# Patient Record
Sex: Male | Born: 1935
Health system: Southern US, Community
[De-identification: ages and names within clinical notes are randomized; demographics above are authoritative.]

## PROBLEM LIST (undated history)

## (undated) DIAGNOSIS — I519 Heart disease, unspecified: Secondary | ICD-10-CM

## (undated) DIAGNOSIS — J189 Pneumonia, unspecified organism: Secondary | ICD-10-CM

## (undated) DIAGNOSIS — E0590 Thyrotoxicosis, unspecified without thyrotoxic crisis or storm: Secondary | ICD-10-CM

## (undated) DIAGNOSIS — K219 Gastro-esophageal reflux disease without esophagitis: Secondary | ICD-10-CM

## (undated) DIAGNOSIS — R001 Bradycardia, unspecified: Secondary | ICD-10-CM

## (undated) DIAGNOSIS — M1990 Unspecified osteoarthritis, unspecified site: Secondary | ICD-10-CM

## (undated) DIAGNOSIS — E119 Type 2 diabetes mellitus without complications: Secondary | ICD-10-CM

## (undated) DIAGNOSIS — I803 Phlebitis and thrombophlebitis of lower extremities, unspecified: Secondary | ICD-10-CM

## (undated) DIAGNOSIS — E1140 Type 2 diabetes mellitus with diabetic neuropathy, unspecified: Secondary | ICD-10-CM

## (undated) DIAGNOSIS — Z9289 Personal history of other medical treatment: Secondary | ICD-10-CM

## (undated) DIAGNOSIS — I350 Nonrheumatic aortic (valve) stenosis: Secondary | ICD-10-CM

## (undated) DIAGNOSIS — E785 Hyperlipidemia, unspecified: Secondary | ICD-10-CM

## (undated) DIAGNOSIS — I10 Essential (primary) hypertension: Secondary | ICD-10-CM

## (undated) DIAGNOSIS — R011 Cardiac murmur, unspecified: Secondary | ICD-10-CM

## (undated) DIAGNOSIS — I700 Atherosclerosis of aorta: Secondary | ICD-10-CM

## (undated) HISTORY — DX: Cardiac murmur, unspecified: R01.1

## (undated) HISTORY — DX: Essential (primary) hypertension: I10

## (undated) HISTORY — DX: Hyperlipidemia, unspecified: E78.5

## (undated) HISTORY — DX: Phlebitis and thrombophlebitis of lower extremities, unspecified: I80.3

## (undated) HISTORY — PX: FRACTURE SURGERY: SHX138

## (undated) HISTORY — DX: Thyrotoxicosis, unspecified without thyrotoxic crisis or storm: E05.90

## (undated) HISTORY — DX: Gastro-esophageal reflux disease without esophagitis: K21.9

## (undated) HISTORY — DX: Atherosclerosis of aorta: I70.0

## (undated) HISTORY — DX: Pneumonia, unspecified organism: J18.9

## (undated) HISTORY — DX: Nonrheumatic aortic (valve) stenosis: I35.0

## (undated) HISTORY — DX: Heart disease, unspecified: I51.9

## (undated) HISTORY — DX: Unspecified osteoarthritis, unspecified site: M19.90

## (undated) HISTORY — PX: CATARACT EXTRACTION: SUR2

## (undated) HISTORY — PX: CIRCUMCISION: SUR203

## (undated) HISTORY — DX: Personal history of other medical treatment: Z92.89

## (undated) HISTORY — DX: Bradycardia, unspecified: R00.1

## (undated) NOTE — *Deleted (*Deleted)
Progress Note  Patient Name: Jackson [name] Date of Encounter: 06/28/2020  Primary Cardiologist:  Sherryl Manges, MD  Subjective   ***  Inpatient Medications    Scheduled Meds: . aspirin EC  81 mg Oral Daily  . docusate sodium  100 mg Oral QHS  . fluticasone  2 spray Each Nare Daily  . gabapentin  300 mg Oral QHS  . insulin aspart  0-15 Units Subcutaneous TID WC  . insulin aspart  0-5 Units Subcutaneous QHS  . insulin glargine  15 Units Subcutaneous Q2200  . rosuvastatin  10 mg Oral QHS   Continuous Infusions: . azithromycin Stopped (06/27/20 1028)  . cefTRIAXone (ROCEPHIN)  IV Stopped (06/27/20 1002)   PRN Meds: albuterol, naphazoline-glycerin, ondansetron **OR** ondansetron (ZOFRAN) IV, perflutren lipid microspheres (DEFINITY) IV suspension, sodium chloride   Vital Signs    Vitals:   06/28/20 0030 06/28/20 0430 06/28/20 0828 06/28/20 0829  BP: (!) 143/70 (!) 143/70 (!) 146/40   Pulse: (!) 54 (!) 54 65   Resp:  16 (!) 21   Temp: 97.9 F (36.6 C) 97.9 F (36.6 C) 99.5 F (37.5 C) 98.1 F (36.7 C)  TempSrc: Oral Oral Oral Oral  SpO2: 94% 94% 92%   Weight:      Height:        Intake/Output Summary (Last 24 hours) at 06/28/2020 1056 Last data filed at 06/28/2020 1038 Gross per 24 hour  Intake 4814.23 ml  Output 500 ml  Net 4314.23 ml   Filed Weights   06/27/20 0935  Weight: 79.4 kg   Last Weight  Most recent update: 06/27/2020  9:36 AM   Weight  79.4 kg (175 lb)           Weight change:    Telemetry    *** - Personally Reviewed  ECG    *** - Personally Reviewed  Physical Exam   General: Well developed, well nourished, male appearing in no acute distress. Head: Normocephalic, atraumatic.  Neck: Supple without bruits, JVD***. Lungs:  Resp regular and unlabored, CTA. Heart: RRR***, S1, S2, no S3, S4, or murmur; no rub. Abdomen: Soft, non-tender, non-distended with normoactive bowel sounds. No hepatomegaly. No rebound/guarding. No obvious  abdominal masses. Extremities: No clubbing, cyanosis, ***edema. Distal pedal pulses are 2+ bilaterally. Neuro: Alert and oriented X 3. Moves all extremities spontaneously. Psych: Normal affect.  Labs    Hematology Recent Labs  Lab 06/27/20 0900 06/28/20 0457  WBC 9.4 11.9*  RBC 4.62 4.04*  HGB 14.4 12.8*  HCT 43.1 39.2  MCV 93.3 97.0  MCH 31.2 31.7  MCHC 33.4 32.7  RDW 12.8 12.9  PLT 214 181    Chemistry Recent Labs  Lab 06/27/20 0900 06/28/20 0457  NA 141 139  K 3.8 3.6  CL 101 100  CO2 27 28  GLUCOSE 219* 138*  BUN 14 14  CREATININE 1.02 0.96  CALCIUM 9.5 9.2  PROT 7.2 6.0*  ALBUMIN 3.9 3.2*  AST 20 15  ALT 18 14  ALKPHOS 69 48  BILITOT 0.9 0.8  GFRNONAA >60 >60  ANIONGAP 13 11     High Sensitivity Troponin:   Recent Labs  Lab 06/27/20 0900 06/27/20 1100 06/27/20 1505 06/27/20 1730  TROPONINIHS 80* 483* 657* 569*      BNPNo results for input(s): BNP, PROBNP in the last 168 hours.   DDimer No results for input(s): DDIMER in the last 168 hours.   Radiology    CT ANGIO CHEST PE W OR WO  CONTRAST  Result Date: 06/27/2020 CLINICAL DATA:  Several short of breath.  History of aspiration EXAM: CT ANGIOGRAPHY CHEST WITH CONTRAST TECHNIQUE: Multidetector CT imaging of the chest was performed using the standard protocol during bolus administration of intravenous contrast. Multiplanar CT image reconstructions and MIPs were obtained to evaluate the vascular anatomy. CONTRAST:  OMNIPAQUE IOHEXOL 350 MG/ML SOLN COMPARISON:  March 14, 2020 FINDINGS: Cardiovascular: Evaluation of the subsegmental pulmonary arteries is limited secondary to respiratory motion. No pulmonary embolism through the segmental pulmonary arteries. Heart is normal in size. Predominately LEFT-sided atherosclerotic calcifications of the coronary arteries. Moderate atherosclerotic calcifications of the aorta. No pericardial effusion. Calcifications of the aortic valve leaflets.  Mediastinum/Nodes: Moderate to large hiatal hernia, similar in comparison to prior. No suspicious axillary adenopathy. Similar appearance of mildly prominent mediastinal lymph nodes. Subcentimeter LEFT thyroid nodule. Lungs/Pleura: No pleural effusion or pneumothorax. There is centrilobular ground-glass nodularity and consolidative opacity throughout the RIGHT upper lobe, RIGHT middle lobe and RIGHT lower lobe in a perihilar predominant distribution. Resolution of previously seen contralateral centrilobular nodularity. Scattered pleural plaques. Scattered LEFT basilar atelectasis. Evaluation for fine parenchymal detail is limited secondary to extensive respiratory motion. Upper Abdomen: No acute abnormality. Musculoskeletal: Gynecomastia. Degenerative changes of the thoracic spine. Review of the MIP images confirms the above findings. IMPRESSION: 1. No evidence of pulmonary embolism through the segmental pulmonary arteries. 2. Multifocal centrilobular ground-glass nodularity and consolidative opacity throughout the RIGHT lung, most consistent with pneumonia. Recommend radiographic follow-up to resolution. 3. Moderate to large hiatal hernia. Aortic Atherosclerosis (ICD10-I70.0). Electronically Signed   By: Meda Klinefelter MD   On: 06/27/2020 13:23   DG Chest Port 1 View  Result Date: 06/27/2020 CLINICAL DATA:  Questionable sepsis, evaluate for abnormality EXAM: PORTABLE CHEST 1 VIEW COMPARISON:  03/14/2020 FINDINGS: Mild cardiomegaly. There is dense, heterogeneous airspace opacity of the right midlung, new compared to prior examination. The visualized skeletal structures are unremarkable. IMPRESSION: 1. There is dense, heterogeneous airspace opacity of the right midlung, new compared to prior examination, concerning for infection. Recommend follow-up radiographs in 6-8 weeks to ensure complete radiographic resolution. 2. Mild cardiomegaly. Electronically Signed   By: Lauralyn Primes M.D.   On: 06/27/2020 09:27      Cardiac Studies   ECHO:  Results pending  Patient Profile     26 y.o. male w/ hx 1st degree and 2nd degree Mobitz I heart block, CHB during acute illness w/ junctional bts (no PPM indicated), AS, DM, HTN,  low-risk MV 06/2018, DVT 09/2017, recurrent PNA, who was admitted 10/07 with PNA, cards saw for elevated troponin.  Assessment & Plan    1. Elevated troponin - in the setting of febrile illness, PNA w/ hypoxia - no ischemic sx - continue ASA, statin, no BB due to hx bradycardia - f/u on echo results  2. Bradycardia - has Wenkebach w/ long PR  Principal Problem:   Sepsis (HCC) Active Problems:   Elevated troponin    Melida Quitter , PA-C 10:56 AM 06/28/2020 Pager: [phone]

---

## 2002-12-11 ENCOUNTER — Ambulatory Visit (HOSPITAL_COMMUNITY): Admission: RE | Admit: 2002-12-11 | Discharge: 2002-12-11 | Payer: Self-pay | Admitting: Gastroenterology

## 2008-03-19 ENCOUNTER — Encounter: Admission: RE | Admit: 2008-03-19 | Discharge: 2008-03-19 | Payer: Self-pay | Admitting: Internal Medicine

## 2008-11-03 ENCOUNTER — Emergency Department (HOSPITAL_COMMUNITY): Admission: EM | Admit: 2008-11-03 | Discharge: 2008-11-03 | Payer: Self-pay | Admitting: Family Medicine

## 2008-11-30 ENCOUNTER — Ambulatory Visit (HOSPITAL_BASED_OUTPATIENT_CLINIC_OR_DEPARTMENT_OTHER): Admission: RE | Admit: 2008-11-30 | Discharge: 2008-11-30 | Payer: Self-pay | Admitting: Urology

## 2008-11-30 ENCOUNTER — Encounter (INDEPENDENT_AMBULATORY_CARE_PROVIDER_SITE_OTHER): Payer: Self-pay | Admitting: Urology

## 2010-07-26 ENCOUNTER — Emergency Department (HOSPITAL_COMMUNITY): Admission: EM | Admit: 2010-07-26 | Discharge: 2010-07-27 | Payer: Self-pay | Admitting: Emergency Medicine

## 2010-12-02 LAB — DIFFERENTIAL
Basophils Absolute: 0 10*3/uL (ref 0.0–0.1)
Basophils Relative: 0 % (ref 0–1)
Eosinophils Absolute: 0.1 10*3/uL (ref 0.0–0.7)
Monocytes Absolute: 0.7 10*3/uL (ref 0.1–1.0)
Monocytes Relative: 5 % (ref 3–12)
Neutrophils Relative %: 92 % — ABNORMAL HIGH (ref 43–77)

## 2010-12-02 LAB — URINE CULTURE
Colony Count: NO GROWTH
Culture: NO GROWTH

## 2010-12-02 LAB — URINALYSIS, ROUTINE W REFLEX MICROSCOPIC
Glucose, UA: NEGATIVE mg/dL
Hgb urine dipstick: NEGATIVE
Protein, ur: NEGATIVE mg/dL
Specific Gravity, Urine: 1.019 (ref 1.005–1.030)
pH: 5.5 (ref 5.0–8.0)

## 2010-12-02 LAB — BASIC METABOLIC PANEL
BUN: 17 mg/dL (ref 6–23)
CO2: 26 mEq/L (ref 19–32)
Calcium: 9.1 mg/dL (ref 8.4–10.5)
Glucose, Bld: 222 mg/dL — ABNORMAL HIGH (ref 70–99)
Sodium: 135 mEq/L (ref 135–145)

## 2010-12-02 LAB — CBC
HCT: 39.3 % (ref 39.0–52.0)
Hemoglobin: 13.4 g/dL (ref 13.0–17.0)
MCH: 30.5 pg (ref 26.0–34.0)
MCHC: 34.1 g/dL (ref 30.0–36.0)
RDW: 12.3 % (ref 11.5–15.5)

## 2011-01-01 LAB — BASIC METABOLIC PANEL
BUN: 17 mg/dL (ref 6–23)
Calcium: 9.4 mg/dL (ref 8.4–10.5)
GFR calc non Af Amer: 60 mL/min — ABNORMAL LOW (ref >60)
Glucose, Bld: 256 mg/dL — ABNORMAL HIGH (ref 70–99)

## 2011-01-06 LAB — POCT URINALYSIS DIP (DEVICE)
Protein, ur: NEGATIVE mg/dL
Specific Gravity, Urine: <=1.005 (ref 1.005–1.030)
Urobilinogen, UA: 0.2 mg/dL (ref 0.0–1.0)
pH: 6.5 (ref 5.0–8.0)

## 2011-02-03 NOTE — Op Note (Signed)
NAMEFAY, SWIDER               ACCOUNT NO.:  [id number]   MEDICAL RECORD NO.:  [id number]          PATIENT TYPE:  AMB   LOCATION:  [facility]                         FACILITY:  [facility]   PHYSICIAN:  Ronald L. Earlene Plater, M.D.  DATE OF BIRTH:  [birth year]   DATE OF PROCEDURE:  11/30/2008  DATE OF DISCHARGE:                               OPERATIVE REPORT   PREOPERATIVE DIAGNOSIS:  The patient has a diagnosis balanitis and  phimosis.   POSTOPERATIVE DIAGNOSIS:  The patient has a diagnosis balanitis and  phimosis.   OPERATIVE PROCEDURE:  Circumcision.   SURGEON:  Lucrezia Starch. Earlene Plater, M.D.   ANESTHESIA:  LMA.   BLOOD LOSS:  Negligible to none.   COMPLICATIONS:  None.   PROCEDURE:  The patient is a very nice 77 year old white male who  presented with significant balanitis and phimosis.  He is  diabetic.  After understanding risks, benefits and alternatives, elected proceed  with circumcision.  He has been cleared for surgery by his medical  doctor and wishes to proceed accordingly.   DESCRIPTION OF PROCEDURE:  The patient was placed in the supine  position.  After proper LMA anesthesia, was prepped and draped with  Betadine in a sterile fashion.  The dorsal slit was performed and the  glans and the preputial area were reprepped with Betadine solution.  A  circumferential incision made at the shaft skin down to the corpus  spongiosum and Buck's fascia.  A separate circumferential incision was  made approximately 2 mm proximal to the corona aerata and extended to  the same levels.  The foreskin was carefully excised utilizing both  blunt and sharp dissection and vessels were cauterized with Bovie  coagulation cautery.  The area was irrigated thoroughly and the shaft  skin was approximated to the mucosa.  A dorsal stitch was placed with 3-  0 chromic catgut and a ventral U-type stitch was placed also with 3-0  chromic catgut and each side was run with a running type suture.  Following this,  good hemostasis was present.  The area was washed, was  dressed with Vaseline gauze, 4x4 sponge and Coban.  A penile block was  then performed in the usual manner with 10 mL of 0.25% Marcaine.  The  patient tolerated the procedure well.  No complications.  He was taken  to the recovery room stable.      Ronald L. Earlene Plater, M.D.  Electronically Signed     RLD/MEDQ  D:  11/30/2008  T:  11/30/2008  Job:  [id number]

## 2011-02-06 NOTE — Op Note (Signed)
   NAME:  Jackson [name], Jackson [name]                         ACCOUNT NO.:  [id number]   MEDICAL RECORD NO.:  [id number]                   PATIENT TYPE:  AMB   LOCATION:  [facility]                                 FACILITY:  [facility]   PHYSICIAN:  Danise Edge, M.D.                DATE OF BIRTH:  Oct 21, 1928   DATE OF PROCEDURE:  12/11/2002  DATE OF DISCHARGE:                                 OPERATIVE REPORT   PROCEDURE:  Screening colonoscopy.   INDICATIONS FOR PROCEDURE:  Mr. Jackson [name] is a 77 year old male born  1929/04/03. Mr. Jackson [name] is scheduled to undergo his first screening  colonoscopy with polypectomy to prevent colon cancer.   ENDOSCOPIST:  Charolett Bumpers, M.D.   PREMEDICATION:  Versed 5 mg, Demerol 50 mg .   ENDOSCOPE:  Olympus adult colonoscope.   DESCRIPTION OF PROCEDURE:  After obtaining informed consent, Jackson [name] was  placed in the left lateral decubitus position. I administered intravenous  Demerol and intravenous Versed to achieve conscious sedation for the  procedure. The patient's blood pressure, oxygen saturation and cardiac  rhythm were monitored throughout the procedure and documented in the medical  record.   Anal inspection was normal. Digital rectal exam was normal. The Olympus  colonoscope was introduced into the rectum and advanced to the cecum.  Colonic preparation for the exam today was excellent.   RECTUM:  Normal.   SIGMOID COLON AND DESCENDING COLON:  Normal.   SPLENIC FLEXURE:  Normal.   TRANSVERSE COLON:  Normal.   HEPATIC FLEXURE:  Normal.   ASCENDING COLON:  Normal.   CECUM AND ILEOCECAL VALVE:  Normal.    ASSESSMENT:  Normal screening proctocolonoscopy to the cecum. No endoscopic  evidence for the presence of colorectal neoplasia.                                               Danise Edge, M.D.    MJ/MEDQ  D:  12/11/2002  T:  12/11/2002  Job:  [id number]   cc:   Theressa Millard, M.D.  [address] [place]  [place]  [address]  Fax: [phone]

## 2013-11-23 ENCOUNTER — Emergency Department (HOSPITAL_COMMUNITY)
Admission: EM | Admit: 2013-11-23 | Discharge: 2013-11-23 | Disposition: A | Discharge status: Home / Self Care | Payer: Medicare Other | Attending: Emergency Medicine | Admitting: Emergency Medicine

## 2013-11-23 ENCOUNTER — Emergency Department (HOSPITAL_COMMUNITY): Payer: Medicare Other

## 2013-11-23 ENCOUNTER — Encounter (HOSPITAL_COMMUNITY): Payer: Self-pay | Admitting: Emergency Medicine

## 2013-11-23 DIAGNOSIS — E1149 Type 2 diabetes mellitus with other diabetic neurological complication: Secondary | ICD-10-CM | POA: Insufficient documentation

## 2013-11-23 DIAGNOSIS — R509 Fever, unspecified: Secondary | ICD-10-CM | POA: Insufficient documentation

## 2013-11-23 DIAGNOSIS — J189 Pneumonia, unspecified organism: Secondary | ICD-10-CM | POA: Insufficient documentation

## 2013-11-23 DIAGNOSIS — F29 Unspecified psychosis not due to a substance or known physiological condition: Secondary | ICD-10-CM | POA: Insufficient documentation

## 2013-11-23 DIAGNOSIS — Z87891 Personal history of nicotine dependence: Secondary | ICD-10-CM | POA: Insufficient documentation

## 2013-11-23 DIAGNOSIS — E1142 Type 2 diabetes mellitus with diabetic polyneuropathy: Secondary | ICD-10-CM | POA: Insufficient documentation

## 2013-11-23 DIAGNOSIS — M129 Arthropathy, unspecified: Secondary | ICD-10-CM | POA: Insufficient documentation

## 2013-11-23 DIAGNOSIS — Z79899 Other long term (current) drug therapy: Secondary | ICD-10-CM | POA: Insufficient documentation

## 2013-11-23 DIAGNOSIS — IMO0001 Reserved for inherently not codable concepts without codable children: Secondary | ICD-10-CM | POA: Insufficient documentation

## 2013-11-23 HISTORY — DX: Type 2 diabetes mellitus with diabetic neuropathy, unspecified: E11.40

## 2013-11-23 HISTORY — DX: Type 2 diabetes mellitus without complications: E11.9

## 2013-11-23 LAB — CBC WITH DIFFERENTIAL/PLATELET
BASOS ABS: 0 10*3/uL (ref 0.0–0.1)
Basophils Relative: 0 % (ref 0–1)
Eosinophils Absolute: 0.1 10*3/uL (ref 0.0–0.7)
Eosinophils Relative: 1 % (ref 0–5)
HEMATOCRIT: 40.7 % (ref 39.0–52.0)
HEMOGLOBIN: 14.7 g/dL (ref 13.0–17.0)
LYMPHS PCT: 8 % — AB (ref 12–46)
Lymphs Abs: 0.9 10*3/uL (ref 0.7–4.0)
MCH: 32.7 pg (ref 26.0–34.0)
MCHC: 36.1 g/dL — ABNORMAL HIGH (ref 30.0–36.0)
MCV: 90.4 fL (ref 78.0–100.0)
MONO ABS: 0.6 10*3/uL (ref 0.1–1.0)
MONOS PCT: 5 % (ref 3–12)
NEUTROS ABS: 10.1 10*3/uL — AB (ref 1.7–7.7)
NEUTROS PCT: 86 % — AB (ref 43–77)
Platelets: 201 10*3/uL (ref 150–400)
RBC: 4.5 MIL/uL (ref 4.22–5.81)
RDW: 12.6 % (ref 11.5–15.5)
WBC: 11.7 10*3/uL — AB (ref 4.0–10.5)

## 2013-11-23 LAB — URINALYSIS, ROUTINE W REFLEX MICROSCOPIC
BILIRUBIN URINE: NEGATIVE
Glucose, UA: 250 mg/dL — AB
HGB URINE DIPSTICK: NEGATIVE
Ketones, ur: NEGATIVE mg/dL
Leukocytes, UA: NEGATIVE
NITRITE: NEGATIVE
PH: 6.5 (ref 5.0–8.0)
Protein, ur: NEGATIVE mg/dL
SPECIFIC GRAVITY, URINE: 1.021 (ref 1.005–1.030)
UROBILINOGEN UA: 0.2 mg/dL (ref 0.0–1.0)

## 2013-11-23 LAB — COMPREHENSIVE METABOLIC PANEL
ALBUMIN: 3.7 g/dL (ref 3.5–5.2)
ALK PHOS: 65 U/L (ref 39–117)
ALT: 13 U/L (ref 0–53)
AST: 12 U/L (ref 0–37)
BILIRUBIN TOTAL: 0.7 mg/dL (ref 0.3–1.2)
BUN: 18 mg/dL (ref 6–23)
CHLORIDE: 98 meq/L (ref 96–112)
CO2: 26 mEq/L (ref 19–32)
Calcium: 9.7 mg/dL (ref 8.4–10.5)
Creatinine, Ser: 1.21 mg/dL (ref 0.50–1.35)
GFR calc Af Amer: 62 mL/min — ABNORMAL LOW (ref >90)
GFR calc non Af Amer: 54 mL/min — ABNORMAL LOW (ref >90)
Glucose, Bld: 265 mg/dL — ABNORMAL HIGH (ref 70–99)
POTASSIUM: 4.1 meq/L (ref 3.7–5.3)
SODIUM: 139 meq/L (ref 137–147)
TOTAL PROTEIN: 6.9 g/dL (ref 6.0–8.3)

## 2013-11-23 LAB — LIPASE, BLOOD: LIPASE: 19 U/L (ref 11–59)

## 2013-11-23 MED ORDER — LEVOFLOXACIN 750 MG PO TABS
750.0000 mg | ORAL_TABLET | Freq: Once | ORAL | Status: AC
  Administered 2013-11-23: 750 mg via ORAL
  Filled 2013-11-23: qty 1

## 2013-11-23 MED ORDER — ONDANSETRON HCL 4 MG/2ML IJ SOLN
4.0000 mg | Freq: Once | INTRAMUSCULAR | Status: AC
  Administered 2013-11-23: 4 mg via INTRAVENOUS
  Filled 2013-11-23: qty 2

## 2013-11-23 MED ORDER — LEVOFLOXACIN 500 MG PO TABS: 500.0000 mg | ORAL_TABLET | Freq: Every day | ORAL | Status: DC

## 2013-11-23 MED ORDER — SODIUM CHLORIDE 0.9 % IV BOLUS (SEPSIS)
1000.0000 mL | Freq: Once | INTRAVENOUS | Status: AC
Start: 2013-11-23 — End: 2013-11-23
  Administered 2013-11-23: 1000 mL via INTRAVENOUS

## 2013-11-23 NOTE — ED Provider Notes (Signed)
CSN: [id number]     Arrival date & time 11/23/13  0701 History   First MD Initiated Contact with Patient 11/23/13 [phone]     Chief Complaint  Patient presents with  . Emesis     (Consider location/radiation/quality/duration/timing/severity/associated sxs/prior Treatment) HPI  This is an 80 year old male with a history of diabetes who presents with nausea and chills. Patient states that he woke up at approximately 4 AM this morning "shaking all over." He states that he went to the bathroom and had one episode of nonbilious, nonbloody emesis. He reports nausea. He denies any abdominal pain, chest pain, or shortness of breath. Patient's wife states that he was disoriented during this time. He has not taken Motrin or Tylenol.  Patient states that he was feeling well before he went to bed last night. He did get a flu shot this year. No known sick contacts. Per EMS, patient was 91% on room air, 97 percent on 2 L nasal cannula. Temperature noted to be 101.1 in triage. Patient denies any headache, neck stiffness, urinary symptoms, focal weakness or numbness.  Past Medical History  Diagnosis Date  . Diabetes mellitus without complication   . Arthritis   . Diabetic neuropathy    History reviewed. No pertinent past surgical history. No family history on file. History  Substance Use Topics  . Smoking status: Former Games developermoker  . Smokeless tobacco: Not on file  . Alcohol Use: No    Review of Systems  Constitutional: Positive for fever and chills.  Respiratory: Negative.  Negative for cough, chest tightness and shortness of breath.   Cardiovascular: Negative.  Negative for chest pain and leg swelling.  Gastrointestinal: Positive for nausea and vomiting. Negative for abdominal pain, diarrhea and blood in stool.  Genitourinary: Negative.  Negative for dysuria.  Musculoskeletal: Positive for myalgias. Negative for back pain.  Skin: Negative for rash.  Neurological: Negative for headaches.   Psychiatric/Behavioral: Positive for confusion.  All other systems reviewed and are negative.      Allergies  Review of patient's allergies indicates no known allergies.  Home Medications   Current Outpatient Rx  Name  Route  Sig  Dispense  Refill  . Cholecalciferol (VITAMIN D PO)   Oral   Take 1 capsule by mouth daily.         Marland Kitchen. gabapentin (NEURONTIN) 100 MG capsule   Oral   Take 100-200 mg by mouth 2 (two) times daily. Takes 1 capsule every morning and takes 2 capsules every night at bedtime.         Marland Kitchen. glimepiride (AMARYL) 4 MG tablet   Oral   Take 8 mg by mouth daily with breakfast.         . naproxen sodium (ANAPROX) 220 MG tablet   Oral   Take 440 mg by mouth every morning.         . simvastatin (ZOCOR) 40 MG tablet   Oral   Take 40 mg by mouth daily.         . sitaGLIPtin (JANUVIA) 100 MG tablet   Oral   Take 100 mg by mouth at bedtime.         Marland Kitchen. levofloxacin (LEVAQUIN) 500 MG tablet   Oral   Take 1 tablet (500 mg total) by mouth daily.   6 tablet   0    BP 134/67  Pulse 93  Temp(Src) 100 F (37.8 C) (Oral)  Resp 18  SpO2 94% Physical Exam  Nursing note and vitals reviewed.  Constitutional: He is oriented to person, place, and time. No distress.  Ill-appearing, nontoxic  HENT:  Head: Normocephalic and atraumatic.  Mouth/Throat: Oropharynx is clear and moist. No oropharyngeal exudate.  Eyes: Pupils are equal, round, and reactive to light.  Neck: Neck supple. No JVD present.  Cardiovascular: Normal rate, regular rhythm and normal heart sounds.   No murmur heard. Pulmonary/Chest: Effort normal and breath sounds normal. No respiratory distress. He has no wheezes. He has no rales.  Abdominal: Soft. Bowel sounds are normal. He exhibits no distension. There is no tenderness. There is no rebound and no guarding.  Musculoskeletal: He exhibits no edema.  Lymphadenopathy:    He has no cervical adenopathy.  Neurological: He is alert and  oriented to person, place, and time.  Skin: Skin is warm and dry. No rash noted.  Psychiatric: He has a normal mood and affect.    ED Course  Procedures (including critical care time) Labs Review Labs Reviewed  CBC WITH DIFFERENTIAL - Abnormal; Notable for the following:    WBC 11.7 (*)    MCHC 36.1 (*)    Neutrophils Relative % 86 (*)    Neutro Abs 10.1 (*)    Lymphocytes Relative 8 (*)    All other components within normal limits  URINALYSIS, ROUTINE W REFLEX MICROSCOPIC - Abnormal; Notable for the following:    Glucose, UA 250 (*)    All other components within normal limits  COMPREHENSIVE METABOLIC PANEL - Abnormal; Notable for the following:    Glucose, Bld 265 (*)    GFR calc non Af Amer 54 (*)    GFR calc Af Amer 62 (*)    All other components within normal limits  LIPASE, BLOOD   Imaging Review Dg Chest 2 View  11/23/2013   CLINICAL DATA:  Vomiting and 4 a.m.  Chills, fever  EXAM: CHEST  2 VIEW  COMPARISON:  None.  FINDINGS: There is eventration of the right diaphragm. There is lingular airspace disease. There is no pleural effusion or pneumothorax. The heart and mediastinal contours are unremarkable.  There is mild thoracic spine spondylosis.  IMPRESSION: Lingular pneumonia. Recommend followup radiography in 4-6 weeks, to document complete resolution following adequate medical therapy. If there is not complete resolution, then recommend further evaluation with CT of the chest to exclude underlying pathology.   Electronically Signed   By: Elige Ko   On: 11/23/2013 07:49     EKG Interpretation None     EKG independently reviewed by myself: Normal sinus rhythm with a rate of 85, no evidence of ST elevation or acute ischemia MDM   Final diagnoses:  Community acquired pneumonia    Patient presents with emesis and chills. He is nontoxic on exam. Initial vital signs in triage notable for room air O2 sats of 91%; however, patient is satting 95-96% on room air while in  the room. His exam is benign. Workup notable for lingular pneumonia. Patient has no risk factors for hospital-acquired pneumonia. Patient was given Levaquin. He was also given fluids and reports "I feel much better."  White count mildly elevated at 11.7. Patient was ambulated on room air and maintained oxygen saturations greater than 94%. I feel a trial of outpatient management for community acquired pneumonia is reasonable. Patient will be discharged on Levaquin. He is to followup with his primary physician.  After history, exam, and medical workup I feel the patient has been appropriately medically screened and is safe for discharge home. Pertinent diagnoses were discussed  with the patient. Patient was given return precautions.     Shon Baton, MD 11/23/13 [phone]

## 2013-11-23 NOTE — ED Notes (Signed)
Pt undressed, in gown, on monitor, continuous pulse oximetry and blood pressure cuff 

## 2013-11-23 NOTE — ED Notes (Signed)
PER EMS: pt from home, pt reports he woke up this morning at 4am shaking and trouble walking, pt reports vomiting x1, nausea.  BP-134/68, HR-100, O2-91% on RA and 97% 2L [place], lungs clear but diminished on right per EMS. RR-16, Temp 101.1. CBG-248.

## 2013-11-23 NOTE — ED Notes (Signed)
Patient ambulated independently with no problems.  Patient oxygen saturation maintained at 94-96%.  Patient has no other complaints at this time.

## 2013-11-23 NOTE — Discharge Instructions (Signed)

## 2013-12-25 ENCOUNTER — Ambulatory Visit
Admission: RE | Admit: 2013-12-25 | Discharge: 2013-12-25 | Disposition: A | Discharge status: Home / Self Care | Payer: Medicare Other | Source: Ambulatory Visit | Attending: Internal Medicine | Admitting: Internal Medicine

## 2013-12-25 ENCOUNTER — Other Ambulatory Visit: Payer: Self-pay | Admitting: Internal Medicine

## 2013-12-25 DIAGNOSIS — J189 Pneumonia, unspecified organism: Secondary | ICD-10-CM

## 2014-08-06 IMAGING — CR DG CHEST 2V
2 series · 2 of 2 positions shown · non-contrast
Comparison: 11/23/2013

CLINICAL DATA: Followup pneumonia

EXAM:
CHEST  2 VIEW

[w chest pa]
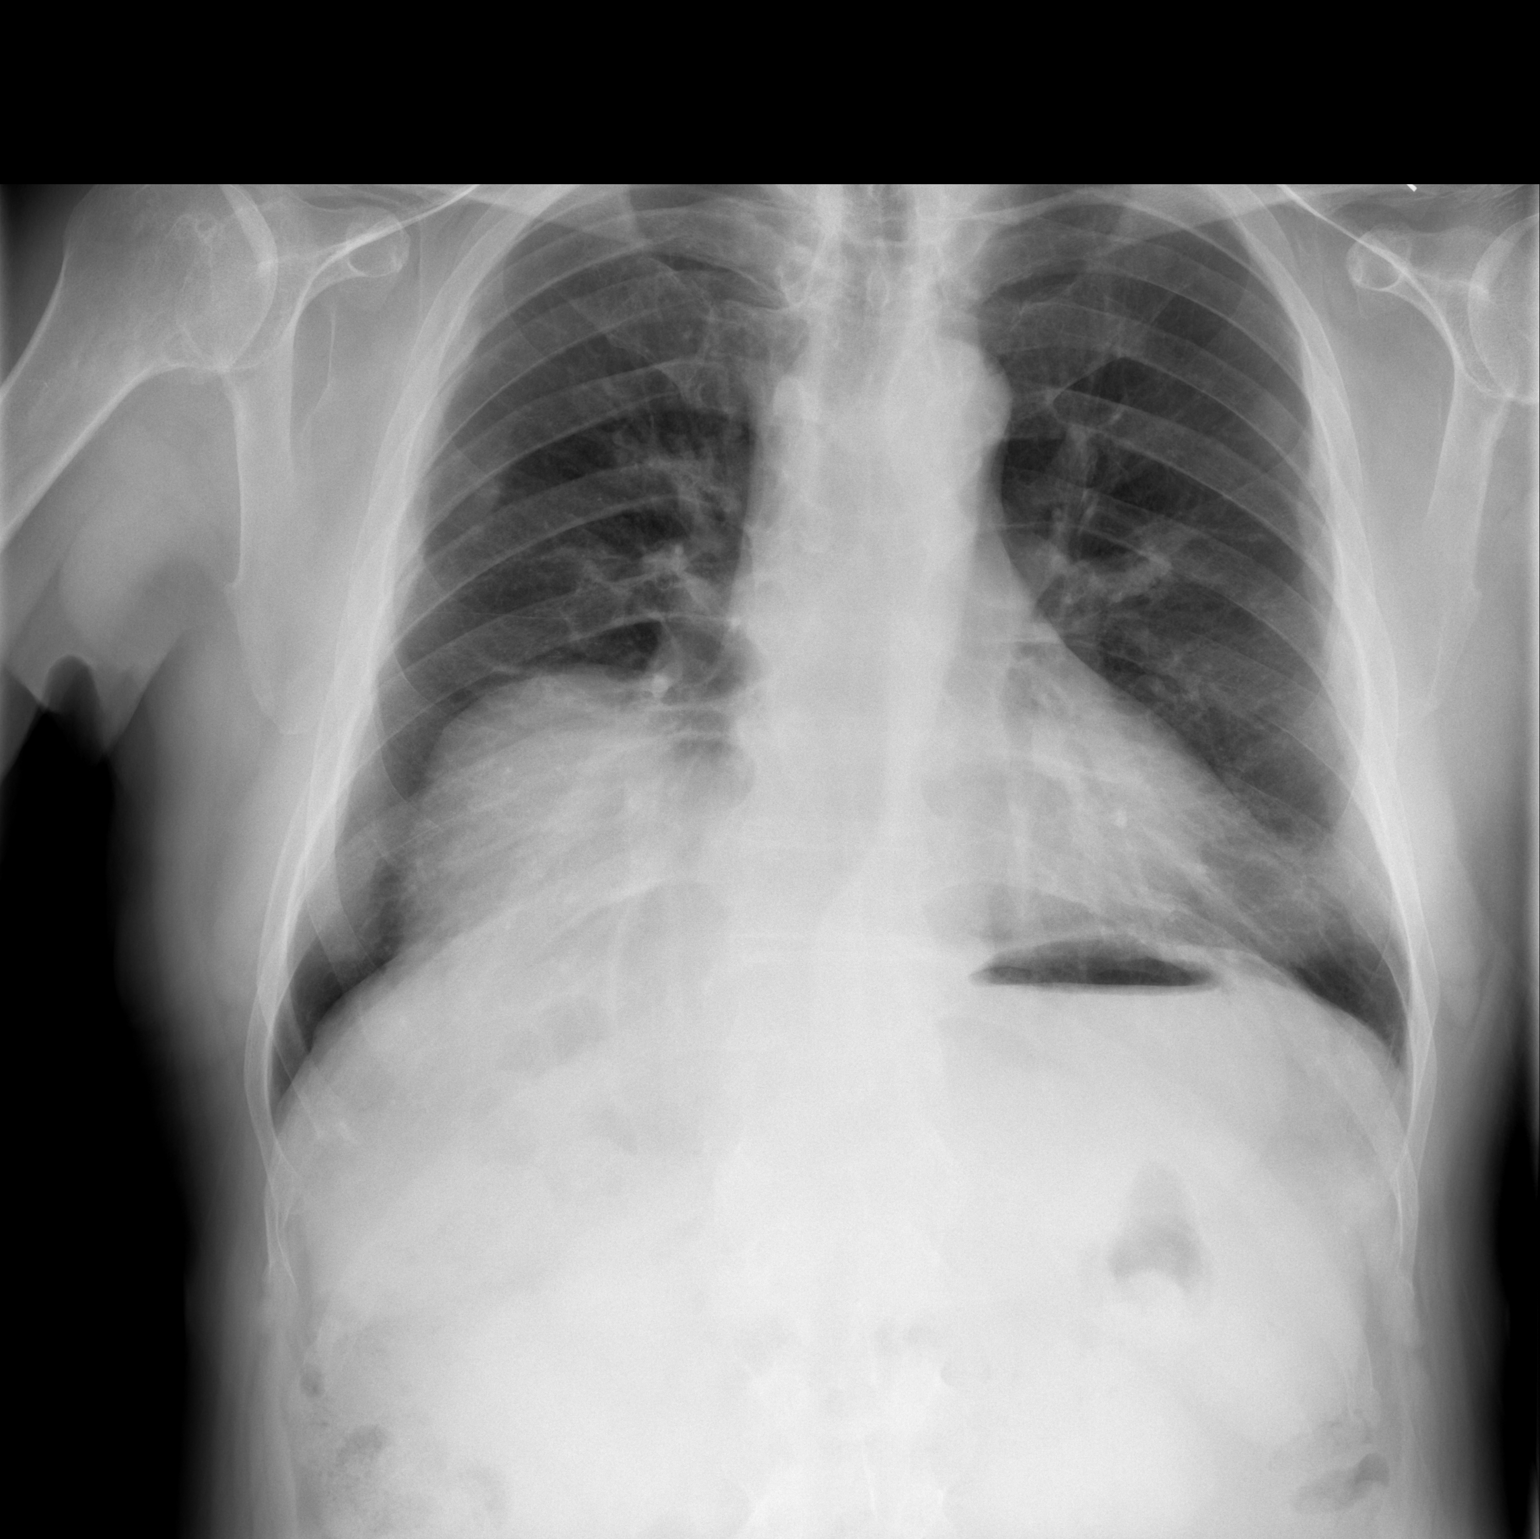

[w chest lat]
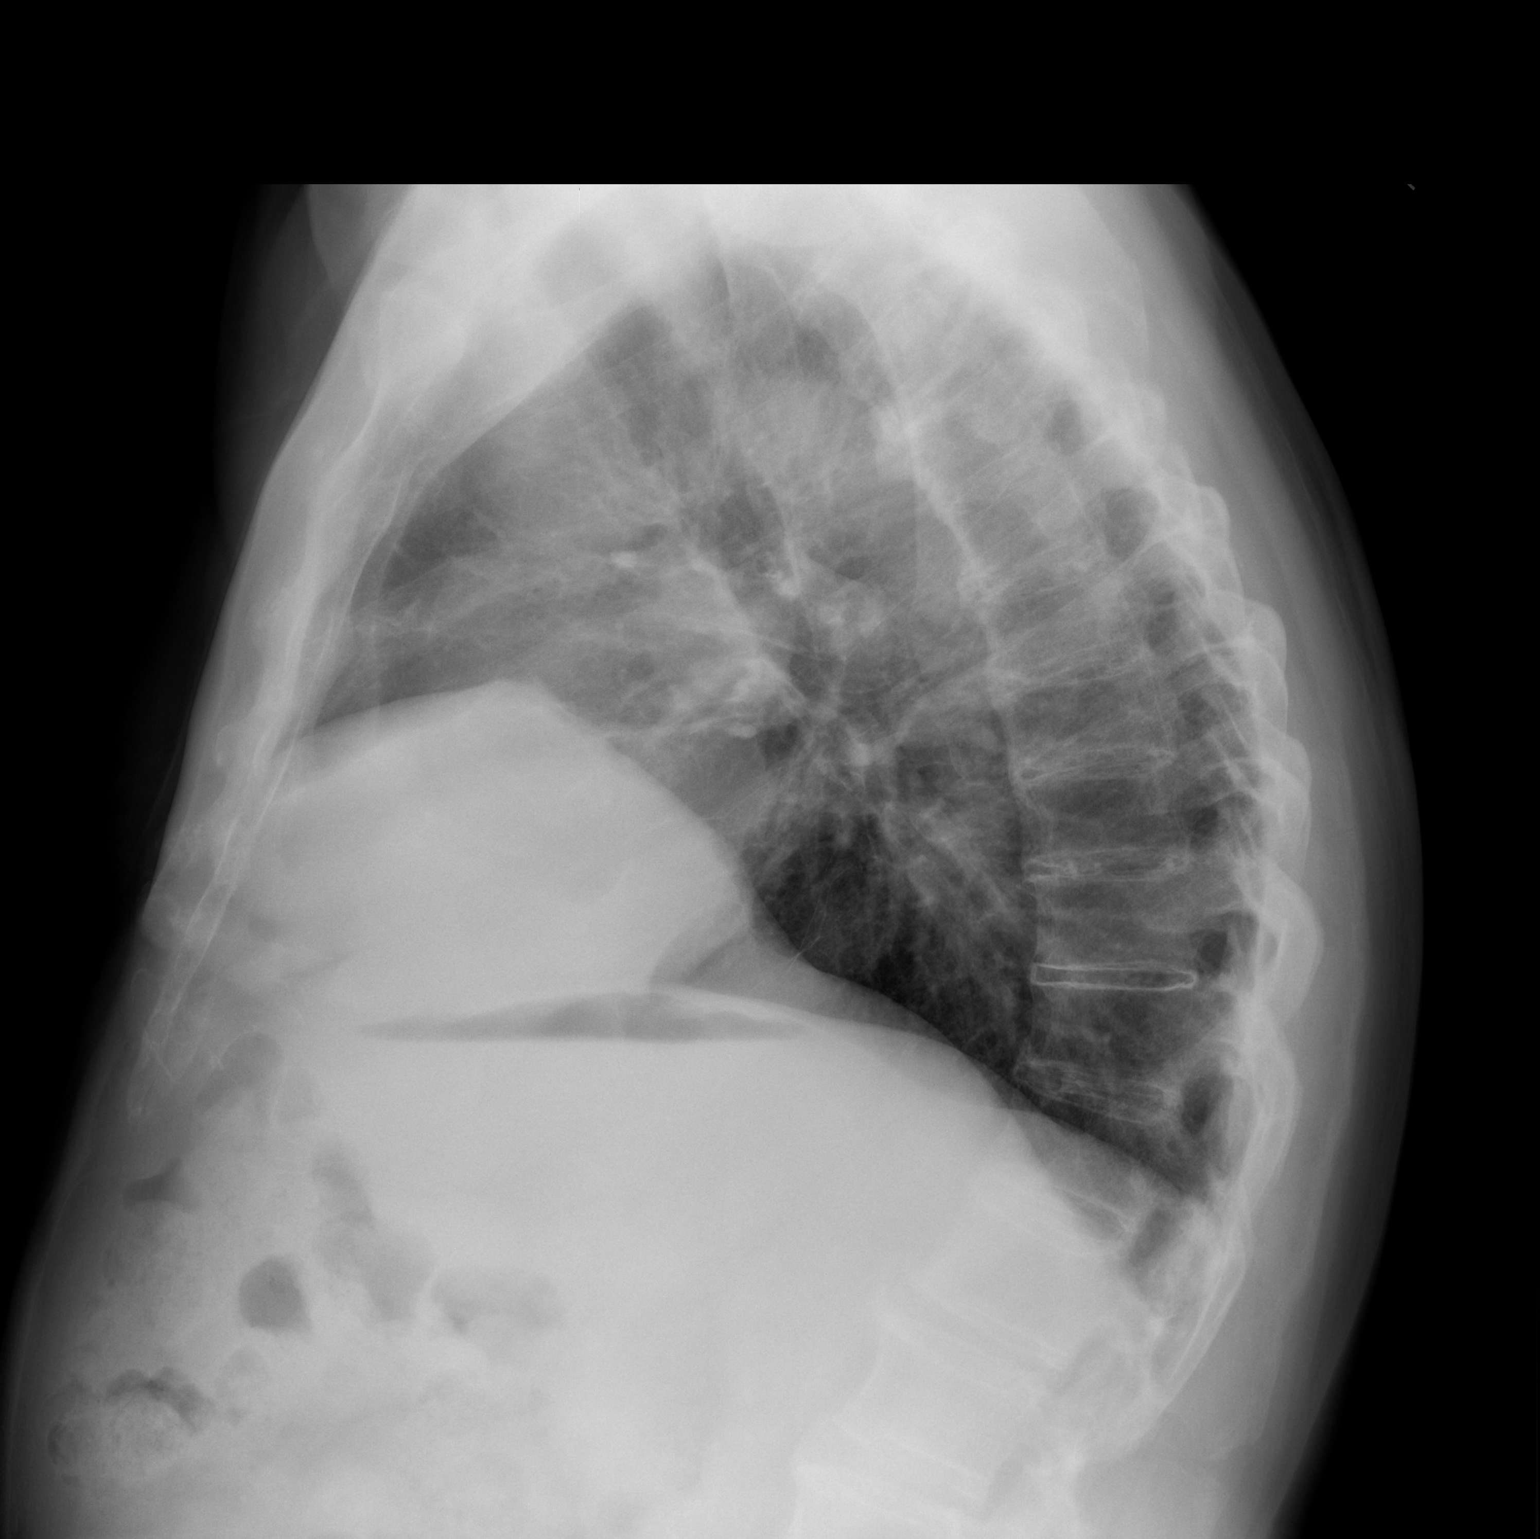

[2 of 2 positions shown; findings below may reference images not displayed]

FINDINGS: Interval clearing of lingular infiltrate consistent with pneumonia.
No underlying mass lesion. No effusion. Focal eventration of the
right hemidiaphragm anteriorly is unchanged. Negative for heart
failure.
IMPRESSION: Interval clearing of lingular pneumonia. Currently, no acute
abnormality is identified.

## 2014-09-11 ENCOUNTER — Ambulatory Visit
Admission: RE | Admit: 2014-09-11 | Discharge: 2014-09-11 | Disposition: A | Discharge status: Home / Self Care | Payer: Medicare Other | Source: Ambulatory Visit | Attending: Internal Medicine | Admitting: Internal Medicine

## 2014-09-11 ENCOUNTER — Other Ambulatory Visit: Payer: Self-pay | Admitting: Internal Medicine

## 2014-09-11 DIAGNOSIS — M25552 Pain in left hip: Secondary | ICD-10-CM

## 2015-02-20 DIAGNOSIS — J189 Pneumonia, unspecified organism: Secondary | ICD-10-CM

## 2015-02-20 HISTORY — DX: Pneumonia, unspecified organism: J18.9

## 2015-02-25 ENCOUNTER — Inpatient Hospital Stay (HOSPITAL_COMMUNITY)
Admission: EM | Admit: 2015-02-25 | Discharge: 2015-02-28 | DRG: 871 | Disposition: A | Discharge status: Home Health | Payer: Medicare Other | Attending: Internal Medicine | Admitting: Internal Medicine

## 2015-02-25 ENCOUNTER — Emergency Department (HOSPITAL_COMMUNITY): Payer: Medicare Other

## 2015-02-25 ENCOUNTER — Encounter (HOSPITAL_COMMUNITY): Payer: Self-pay | Admitting: *Deleted

## 2015-02-25 DIAGNOSIS — J9601 Acute respiratory failure with hypoxia: Secondary | ICD-10-CM | POA: Diagnosis present

## 2015-02-25 DIAGNOSIS — R001 Bradycardia, unspecified: Secondary | ICD-10-CM | POA: Diagnosis present

## 2015-02-25 DIAGNOSIS — Z66 Do not resuscitate: Secondary | ICD-10-CM | POA: Diagnosis present

## 2015-02-25 DIAGNOSIS — J189 Pneumonia, unspecified organism: Secondary | ICD-10-CM | POA: Diagnosis present

## 2015-02-25 DIAGNOSIS — E1165 Type 2 diabetes mellitus with hyperglycemia: Secondary | ICD-10-CM | POA: Diagnosis present

## 2015-02-25 DIAGNOSIS — R197 Diarrhea, unspecified: Secondary | ICD-10-CM | POA: Diagnosis present

## 2015-02-25 DIAGNOSIS — K219 Gastro-esophageal reflux disease without esophagitis: Secondary | ICD-10-CM | POA: Diagnosis present

## 2015-02-25 DIAGNOSIS — J811 Chronic pulmonary edema: Secondary | ICD-10-CM | POA: Diagnosis present

## 2015-02-25 DIAGNOSIS — A419 Sepsis, unspecified organism: Secondary | ICD-10-CM | POA: Diagnosis not present

## 2015-02-25 DIAGNOSIS — E872 Acidosis, unspecified: Secondary | ICD-10-CM | POA: Diagnosis present

## 2015-02-25 DIAGNOSIS — R0902 Hypoxemia: Secondary | ICD-10-CM

## 2015-02-25 DIAGNOSIS — Z87891 Personal history of nicotine dependence: Secondary | ICD-10-CM

## 2015-02-25 DIAGNOSIS — E1140 Type 2 diabetes mellitus with diabetic neuropathy, unspecified: Secondary | ICD-10-CM | POA: Diagnosis present

## 2015-02-25 DIAGNOSIS — J9600 Acute respiratory failure, unspecified whether with hypoxia or hypercapnia: Secondary | ICD-10-CM

## 2015-02-25 DIAGNOSIS — E119 Type 2 diabetes mellitus without complications: Secondary | ICD-10-CM | POA: Diagnosis not present

## 2015-02-25 DIAGNOSIS — J9621 Acute and chronic respiratory failure with hypoxia: Secondary | ICD-10-CM | POA: Insufficient documentation

## 2015-02-25 DIAGNOSIS — R0600 Dyspnea, unspecified: Secondary | ICD-10-CM | POA: Diagnosis not present

## 2015-02-25 DIAGNOSIS — M1990 Unspecified osteoarthritis, unspecified site: Secondary | ICD-10-CM | POA: Diagnosis present

## 2015-02-25 DIAGNOSIS — Z833 Family history of diabetes mellitus: Secondary | ICD-10-CM | POA: Diagnosis not present

## 2015-02-25 DIAGNOSIS — R112 Nausea with vomiting, unspecified: Secondary | ICD-10-CM | POA: Diagnosis present

## 2015-02-25 DIAGNOSIS — Z888 Allergy status to other drugs, medicaments and biological substances status: Secondary | ICD-10-CM | POA: Diagnosis not present

## 2015-02-25 DIAGNOSIS — Z79899 Other long term (current) drug therapy: Secondary | ICD-10-CM

## 2015-02-25 DIAGNOSIS — R0602 Shortness of breath: Secondary | ICD-10-CM | POA: Insufficient documentation

## 2015-02-25 DIAGNOSIS — E118 Type 2 diabetes mellitus with unspecified complications: Secondary | ICD-10-CM | POA: Diagnosis not present

## 2015-02-25 DIAGNOSIS — Z7982 Long term (current) use of aspirin: Secondary | ICD-10-CM

## 2015-02-25 DIAGNOSIS — R6520 Severe sepsis without septic shock: Secondary | ICD-10-CM | POA: Diagnosis present

## 2015-02-25 LAB — COMPREHENSIVE METABOLIC PANEL
ALBUMIN: 3.7 g/dL (ref 3.5–5.0)
ALK PHOS: 61 U/L (ref 38–126)
ALT: 22 U/L (ref 17–63)
AST: 22 U/L (ref 15–41)
Anion gap: 13 (ref 5–15)
BILIRUBIN TOTAL: 0.9 mg/dL (ref 0.3–1.2)
BUN: 19 mg/dL (ref 6–20)
CHLORIDE: 100 mmol/L — AB (ref 101–111)
CO2: 24 mmol/L (ref 22–32)
Calcium: 9.2 mg/dL (ref 8.9–10.3)
Creatinine, Ser: 1.24 mg/dL (ref 0.61–1.24)
GFR calc non Af Amer: 52 mL/min — ABNORMAL LOW (ref >60)
GFR, EST AFRICAN AMERICAN: 60 mL/min — AB (ref >60)
GLUCOSE: 221 mg/dL — AB (ref 65–99)
Potassium: 4.2 mmol/L (ref 3.5–5.1)
Sodium: 137 mmol/L (ref 135–145)
Total Protein: 6.7 g/dL (ref 6.5–8.1)

## 2015-02-25 LAB — CBC WITH DIFFERENTIAL/PLATELET
BASOS ABS: 0 10*3/uL (ref 0.0–0.1)
Basophils Relative: 0 % (ref 0–1)
EOS ABS: 0.1 10*3/uL (ref 0.0–0.7)
EOS PCT: 2 % (ref 0–5)
HEMATOCRIT: 42 % (ref 39.0–52.0)
HEMOGLOBIN: 14.9 g/dL (ref 13.0–17.0)
Lymphocytes Relative: 13 % (ref 12–46)
Lymphs Abs: 0.8 10*3/uL (ref 0.7–4.0)
MCH: 32.5 pg (ref 26.0–34.0)
MCHC: 35.5 g/dL (ref 30.0–36.0)
MCV: 91.5 fL (ref 78.0–100.0)
MONOS PCT: 7 % (ref 3–12)
Monocytes Absolute: 0.4 10*3/uL (ref 0.1–1.0)
Neutro Abs: 5.1 10*3/uL (ref 1.7–7.7)
Neutrophils Relative %: 78 % — ABNORMAL HIGH (ref 43–77)
Platelets: 180 10*3/uL (ref 150–400)
RBC: 4.59 MIL/uL (ref 4.22–5.81)
RDW: 12.7 % (ref 11.5–15.5)
WBC: 6.5 10*3/uL (ref 4.0–10.5)

## 2015-02-25 LAB — INFLUENZA PANEL BY PCR (TYPE A & B)
H1N1 flu by pcr: NOT DETECTED
INFLAPCR: NEGATIVE
Influenza B By PCR: NEGATIVE

## 2015-02-25 LAB — MRSA PCR SCREENING: MRSA by PCR: NEGATIVE

## 2015-02-25 LAB — STREP PNEUMONIAE URINARY ANTIGEN: Strep Pneumo Urinary Antigen: NEGATIVE

## 2015-02-25 LAB — GLUCOSE, CAPILLARY: Glucose-Capillary: 180 mg/dL — ABNORMAL HIGH (ref 65–99)

## 2015-02-25 LAB — URINALYSIS, ROUTINE W REFLEX MICROSCOPIC
Bilirubin Urine: NEGATIVE
Glucose, UA: 100 mg/dL — AB
Ketones, ur: NEGATIVE mg/dL
Leukocytes, UA: NEGATIVE
Nitrite: NEGATIVE
PH: 5 (ref 5.0–8.0)
PROTEIN: 30 mg/dL — AB
Specific Gravity, Urine: 1.012 (ref 1.005–1.030)
Urobilinogen, UA: 0.2 mg/dL (ref 0.0–1.0)

## 2015-02-25 LAB — URINE MICROSCOPIC-ADD ON

## 2015-02-25 LAB — LIPASE, BLOOD: Lipase: 17 U/L — ABNORMAL LOW (ref 22–51)

## 2015-02-25 LAB — PROCALCITONIN: PROCALCITONIN: 19.45 ng/mL

## 2015-02-25 LAB — I-STAT CG4 LACTIC ACID, ED
LACTIC ACID, VENOUS: 1.6 mmol/L (ref 0.5–2.0)
Lactic Acid, Venous: 3.05 mmol/L (ref 0.5–2.0)

## 2015-02-25 LAB — I-STAT TROPONIN, ED: Troponin i, poc: 0.02 ng/mL (ref 0.00–0.08)

## 2015-02-25 LAB — PROTIME-INR
INR: 1.01 (ref 0.00–1.49)
INR: 1.18 (ref 0.00–1.49)
Prothrombin Time: 13.5 seconds (ref 11.6–15.2)
Prothrombin Time: 15.2 seconds (ref 11.6–15.2)

## 2015-02-25 LAB — CBG MONITORING, ED: GLUCOSE-CAPILLARY: 224 mg/dL — AB (ref 65–99)

## 2015-02-25 LAB — APTT: aPTT: 29 seconds (ref 24–37)

## 2015-02-25 MED ORDER — GUAIFENESIN-DM 100-10 MG/5ML PO SYRP
5.0000 mL | ORAL_SOLUTION | ORAL | Status: DC | PRN
  Filled 2015-02-25: qty 5

## 2015-02-25 MED ORDER — SODIUM CHLORIDE 0.9 % IJ SOLN
3.0000 mL | Freq: Two times a day (BID) | INTRAMUSCULAR | Status: DC
  Administered 2015-02-25 – 2015-02-28 (×5): 3 mL via INTRAVENOUS

## 2015-02-25 MED ORDER — ACETAMINOPHEN 650 MG RE SUPP: 650.0000 mg | Freq: Four times a day (QID) | RECTAL | Status: DC | PRN

## 2015-02-25 MED ORDER — SODIUM CHLORIDE 0.9 % IV BOLUS (SEPSIS)
500.0000 mL | INTRAVENOUS | Status: AC
  Administered 2015-02-25: 500 mL via INTRAVENOUS

## 2015-02-25 MED ORDER — PROMETHAZINE HCL 25 MG PO TABS
12.5000 mg | ORAL_TABLET | Freq: Four times a day (QID) | ORAL | Status: DC | PRN
  Filled 2015-02-25: qty 1

## 2015-02-25 MED ORDER — SODIUM CHLORIDE 0.9 % IV SOLN: 250.0000 mL | INTRAVENOUS | Status: DC | PRN

## 2015-02-25 MED ORDER — ACETAMINOPHEN 325 MG PO TABS
650.0000 mg | ORAL_TABLET | Freq: Four times a day (QID) | ORAL | Status: DC | PRN
  Filled 2015-02-25: qty 2

## 2015-02-25 MED ORDER — PIPERACILLIN-TAZOBACTAM 3.375 G IVPB
3.3750 g | Freq: Three times a day (TID) | INTRAVENOUS | Status: DC
  Administered 2015-02-25 – 2015-02-27 (×6): 3.375 g via INTRAVENOUS
  Filled 2015-02-25 (×9): qty 50

## 2015-02-25 MED ORDER — SODIUM CHLORIDE 0.9 % IV SOLN
500.0000 mg | Freq: Once | INTRAVENOUS | Status: AC
  Administered 2015-02-25: 500 mg via INTRAVENOUS
  Filled 2015-02-25: qty 500

## 2015-02-25 MED ORDER — DICLOFENAC POTASSIUM 50 MG PO TABS
50.0000 mg | ORAL_TABLET | Freq: Two times a day (BID) | ORAL | Status: DC
  Administered 2015-02-25: 50 mg via ORAL
  Filled 2015-02-25 (×4): qty 1

## 2015-02-25 MED ORDER — NAPHAZOLINE-PHENIRAMINE 0.025-0.3 % OP SOLN
1.0000 [drp] | Freq: Four times a day (QID) | OPHTHALMIC | Status: DC | PRN
  Filled 2015-02-25: qty 5

## 2015-02-25 MED ORDER — SODIUM CHLORIDE 0.9 % IV SOLN
1000.0000 mL | INTRAVENOUS | Status: DC
  Administered 2015-02-25: 1000 mL via INTRAVENOUS

## 2015-02-25 MED ORDER — ASPIRIN EC 81 MG PO TBEC
81.0000 mg | DELAYED_RELEASE_TABLET | Freq: Every day | ORAL | Status: DC
  Administered 2015-02-25 – 2015-02-28 (×4): 81 mg via ORAL
  Filled 2015-02-25 (×4): qty 1

## 2015-02-25 MED ORDER — SIMVASTATIN 40 MG PO TABS
40.0000 mg | ORAL_TABLET | Freq: Every day | ORAL | Status: DC
  Administered 2015-02-25 – 2015-02-28 (×4): 40 mg via ORAL
  Filled 2015-02-25 (×4): qty 1

## 2015-02-25 MED ORDER — VANCOMYCIN HCL IN DEXTROSE 1-5 GM/200ML-% IV SOLN
1000.0000 mg | Freq: Once | INTRAVENOUS | Status: AC
  Administered 2015-02-25: 1000 mg via INTRAVENOUS
  Filled 2015-02-25: qty 200

## 2015-02-25 MED ORDER — ENOXAPARIN SODIUM 40 MG/0.4ML ~~LOC~~ SOLN
40.0000 mg | SUBCUTANEOUS | Status: DC
  Administered 2015-02-25 – 2015-02-27 (×3): 40 mg via SUBCUTANEOUS
  Filled 2015-02-25 (×4): qty 0.4

## 2015-02-25 MED ORDER — SODIUM CHLORIDE 0.9 % IJ SOLN: 3.0000 mL | INTRAMUSCULAR | Status: DC | PRN

## 2015-02-25 MED ORDER — GUAIFENESIN ER 600 MG PO TB12
1200.0000 mg | ORAL_TABLET | Freq: Two times a day (BID) | ORAL | Status: DC
  Administered 2015-02-25 – 2015-02-28 (×7): 1200 mg via ORAL
  Filled 2015-02-25 (×8): qty 2

## 2015-02-25 MED ORDER — INSULIN ASPART 100 UNIT/ML ~~LOC~~ SOLN
0.0000 [IU] | Freq: Three times a day (TID) | SUBCUTANEOUS | Status: DC
  Administered 2015-02-25: 3 [IU] via SUBCUTANEOUS
  Administered 2015-02-25: 5 [IU] via SUBCUTANEOUS
  Administered 2015-02-26 (×2): 3 [IU] via SUBCUTANEOUS
  Administered 2015-02-27 (×2): 5 [IU] via SUBCUTANEOUS
  Administered 2015-02-27 – 2015-02-28 (×2): 2 [IU] via SUBCUTANEOUS
  Administered 2015-02-28: 3 [IU] via SUBCUTANEOUS
  Filled 2015-02-25 (×2): qty 1

## 2015-02-25 MED ORDER — IPRATROPIUM-ALBUTEROL 0.5-2.5 (3) MG/3ML IN SOLN: 3.0000 mL | RESPIRATORY_TRACT | Status: DC | PRN

## 2015-02-25 MED ORDER — INSULIN GLARGINE 100 UNIT/ML ~~LOC~~ SOLN
10.0000 [IU] | Freq: Every day | SUBCUTANEOUS | Status: DC
Start: 2015-02-25 — End: 2015-02-28
  Administered 2015-02-25 – 2015-02-27 (×3): 10 [IU] via SUBCUTANEOUS
  Filled 2015-02-25 (×4): qty 0.1

## 2015-02-25 MED ORDER — SODIUM CHLORIDE 0.9 % IJ SOLN
3.0000 mL | Freq: Two times a day (BID) | INTRAMUSCULAR | Status: DC
  Administered 2015-02-25 – 2015-02-28 (×7): 3 mL via INTRAVENOUS
  Filled 2015-02-25: qty 3

## 2015-02-25 MED ORDER — SODIUM CHLORIDE 0.9 % IV BOLUS (SEPSIS)
1000.0000 mL | INTRAVENOUS | Status: AC
  Administered 2015-02-25: 1000 mL via INTRAVENOUS

## 2015-02-25 MED ORDER — ACETAMINOPHEN 500 MG PO TABS
1000.0000 mg | ORAL_TABLET | Freq: Once | ORAL | Status: AC
  Administered 2015-02-25: 1000 mg via ORAL
  Filled 2015-02-25: qty 2

## 2015-02-25 MED ORDER — ALUM & MAG HYDROXIDE-SIMETH 200-200-20 MG/5ML PO SUSP: 30.0000 mL | Freq: Four times a day (QID) | ORAL | Status: DC | PRN

## 2015-02-25 MED ORDER — FUROSEMIDE 10 MG/ML IJ SOLN: 40.0000 mg | Freq: Once | INTRAMUSCULAR | Status: DC

## 2015-02-25 MED ORDER — PIPERACILLIN-TAZOBACTAM 3.375 G IVPB 30 MIN
3.3750 g | Freq: Once | INTRAVENOUS | Status: AC
  Administered 2015-02-25: 3.375 g via INTRAVENOUS
  Filled 2015-02-25: qty 50

## 2015-02-25 MED ORDER — NAPHAZOLINE HCL 0.1 % OP SOLN
1.0000 [drp] | Freq: Four times a day (QID) | OPHTHALMIC | Status: DC | PRN
  Filled 2015-02-25: qty 15

## 2015-02-25 MED ORDER — VANCOMYCIN HCL 10 G IV SOLR
1500.0000 mg | INTRAVENOUS | Status: DC
  Administered 2015-02-26 – 2015-02-27 (×2): 1500 mg via INTRAVENOUS
  Filled 2015-02-25 (×4): qty 1500

## 2015-02-25 MED ORDER — GABAPENTIN 100 MG PO CAPS
100.0000 mg | ORAL_CAPSULE | Freq: Three times a day (TID) | ORAL | Status: DC
  Administered 2015-02-25 – 2015-02-28 (×10): 100 mg via ORAL
  Filled 2015-02-25 (×11): qty 1

## 2015-02-25 NOTE — Progress Notes (Signed)
Pt admitted to 2C15, pt is alert and orientated, no skin issues noted. MRSA and CHG completed. Daughter in law at bedside, bed alarm on pt.

## 2015-02-25 NOTE — ED Notes (Signed)
CBG-224. Notified RN

## 2015-02-25 NOTE — ED Notes (Signed)
CBG-179. Notified RN

## 2015-02-25 NOTE — ED Notes (Signed)
Meal delivered

## 2015-02-25 NOTE — ED Notes (Signed)
Secretary spoke with Assist for bed request, pt c/o stretcher being uncomfortable, awaiting hospital bed

## 2015-02-25 NOTE — ED Provider Notes (Signed)
CSN: [id number]     Arrival date & time 02/25/15  0540 History   First MD Initiated Contact with Patient 02/25/15 [phone]     Chief Complaint  Patient presents with  . Fever  . Nausea  . Emesis     (Consider location/radiation/quality/duration/timing/severity/associated sxs/prior Treatment) HPI  [place] [name] is a 81 y.o. male with PMH of DM presenting with "stomach upset" yesterday with diarrhea and woke up this morning with diaphoresis, vomiting x 6-8 times with blood tinged emesis. Denies alleviating or aggravating factors. Problem is constant and worse than yesterday. Pt denies cough. Pt with o2 stats of 85-88% en route and given non rebreather in ED with O2 stats above 92%. Pt with temperature. No pain. No chest pain, abdominal, pain, back pain. Pt with SOB. Pt denies recent hospitalizations, abx. Pt lives at home. Pt with CAP on year ago.   Past Medical History  Diagnosis Date  . Diabetes mellitus without complication   . Arthritis   . Diabetic neuropathy    History reviewed. No pertinent past surgical history. No family history on file. History  Substance Use Topics  . Smoking status: Former Games developer  . Smokeless tobacco: Never Used  . Alcohol Use: No    Review of Systems 10 Systems reviewed and are negative for acute change except as noted in the HPI.    Allergies  Review of patient's allergies indicates no known allergies.  Home Medications   Prior to Admission medications   Medication Sig Start Date End Date Taking? Authorizing Provider  Cholecalciferol (VITAMIN D PO) Take 1 capsule by mouth daily.    Historical Provider, MD  gabapentin (NEURONTIN) 100 MG capsule Take 100-200 mg by mouth 2 (two) times daily. Takes 1 capsule every morning and takes 2 capsules every night at bedtime.    Historical Provider, MD  glimepiride (AMARYL) 4 MG tablet Take 8 mg by mouth daily with breakfast.    Historical Provider, MD  levofloxacin (LEVAQUIN) 500 MG tablet Take 1 tablet (500  mg total) by mouth daily. 11/23/13   Shon Baton, MD  naproxen sodium (ANAPROX) 220 MG tablet Take 440 mg by mouth every morning.    Historical Provider, MD  simvastatin (ZOCOR) 40 MG tablet Take 40 mg by mouth daily.    Historical Provider, MD  sitaGLIPtin (JANUVIA) 100 MG tablet Take 100 mg by mouth at bedtime.    Historical Provider, MD   BP 140/42 mmHg  Pulse 98  Temp(Src) 102.2 F (39 C) (Oral)  Resp 26  Ht 5\' 8"  (1.727 m)  Wt 175 lb (79.379 kg)  BMI 26.61 kg/m2  SpO2 99% Physical Exam  Constitutional: He is oriented to person, place, and time. He appears well-developed and well-nourished. He appears distressed.  HENT:  Head: Normocephalic and atraumatic.  Mouth/Throat: Oropharynx is clear and moist.  Eyes: Conjunctivae and EOM are normal. Pupils are equal, round, and reactive to light. Right eye exhibits no discharge. Left eye exhibits no discharge.  Cardiovascular: Normal rate and regular rhythm.   Pulmonary/Chest:  Pt in moderate respiratory distress with accessory muscle use and tachypnea. Significant decreased breath sounds on left with wheezing.  Abdominal: Soft. Bowel sounds are normal. He exhibits no distension. There is no tenderness.  Neurological: He is alert and oriented to person, place, and time. No cranial nerve deficit. He exhibits normal muscle tone. Coordination normal.  Skin: Skin is warm. He is diaphoretic.  Nursing note and vitals reviewed.   ED Course  Procedures (  including critical care time) Labs Review Labs Reviewed  CBC WITH DIFFERENTIAL/PLATELET - Abnormal; Notable for the following:    Neutrophils Relative % 78 (*)    All other components within normal limits  COMPREHENSIVE METABOLIC PANEL - Abnormal; Notable for the following:    Chloride 100 (*)    Glucose, Bld 221 (*)    GFR calc non Af Amer 52 (*)    GFR calc Af Amer 60 (*)    All other components within normal limits  LIPASE, BLOOD - Abnormal; Notable for the following:    Lipase 17  (*)    All other components within normal limits  URINALYSIS, ROUTINE W REFLEX MICROSCOPIC (NOT AT [facility]) - Abnormal; Notable for the following:    Glucose, UA 100 (*)    Hgb urine dipstick TRACE (*)    Protein, ur 30 (*)    All other components within normal limits  I-STAT CG4 LACTIC ACID, ED - Abnormal; Notable for the following:    Lactic Acid, Venous 3.05 (*)    All other components within normal limits  CULTURE, BLOOD (ROUTINE X 2)  CULTURE, BLOOD (ROUTINE X 2)  URINE CULTURE  PROTIME-INR  URINE MICROSCOPIC-ADD ON  I-STAT TROPOININ, ED  I-STAT CG4 LACTIC ACID, ED  SAMPLE TO BLOOD BANK    Imaging Review Dg Chest Port 1 View  02/25/2015   CLINICAL DATA:  Sepsis, shortness of breath.  EXAM: PORTABLE CHEST - 1 VIEW  COMPARISON:  Frontal and lateral views 12/25/2013  FINDINGS: There are ill-defined left perihilar opacities. Ill-defined right lower lobe opacity, with adjacent eventration of right hemidiaphragm. Cardiomediastinal contours are unchanged allowing for differences in technique. Mild vascular congestion and peribronchial cuffing. No large pleural effusion or pneumothorax.  IMPRESSION: 1. Left perihilar and right lower lobe opacity, concerning for multifocal pneumonia. 2. Peribronchial cuffing, suspect pulmonary edema.   Electronically Signed   By: Rubye Oaks M.D.   On: 02/25/2015 06:17     EKG Interpretation   Date/Time:  Monday February 25 2015 05:54:59 EDT Ventricular Rate:  68 PR Interval:    QRS Duration: 86 QT Interval:  475 QTC Calculation: 505 R Axis:   -22 Text Interpretation:  Sinus rhythm with first degree AV block Abnormal  R-wave progression Borderline ST depression, diffuse leads No significant  change since last tracing Confirmed by Erroll Luna [phone]) on  02/25/2015 6:03:34 AM      MDM   Final diagnoses:  CAP (community acquired pneumonia)  Severe sepsis   Pt presenting with SOB, fever of 102.26F, N/V/D. Report of blood tinged sputum. Pt  initially tachypnic and hypoxic to 85% and placed on non rebreather.  Pt initially diaphoretic with respiratory distress. No abdominal tenderness. I doubt abdominal source. CXR with right lobe opacity and left perihilar opacity concerning for multifocal PNA. Lactic acidosis of 3.05. Pt given IV vancomycin and zosyn. Bolus of fluids initiated.   On reassessment patient resting comfortably with mild respiratory distress. Pt currently O2 of 100% and will attempt to titrate down to 6L [place]. Pt given total of 2 L. Consult to hospitalists. Spoke with Algis Downs PA-C who agrees to evaluate patient with plan for step down.  Discussed all results and patient and family members at bedside verbalizes understanding and agrees with plan.  This is a shared patient. This patient was discussed with the physician who saw and evaluated the patient and agrees with the plan.   Oswaldo Conroy, PA-C 02/25/15 6045  Tilden Fossa, MD 02/25/15  0919 

## 2015-02-25 NOTE — ED Notes (Signed)
Patient presents with c/o diahrrea yesterday and N/V this morning about 4am.  Has been spitting up blood tinged sputum.  +fever

## 2015-02-25 NOTE — ED Notes (Signed)
Pharmacy to send down 1300 medications.

## 2015-02-25 NOTE — ED Notes (Signed)
8B33 RN aware of repeat temp 98.3

## 2015-02-25 NOTE — Progress Notes (Addendum)
ANTIBIOTIC CONSULT NOTE - INITIAL  Pharmacy Consult for Vancomycin and Zosyn Indication: sepsis  No Known Allergies  Patient Measurements:    Vital Signs: Temp: 102.2 F (39 C) (06/06 0559) Temp Source: Oral (06/06 0559) BP: 158/41 mmHg (06/06 0559) Pulse Rate: 56 (06/06 0559) Intake/Output from previous day:   Intake/Output from this shift:    Labs: No results for input(s): WBC, HGB, PLT, LABCREA, CREATININE in the last 72 hours. CrCl cannot be calculated (Unknown ideal weight.). No results for input(s): VANCOTROUGH, VANCOPEAK, VANCORANDOM, GENTTROUGH, GENTPEAK, GENTRANDOM, TOBRATROUGH, TOBRAPEAK, TOBRARND, AMIKACINPEAK, AMIKACINTROU, AMIKACIN in the last 72 hours.   Microbiology: No results found for this or any previous visit (from the past 720 hour(s)).  Medical History: Past Medical History  Diagnosis Date  . Diabetes mellitus without complication   . Arthritis   . Diabetic neuropathy     Medications:  See electronic med rec  Assessment: 81 y.o. male presents with diaphoresis and vomiting with blood-tinged emesis. O2 sats 80s on arrival - given NRB mask in ED. Found to have fever of 102 in ED. To continue zosyn and vancomycin for sepsis. No recent SCr.  Goal of Therapy:  Vancomycin trough level 15-20 mcg/ml  Plan:  Zosyn 3.375gm IV ordered in ED. Vancomycin 1gm IV ordered in ED. Will f/u baseline SCr in order to dose antibiotics  Christoper Fabian, PharmD, BCPS Clinical pharmacist, pager [phone] 02/25/2015,6:04 AM      ============================================   Addendum: - SCr 1.24, CrCL 43 ml/min - LA 3.05   Plan: - Vanc 1500mg  IV Q24H - Zosyn 3.375gm IV Q8H, 4 hr infusion - Monitor renal fxn, clinical progress, vanc trough at Css    Rozell Theiler D. Laney Potash, PharmD, BCPS Pager:  [phone] 02/25/2015, 8:05 AM

## 2015-02-25 NOTE — ED Notes (Signed)
Dr Mora Bellman given a copy of lactic acid results 3.05

## 2015-02-25 NOTE — H&P (Signed)
[facility] History and Physical                                                                                    Jackson [name], is a 81 y.o. male  MRN: [id number]   DOB - [birth year]  Admit Date - 02/25/2015  Outpatient Primary MD for the patient is Kristie Cowman, MD  Referring Physician:  Karmen Stabs, ER PA-C  Chief Complaint:   Chief Complaint  Patient presents with  . Fever  . Nausea  . Emesis     HPI  Jackson [name]  is a 81 y.o. male, with a past medical history of DM and arthritis who presents to the [facility] ER with sepsis including nausea, vomiting, hypoxemia and fever of 102.2. History is gathered from Jackson [name] and his wife.  Day before yesterday he was fine. Yesterday he felt weak and tired. He had nausea GERD symptoms and diarrhea. This morning at approximately 3 AM he woke up "shaking like a leaf" he got up to go to the bathroom and when he returned to the bed he began vomiting. His emesis was brown with streaks of blood. Per his wife he vomited 6 times at home, a couple times in the ambulance, and 2 more times in the ER once he was here. Jackson [name] is normally up and about. He rarely uses a cane. He is independent with ADLs and lives at home with his wife and daughter.  In the emergency department oxygen sats on room air were 85%. Once he was placed on 6 L nasal cannula he began satting at 97-100%. White count is normal, temperature is 102.2. Respirations are 35. Lactic acid is 3.05. Chest x-ray shows bilateral pneumonia with peribronchial cuffing and possible pulmonary edema.  Review of Systems   In addition to the HPI above,  No Headache, No changes with Vision or hearing, No problems swallowing food or Liquids, No Chest pain No Blood in stool or Urine, No dysuria, No new skin rashes or bruises, No new joints pains-aches,  No new weakness, tingling, numbness in any extremity, No recent weight gain or loss, A full 10 point Review of Systems was done,  except as stated above, all other Review of Systems were negative.  Past Medical History  Past Medical History  Diagnosis Date  . Diabetes mellitus without complication   . Arthritis   . Diabetic neuropathy     History reviewed. No pertinent past surgical history.    Social History History  Substance Use Topics  . Smoking status: Former Games developer  . Smokeless tobacco: Never Used  . Alcohol Use: No  Smoked for 10 years.  Quit at age 73.  Family History Mother had diabetes.  Father was and alcoholic.  No known Lung cancer in the family. His brother died at 53 years old with pneumonia in 81  Prior to Admission medications   Medication Sig Start Date End Date Taking? Authorizing Provider  albuterol (PROVENTIL HFA;VENTOLIN HFA) 108 (90 BASE) MCG/ACT inhaler Inhale 2 puffs into the lungs every 4 (four) hours as needed for wheezing or shortness of breath.   Yes Historical Provider, MD  aspirin EC  81 MG tablet Take 81 mg by mouth daily.   Yes Historical Provider, MD  Cholecalciferol (VITAMIN D PO) Take 1 capsule by mouth daily.   Yes Historical Provider, MD  diclofenac (CATAFLAM) 50 MG tablet Take 50 mg by mouth 2 (two) times daily. 02/04/15  Yes Historical Provider, MD  gabapentin (NEURONTIN) 100 MG capsule Take 100 mg by mouth 3 (three) times daily.    Yes Historical Provider, MD  glimepiride (AMARYL) 4 MG tablet Take 8 mg by mouth daily with breakfast.   Yes Historical Provider, MD  Multiple Vitamins-Minerals (MULTIVITAMIN PO) Take 1 tablet by mouth daily.   Yes Historical Provider, MD  simvastatin (ZOCOR) 40 MG tablet Take 40 mg by mouth daily.   Yes Historical Provider, MD  sitaGLIPtin (JANUVIA) 100 MG tablet Take 100 mg by mouth at bedtime.   Yes Historical Provider, MD  tetrahydrozoline-zinc (VISINE-AC) 0.05-0.25 % ophthalmic solution Place 2 drops into both eyes 3 (three) times daily as needed (dry eyes).   Yes Historical Provider, MD    Allergies  Allergen Reactions  . Actos  [Pioglitazone] Swelling    Physical Exam  Vitals  Blood pressure 110/50, pulse 88, temperature 97.7 F (36.5 C), temperature source Oral, resp. rate 16, height  (1.727 m), weight 79.379 kg (175 lb), SpO2 97 %.   General:  Elderly, pleasant, HOH male, lying in bed in NAD, wife and son at bedside.  Psych:  Normal affect and insight, Not Suicidal or Homicidal, Awake Alert, Oriented X 3.  Neuro:   No F.N deficits, ALL C.Nerves Intact, Strength 5/5 all 4 extremities, Sensation intact all 4 extremities.  ENT:  Ears and Eyes appear Normal, Conjunctivae clear, PER. Moist oral mucosa without erythema or exudates.  Neck:  Supple, No lymphadenopathy appreciated  Respiratory:  No increased work of breathing, crackles noted bilaterally at the bases.  On further exam, my attending MD found bilateral rhonchi.  Cardiac:  RRR, No Murmurs, no LE edema noted, no JVD.    Abdomen:  Positive bowel sounds, Soft, Non tender, Non distended,  No masses appreciated  Skin:  No Cyanosis, Normal Skin Turgor, No Skin Rash or Bruise.  Extremities:  Able to move all 4. 5/5 strength in each,  no effusions.  Data Review  CBC  Recent Labs Lab 02/25/15 0608  WBC 6.5  HGB 14.9  HCT 42.0  PLT 180  MCV 91.5  MCH 32.5  MCHC 35.5  RDW 12.7  LYMPHSABS 0.8  MONOABS 0.4  EOSABS 0.1  BASOSABS 0.0    Chemistries   Recent Labs Lab 02/25/15 0608  NA 137  K 4.2  CL 100*  CO2 24  GLUCOSE 221*  BUN 19  CREATININE 1.24  CALCIUM 9.2  AST 22  ALT 22  ALKPHOS 61  BILITOT 0.9   Coagulation profile  Recent Labs Lab 02/25/15 0608  INR 1.01    Urinalysis    Component Value Date/Time   COLORURINE YELLOW 02/25/2015 0628   APPEARANCEUR CLEAR 02/25/2015 0628   LABSPEC 1.012 02/25/2015 0628   PHURINE 5.0 02/25/2015 0628   GLUCOSEU 100* 02/25/2015 0628   HGBUR TRACE* 02/25/2015 0628   BILIRUBINUR NEGATIVE 02/25/2015 0628   KETONESUR NEGATIVE 02/25/2015 0628   PROTEINUR 30* 02/25/2015 0628    UROBILINOGEN 0.2 02/25/2015 0628   NITRITE NEGATIVE 02/25/2015 0628   LEUKOCYTESUR NEGATIVE 02/25/2015 0628    Imaging results:   Dg Chest Port 1 View  02/25/2015   CLINICAL DATA:  Sepsis, shortness of breath.  EXAM: PORTABLE CHEST - 1 VIEW  COMPARISON:  Frontal and lateral views 12/25/2013  FINDINGS: There are ill-defined left perihilar opacities. Ill-defined right lower lobe opacity, with adjacent eventration of right hemidiaphragm. Cardiomediastinal contours are unchanged allowing for differences in technique. Mild vascular congestion and peribronchial cuffing. No large pleural effusion or pneumothorax.  IMPRESSION: 1. Left perihilar and right lower lobe opacity, concerning for multifocal pneumonia. 2. Peribronchial cuffing, suspect pulmonary edema.   Electronically Signed   By: Rubye Oaks M.D.   On: 02/25/2015 06:17    My personal review of EKG: 1st degree block, question ST depression in V4/V5   Assessment & Plan  Principal Problem:   Severe sepsis Active Problems:   Bilateral pneumonia   Diabetes mellitus   Lactic acidosis   Hypoxia   Sepsis with lactic acidosis Lactic acidosis, hypoxemia (85% on room air), Fever 102, tachypnea Patient received IV Fluid bolus in the ER.  Blood cultures are pending and he was started on vanc/zosyn Lactic acid has normalized in the ER with IVF.  Bilateral pna HIV, urine for legionella and strep pneumo pending as well as influenza PCR. Will continue vanc / zosyn for now.  He needs pseudomonal coverage as a diabetic.  Would narrow antibiotics asap. Nebs PRN, Mucinex, Flutter valve.   Hypoxemia Initially required a non rebreather, then was placed on 6L in the ER. Secondary to PNA and CXR implies possible pulmonary edema.  Given age will check 2D echo. Strict Is & Os, Low salt/carb mod diet, daily weights.  Will saline lock fluids now as he is eating/drinking well and appears much better in the ER.  Check ambulating oxygen sats.     Diabetes Mellitus with neuropathy Check Hgb A1C, Carb Modified diet, SSI - Moderate.  Will add low dose lantus.   Consultants Called:  none  Family Communication:   Wife and son at bedside  Code Status:  DNR  Condition:  Guarded.  Potential Disposition: To home in 72 hours.  Time spent in minutes : [address]  PA-C on 02/25/2015 at 9:53 AM Between 7am to 7pm - Pager - [phone] After 7pm go to [email or web address] - password [email or web address] And look for the night coverage person covering me after hours  [facility]

## 2015-02-26 ENCOUNTER — Inpatient Hospital Stay (HOSPITAL_COMMUNITY): Payer: Medicare Other

## 2015-02-26 DIAGNOSIS — R0600 Dyspnea, unspecified: Secondary | ICD-10-CM

## 2015-02-26 DIAGNOSIS — E118 Type 2 diabetes mellitus with unspecified complications: Secondary | ICD-10-CM

## 2015-02-26 LAB — BASIC METABOLIC PANEL
Anion gap: 8 (ref 5–15)
BUN: 17 mg/dL (ref 6–20)
CALCIUM: 8.9 mg/dL (ref 8.9–10.3)
CO2: 29 mmol/L (ref 22–32)
Chloride: 103 mmol/L (ref 101–111)
Creatinine, Ser: 1.36 mg/dL — ABNORMAL HIGH (ref 0.61–1.24)
GFR calc Af Amer: 53 mL/min — ABNORMAL LOW (ref >60)
GFR calc non Af Amer: 46 mL/min — ABNORMAL LOW (ref >60)
Glucose, Bld: 119 mg/dL — ABNORMAL HIGH (ref 65–99)
Potassium: 4.1 mmol/L (ref 3.5–5.1)
Sodium: 140 mmol/L (ref 135–145)

## 2015-02-26 LAB — URINE CULTURE
Colony Count: NO GROWTH
Culture: NO GROWTH

## 2015-02-26 LAB — HEMOGLOBIN A1C
Hgb A1c MFr Bld: 9.4 % — ABNORMAL HIGH (ref 4.8–5.6)
Mean Plasma Glucose: 223 mg/dL

## 2015-02-26 LAB — LEGIONELLA ANTIGEN, URINE

## 2015-02-26 LAB — GLUCOSE, CAPILLARY
GLUCOSE-CAPILLARY: 170 mg/dL — AB (ref 65–99)
Glucose-Capillary: 179 mg/dL — ABNORMAL HIGH (ref 65–99)
Glucose-Capillary: 101 mg/dL — ABNORMAL HIGH (ref 65–99)
Glucose-Capillary: 189 mg/dL — ABNORMAL HIGH (ref 65–99)
Glucose-Capillary: 145 mg/dL — ABNORMAL HIGH (ref 65–99)

## 2015-02-26 LAB — SAMPLE TO BLOOD BANK

## 2015-02-26 LAB — CBC
HCT: 35 % — ABNORMAL LOW (ref 39.0–52.0)
Hemoglobin: 12 g/dL — ABNORMAL LOW (ref 13.0–17.0)
MCH: 31.9 pg (ref 26.0–34.0)
MCHC: 34.3 g/dL (ref 30.0–36.0)
MCV: 93.1 fL (ref 78.0–100.0)
Platelets: 168 10*3/uL (ref 150–400)
RBC: 3.76 MIL/uL — AB (ref 4.22–5.81)
RDW: 12.7 % (ref 11.5–15.5)
WBC: 9.6 10*3/uL (ref 4.0–10.5)

## 2015-02-26 MED ORDER — DICLOFENAC SODIUM 50 MG PO TBEC
50.0000 mg | DELAYED_RELEASE_TABLET | Freq: Two times a day (BID) | ORAL | Status: DC
  Administered 2015-02-26 – 2015-02-28 (×5): 50 mg via ORAL
  Filled 2015-02-26 (×6): qty 1

## 2015-02-26 MED ORDER — FLUTICASONE PROPIONATE 50 MCG/ACT NA SUSP
1.0000 | Freq: Every day | NASAL | Status: DC
  Administered 2015-02-26 – 2015-02-28 (×3): 1 via NASAL
  Filled 2015-02-26: qty 16

## 2015-02-26 NOTE — Evaluation (Signed)
Physical Therapy Evaluation Patient Details Name: Jackson [name] MRN: [id number] DOB: March 22, 1929 Today's Date: 02/26/2015   History of Present Illness  81 y.o. male, with a past medical history of DM, GERD and arthritis admitted with fever/nausea, emesis. Chest x-ray shows bilateral pneumonia with peribronchial cuffing and possible pulmonary edema. Repeat CXR resolved vascular congestion and decreased bilateral lung opacities, suggestive of improved edema. Patchy left perihilar and bibasilar opacities remain, and superimposed pneumonia is possible.   Clinical Impression  Pt admitted with above diagnosis. Pt currently with functional limitations due to the deficits listed below (see PT Problem List). Pt will need Outpt PT for balance program.  Pt instructed to use RW at all times on d/c to decr falls.  Pt and wife agree.  Will follow acutely.   Pt will benefit from skilled PT to increase their independence and safety with mobility to allow discharge to the venue listed below.     Follow Up Recommendations Outpatient PT;Supervision/Assistance - 24 hour (balance program)    Equipment Recommendations  None recommended by PT    Recommendations for Other Services       Precautions / Restrictions Precautions Precautions: Fall Restrictions Weight Bearing Restrictions: No      Mobility  Bed Mobility Overal bed mobility: Independent             General bed mobility comments: took incr time but got to EOB on his own.    Transfers Overall transfer level: Needs assistance Equipment used: None Transfers: Sit to/from Stand Sit to Stand: Min guard;+2 safety/equipment         General transfer comment: steadying assist needed  Ambulation/Gait Ambulation/Gait assistance: Min assist;+2 safety/equipment Ambulation Distance (Feet): 250 Feet Assistive device: None Gait Pattern/deviations: Step-through pattern;Decreased stride length;Decreased step length - right;Decreased step length -  left;Shuffle;Staggering left;Staggering right;Narrow base of support;Trunk flexed   Gait velocity interpretation: Below normal speed for age/gender General Gait Details: Pt ambulated without device with fair stability staggering at times.  Does not stand fully upirght with hips and knees slifghtly bent.  Instructed pt to use RW at home and also recommend  a balance Outpt program.    Stairs            Wheelchair Mobility    Modified Rankin (Stroke Patients Only)       Balance Overall balance assessment: Needs assistance         Standing balance support: No upper extremity supported;During functional activity Standing balance-Leahy Scale: Fair Standing balance comment: can stand statically without support for up to a minute. Cannot tolerate challenges to balance without UE support.                              Pertinent Vitals/Pain Pain Assessment: No/denies pain  VSS    Home Living Family/patient expects to be discharged to:: Private residence Living Arrangements: Spouse/significant other Available Help at Discharge: Family;Available 24 hours/day Type of Home: House Home Access: Stairs to enter Entrance Stairs-Rails: Right;Left;Can reach both Entrance Stairs-Number of Steps: 3 Home Layout: Two level;Able to live on main level with bedroom/bathroom;Laundry or work area in [employer] Equipment: Gilmer Mor - single point;Walker - 2 wheels;Bedside commode;Shower seat Additional Comments: Pt reports having some falls.  Wasn't using walker with falls.      Prior Function Level of Independence: Independent with assistive device(s)         Comments: used cane vs. RW vs. no device  Hand Dominance        Extremity/Trunk Assessment   Upper Extremity Assessment: Defer to OT evaluation           Lower Extremity Assessment: Generalized weakness      Cervical / Trunk Assessment: Kyphotic  Communication   Communication: No difficulties  Cognition  Arousal/Alertness: Awake/alert Behavior During Therapy: WFL for tasks assessed/performed Overall Cognitive Status: Within Functional Limits for tasks assessed                      General Comments      Exercises General Exercises - Lower Extremity Ankle Circles/Pumps: AROM;5 reps;Both;Seated Long Arc Quad: AROM;Both;10 reps;Seated      Assessment/Plan    PT Assessment Patient needs continued PT services  PT Diagnosis Generalized weakness   PT Problem List Decreased activity tolerance;Decreased balance;Decreased mobility;Decreased knowledge of use of DME;Decreased safety awareness;Decreased knowledge of precautions  PT Treatment Interventions DME instruction;Gait training;Functional mobility training;Therapeutic exercise;Stair training;Therapeutic activities;Balance training;Patient/family education   PT Goals (Current goals can be found in the Care Plan section) Acute Rehab PT Goals Patient Stated Goal: to go home PT Goal Formulation: With patient Time For Goal Achievement: 03/12/15 Potential to Achieve Goals: Good    Frequency Min 3X/week   Barriers to discharge        Co-evaluation               End of Session Equipment Utilized During Treatment: Gait belt Activity Tolerance: Patient limited by fatigue Patient left: in chair;with call bell/phone within reach;with family/visitor present Nurse Communication: Mobility status         Time: 0454-0981 PT Time Calculation (min) (ACUTE ONLY): 23 min   Charges:   PT Evaluation $Initial PT Evaluation Tier I: 1 Procedure PT Treatments $Gait Training: 8-22 mins   PT G CodesBerline [name] March 05, 2015, 11:48 AM Jackson [name] Acute Rehabilitation [phone] (pager)

## 2015-02-26 NOTE — H&P (Signed)
PROGRESS NOTE  Jackson [name]:[id number] DOB: Apr 01, 1929 DOA: 02/25/2015 PCP: Kristie Cowman, MD  Subjective / 24 H Interval events Feeling better this morning, breathing better Denies chest pain  HPI: 81 y.o. male, with a past medical history of DM and arthritis who presents to the [facility] ER with sepsis including nausea, vomiting, hypoxemia and fever of 102.2, found to have bilateral pneumonia on CXR and admitted to SDU due to being on a NRB on arrival.  Assessment/Plan: Principal Problem:   Severe sepsis Active Problems:   Bilateral pneumonia   Diabetes mellitus   Lactic acidosis   Hypoxia   Sepsis   Acute respiratory failure with hypoxia   CAP (community acquired pneumonia)   SOB (shortness of breath)   Type 2 diabetes mellitus with hyperglycemia  Sepsis with lactic acidosis - he had lactic acidosis, hypoxemia (85% on room air), Fever 102, tachypnea on admission - sepsis physiology improved this morning, he is stable on nasal cannula  Bilateral pneumonia - started on vancomycin and zosyn on admission, continue today, as he is improving and if cultures remain negative for 48 hours may be able to narrow to po tomorrow - strep penumo negative, legionella negative  Acute respiratory failure with hypoxemia - wean off oxygen as tolerated, still requiring oxygen this morning - has a component on pulmonary edema on CXR, 2D echo pending  Diabetes Mellitus with neuropathy - continue current regimen - A1C pending   Diet: Diet heart healthy/carb modified Room service appropriate?: Yes; Fluid consistency:: Thin Fluids: none  DVT Prophylaxis: Lovenox  Code Status: DNR Family Communication: d/w family bedside  Disposition Plan: transfer to floor today, possible d/c Wed  Consultants:  None   Procedures:  None    Antibiotics Vancomycin 6/6 >> Zosyn 6/6 >>   Studies  X-ray Chest Pa And Lateral 02/26/2015 Resolved vascular congestion and decreased bilateral  lung opacities, suggestive of improved edema. Patchy left perihilar and bibasilar opacities remain, and superimposed pneumonia is possible.  Dg Chest Port 1 View 02/25/2015  1. Left perihilar and right lower lobe opacity, concerning for multifocal pneumonia. 2. Peribronchial cuffing, suspect pulmonary edema.   Objective  Filed Vitals:   02/26/15 0500 02/26/15 0810 02/26/15 0825 02/26/15 0900  BP:  128/57  126/52  Pulse:  57 35 68  Temp:  97.8 F (36.6 C)    TempSrc:  Oral    Resp:  Height:      Weight: 81 kg (178 lb 9.2 oz)     SpO2:  100% 91% 90%    Intake/Output Summary (Last 24 hours) at 02/26/15 1255 Last data filed at 02/26/15 0941  Gross per 24 hour  Intake   1550 ml  Output    600 ml  Net    950 ml   Filed Weights   02/25/15 0622 02/25/15 1950 02/26/15 0500  Weight: 79.379 kg (175 lb) 81.6 kg (179 lb 14.3 oz) 81 kg (178 lb 9.2 oz)   Exam:  General:  NAD   HEENT: no scleral icterus, PERRL  Cardiovascular: RRR without MRG, 2+ peripheral pulses, no edema  Respiratory: CTA biL, good air movement, no wheezing, no crackles, no rales  Abdomen: soft, non tender, BS +, no guarding  MSK/Extremities: no clubbing/cyanosis, no joint swelling  Skin: no rashes  Neuro: non focal, strength 5/5 in all 4   Data Reviewed: Basic Metabolic Panel:  Recent Labs Lab 02/25/15 0608 02/26/15 0322  NA 137 140  K 4.2  4.1  CL 100* 103  CO2 24 29  GLUCOSE 221* 119*  BUN 19 17  CREATININE 1.24 1.36*  CALCIUM 9.2 8.9   Liver Function Tests:  Recent Labs Lab 02/25/15 0608  AST 22  ALT 22  ALKPHOS 61  BILITOT 0.9  PROT 6.7  ALBUMIN 3.7    Recent Labs Lab 02/25/15 0608  LIPASE 17*   CBC:  Recent Labs Lab 02/25/15 0608 02/26/15 0322  WBC 6.5 9.6  NEUTROABS 5.1  --   HGB 14.9 12.0*  HCT 42.0 35.0*  MCV 91.5 93.1  PLT 180 168   CBG:  Recent Labs Lab 02/25/15 1250 02/25/15 1716 02/25/15 2218 02/26/15 0838 02/26/15 1236  GLUCAP 224* 179*  180* 101* 189*    Recent Results (from the past 240 hour(s))  Blood Culture (routine x 2)     Status: None (Preliminary result)   Collection Time: 02/25/15  6:08 AM  Result Value Ref Range Status   Specimen Description BLOOD BLOOD LEFT FOREARM  Final   Special Requests BAA  Final   Culture   Final           BLOOD CULTURE RECEIVED NO GROWTH TO DATE CULTURE WILL BE HELD FOR 5 DAYS BEFORE ISSUING A FINAL NEGATIVE REPORT Performed at [facility]    Report Status PENDING  Incomplete  Urine culture     Status: None   Collection Time: 02/25/15  6:28 AM  Result Value Ref Range Status   Specimen Description URINE, CLEAN CATCH  Final   Special Requests NONE  Final   Colony Count NO GROWTH Performed at [facility]   Final   Culture NO GROWTH Performed at [facility]   Final   Report Status 02/26/2015 FINAL  Final  Blood Culture (routine x 2)     Status: None (Preliminary result)   Collection Time: 02/25/15  6:38 AM  Result Value Ref Range Status   Specimen Description BLOOD ARM RIGHT  Final   Special Requests BAA 5CC  Final   Culture   Final           BLOOD CULTURE RECEIVED NO GROWTH TO DATE CULTURE WILL BE HELD FOR 5 DAYS BEFORE ISSUING A FINAL NEGATIVE REPORT Performed at [facility]    Report Status PENDING  Incomplete  MRSA PCR Screening     Status: None   Collection Time: 02/25/15  8:13 PM  Result Value Ref Range Status   MRSA by PCR NEGATIVE NEGATIVE Final    Comment:        The GeneXpert MRSA Assay (FDA approved for NASAL specimens only), is one component of a comprehensive MRSA colonization surveillance program. It is not intended to diagnose MRSA infection nor to guide or monitor treatment for MRSA infections.      Scheduled Meds: . aspirin EC  81 mg Oral Daily  . diclofenac  50 mg Oral BID  . enoxaparin (LOVENOX) injection  40 mg Subcutaneous Q24H  . fluticasone  1 spray Each Nare Daily  . gabapentin  100 mg Oral TID  .  guaiFENesin  1,200 mg Oral BID  . insulin aspart  0-15 Units Subcutaneous TID WC  . insulin glargine  10 Units Subcutaneous QHS  . piperacillin-tazobactam (ZOSYN)  IV  3.375 g Intravenous Q8H  . simvastatin  40 mg Oral Daily  . sodium chloride  3 mL Intravenous Q12H  . sodium chloride  3 mL Intravenous Q12H  . vancomycin  1,500  mg Intravenous Q24H   Continuous Infusions:   Pamella Pert, MD [facility] Pager [phone]. If 7 PM - 7 AM, please contact night-coverage at [email or web address], password [facility] 02/26/2015, 12:55 PM  LOS: 1 day

## 2015-02-26 NOTE — Evaluation (Signed)
Clinical/Bedside Swallow Evaluation Patient Details  Name: Jackson [name] MRN: [id number] Date of Birth: 1929-07-19  Today's Date: 02/26/2015 Time: SLP Start Time (ACUTE ONLY): 9024 SLP Stop Time (ACUTE ONLY): 0941 SLP Time Calculation (min) (ACUTE ONLY): 13 min  Past Medical History:  Past Medical History  Diagnosis Date  . Diabetes mellitus without complication   . Arthritis   . Diabetic neuropathy    Past Surgical History: History reviewed. No pertinent past surgical history. HPI:  81 y.o. male, with a past medical history of DM, GERD and arthritis admitted with fever/nausea, emesis. Chest x-ray shows bilateral pneumonia with peribronchial cuffing and possible pulmonary edema. Repeat CXR resolved vascular congestion and decreased bilateral lung opacities, suggestive of improved edema. Patchy left perihilar and bibasilar opacities remain, and superimposed pneumonia is possible.   Assessment / Plan / Recommendation Clinical Impression  Pt and wife complain of intermittent globus sensation (chest/esophagus) with food, particularly meat and bread. Oral and pharyngeal phases of swallow are within functional limits. Suspect esophageal involvement at baseline and he may have aspirated emesis during vomiting episode. Pt educated on esophageal precautions that may mitigate intermittent symptoms. Continue regular texture and thin liquids. No f/u needed.      Aspiration Risk  Mild    Diet Recommendation Thin (regular)   Medication Administration: Whole meds with liquid Compensations: Follow solids with liquid    Other  Recommendations Oral Care Recommendations: Oral care BID   Follow Up Recommendations       Frequency and Duration        Pertinent Vitals/Pain none         Swallow Study           Oral/Motor/Sensory Function Overall Oral Motor/Sensory Function: Appears within functional limits for tasks assessed   Ice Chips Ice chips: Not tested   Thin Liquid Thin Liquid:  Within functional limits Presentation: Cup;Straw    Nectar Thick Nectar Thick Liquid: Not tested   Honey Thick Honey Thick Liquid: Not tested   Puree Puree: Not tested   Solid   GO    Solid: Within functional limits       Royce Macadamia 02/26/2015,9:59 AM   Breck Coons Lonell Face.Ed [employer] [phone]

## 2015-02-26 NOTE — Progress Notes (Signed)
  Echocardiogram 2D Echocardiogram has been performed.  Spencer Peterkin FRANCES 02/26/2015, 3:19 PM

## 2015-02-26 NOTE — Progress Notes (Signed)
Admission note:  Arrival Method: Patient arrived to unit on bed from [facility] with staff accompanying. Mental Orientation:  Alert and oriented x 4. Telemetry: 6E-12, NSB.  CCMD notified. Assessment: See doc flow sheets. Skin: Warm, dry and intact, no open areas or any rashes noted.  Bruising noted on left arm.  Assessed by two nurses (Jasmine A). IV: Left AC Saline locked, L FA  Infusing. Pain: Denies any pain currently. Tubes: N/A Safety Measures: Bed in low position, call light and phone at reach. Fall Prevention Safety Plan: Reviewed the plan, understood and acknowledged. Admission Screening: Complete. 6700 Orientation: Patient has been oriented to the unit, staff and to the room.

## 2015-02-26 NOTE — Progress Notes (Signed)
Spoke with patient and his wife about diabetes and home regimen for diabetes control. Patient reports that he is followed by his PCP for diabetes management and currently he takes Amaryl 8 mg QAM and Januvia 100 mg QHS as an outpatient for diabetes control.  Patient states that he was started on Invokana about 6 months ago and took it for about 3 months and then his doctor took him off the Invokana due to side effects (abdominal pain). Patient states that his doctor did not add any other medications or change any of the dosages for Amaryl or Januvia when the Invokana was stopped.  Inquired about knowledge about A1C and patient reports that his last A1C was in the 9% range.  Patient reports that his glucose is usually in the 200's mg/dl for the most part when he checks it at home. Patient states that despite making dietary changes and following a diabetic diet his glucose has continued to be in the 200's mg/dl.  Discussed A1C results (9.4% on 02/25/15) and explained that current A1C indicates a glucose average of 222 mg/dl. Discussed importance of checking CBGs and maintaining good CBG control to prevent long-term and short-term complications. Discussed impact of nutrition, exercise, stress, sickness, and medications on diabetes control.  Patient reports that him and his wife have been talking about using insulin and was planning to talk to his doctor about starting him on insulin and stopping the oral diabetes medications. Patient states that he would like to start on insulin and feels that he needs it to get his diabetes under control. Discussed Lantus and Novolog insulin as used here in the hospital. Patient has an interest in starting on Lantus as an outpatient and would like to stop taking oral DM medications. Informed patient I would make notation of his interest in using insulin and explained it would be up to the doctor whether he would be started on insulin at discharge. Patient verbalized understanding of  information discussed and he states that he has no further questions at this time related to diabetes.   MD- Please indicate in note if patient will be discharged on insulin to ensure that patient is properly educated on insulin prior to discharge.  Thanks, Orlando Penner, RN, MSN, CCRN, CDE Diabetes Coordinator Inpatient Diabetes [facility] [phone] (Team Pager) [phone] ([facility] office) [phone] [facility] office) [phone] [facility] office)

## 2015-02-26 NOTE — Progress Notes (Signed)
Utilization Review Completed.  

## 2015-02-27 ENCOUNTER — Ambulatory Visit: Payer: Self-pay | Admitting: Endocrinology

## 2015-02-27 DIAGNOSIS — J189 Pneumonia, unspecified organism: Secondary | ICD-10-CM

## 2015-02-27 DIAGNOSIS — Z794 Long term (current) use of insulin: Secondary | ICD-10-CM

## 2015-02-27 DIAGNOSIS — A419 Sepsis, unspecified organism: Principal | ICD-10-CM

## 2015-02-27 DIAGNOSIS — E119 Type 2 diabetes mellitus without complications: Secondary | ICD-10-CM

## 2015-02-27 DIAGNOSIS — R6520 Severe sepsis without septic shock: Secondary | ICD-10-CM

## 2015-02-27 DIAGNOSIS — J9601 Acute respiratory failure with hypoxia: Secondary | ICD-10-CM

## 2015-02-27 LAB — BASIC METABOLIC PANEL
ANION GAP: 9 (ref 5–15)
BUN: 18 mg/dL (ref 6–20)
CALCIUM: 9 mg/dL (ref 8.9–10.3)
CHLORIDE: 103 mmol/L (ref 101–111)
CO2: 26 mmol/L (ref 22–32)
Creatinine, Ser: 1.36 mg/dL — ABNORMAL HIGH (ref 0.61–1.24)
GFR calc Af Amer: 53 mL/min — ABNORMAL LOW (ref >60)
GFR calc non Af Amer: 46 mL/min — ABNORMAL LOW (ref >60)
GLUCOSE: 157 mg/dL — AB (ref 65–99)
POTASSIUM: 4.1 mmol/L (ref 3.5–5.1)
SODIUM: 138 mmol/L (ref 135–145)

## 2015-02-27 LAB — GLUCOSE, CAPILLARY
GLUCOSE-CAPILLARY: 209 mg/dL — AB (ref 65–99)
Glucose-Capillary: 140 mg/dL — ABNORMAL HIGH (ref 65–99)
Glucose-Capillary: 206 mg/dL — ABNORMAL HIGH (ref 65–99)
Glucose-Capillary: 165 mg/dL — ABNORMAL HIGH (ref 65–99)

## 2015-02-27 LAB — URINALYSIS, ROUTINE W REFLEX MICROSCOPIC
Bilirubin Urine: NEGATIVE
Glucose, UA: 250 mg/dL — AB
HGB URINE DIPSTICK: NEGATIVE
Ketones, ur: NEGATIVE mg/dL
Leukocytes, UA: NEGATIVE
Nitrite: NEGATIVE
PH: 7 (ref 5.0–8.0)
PROTEIN: NEGATIVE mg/dL
Specific Gravity, Urine: 1.008 (ref 1.005–1.030)
Urobilinogen, UA: 0.2 mg/dL (ref 0.0–1.0)

## 2015-02-27 MED ORDER — DOXYCYCLINE HYCLATE 100 MG PO TABS
100.0000 mg | ORAL_TABLET | Freq: Two times a day (BID) | ORAL | Status: DC
  Administered 2015-02-27 – 2015-02-28 (×3): 100 mg via ORAL
  Filled 2015-02-27 (×5): qty 1

## 2015-02-27 NOTE — Progress Notes (Addendum)
PROGRESS NOTE  Jackson [name]:[id number] DOB: 1929/05/13 DOA: 02/25/2015 PCP: Jackson Cowman, MD  HPI/Recap of past 24 hours: Sitting in chair, reported feeling better, c/o nasal congestion. Wife at bedside. RN reported patient was observed desats in to the 80's on 2liter while sleeping.  Assessment/Plan: Principal Problem:   Severe sepsis Active Problems:   Bilateral pneumonia   Diabetes mellitus   Lactic acidosis   Hypoxia   Sepsis   Acute respiratory failure with hypoxia   CAP (community acquired pneumonia)   SOB (shortness of breath)   Type 2 diabetes mellitus with hyperglycemia  Sepsis with lactic acidosis - he had lactic acidosis, hypoxemia (85% on room air), Fever 102, tachypnea on admission - sepsis physiology improved this morning, he is stable on nasal cannula -wean oxygen, narrow abx  Bilateral pneumonia - started on vancomycin and zosyn on admission, continue today, as he is improving, narrow abx to oral doxycycline from 6/8, consider repeat cxr tomorrow if persistent basilar crackles, flutter valve, ambulate check o2 sats - strep penumo negative, legionella negative  Acute respiratory failure with hypoxemia - wean off oxygen as tolerated, still requiring oxygen this morning - has a component on pulmonary edema on CXR, 2D echo adequate LVEF, technically difficult to access diastolic dysfunction. -continue to wean oxygen, need outpatient sleep study due to observed desaturation during sleep  Diabetes Mellitus with neuropathy - continue current regimen - A1C 9.4  Worsening cr: from abx ? Check ua, d/c vanc/zosyn, continue monitor cr.  Diet: Diet heart healthy/carb modified Room service appropriate?: Yes; Fluid consistency:: Thin Fluids: none  DVT Prophylaxis: Lovenox  Code Status: DNR Family Communication: d/w patient and wife at bedside   Disposition Plan: likely d/c in 1-2  days   Consultants:  none  Procedures:  none  Antibiotics: Vanc/zosyn from admission   Objective: BP 146/46 mmHg  Pulse 56  Temp(Src) 98.5 F (36.9 C) (Oral)  Resp 17  Ht 5\' 8"  (1.727 m)  Wt 79.606 kg (175 lb 8 oz)  BMI 26.69 kg/m2  SpO2 93%  Intake/Output Summary (Last 24 hours) at 02/27/15 1137 Last data filed at 02/27/15 1054  Gross per 24 hour  Intake   1320 ml  Output    650 ml  Net    670 ml   Filed Weights   02/25/15 1950 02/26/15 0500 02/26/15 2100  Weight: 81.6 kg (179 lb 14.3 oz) 81 kg (178 lb 9.2 oz) 79.606 kg (175 lb 8 oz)    Exam:   General:  NAD  Cardiovascular: RRR  Respiratory: + bibasilar crackles , no wheezing, no rhonchi  Abdomen: Soft/ND/NT, positive BS  Musculoskeletal: No Edema  Neuro: aaox3  Data Reviewed: Basic Metabolic Panel:  Recent Labs Lab 02/25/15 0608 02/26/15 0322 02/27/15 0528  NA 137 140 138  K 4.2 4.1 4.1  CL 100* 103 103  CO2 24 29 26   GLUCOSE 221* 119* 157*  BUN 19 17 18   CREATININE 1.24 1.36* 1.36*  CALCIUM 9.2 8.9 9.0   Liver Function Tests:  Recent Labs Lab 02/25/15 0608  AST 22  ALT 22  ALKPHOS 61  BILITOT 0.9  PROT 6.7  ALBUMIN 3.7    Recent Labs Lab 02/25/15 0608  LIPASE 17*   No results for input(s): AMMONIA in the last 168 hours. CBC:  Recent Labs Lab 02/25/15 0608 02/26/15 0322  WBC 6.5 9.6  NEUTROABS 5.1  --   HGB 14.9 12.0*  HCT 42.0 35.0*  MCV 91.5 93.1  PLT  180 168   Cardiac Enzymes:   No results for input(s): CKTOTAL, CKMB, CKMBINDEX, TROPONINI in the last 168 hours. BNP (last 3 results) No results for input(s): BNP in the last 8760 hours.  ProBNP (last 3 results) No results for input(s): PROBNP in the last 8760 hours.  CBG:  Recent Labs Lab 02/26/15 0838 02/26/15 1236 02/26/15 1705 02/26/15 2135 02/27/15 0749  GLUCAP 101* 189* 170* 145* 140*    Recent Results (from the past 240 hour(s))  Blood Culture (routine x 2)     Status: None (Preliminary  result)   Collection Time: 02/25/15  6:08 AM  Result Value Ref Range Status   Specimen Description BLOOD BLOOD LEFT FOREARM  Final   Special Requests BAA  Final   Culture   Final           BLOOD CULTURE RECEIVED NO GROWTH TO DATE CULTURE WILL BE HELD FOR 5 DAYS BEFORE ISSUING A FINAL NEGATIVE REPORT Performed at [facility]    Report Status PENDING  Incomplete  Urine culture     Status: None   Collection Time: 02/25/15  6:28 AM  Result Value Ref Range Status   Specimen Description URINE, CLEAN CATCH  Final   Special Requests NONE  Final   Colony Count NO GROWTH Performed at [facility]   Final   Culture NO GROWTH Performed at [facility]   Final   Report Status 02/26/2015 FINAL  Final  Blood Culture (routine x 2)     Status: None (Preliminary result)   Collection Time: 02/25/15  6:38 AM  Result Value Ref Range Status   Specimen Description BLOOD ARM RIGHT  Final   Special Requests BAA 5CC  Final   Culture   Final           BLOOD CULTURE RECEIVED NO GROWTH TO DATE CULTURE WILL BE HELD FOR 5 DAYS BEFORE ISSUING A FINAL NEGATIVE REPORT Performed at [facility]    Report Status PENDING  Incomplete  MRSA PCR Screening     Status: None   Collection Time: 02/25/15  8:13 PM  Result Value Ref Range Status   MRSA by PCR NEGATIVE NEGATIVE Final    Comment:        The GeneXpert MRSA Assay (FDA approved for NASAL specimens only), is one component of a comprehensive MRSA colonization surveillance program. It is not intended to diagnose MRSA infection nor to guide or monitor treatment for MRSA infections.      Studies: No results found.  Scheduled Meds: . aspirin EC  81 mg Oral Daily  . diclofenac  50 mg Oral BID  . enoxaparin (LOVENOX) injection  40 mg Subcutaneous Q24H  . fluticasone  1 spray Each Nare Daily  . gabapentin  100 mg Oral TID  . guaiFENesin  1,200 mg Oral BID  . insulin aspart  0-15 Units Subcutaneous TID WC  .  insulin glargine  10 Units Subcutaneous QHS  . piperacillin-tazobactam (ZOSYN)  IV  3.375 g Intravenous Q8H  . simvastatin  40 mg Oral Daily  . sodium chloride  3 mL Intravenous Q12H  . sodium chloride  3 mL Intravenous Q12H  . vancomycin  1,500 mg Intravenous Q24H    Continuous Infusions:    Time spent:  Jackson Donaghey MD, PhD  [facility] Pager [phone]. If 7PM-7AM, please contact night-coverage at [email or web address], password [facility] 02/27/2015, 11:37 AM  LOS: 2 days

## 2015-02-27 NOTE — Care Management Note (Signed)
Case Management Note  Patient Details  Name: Jackson [name] MRN: [id number] Date of Birth: 11-30-1928  Subjective/Objective:                 CM following for progression and d/c planning.   Action/Plan: 02/27/2015 Met with pt and wife re d/c planning and need for outpatient Sleep Study post d/c. As this pt is a pt of Dr Michail Sermon with the [facility] they would prefer that his sleep study be done at the Sleep study center. This CM contacted the center on [address] and spoke with Jackson [name], who was familiar with this pt , Jackson [name] and will sent a note to his [facility] physician requesting that the order be placed by Dr Michail Sermon.  She will inform Dr Michail Sermon of Jackson [name] current hospitalization and Jackson [name] will be able to review EPIC notes and pt progression. The Sleep Study will be schedules post d/c and the pt and/or wife will be contacted at home with the information needed.    Expected Discharge Date:                  Expected Discharge Plan:  [place]  In-House Referral:  NA  Discharge planning Services  CM Consult  Post Acute Care Choice:  Home Health Choice offered to:  Patient  DME Arranged:  Oxygen DME Agency:     HH Arranged:  RN [place] Agency:     Status of Service:     Medicare Important Message Given:    Date Medicare IM Given:    Medicare IM give by:    Date Additional Medicare IM Given:    Additional Medicare Important Message give by:     If discussed at [place] of Stay Meetings, dates discussed:    Additional Comments:  Adron Bene, RN 02/27/2015, 1:43 PM

## 2015-02-28 DIAGNOSIS — E1165 Type 2 diabetes mellitus with hyperglycemia: Secondary | ICD-10-CM

## 2015-02-28 DIAGNOSIS — R001 Bradycardia, unspecified: Secondary | ICD-10-CM

## 2015-02-28 LAB — CBC
HCT: 35 % — ABNORMAL LOW (ref 39.0–52.0)
HEMOGLOBIN: 12 g/dL — AB (ref 13.0–17.0)
MCH: 31.7 pg (ref 26.0–34.0)
MCHC: 34.3 g/dL (ref 30.0–36.0)
MCV: 92.6 fL (ref 78.0–100.0)
Platelets: 178 10*3/uL (ref 150–400)
RBC: 3.78 MIL/uL — AB (ref 4.22–5.81)
RDW: 12.5 % (ref 11.5–15.5)
WBC: 5.1 10*3/uL (ref 4.0–10.5)

## 2015-02-28 LAB — COMPREHENSIVE METABOLIC PANEL
ALBUMIN: 2.9 g/dL — AB (ref 3.5–5.0)
ALK PHOS: 57 U/L (ref 38–126)
ALT: 21 U/L (ref 17–63)
ANION GAP: 7 (ref 5–15)
AST: 15 U/L (ref 15–41)
BILIRUBIN TOTAL: 0.8 mg/dL (ref 0.3–1.2)
BUN: 13 mg/dL (ref 6–20)
CO2: 30 mmol/L (ref 22–32)
Calcium: 9.3 mg/dL (ref 8.9–10.3)
Chloride: 104 mmol/L (ref 101–111)
Creatinine, Ser: 1.18 mg/dL (ref 0.61–1.24)
GFR calc non Af Amer: 55 mL/min — ABNORMAL LOW (ref >60)
Glucose, Bld: 136 mg/dL — ABNORMAL HIGH (ref 65–99)
Potassium: 4.3 mmol/L (ref 3.5–5.1)
Sodium: 141 mmol/L (ref 135–145)
TOTAL PROTEIN: 6.3 g/dL — AB (ref 6.5–8.1)

## 2015-02-28 LAB — GLUCOSE, CAPILLARY
Glucose-Capillary: 121 mg/dL — ABNORMAL HIGH (ref 65–99)
Glucose-Capillary: 189 mg/dL — ABNORMAL HIGH (ref 65–99)

## 2015-02-28 MED ORDER — INSULIN ASPART 100 UNIT/ML FLEXPEN: PEN_INJECTOR | SUBCUTANEOUS | Status: DC

## 2015-02-28 MED ORDER — INSULIN STARTER KIT- PEN NEEDLES (ENGLISH)
1.0000 | Freq: Once | Status: AC
  Administered 2015-02-28: 1
  Filled 2015-02-28: qty 1

## 2015-02-28 MED ORDER — FLUTICASONE PROPIONATE 50 MCG/ACT NA SUSP: 1.0000 | Freq: Every day | NASAL | Status: DC

## 2015-02-28 MED ORDER — INSULIN GLARGINE 100 UNIT/ML SOLOSTAR PEN
10.0000 [IU] | PEN_INJECTOR | Freq: Every day | SUBCUTANEOUS | Status: DC
Start: 2015-02-28 — End: 2017-12-17

## 2015-02-28 MED ORDER — DOXYCYCLINE HYCLATE 100 MG PO TABS: 100.0000 mg | ORAL_TABLET | Freq: Two times a day (BID) | ORAL | Status: DC

## 2015-02-28 NOTE — Discharge Summary (Addendum)
Discharge Summary  Jackson [name] ZOX:[id number] DOB: [birth year]  PCP: Kristie Cowman, MD  Admit date: 02/25/2015 Discharge date: 02/28/2015  Time spent: >63mins  Recommendations for Outpatient Follow-up:  1. F/u with PMD within two weeks, pmd to repeat cxr two view to ensure resolution of left basilar opacities. pmd to monitor blood sugar, newly started on insulin 2. F/u with cardiology for holter monitoring, reported nocturnal bradycardia into the 30's. 3. Outpatient sleep study, result report to PMD.  Discharge Diagnoses:  Active Hospital Problems   Diagnosis Date Noted  . Severe sepsis 02/25/2015  . Bilateral pneumonia 02/25/2015  . Diabetes mellitus 02/25/2015  . Lactic acidosis 02/25/2015  . Hypoxia 02/25/2015  . Sepsis 02/25/2015  . Acute respiratory failure with hypoxia   . CAP (community acquired pneumonia)   . SOB (shortness of breath)   . Type 2 diabetes mellitus with hyperglycemia     Resolved Hospital Problems   Diagnosis Date Noted Date Resolved  No resolved problems to display.    Discharge Condition: stable  Diet recommendation: heart healthy/carb modified  Filed Weights   02/26/15 2100 02/27/15 2100 02/28/15 0500  Weight: 79.606 kg (175 lb 8 oz) 79.8 kg (175 lb 14.8 oz) 79.8 kg (175 lb 14.8 oz)    History of present illness:  Jackson [name] is a 81 y.o. male, with a past medical history of DM and arthritis who presents to the [facility] ER with sepsis including nausea, vomiting, hypoxemia and fever of 102.2. History is gathered from Jackson [name] and his wife. Day before yesterday he was fine. Yesterday he felt weak and tired. He had nausea GERD symptoms and diarrhea. This morning at approximately 3 AM he woke up "shaking like a leaf" he got up to go to the bathroom and when he returned to the bed he began vomiting. His emesis was brown with streaks of blood. Per his wife he vomited 6 times at home, a couple times in the ambulance, and 2 more times in the ER  once he was here. Jackson [name] is normally up and about. He rarely uses a cane. He is independent with ADLs and lives at home with his wife and daughter.  In the emergency department oxygen sats on room air were 85%. Once he was placed on 6 L nasal cannula he began satting at 97-100%. White count is normal, temperature is 102.2. Respirations are 35. Lactic acid is 3.05. Chest x-ray shows bilateral pneumonia with peribronchial cuffing and possible pulmonary edema.   Hospital Course:  Principal Problem:   Severe sepsis Active Problems:   Bilateral pneumonia   Diabetes mellitus   Lactic acidosis   Hypoxia   Sepsis   Acute respiratory failure with hypoxia   CAP (community acquired pneumonia)   SOB (shortness of breath)   Type 2 diabetes mellitus with hyperglycemia  Sepsis with lactic acidosis - he had lactic acidosis, hypoxemia (85% on room air), Fever 102, tachypnea on admission - sepsis physiology improved this morning, he is stable on nasal cannula -resolved with abx as described  below  Bilateral pneumonia - started on vancomycin and zosyn on admission on 6/6,  narrowed to oral doxycycline from 6/8,  flutter valve, ambulate check o2 sats - strep penumo negative, legionella negative -discharged on doxycycline for 5 more days to finish abx treatment course. -pmd to repeat cxr  2view to ensure resolution of pna,.improved but persistent left basilar crackles on exam.   Acute respiratory failure with hypoxemia - ?component on pulmonary edema on  CXR, 2D echo adequate LVEF, technically difficult to access diastolic dysfunction. Clinically dry. No need of lasix in the hospital -continue to wean oxygen, need outpatient sleep study due to observed desaturation during sleep -nocturnal hypoxia, discharge on home oxygen  Diabetes Mellitus with neuropathy - d/c oral meds, started on insulin from this admission and discharged on insulin - A1C 9.4  Worsening cr:  Improved, back to baseline at  discharge, possible from abx, UA unremarkable.  Sleep apnea? Nocturnal bradycardia? Outpatient sleep study scheduled, cardiology follow up for holter monitor, home oxygen at night arranged.  Diet: Diet heart healthy/carb modified Room service appropriate?: Yes; Fluid consistency:: Thin  Code Status: DNR Family Communication: d/w patient and wife at bedside   Disposition Plan: d/c home with HH/PT/home oxygen/RN   Consultants:  none  Procedures:  none  Antibiotics: Vanc/zosyn from admission to 6/8 Doxycycline from 6/8   Discharge Exam: BP 139/54 mmHg  Pulse 65  Temp(Src) 97.9 F (36.6 C) (Oral)  Resp 17  Ht  (1.727 m)  Wt 79.8 kg (175 lb 14.8 oz)  BMI 26.76 kg/m2  SpO2 91%   General: NAD  Cardiovascular: RRR  Respiratory: left basilar crackles , no wheezing, no rhonchi  Abdomen: Soft/ND/NT, positive BS  Musculoskeletal: No Edema  Neuro: aaox3   Discharge Instructions You were cared for by a hospitalist during your hospital stay. If you have any questions about your discharge medications or the care you received while you were in the hospital after you are discharged, you can call the unit and asked to speak with the hospitalist on call if the hospitalist that took care of you is not available. Once you are discharged, your primary care physician will handle any further medical issues. Please note that NO REFILLS for any discharge medications will be authorized once you are discharged, as it is imperative that you return to your primary care physician (or establish a relationship with a primary care physician if you do not have one) for your aftercare needs so that they can reassess your need for medications and monitor your lab values.      Discharge Instructions    Diet - low sodium heart healthy    Complete by:  As directed      Face-to-face encounter (required for Medicare/Medicaid patients)    Complete by:  As directed   I Jackson [name] certify that this  patient is under my care and that I, or a nurse practitioner or physician's assistant working with me, had a face-to-face encounter that meets the physician face-to-face encounter requirements with this patient on 02/28/2015. The encounter with the patient was in whole, or in part for the following medical condition(s) which is the primary reason for home health care (List medical condition): FTT, hypoxic respiratory failure  The encounter with the patient was in whole, or in part, for the following medical condition, which is the primary reason for home health care:  hypoxic respiratory failure, FTT  I certify that, based on my findings, the following services are medically necessary home health services:  Physical therapy  Reason for Medically Necessary Home Health Services:  Skilled Nursing- Change/Decline in Patient Status  My clinical findings support the need for the above services:  Shortness of breath with activity  Further, I certify that my clinical findings support that this patient is homebound due to:  Shortness of Breath with activity     For home use only DME oxygen    Complete by:  As directed  Mode or (Route):  Nasal cannula  Liters per Minute:  2  Frequency:  Only at night (stationary unit needed)  Oxygen conserving device:  Yes  Oxygen delivery system:  Gas     Home Health    Complete by:  As directed   To provide the following care/treatments:   PT RN    Newly started on insulin     Increase activity slowly    Complete by:  As directed             Medication List    STOP taking these medications        diclofenac 50 MG tablet  Commonly known as:  CATAFLAM     glimepiride 4 MG tablet  Commonly known as:  AMARYL      TAKE these medications        albuterol 108 (90 BASE) MCG/ACT inhaler  Commonly known as:  PROVENTIL HFA;VENTOLIN HFA  Inhale 2 puffs into the lungs every 4 (four) hours as needed for wheezing or shortness of breath.     aspirin EC 81 MG tablet   Take 81 mg by mouth daily.     doxycycline 100 MG tablet  Commonly known as:  VIBRA-TABS  Take 1 tablet (100 mg total) by mouth 2 (two) times daily.     fluticasone 50 MCG/ACT nasal spray  Commonly known as:  FLONASE  Place 1 spray into both nostrils daily.     gabapentin 100 MG capsule  Commonly known as:  NEURONTIN  Take 100 mg by mouth 3 (three) times daily.     insulin aspart 100 UNIT/ML FlexPen  Commonly known as:  NOVOLOG FLEXPEN  Before each meal 3 times a day, 140-199 - 2 units, 200-250 - 4 units, 251-299 - 6 units,  300-349 - 8 units,  350 or above 10 units.     Insulin Glargine 100 UNIT/ML Solostar Pen  Commonly known as:  LANTUS SOLOSTAR  Inject 10 Units into the skin daily at 10 pm.     MULTIVITAMIN PO  Take 1 tablet by mouth daily.     simvastatin 40 MG tablet  Commonly known as:  ZOCOR  Take 40 mg by mouth daily.     sitaGLIPtin 100 MG tablet  Commonly known as:  JANUVIA  Take 100 mg by mouth at bedtime.     tetrahydrozoline-zinc 0.05-0.25 % ophthalmic solution  Commonly known as:  VISINE-AC  Place 2 drops into both eyes 3 (three) times daily as needed (dry eyes).     VITAMIN D PO  Take 1 capsule by mouth daily.       Allergies  Allergen Reactions  . Actos [Pioglitazone] Swelling   Follow-up Information    Please follow up.   Why:  [facility] [address] will contact this pt and schedule his sleep study.  Phone # [phone]      Follow up with Kristie Cowman, MD In 2 weeks.   Specialty:  Internal Medicine   Why:  hospital discharge follow up, repeat cxr two view in two weeks to ensure resolution of left basilar opacity   Contact information:   [address] [place] [address] [phone]       Follow up with Donato Schultz, MD In 1 week.   Specialty:  Cardiology   Why:  holter monitor, noctural bradycardia   Contact information:   [address] [place] [address] [phone]  The results of significant diagnostics from this hospitalization (including imaging, microbiology, ancillary and laboratory) are listed below for reference.    Significant Diagnostic Studies: X-ray Chest Pa And Lateral  02/26/2015   CLINICAL DATA:  Sepsis.  Shortness of breath.  EXAM: CHEST  2 VIEW  COMPARISON:  02/25/2015  FINDINGS: Cardiomediastinal silhouette is unchanged. Anterior eventration of the right hemidiaphragm is again seen. Vascular congestion and bilateral lung opacities on the prior study have greatly improved. Mild peribronchial cuffing and patchy left perihilar and bibasilar opacities remain. No sizable pleural effusion or pneumothorax is identified. No acute osseous abnormality is seen.  IMPRESSION: Resolved vascular congestion and decreased bilateral lung opacities, suggestive of improved edema. Patchy left perihilar and bibasilar opacities remain, and superimposed pneumonia is possible.   Electronically Signed   By: Sebastian Ache   On: 02/26/2015 08:38   Dg Chest Port 1 View  02/25/2015   CLINICAL DATA:  Sepsis, shortness of breath.  EXAM: PORTABLE CHEST - 1 VIEW  COMPARISON:  Frontal and lateral views 12/25/2013  FINDINGS: There are ill-defined left perihilar opacities. Ill-defined right lower lobe opacity, with adjacent eventration of right hemidiaphragm. Cardiomediastinal contours are unchanged allowing for differences in technique. Mild vascular congestion and peribronchial cuffing. No large pleural effusion or pneumothorax.  IMPRESSION: 1. Left perihilar and right lower lobe opacity, concerning for multifocal pneumonia. 2. Peribronchial cuffing, suspect pulmonary edema.   Electronically Signed   By: Rubye Oaks M.D.   On: 02/25/2015 06:17    Microbiology: Recent Results (from the past 240 hour(s))  Blood Culture (routine x 2)     Status: None (Preliminary result)   Collection Time: 02/25/15  6:08 AM  Result Value Ref Range Status   Specimen Description BLOOD BLOOD  LEFT FOREARM  Final   Special Requests BAA  Final   Culture   Final           BLOOD CULTURE RECEIVED NO GROWTH TO DATE CULTURE WILL BE HELD FOR 5 DAYS BEFORE ISSUING A FINAL NEGATIVE REPORT Performed at [facility]    Report Status PENDING  Incomplete  Urine culture     Status: None   Collection Time: 02/25/15  6:28 AM  Result Value Ref Range Status   Specimen Description URINE, CLEAN CATCH  Final   Special Requests NONE  Final   Colony Count NO GROWTH Performed at [facility]   Final   Culture NO GROWTH Performed at [facility]   Final   Report Status 02/26/2015 FINAL  Final  Blood Culture (routine x 2)     Status: None (Preliminary result)   Collection Time: 02/25/15  6:38 AM  Result Value Ref Range Status   Specimen Description BLOOD ARM RIGHT  Final   Special Requests BAA 5CC  Final   Culture   Final           BLOOD CULTURE RECEIVED NO GROWTH TO DATE CULTURE WILL BE HELD FOR 5 DAYS BEFORE ISSUING A FINAL NEGATIVE REPORT Performed at [facility]    Report Status PENDING  Incomplete  MRSA PCR Screening     Status: None   Collection Time: 02/25/15  8:13 PM  Result Value Ref Range Status   MRSA by PCR NEGATIVE NEGATIVE Final    Comment:        The GeneXpert MRSA Assay (FDA approved for NASAL specimens only), is one component of a comprehensive MRSA colonization surveillance program. It is not intended to diagnose MRSA  infection nor to guide or monitor treatment for MRSA infections.      Labs: Basic Metabolic Panel:  Recent Labs Lab 02/25/15 0608 02/26/15 0322 02/27/15 0528 02/28/15 0530  NA 137 140 138 141  K 4.2 4.1 4.1 4.3  CL 100* 103 103 104  CO2 24 29 26 30   GLUCOSE 221* 119* 157* 136*  BUN 19 17 18 13   CREATININE 1.24 1.36* 1.36* 1.18  CALCIUM 9.2 8.9 9.0 9.3   Liver Function Tests:  Recent Labs Lab 02/25/15 0608 02/28/15 0530  AST 22 15  ALT 22 21  ALKPHOS 61 57  BILITOT 0.9 0.8  PROT 6.7 6.3*   ALBUMIN 3.7 2.9*    Recent Labs Lab 02/25/15 0608  LIPASE 17*   No results for input(s): AMMONIA in the last 168 hours. CBC:  Recent Labs Lab 02/25/15 0608 02/26/15 0322 02/28/15 0530  WBC 6.5 9.6 5.1  NEUTROABS 5.1  --   --   HGB 14.9 12.0* 12.0*  HCT 42.0 35.0* 35.0*  MCV 91.5 93.1 92.6  PLT 180 168 178   Cardiac Enzymes: No results for input(s): CKTOTAL, CKMB, CKMBINDEX, TROPONINI in the last 168 hours. BNP: BNP (last 3 results) No results for input(s): BNP in the last 8760 hours.  ProBNP (last 3 results) No results for input(s): PROBNP in the last 8760 hours.  CBG:  Recent Labs Lab 02/27/15 1140 02/27/15 1634 02/27/15 2056 02/28/15 0740 02/28/15 1244  GLUCAP 209* 206* 165* 121* 189*       Signed:  Tyrina Hines MD, PhD  [facility] 02/28/2015, 4:03 PM

## 2015-02-28 NOTE — Care Management Note (Signed)
Case Management Note  Patient Details  Name: MERLEN GURRY MRN: [id number] Date of Birth: 1928-12-19  Subjective/Objective:           CM following for progression and d/c planning.         Action/Plan: 02/27/2015 Met with pt and wife and arranged outpatient sleep study. 02/28/2015 Met with pt and wife to discuss [place] needs, they are declining HHPT however wish to have [facility] for insulin teaching.   Expected Discharge Date:       02/28/2015           Expected Discharge Plan:  [place]  In-House Referral:  NA  Discharge planning Services  CM Consult  Post Acute Care Choice:  Home Health Choice offered to:  Patient  DME Arranged:  Oxygen DME Agency:  [place]:  RN [facility] Agency:  [place]  Status of Service:     Medicare Important Message Given:  Yes Date Medicare IM Given:  02/28/15 Medicare IM give by:  Jasmine Pang RN MPH, case manager, [phone] Date Additional Medicare IM Given:    Additional Medicare Important Message give by:     If discussed at [place] of Stay Meetings, dates discussed:    Additional Comments:  Adron Bene, RN 02/28/2015, 1:51 PM

## 2015-02-28 NOTE — Progress Notes (Signed)
SATURATION QUALIFICATIONS:   Patient Saturations on Room Air at Rest = 93% Patient Saturations on Room Air while Ambulating = 88% Patient Saturations on 2 Liters of oxygen while Ambulating = 94%  Please briefly explain why patient needs home oxygen:  Patient's saturation drops while ambulating on room air.

## 2015-02-28 NOTE — Progress Notes (Signed)
Patient Discharge:  Disposition: Pt discharged home with wife.  Education: Pt educated on medication changes, follow up appointments and all discharge instructions. Pt educated on insulin administration. Pt watched videos and given handouts. Pt verbalized understanding.  IV: Removed  Telemetry: Removed. CCMD notified  Follow-up appointments: Reviewed with pt  Prescriptions: Scripts sent to pt's pharmacy  Transportation: Transported home by wife  Belongings: All belongings taken with pt

## 2015-02-28 NOTE — Care Management Note (Signed)
Case Management Note  Patient Details  Name: Jackson [name] MRN: [id number] Date of Birth: 03/23/1929  Subjective/Objective:            CM following for progression and d/c planning        Action/Plan: 02/27/2015 Sleep study arranged thru [facility] per pt request. 03/01/2015 IM given.   Expected Discharge Date:       03/01/2015           Expected Discharge Plan:  Home w Home Health Services  In-House Referral:  NA  Discharge planning Services  CM Consult  Post Acute Care Choice:  Home Health Choice offered to:  Patient  DME Arranged:  Oxygen DME Agency:     HH Arranged:  RN HH Agency:     Status of Service:     Medicare Important Message Given:  Yes Date Medicare IM Given:  02/28/15 Medicare IM give by:  Johny Shock RN MPH, case manager, [phone] Date Additional Medicare IM Given:    Additional Medicare Important Message give by:     If discussed at Long Length of Stay Meetings, dates discussed:    Additional Comments:  Starlyn Skeans, RN 02/28/2015, 11:40 AM

## 2015-03-03 LAB — CULTURE, BLOOD (ROUTINE X 2)
CULTURE: NO GROWTH
Culture: NO GROWTH

## 2015-03-06 NOTE — Progress Notes (Signed)
Cardiology Office Note   Date:  03/07/2015   ID:  Jackson [name], DOB 1929-07-29, MRN [id number]  PCP:  Kristie Cowman, MD  Cardiologist:  New - Dr. Sherryl Manges       Chief Complaint  Patient presents with  . Bradycardia     History of Present Illness: Jackson [name] is a 81 y.o. male with a hx of diabetes with peripheral neuropathy, HL.  He was admitted 6/6-6/9 with sepsis in the setting of bilateral community acquired pneumonia.  CXR did demonstrate superimposed edema as well.  Echo demonstrated vigorous LVF with EF 65-70%, no RWMA, indeterminate diastolic function, mild MR, mild LAE.  He was noted to have nocturnal desaturation and OP sleep study was recommended.  Bradycardia was noted on telemetry. I have reviewed his telemetry from the hospital and this demonstrates sinus rhythm, sinus bradycardia, episodes of Mobitz type I. There appears to be some episodes of 2:1 block as well. Lowest heart rates recorded in the 30s. Bradycardia was noted during sleep.  He presents for further evaluation.  He is feeling better since DC from the hospital.  He has seen his PCP. OP sleep testing has been ordered. He admits to a hx of snoring.  His wife notes apnea during sleep. He admits to daytime hypersomnolence.  Since DC, his wife has checked his O2 sats and noted HR in the 30s during sleep.  He is gaining energy since DC.  Denies cough.  Denies any hx of chest pain.  He does not have significant DOE.  He is NYHA 2-2b.  He denies orthopnea, PND.  He has some ankle edema.  He denies any hx of syncope, near syncope, dizziness, decreased exercise tolerance.     Studies/Reports Reviewed Today:  Echo 02/26/15 - Left ventricle: The cavity size was normal. Wall thickness was increased in a pattern of moderate LVH. Systolic function was vigorous. The estimated ejection fraction was in the range of 65% to 70%. Wall motion was normal; there were no regional wall motion abnormalities. The study  is not technically sufficient to allow evaluation of LV diastolic function. - Mitral valve: There was mild regurgitation. - Left atrium: The atrium was mildly dilated.   Past Medical History  Diagnosis Date  . Diabetes mellitus without complication   . DJD (degenerative joint disease)     knee, hip  . Diabetic neuropathy   . Phlebitis of left leg   . HLD (hyperlipidemia)   . Pneumonia 02/2015    admx with sepsis   . GERD (gastroesophageal reflux disease)     Past Surgical History  Procedure Laterality Date  . Circumcision       Current Outpatient Prescriptions  Medication Sig Dispense Refill  . aspirin EC 81 MG tablet Take 81 mg by mouth daily.    . Cholecalciferol (VITAMIN D PO) Take 1 capsule by mouth daily.    Marland Kitchen doxycycline (VIBRA-TABS) 100 MG tablet Take 1 tablet (100 mg total) by mouth 2 (two) times daily. 10 tablet 0  . gabapentin (NEURONTIN) 100 MG capsule Take 100 mg by mouth 3 (three) times daily.     . insulin aspart (NOVOLOG FLEXPEN) 100 UNIT/ML FlexPen Before each meal 3 times a day, 140-199 - 2 units, 200-250 - 4 units, 251-299 - 6 units,  300-349 - 8 units,  350 or above 10 units. 15 mL 11  . Insulin Glargine (LANTUS SOLOSTAR) 100 UNIT/ML Solostar Pen Inject 10 Units into the skin daily at 10 pm.  15 mL 11  . Multiple Vitamins-Minerals (MULTIVITAMIN PO) Take 1 tablet by mouth daily.    . simvastatin (ZOCOR) 40 MG tablet Take 40 mg by mouth daily.    Marland Kitchen tetrahydrozoline-zinc (VISINE-AC) 0.05-0.25 % ophthalmic solution Place 2 drops into both eyes 3 (three) times daily as needed (dry eyes).     No current facility-administered medications for this visit.    Allergies:   Actos    Social History:  The patient  reports that he has quit smoking. He has never used smokeless tobacco. He reports that he does not drink alcohol or use illicit drugs.   Family History:  The patient's family history includes Diabetes in his brother, mother, and sister. There is no history  of Heart attack, Stroke, or Hypertension.    ROS:   Please see the history of present illness.   Review of Systems  Constitution: Positive for chills and fever.  Respiratory: Positive for cough and snoring.   Hematologic/Lymphatic: Bruises/bleeds easily.  Gastrointestinal: Positive for nausea and vomiting.  All other systems reviewed and are negative.    PHYSICAL EXAM: VS:  BP 150/50 mmHg  Pulse 61  Ht 5\' 8"  (1.727 m)  Wt 178 lb (80.74 kg)  BMI 27.07 kg/m2  SpO2 97%    Wt Readings from Last 3 Encounters:  03/07/15 178 lb (80.74 kg)  02/28/15 175 lb 14.8 oz (79.8 kg)     GEN: Well nourished, well developed, in no acute distress HEENT: normal Neck: no JVD, no carotid bruits, no masses Cardiac:  Normal S1/S2, RRR; 2/6 systolic murmur LSB ,  no rubs or gallops, trace ankle edema L > R; FA 2+ bilaterally with no bruits Respiratory:  clear to auscultation bilaterally, no wheezing, rhonchi or rales. GI: soft, nontender, nondistended, + BS MS: no deformity or atrophy Skin: warm and dry  Neuro:  CNs II-XII intact, Strength and sensation are intact Psych: Normal affect   EKG:  EKG is ordered today.  It demonstrates:   NSR, HR 61, normal axis, first-degree AV block (PR interval 364 ms), inferior Q waves, poor R-wave progression   Recent Labs: 02/28/2015: ALT 21; BUN 13; Creatinine, Ser 1.18; Hemoglobin 12.0*; Platelets 178; Potassium 4.3; Sodium 141    Lipid Panel No results found for: CHOL, TRIG, HDL, CHOLHDL, VLDL, LDLCALC, LDLDIRECT    ASSESSMENT AND PLAN:  Bradycardia:  He has evidence of 1st degree AVB as well as Mobitz 1 and 2:1 block.  Mobitz 1 and 2:1 block occurs in the context of sleeping.  It is more likely that his 2:1 block is from the level of Mobitz 1 (AV node).  He has no hx of syncope.  I reviewed this with Dr. Sherryl Manges today who also saw the patient.  No class 1 indication for pacemaker implantation.  He has symptoms of sleep apnea.  No further cardiac  testing is needed. He should undergo sleep testing as planned with treatment of OSA if significant.  He can FU with cardiology as needed.  He should follow up sooner if he has syncope, near syncope or decreased exercise tolerance.   Type 2 diabetes mellitus with complication:  FU with PCP.  CAP (community acquired pneumonia):  Resolved.    Medication Changes: Current medicines are reviewed at length with the patient today.  Concerns regarding medicines are as outlined above.  The following changes have been made:   Discontinued Medications   ALBUTEROL (PROVENTIL HFA;VENTOLIN HFA) 108 (90 BASE) MCG/ACT INHALER    Inhale  2 puffs into the lungs every 4 (four) hours as needed for wheezing or shortness of breath.   FLUTICASONE (FLONASE) 50 MCG/ACT NASAL SPRAY    Place 1 spray into both nostrils daily.   SITAGLIPTIN (JANUVIA) 100 MG TABLET    Take 100 mg by mouth at bedtime.   Modified Medications   No medications on file   New Prescriptions   No medications on file     Labs/ tests ordered today include:   Orders Placed This Encounter  Procedures  . EKG 12-Lead    Disposition:   FU with Dr. Sherryl Manges as needed.    Signed, Brynda Rim, MHS 03/07/2015 5:07 PM    [facility] [address] [place], [place], [place]  [address] Phone: [phone]; Fax: [phone]

## 2015-03-07 ENCOUNTER — Encounter: Payer: Self-pay | Admitting: Physician Assistant

## 2015-03-07 ENCOUNTER — Ambulatory Visit (INDEPENDENT_AMBULATORY_CARE_PROVIDER_SITE_OTHER): Payer: Medicare Other | Admitting: Physician Assistant

## 2015-03-07 VITALS — BP 150/50 | HR 61 | Ht 68.0 in | Wt 178.0 lb

## 2015-03-07 DIAGNOSIS — J189 Pneumonia, unspecified organism: Secondary | ICD-10-CM

## 2015-03-07 DIAGNOSIS — R001 Bradycardia, unspecified: Secondary | ICD-10-CM

## 2015-03-07 DIAGNOSIS — E118 Type 2 diabetes mellitus with unspecified complications: Secondary | ICD-10-CM

## 2015-03-07 NOTE — Patient Instructions (Signed)
Medication Instructions:  Your physician recommends that you continue on your current medications as directed. Please refer to the Current Medication list given to you today.   Labwork: NONE  Testing/Procedures: NONE  Follow-Up: AS NEEDED WITH DR. Graciela Husbands;   Any Other Special Instructions Will Be Listed Below (If Applicable). IF YOU DO FIND YOU EXERCISE TOLERANCE DECREASES OR YOU DO PASS OUT AGAIN OR COME CLOSE TO PASSING OUT YOU WILL THEN NEED TO CALL [phone] TO MAKE AN APPT WITH DR. Graciela Husbands

## 2015-03-19 ENCOUNTER — Other Ambulatory Visit: Payer: Self-pay | Admitting: Internal Medicine

## 2015-03-19 ENCOUNTER — Ambulatory Visit
Admission: RE | Admit: 2015-03-19 | Discharge: 2015-03-19 | Disposition: A | Discharge status: Home / Self Care | Payer: Medicare Other | Source: Ambulatory Visit | Attending: Internal Medicine | Admitting: Internal Medicine

## 2015-03-19 DIAGNOSIS — J690 Pneumonitis due to inhalation of food and vomit: Secondary | ICD-10-CM

## 2015-10-22 DIAGNOSIS — Z794 Long term (current) use of insulin: Secondary | ICD-10-CM | POA: Diagnosis not present

## 2015-10-22 DIAGNOSIS — E113299 Type 2 diabetes mellitus with mild nonproliferative diabetic retinopathy without macular edema, unspecified eye: Secondary | ICD-10-CM | POA: Diagnosis not present

## 2015-10-22 DIAGNOSIS — E1140 Type 2 diabetes mellitus with diabetic neuropathy, unspecified: Secondary | ICD-10-CM | POA: Diagnosis not present

## 2015-10-22 DIAGNOSIS — N183 Chronic kidney disease, stage 3 (moderate): Secondary | ICD-10-CM | POA: Diagnosis not present

## 2015-12-18 DIAGNOSIS — J209 Acute bronchitis, unspecified: Secondary | ICD-10-CM | POA: Diagnosis not present

## 2016-04-17 DIAGNOSIS — E113299 Type 2 diabetes mellitus with mild nonproliferative diabetic retinopathy without macular edema, unspecified eye: Secondary | ICD-10-CM | POA: Diagnosis not present

## 2016-04-17 DIAGNOSIS — E1142 Type 2 diabetes mellitus with diabetic polyneuropathy: Secondary | ICD-10-CM | POA: Diagnosis not present

## 2016-04-17 DIAGNOSIS — Z0001 Encounter for general adult medical examination with abnormal findings: Secondary | ICD-10-CM | POA: Diagnosis not present

## 2016-04-17 DIAGNOSIS — Z79899 Other long term (current) drug therapy: Secondary | ICD-10-CM | POA: Diagnosis not present

## 2016-04-17 DIAGNOSIS — N183 Chronic kidney disease, stage 3 (moderate): Secondary | ICD-10-CM | POA: Diagnosis not present

## 2016-04-17 DIAGNOSIS — K210 Gastro-esophageal reflux disease with esophagitis: Secondary | ICD-10-CM | POA: Diagnosis not present

## 2016-04-17 DIAGNOSIS — M1990 Unspecified osteoarthritis, unspecified site: Secondary | ICD-10-CM | POA: Diagnosis not present

## 2016-04-17 DIAGNOSIS — Z1389 Encounter for screening for other disorder: Secondary | ICD-10-CM | POA: Diagnosis not present

## 2016-04-17 DIAGNOSIS — E1140 Type 2 diabetes mellitus with diabetic neuropathy, unspecified: Secondary | ICD-10-CM | POA: Diagnosis not present

## 2016-05-11 DIAGNOSIS — E119 Type 2 diabetes mellitus without complications: Secondary | ICD-10-CM | POA: Diagnosis not present

## 2016-05-28 DIAGNOSIS — Z23 Encounter for immunization: Secondary | ICD-10-CM | POA: Diagnosis not present

## 2016-06-03 DIAGNOSIS — E1142 Type 2 diabetes mellitus with diabetic polyneuropathy: Secondary | ICD-10-CM | POA: Diagnosis not present

## 2016-06-03 DIAGNOSIS — Z794 Long term (current) use of insulin: Secondary | ICD-10-CM | POA: Diagnosis not present

## 2016-06-03 DIAGNOSIS — N183 Chronic kidney disease, stage 3 (moderate): Secondary | ICD-10-CM | POA: Diagnosis not present

## 2016-06-03 DIAGNOSIS — E113299 Type 2 diabetes mellitus with mild nonproliferative diabetic retinopathy without macular edema, unspecified eye: Secondary | ICD-10-CM | POA: Diagnosis not present

## 2016-06-03 DIAGNOSIS — M1990 Unspecified osteoarthritis, unspecified site: Secondary | ICD-10-CM | POA: Diagnosis not present

## 2016-06-03 DIAGNOSIS — E1140 Type 2 diabetes mellitus with diabetic neuropathy, unspecified: Secondary | ICD-10-CM | POA: Diagnosis not present

## 2016-06-03 DIAGNOSIS — K210 Gastro-esophageal reflux disease with esophagitis: Secondary | ICD-10-CM | POA: Diagnosis not present

## 2016-06-30 DIAGNOSIS — E113311 Type 2 diabetes mellitus with moderate nonproliferative diabetic retinopathy with macular edema, right eye: Secondary | ICD-10-CM | POA: Diagnosis not present

## 2016-06-30 DIAGNOSIS — E113312 Type 2 diabetes mellitus with moderate nonproliferative diabetic retinopathy with macular edema, left eye: Secondary | ICD-10-CM | POA: Diagnosis not present

## 2016-06-30 DIAGNOSIS — E113313 Type 2 diabetes mellitus with moderate nonproliferative diabetic retinopathy with macular edema, bilateral: Secondary | ICD-10-CM | POA: Diagnosis not present

## 2016-09-30 DIAGNOSIS — E113313 Type 2 diabetes mellitus with moderate nonproliferative diabetic retinopathy with macular edema, bilateral: Secondary | ICD-10-CM | POA: Diagnosis not present

## 2016-10-12 DIAGNOSIS — D72829 Elevated white blood cell count, unspecified: Secondary | ICD-10-CM | POA: Diagnosis not present

## 2016-10-12 DIAGNOSIS — K2900 Acute gastritis without bleeding: Secondary | ICD-10-CM | POA: Diagnosis not present

## 2016-10-12 DIAGNOSIS — R110 Nausea: Secondary | ICD-10-CM | POA: Diagnosis not present

## 2016-10-12 DIAGNOSIS — E1140 Type 2 diabetes mellitus with diabetic neuropathy, unspecified: Secondary | ICD-10-CM | POA: Diagnosis not present

## 2016-10-12 DIAGNOSIS — K210 Gastro-esophageal reflux disease with esophagitis: Secondary | ICD-10-CM | POA: Diagnosis not present

## 2016-10-19 DIAGNOSIS — K210 Gastro-esophageal reflux disease with esophagitis: Secondary | ICD-10-CM | POA: Diagnosis not present

## 2016-10-19 DIAGNOSIS — R110 Nausea: Secondary | ICD-10-CM | POA: Diagnosis not present

## 2016-10-19 DIAGNOSIS — K2900 Acute gastritis without bleeding: Secondary | ICD-10-CM | POA: Diagnosis not present

## 2016-10-19 DIAGNOSIS — Z794 Long term (current) use of insulin: Secondary | ICD-10-CM | POA: Diagnosis not present

## 2016-10-19 DIAGNOSIS — E1140 Type 2 diabetes mellitus with diabetic neuropathy, unspecified: Secondary | ICD-10-CM | POA: Diagnosis not present

## 2016-10-19 DIAGNOSIS — D72829 Elevated white blood cell count, unspecified: Secondary | ICD-10-CM | POA: Diagnosis not present

## 2016-11-23 DIAGNOSIS — B353 Tinea pedis: Secondary | ICD-10-CM | POA: Diagnosis not present

## 2016-11-23 DIAGNOSIS — E1140 Type 2 diabetes mellitus with diabetic neuropathy, unspecified: Secondary | ICD-10-CM | POA: Diagnosis not present

## 2016-11-23 DIAGNOSIS — R609 Edema, unspecified: Secondary | ICD-10-CM | POA: Diagnosis not present

## 2016-11-23 DIAGNOSIS — Z794 Long term (current) use of insulin: Secondary | ICD-10-CM | POA: Diagnosis not present

## 2016-11-23 DIAGNOSIS — Z7984 Long term (current) use of oral hypoglycemic drugs: Secondary | ICD-10-CM | POA: Diagnosis not present

## 2016-11-23 DIAGNOSIS — R600 Localized edema: Secondary | ICD-10-CM | POA: Diagnosis not present

## 2016-12-15 ENCOUNTER — Other Ambulatory Visit: Payer: Self-pay

## 2016-12-15 ENCOUNTER — Ambulatory Visit (HOSPITAL_COMMUNITY): Payer: Medicare HMO | Attending: Cardiology

## 2016-12-15 ENCOUNTER — Other Ambulatory Visit: Payer: Self-pay | Admitting: Internal Medicine

## 2016-12-15 DIAGNOSIS — I340 Nonrheumatic mitral (valve) insufficiency: Secondary | ICD-10-CM | POA: Diagnosis not present

## 2016-12-15 DIAGNOSIS — I351 Nonrheumatic aortic (valve) insufficiency: Secondary | ICD-10-CM | POA: Insufficient documentation

## 2016-12-15 DIAGNOSIS — R609 Edema, unspecified: Secondary | ICD-10-CM

## 2016-12-15 DIAGNOSIS — I272 Pulmonary hypertension, unspecified: Secondary | ICD-10-CM | POA: Insufficient documentation

## 2016-12-15 DIAGNOSIS — I501 Left ventricular failure: Secondary | ICD-10-CM | POA: Diagnosis not present

## 2016-12-16 ENCOUNTER — Encounter: Payer: Self-pay | Admitting: Physician Assistant

## 2016-12-16 ENCOUNTER — Ambulatory Visit (INDEPENDENT_AMBULATORY_CARE_PROVIDER_SITE_OTHER): Payer: Medicare HMO | Admitting: Physician Assistant

## 2016-12-16 VITALS — BP 170/56 | HR 66 | Ht 68.0 in | Wt 184.8 lb

## 2016-12-16 DIAGNOSIS — I440 Atrioventricular block, first degree: Secondary | ICD-10-CM | POA: Diagnosis not present

## 2016-12-16 DIAGNOSIS — I10 Essential (primary) hypertension: Secondary | ICD-10-CM | POA: Diagnosis not present

## 2016-12-16 DIAGNOSIS — I350 Nonrheumatic aortic (valve) stenosis: Secondary | ICD-10-CM | POA: Diagnosis not present

## 2016-12-16 DIAGNOSIS — I442 Atrioventricular block, complete: Secondary | ICD-10-CM | POA: Insufficient documentation

## 2016-12-16 DIAGNOSIS — I272 Pulmonary hypertension, unspecified: Secondary | ICD-10-CM

## 2016-12-16 DIAGNOSIS — E119 Type 2 diabetes mellitus without complications: Secondary | ICD-10-CM

## 2016-12-16 DIAGNOSIS — I700 Atherosclerosis of aorta: Secondary | ICD-10-CM | POA: Diagnosis not present

## 2016-12-16 DIAGNOSIS — Z794 Long term (current) use of insulin: Secondary | ICD-10-CM | POA: Diagnosis not present

## 2016-12-16 DIAGNOSIS — R001 Bradycardia, unspecified: Secondary | ICD-10-CM | POA: Insufficient documentation

## 2016-12-16 HISTORY — DX: Atherosclerosis of aorta: I70.0

## 2016-12-16 NOTE — Progress Notes (Signed)
Cardiology Office Note:    Date:  12/16/2016   ID:  Jackson [name], DOB [birth year], MRN [id number]  PCP:  Jackson Dubonnet, MD  Cardiologist:  Dr. Sherryl [name] (saw with Jackson Newcomer, PA-C in 02/2015)   Referring MD: Jackson Cowman, MD   Chief Complaint  Patient presents with  . Bradycardia    follow up    History of Present Illness:    Jackson [name] is a 83 y.o. male with a hx of diabetes with peripheral neuropathy, HL, bradycardia. He was admitted to the hospital in 6/16 with pneumonia and sepsis. He had normal LV function on echocardiogram. Telemetry demonstrated bradycardia with heart rates as low as 30, transient Mobitz 1 and episodes of 2:1 block. Bradycardia was noted during sleep. Outpatient sleep testing was recommended for nocturnal desaturation. He was evaluated in our office by me and Dr. Graciela [name] in 6/16. He had no history of syncope. It was felt that he did not need further cardiac testing and he did not meet class I indications for pacemaker implantation. Follow-up with cardiology was recommended as needed.  He returns for follow-up upon the request of his primary care physician. He had an echocardiogram obtained yesterday. This demonstrated normal LV function, mild diastolic dysfunction and mild aortic stenosis and mild pulmonary hypertension. Jackson [name] denies significant complaints. He denies syncope, near syncope, dizziness. He does get lightheaded if he stands quickly. He's had this his whole life.  He denies chest discomfort. He denies significant dyspnea. He denies orthopnea, PND or significant edema.  Prior CV studies:   The following studies were reviewed today:   Echo 12/15/16 Mild LVH, EF 60-65, normal wall motion, grade 1 diastolic dysfunction, mild aortic stenosis (mean 12/peak 22), trivial MR, normal RVSF, PASP 47 (mild pulmonary hypertension)  Echo 02/26/15 - Left ventricle: The cavity size was normal. Wall thickness was increased in a pattern of  moderate LVH. Systolic function was vigorous. The estimated ejection fraction was in the range of 65% to 70%. Wall motion was normal; there were no regional wall motion abnormalities. The study is not technically sufficient to allow evaluation of LV diastolic function. - Mitral valve: There was mild regurgitation. - Left atrium: The atrium was mildly dilated.  Past Medical History:  Diagnosis Date  . Aortic stenosis    Mild LVH, EF 60-65, normal wall motion, grade 1 diastolic dysfunction, mild aortic stenosis (mean 12/peak 22), trivial MR, normal RVSF, PASP 47 (mild pulmonary hypertension)  . Atherosclerosis of aorta (HCC) 12/16/2016   CXR 6/16: IMPRESSION: 1. Resolved left basilar airspace opacities. 2. Hazy nodularity in both lungs compatible with the patient's known pleural plaques. 3. Right anterior hemidiaphragmatic eventration. 4. Thoracic spondylosis. 5. Atherosclerotic calcification of the aortic arch.  . Bradycardia    Low HR (30s) noted during sleep during admx for sepsis in 2016; Mobitz 1, 2:1 block >> no indication for pacer; management of OSA recommended   . Diabetes mellitus without complication (HCC)   . Diabetic neuropathy (HCC)   . DJD (degenerative joint disease)    knee, hip  . GERD (gastroesophageal reflux disease)   . History of echocardiogram    a. Echo 6/16: mod LVH, EF 65-70, vigorous LVF, no RWMA, mild MR, mild LAE  . HLD (hyperlipidemia)   . Phlebitis of left leg   . Pneumonia 02/2015   admx with sepsis     Past Surgical History:  Procedure Laterality Date  . CIRCUMCISION      Current Medications: Current Meds  Medication Sig  . aspirin EC 81 MG tablet Take 81 mg by mouth daily.  . Cholecalciferol (VITAMIN D PO) Take 1 capsule by mouth daily.  . clotrimazole (LOTRIMIN) 1 % cream Apply 1 application topically as needed (foot irritation).   Marland Kitchen doxycycline (VIBRA-TABS) 100 MG tablet Take 1 tablet (100 mg total) by mouth 2 (two) times daily.  Marland Kitchen  gabapentin (NEURONTIN) 100 MG capsule Take 100 mg by mouth 3 (three) times daily.   . insulin aspart (NOVOLOG FLEXPEN) 100 UNIT/ML FlexPen Before each meal 3 times a day, 140-199 - 2 units, 200-250 - 4 units, 251-299 - 6 units,  300-349 - 8 units,  350 or above 10 units.  . Insulin Glargine (LANTUS SOLOSTAR) 100 UNIT/ML Solostar Pen Inject 10 Units into the skin daily at 10 pm.  . lisinopril (PRINIVIL,ZESTRIL) 2.5 MG tablet Take 2.5 mg by mouth daily.   . Multiple Vitamins-Minerals (MULTIVITAMIN PO) Take 1 tablet by mouth daily.  Marland Kitchen omeprazole (PRILOSEC) 20 MG capsule Take 20 mg by mouth daily.   . simvastatin (ZOCOR) 40 MG tablet Take 40 mg by mouth daily.  Marland Kitchen tetrahydrozoline-zinc (VISINE-AC) 0.05-0.25 % ophthalmic solution Place 2 drops into both eyes 3 (three) times daily as needed (dry eyes).     Allergies:   Actos [pioglitazone]   Social History   Social History  . Marital status: Married    Spouse name: N/A  . Number of children: N/A  . Years of education: N/A   Social History Main Topics  . Smoking status: Former Games developer  . Smokeless tobacco: Never Used  . Alcohol use No  . Drug use: No  . Sexual activity: Not Asked   Other Topics Concern  . None   Social History Narrative  . None     Family History  Problem Relation Age of Onset  . Diabetes Mother   . Diabetes Sister   . Diabetes Brother   . Heart attack Neg Hx   . Stroke Neg Hx   . Hypertension Neg Hx      ROS:   Please see the history of present illness.    Review of Systems  HENT: Positive for hearing loss.   Respiratory: Positive for snoring.   Hematologic/Lymphatic: Bruises/bleeds easily.   All other systems reviewed and are negative.   EKGs/Labs/Other Test Reviewed:    EKG:  EKG is  ordered today.  The ekg ordered today demonstrates NSR, HR 66, left axis deviation, inferior Q waves, anterior Q waves, first-degree AV block, PR 360 ms, QTC 406 ms, no significant change when compared to prior  tracing  Recent Labs: No results found for requested labs within last 8760 hours.   Recent Lipid Panel No results found for: CHOL, TRIG, HDL, CHOLHDL, VLDL, LDLCALC, LDLDIRECT   Physical Exam:    VS:  BP (!) 170/56   Pulse 66   Ht 5\' 8"  (1.727 m)   Wt 184 lb 12.8 oz (83.8 kg)   BMI 28.10 kg/m     Wt Readings from Last 3 Encounters:  12/16/16 184 lb 12.8 oz (83.8 kg)  03/07/15 178 lb (80.7 kg)  02/28/15 175 lb 14.8 oz (79.8 kg)     Physical Exam  Constitutional: He is oriented to person, place, and time. He appears well-developed and well-nourished. No distress.  HENT:  Head: Normocephalic and atraumatic.  Eyes: No scleral icterus.  Neck: Normal range of motion. No JVD present. Carotid bruit is not present.  Cardiovascular: Normal rate, regular rhythm,  S1 normal and S2 normal.  Exam reveals distant heart sounds.   Murmur heard.  Crescendo-decrescendo systolic murmur is present with a grade of 2/6  at the upper right sternal border Pulmonary/Chest: Effort normal and breath sounds normal. He has no wheezes. He has no rhonchi. He has no rales.  Abdominal: Soft. There is no tenderness.  Musculoskeletal: He exhibits no edema.  Neurological: He is alert and oriented to person, place, and time.  Skin: Skin is warm and dry.  Psychiatric: He has a normal mood and affect.    ASSESSMENT:    1. Aortic valve stenosis, etiology of cardiac valve disease unspecified   2. First degree AV block   3. Atherosclerosis of aorta (HCC)   4. Type 2 diabetes mellitus without complication, with long-term current use of insulin (HCC)   5. Essential hypertension   6. Pulmonary hypertension, unspecified    PLAN:    In order of problems listed above:  1. Aortic valve stenosis, etiology of cardiac valve disease unspecified -  Mild by recent echocardiogram with mean gradient 12 mmHg. He has no symptoms to suggest significant aortic stenosis. We discussed symptoms of significant aortic stenosis.     -  Follow-up in one year with repeat echocardiogram  2. First degree AV block - His ECG is similar to previous ECG when seen in 2016. He denies symptoms of symptomatic bradycardia. Avoid AV nodal blocking agents. Follow-up one year with repeat ECG.  3. Atherosclerosis of aorta (HCC) - Seen on previous chest x-ray. Continue aspirin, statin.  4. Type 2 diabetes mellitus without complication, with long-term current use of insulin (HCC) - Continue follow-up with primary care.  5. Essential hypertension - Blood pressure not controlled. He notes more optimal blood pressures at home. I have asked him to continue to monitor these and follow-up with primary care.  6. Pulmonary hypertension, unspecified - Mild pulmonary hypertension by recent echo. This is likely age-related and also secondary to prior history of significant smoking. He denies significant dyspnea. No further workup at this time.  Dispo:  Return in about 1 year (around 12/16/2017) for Routine Follow Up, w/ Jackson Newcomer, PA-C.   Medication Adjustments/Labs and Tests Ordered: Current medicines are reviewed at length with the patient today.  Concerns regarding medicines are outlined above.  Medication changes, Labs and Tests ordered today are outlined in the Patient Instructions noted below. Patient Instructions  Medication Instructions:  Your physician recommends that you continue on your current medications as directed. Please refer to the Current Medication list given to you today.  Labwork: NONE ORDERED   Testing/Procedures: Your physician has requested that you have an echocardiogram. Echocardiography is a painless test that uses sound waves to create images of your heart. It provides your doctor with information about the size and shape of your heart and how well your heart's chambers and valves are working. This procedure takes approximately one hour. There are no restrictions for this procedure. THIS IS TO  BE DONE IN 1 YEAR  ABOUT 1 WEEK BEFORE YOUR APPT WITH Yaritsa Savarino [facility]  Follow-Up: Your physician wants you to follow-up in: 1 YEAR WITH Halleigh Comes, [facility]  You will receive a reminder letter in the mail two months in advance. If you don't receive a letter, please call our office to schedule the follow-up appointment.  Any Other Special Instructions Will Be Listed Below (If Applicable). PER Muhannad Bignell, PAC TO MAKE SURE TO FOLLOW UP WITH YOUR PRIMARY CARE PHYSICIAN IN REGARDS TO YOUR  BLOOD PRESSURE  If you need a refill on your cardiac medications before your next appointment, please call your pharmacy.  Signed, Jackson Newcomer, PA-C  12/16/2016 5:13 PM    [facility] [address] [place], [place], [place]  [address] Phone: [phone]; Fax: [phone]

## 2016-12-16 NOTE — Patient Instructions (Addendum)
Medication Instructions:  Your physician recommends that you continue on your current medications as directed. Please refer to the Current Medication list given to you today.  Labwork: NONE ORDERED   Testing/Procedures: Your physician has requested that you have an echocardiogram. Echocardiography is a painless test that uses sound waves to create images of your heart. It provides your doctor with information about the size and shape of your heart and how well your heart's chambers and valves are working. This procedure takes approximately one hour. There are no restrictions for this procedure. THIS IS TO  BE DONE IN 1 YEAR ABOUT 1 WEEK BEFORE YOUR APPT WITH [facility]  Follow-Up: Your physician wants you to follow-up in: 1 YEAR WITH SCOTT WEAVER, [facility]  You will receive a reminder letter in the mail two months in advance. If you don't receive a letter, please call our office to schedule the follow-up appointment.  Any Other Special Instructions Will Be Listed Below (If Applicable). PER SCOTT WEAVER, PAC TO MAKE SURE TO FOLLOW UP WITH YOUR PRIMARY CARE PHYSICIAN IN REGARDS TO YOUR BLOOD PRESSURE  If you need a refill on your cardiac medications before your next appointment, please call your pharmacy.

## 2017-01-27 DIAGNOSIS — E1140 Type 2 diabetes mellitus with diabetic neuropathy, unspecified: Secondary | ICD-10-CM | POA: Diagnosis not present

## 2017-01-27 DIAGNOSIS — B353 Tinea pedis: Secondary | ICD-10-CM | POA: Diagnosis not present

## 2017-01-27 DIAGNOSIS — K2900 Acute gastritis without bleeding: Secondary | ICD-10-CM | POA: Diagnosis not present

## 2017-01-27 DIAGNOSIS — R609 Edema, unspecified: Secondary | ICD-10-CM | POA: Diagnosis not present

## 2017-01-27 DIAGNOSIS — Z794 Long term (current) use of insulin: Secondary | ICD-10-CM | POA: Diagnosis not present

## 2017-01-27 DIAGNOSIS — D72829 Elevated white blood cell count, unspecified: Secondary | ICD-10-CM | POA: Diagnosis not present

## 2017-01-27 DIAGNOSIS — K210 Gastro-esophageal reflux disease with esophagitis: Secondary | ICD-10-CM | POA: Diagnosis not present

## 2017-05-11 DIAGNOSIS — R609 Edema, unspecified: Secondary | ICD-10-CM | POA: Diagnosis not present

## 2017-05-11 DIAGNOSIS — Z0000 Encounter for general adult medical examination without abnormal findings: Secondary | ICD-10-CM | POA: Diagnosis not present

## 2017-05-11 DIAGNOSIS — Z1389 Encounter for screening for other disorder: Secondary | ICD-10-CM | POA: Diagnosis not present

## 2017-05-11 DIAGNOSIS — M1990 Unspecified osteoarthritis, unspecified site: Secondary | ICD-10-CM | POA: Diagnosis not present

## 2017-05-11 DIAGNOSIS — Z23 Encounter for immunization: Secondary | ICD-10-CM | POA: Diagnosis not present

## 2017-05-11 DIAGNOSIS — E1140 Type 2 diabetes mellitus with diabetic neuropathy, unspecified: Secondary | ICD-10-CM | POA: Diagnosis not present

## 2017-05-11 DIAGNOSIS — K210 Gastro-esophageal reflux disease with esophagitis: Secondary | ICD-10-CM | POA: Diagnosis not present

## 2017-05-11 DIAGNOSIS — E11319 Type 2 diabetes mellitus with unspecified diabetic retinopathy without macular edema: Secondary | ICD-10-CM | POA: Diagnosis not present

## 2017-05-11 DIAGNOSIS — N183 Chronic kidney disease, stage 3 (moderate): Secondary | ICD-10-CM | POA: Diagnosis not present

## 2017-07-29 DIAGNOSIS — E113313 Type 2 diabetes mellitus with moderate nonproliferative diabetic retinopathy with macular edema, bilateral: Secondary | ICD-10-CM | POA: Diagnosis not present

## 2017-07-29 DIAGNOSIS — H43813 Vitreous degeneration, bilateral: Secondary | ICD-10-CM | POA: Diagnosis not present

## 2017-08-03 DIAGNOSIS — M25552 Pain in left hip: Secondary | ICD-10-CM | POA: Diagnosis not present

## 2017-08-11 ENCOUNTER — Encounter (INDEPENDENT_AMBULATORY_CARE_PROVIDER_SITE_OTHER): Payer: Self-pay | Admitting: Orthopaedic Surgery

## 2017-08-11 ENCOUNTER — Ambulatory Visit (INDEPENDENT_AMBULATORY_CARE_PROVIDER_SITE_OTHER): Payer: Medicare HMO | Admitting: Orthopaedic Surgery

## 2017-08-11 ENCOUNTER — Ambulatory Visit (INDEPENDENT_AMBULATORY_CARE_PROVIDER_SITE_OTHER): Payer: Medicare HMO

## 2017-08-11 VITALS — Ht 68.0 in | Wt 184.0 lb

## 2017-08-11 DIAGNOSIS — M1612 Unilateral primary osteoarthritis, left hip: Secondary | ICD-10-CM | POA: Diagnosis not present

## 2017-08-11 DIAGNOSIS — M25552 Pain in left hip: Secondary | ICD-10-CM | POA: Diagnosis not present

## 2017-08-11 NOTE — Progress Notes (Signed)
Office Visit Note   Patient: [name]           Date of Birth: 10-20-1928           MRN: [id number] Visit Date: 08/11/2017              Requested by: Marden Noble, MD [address] [place], [place] [address] PCP: Marden Noble, MD   Assessment & Plan: Visit Diagnoses:  1. Pain in left hip   2. Unilateral primary osteoarthritis, left hip     Plan: Given the severity of his arthritis as well as the detrimental effect this is had on his daily life he does wish to proceed with a total hip arthroplasty.  I showed him a hip model and went over his x-rays in detail.  We had a long and thorough discussion about the surgery including a thorough discussion of the risk and benefits of the surgery as well as what his intraoperative and postoperative course would include.  He does wish to have this set up in the near future.  His hemoglobin A1c is now below 7 and he is doing well from a health standpoint.  We will work on getting this scheduled in the near future.  Follow-Up Instructions: Return for 2 weeks post-op.   Orders:  Orders Placed This Encounter  Procedures  . XR HIP UNILAT W OR W/O PELVIS 1V LEFT   No orders of the defined types were placed in this encounter.     Procedures: No procedures performed   Clinical Data: No additional findings.   Subjective: Chief Complaint  Patient presents with  . Left Hip - Pain  The patient is a very pleasant 83 year old gentleman who is almost wheelchair-bound now due to severe debilitating left hip pain.  This is been slowly worsening for several years now.  He is a patient of Dr. Johnella Moloney and Dr. Kevan Ny and in my way to evaluate his hip further.  He says the pain is 10 out of 10 and is detrimentally affect his activities of daily living, quality of life, and his mobility.  He is very thin individual and this is affecting on a daily basis and has been rapidly worsening now.  It has gotten to where he can get around  well.  He says his right leg is even longer now than his painful left leg.  HPI  Review of Systems He currently denies any headache, chest pain, shortness of breath, fever, chills, nausea, vomiting  Objective: Vital Signs: Ht 5\' 8"  (1.727 m)   Wt 184 lb (83.5 kg)   BMI 27.98 kg/m   Physical Exam He is alert and oriented x3 and in no acute distress Ortho Exam Examination of his right hip is normal examination of his left hip shows severe pain with any attempts of internal or external rotation.  His hip is quite stiff and I cannot fully rotated. Specialty Comments:  No specialty comments available.  Imaging: Xr Hip Unilat W Or W/o Pelvis 1v Left  Result Date: 08/11/2017 An AP pelvis and lateral of the left hip show severe end-stage arthritis.  There is complete loss of joint space as well as sclerotic changes in the femoral head and acetabulum.  There are periarticular osteophytes as well.    PMFS History: Patient Active Problem List   Diagnosis Date Noted  . Pain in left hip 08/11/2017  . Unilateral primary osteoarthritis, left hip 08/11/2017  . Bradycardia 12/16/2016  . Atherosclerosis  of aorta (HCC) 12/16/2016  . Aortic stenosis   . Severe sepsis (HCC) 02/25/2015  . Bilateral pneumonia 02/25/2015  . Diabetes mellitus (HCC) 02/25/2015  . Lactic acidosis 02/25/2015  . Hypoxia 02/25/2015  . Sepsis (HCC) 02/25/2015  . Acute respiratory failure with hypoxia (HCC)   . CAP (community acquired pneumonia)   . SOB (shortness of breath)   . Type 2 diabetes mellitus with hyperglycemia (HCC)    Past Medical History:  Diagnosis Date  . Aortic stenosis    Mild LVH, EF 60-65, normal wall motion, grade 1 diastolic dysfunction, mild aortic stenosis (mean 12/peak 22), trivial MR, normal RVSF, PASP 47 (mild pulmonary hypertension)  . Atherosclerosis of aorta (HCC) 12/16/2016   CXR 6/16: IMPRESSION: 1. Resolved left basilar airspace opacities. 2. Hazy nodularity in both lungs  compatible with the patient's known pleural plaques. 3. Right anterior hemidiaphragmatic eventration. 4. Thoracic spondylosis. 5. Atherosclerotic calcification of the aortic arch.  . Bradycardia    Low HR (30s) noted during sleep during admx for sepsis in 2016; Mobitz 1, 2:1 block >> no indication for pacer; management of OSA recommended   . Diabetes mellitus without complication (HCC)   . Diabetic neuropathy (HCC)   . DJD (degenerative joint disease)    knee, hip  . GERD (gastroesophageal reflux disease)   . History of echocardiogram    a. Echo 6/16: mod LVH, EF 65-70, vigorous LVF, no RWMA, mild MR, mild LAE  . HLD (hyperlipidemia)   . Phlebitis of left leg   . Pneumonia 02/2015   admx with sepsis     Family History  Problem Relation Age of Onset  . Diabetes Mother   . Diabetes Sister   . Diabetes Brother   . Heart attack Neg Hx   . Stroke Neg Hx   . Hypertension Neg Hx     Past Surgical History:  Procedure Laterality Date  . CIRCUMCISION     Social History   Occupational History  . Not on file  Tobacco Use  . Smoking status: Former Games developermoker  . Smokeless tobacco: Never Used  Substance and Sexual Activity  . Alcohol use: No  . Drug use: No  . Sexual activity: Not on file

## 2017-08-20 ENCOUNTER — Telehealth (INDEPENDENT_AMBULATORY_CARE_PROVIDER_SITE_OTHER): Payer: Self-pay | Admitting: Orthopaedic Surgery

## 2017-08-20 NOTE — Telephone Encounter (Signed)
Patient's wife left a message wanting to know if you have been able to schedule her husband's surgery for his hip.  [phone].  Thank you.

## 2017-08-23 NOTE — Telephone Encounter (Signed)
Patients wife came in to check on surgery, says if it wont be sometime in January she would like to talk to Dr. Magnus Ivan about an injection. She said patient cannot get around or sleep. Please call back when possible [phone].

## 2017-08-26 NOTE — Telephone Encounter (Signed)
Spoke with wife and scheduled surgery. °

## 2017-09-08 DIAGNOSIS — J209 Acute bronchitis, unspecified: Secondary | ICD-10-CM | POA: Diagnosis not present

## 2017-09-09 ENCOUNTER — Other Ambulatory Visit (INDEPENDENT_AMBULATORY_CARE_PROVIDER_SITE_OTHER): Payer: Self-pay

## 2017-09-11 ENCOUNTER — Other Ambulatory Visit (INDEPENDENT_AMBULATORY_CARE_PROVIDER_SITE_OTHER): Payer: Self-pay | Admitting: Physician Assistant

## 2017-09-15 DIAGNOSIS — E113313 Type 2 diabetes mellitus with moderate nonproliferative diabetic retinopathy with macular edema, bilateral: Secondary | ICD-10-CM | POA: Diagnosis not present

## 2017-09-16 NOTE — Progress Notes (Addendum)
12-16-16 (Epic) EKG and LOV with Dr. Graciela Husbands cardiology. Pt to follow up in 81yr  12-15-16 (Epic) ECHO

## 2017-09-16 NOTE — Patient Instructions (Addendum)
[name]  09/16/2017   Your procedure is scheduled on: 09-24-17   Report to [name] Main  [name] Eelevators to 3rd floor to  Short Stay Center at 8:45 AM.    Call this number if you have problems the morning of surgery [phone]    Remember: ONLY 1 PERSON MAY GO WITH YOU TO SHORT STAY TO GET  READY MORNING OF YOUR SURGERY.  Do not eat food or drink liquids :After Midnight.     Take these medicines the morning of surgery with A SIP OF WATER: Omeprazole (Prilosec) and Gabapentin (Neurontin) . You may also bring and use your inhaler as needed.                                You may not have any metal on your body including hair pins and              piercings  Do not wear jewelry, lotions, powders or deodorant             Men may shave face and neck.   Do not bring valuables to the hospital. [name] IS NOT             RESPONSIBLE   FOR VALUABLES.  Contacts, dentures or bridgework may not be worn into surgery.  Leave suitcase in the car. After surgery it may be brought to your room.                Please read over the following fact sheets you were given: _____________________________________________________________________           [name] - Preparing for Surgery Before surgery, you can play an important role.  Because skin is not sterile, your skin needs to be as free of germs as possible.  You can reduce the number of germs on your skin by washing with CHG (chlorahexidine gluconate) soap before surgery.  CHG is an antiseptic cleaner which kills germs and bonds with the skin to continue killing germs even after washing. Please DO NOT use if you have an allergy to CHG or antibacterial soaps.  If your skin becomes reddened/irritated stop using the CHG and inform your nurse when you arrive at Short Stay. Do not shave (including legs and underarms) for at least 48 hours prior to the first CHG shower.  You may shave your  face/neck. Please follow these instructions carefully:  1.  Shower with CHG Soap the night before surgery and the  morning of Surgery.  2.  If you choose to wash your hair, wash your hair first as usual with your  normal  shampoo.  3.  After you shampoo, rinse your hair and body thoroughly to remove the  shampoo.                           4.  Use CHG as you would any other liquid soap.  You can apply chg directly  to the skin and wash                       Gently with a scrungie or clean washcloth.  5.  Apply the CHG Soap to your body ONLY FROM THE NECK DOWN.   Do not use on face/ open  Wound or open sores. Avoid contact with eyes, ears mouth and genitals (private parts).                       Wash face,  Genitals (private parts) with your normal soap.             6.  Wash thoroughly, paying special attention to the area where your surgery  will be performed.  7.  Thoroughly rinse your body with warm water from the neck down.  8.  DO NOT shower/wash with your normal soap after using and rinsing off  the CHG Soap.                9.  Pat yourself dry with a clean towel.            10.  Wear clean pajamas.            11.  Place clean sheets on your bed the night of your first shower and do not  sleep with pets. Day of Surgery : Do not apply any lotions/deodorants the morning of surgery.  Please wear clean clothes to the hospital/surgery center.  FAILURE TO FOLLOW THESE INSTRUCTIONS MAY RESULT IN THE CANCELLATION OF YOUR SURGERY PATIENT SIGNATURE_________________________________  NURSE SIGNATURE__________________________________  ________________________________________________________________________ How to Manage Your Diabetes Before and After Surgery  Why is it important to control my blood sugar before and after surgery? . Improving blood sugar levels before and after surgery helps healing and can limit problems. . A way of improving blood sugar control is eating  a healthy diet by: o  Eating less sugar and carbohydrates o  Increasing activity/exercise o  Talking with your doctor about reaching your blood sugar goals . High blood sugars (greater than 180 mg/dL) can raise your risk of infections and slow your recovery, so you will need to focus on controlling your diabetes during the weeks before surgery. . Make sure that the doctor who takes care of your diabetes knows about your planned surgery including the date and location.  How do I manage my blood sugar before surgery? . Check your blood sugar at least 4 times a day, starting 2 days before surgery, to make sure that the level is not too high or low. o Check your blood sugar the morning of your surgery when you wake up and every 2 hours until you get to the Short Stay unit. . If your blood sugar is less than 70 mg/dL, you will need to treat for low blood sugar: o Do not take insulin. o Treat a low blood sugar (less than 70 mg/dL) with  cup of clear juice (cranberry or apple), 4 glucose tablets, OR glucose gel. o Recheck blood sugar in 15 minutes after treatment (to make sure it is greater than 70 mg/dL). If your blood sugar is not greater than 70 mg/dL on recheck, call [phone] for further instructions. . Report your blood sugar to the short stay nurse when you get to Short Stay.  . If you are admitted to the hospital after surgery: o Your blood sugar will be checked by the staff and you will probably be given insulin after surgery (instead of oral diabetes medicines) to make sure you have good blood sugar levels. o The goal for blood sugar control after surgery is 80-180 mg/dL.   WHAT DO I DO ABOUT MY DIABETES MEDICATION?  Marland Kitchen. Do not take oral diabetes medicines (pills) the morning of surgery.  Marland Kitchen. THE  DAY BEFORE SURGERY, take only 7 units of your bedtime dose of Lantus  insulin.      . If your CBG is greater than 220 mg/dL, you may take  of your sliding scale  . (correction) dose of  insulin.   Patient Signature:  Date:   Nurse Signature:  Date:   Reviewed and Endorsed by [name] Patient Education Committee, August 2015  Incentive Spirometer  An incentive spirometer is a tool that can help keep your lungs clear and active. This tool measures how well you are filling your lungs with each breath. Taking long deep breaths may help reverse or decrease the chance of developing breathing (pulmonary) problems (especially infection) following:  A long period of time when you are unable to move or be active. BEFORE THE PROCEDURE   If the spirometer includes an indicator to show your best effort, your nurse or respiratory therapist will set it to a desired goal.  If possible, sit up straight or lean slightly forward. Try not to slouch.  Hold the incentive spirometer in an upright position. INSTRUCTIONS FOR USE  1. Sit on the edge of your bed if possible, or sit up as far as you can in bed or on a chair. 2. Hold the incentive spirometer in an upright position. 3. Breathe out normally. 4. Place the mouthpiece in your mouth and seal your lips tightly around it. 5. Breathe in slowly and as deeply as possible, raising the piston or the ball toward the top of the column. 6. Hold your breath for 3-5 seconds or for as long as possible. Allow the piston or ball to fall to the bottom of the column. 7. Remove the mouthpiece from your mouth and breathe out normally. 8. Rest for a few seconds and repeat Steps 1 through 7 at least 10 times every 1-2 hours when you are awake. Take your time and take a few normal breaths between deep breaths. 9. The spirometer may include an indicator to show your best effort. Use the indicator as a goal to work toward during each repetition. 10. After each set of 10 deep breaths, practice coughing to be sure your lungs are clear. If you have an incision (the cut made at the time of surgery), support your incision when coughing by placing a pillow or  rolled up towels firmly against it. Once you are able to get out of bed, walk around indoors and cough well. You may stop using the incentive spirometer when instructed by your caregiver.  RISKS AND COMPLICATIONS  Take your time so you do not get dizzy or light-headed.  If you are in pain, you may need to take or ask for pain medication before doing incentive spirometry. It is harder to take a deep breath if you are having pain. AFTER USE  Rest and breathe slowly and easily.  It can be helpful to keep track of a log of your progress. Your caregiver can provide you with a simple table to help with this. If you are using the spirometer at home, follow these instructions: SEEK MEDICAL CARE IF:   You are having difficultly using the spirometer.  You have trouble using the spirometer as often as instructed.  Your pain medication is not giving enough relief while using the spirometer.  You develop fever of 100.5 F (38.1 C) or higher. SEEK IMMEDIATE MEDICAL CARE IF:   You cough up bloody sputum that had not been present before.  You develop fever of 102  F (38.9 C) or greater.  You develop worsening pain at or near the incision site. MAKE SURE YOU:   Understand these instructions.  Will watch your condition.  Will get help right away if you are not doing well or get worse. Document Released: 01/18/2007 Document Revised: 11/30/2011 Document Reviewed: 03/21/2007 ExitCare Patient Information 2014 ExitCare, [place].   ________________________________________________________________________  WHAT IS A BLOOD TRANSFUSION? Blood Transfusion Information  A transfusion is the replacement of blood or some of its parts. Blood is made up of multiple cells which provide different functions.  Red blood cells carry oxygen and are used for blood loss replacement.  White blood cells fight against infection.  Platelets control bleeding.  Plasma helps clot blood.  Other blood products are  available for specialized needs, such as hemophilia or other clotting disorders. BEFORE THE TRANSFUSION  Who gives blood for transfusions?   Healthy volunteers who are fully evaluated to make sure their blood is safe. This is blood bank blood. Transfusion therapy is the safest it has ever been in the practice of medicine. Before blood is taken from a donor, a complete history is taken to make sure that person has no history of diseases nor engages in risky social behavior (examples are intravenous drug use or sexual activity with multiple partners). The donor's travel history is screened to minimize risk of transmitting infections, such as malaria. The donated blood is tested for signs of infectious diseases, such as HIV and hepatitis. The blood is then tested to be sure it is compatible with you in order to minimize the chance of a transfusion reaction. If you or a relative donates blood, this is often done in anticipation of surgery and is not appropriate for emergency situations. It takes many days to process the donated blood. RISKS AND COMPLICATIONS Although transfusion therapy is very safe and saves many lives, the main dangers of transfusion include:   Getting an infectious disease.  Developing a transfusion reaction. This is an allergic reaction to something in the blood you were given. Every precaution is taken to prevent this. The decision to have a blood transfusion has been considered carefully by your caregiver before blood is given. Blood is not given unless the benefits outweigh the risks. AFTER THE TRANSFUSION  Right after receiving a blood transfusion, you will usually feel much better and more energetic. This is especially true if your red blood cells have gotten low (anemic). The transfusion raises the level of the red blood cells which carry oxygen, and this usually causes an energy increase.  The nurse administering the transfusion will monitor you carefully for  complications. HOME CARE INSTRUCTIONS  No special instructions are needed after a transfusion. You may find your energy is better. Speak with your caregiver about any limitations on activity for underlying diseases you may have. SEEK MEDICAL CARE IF:   Your condition is not improving after your transfusion.  You develop redness or irritation at the intravenous (IV) site. SEEK IMMEDIATE MEDICAL CARE IF:  Any of the following symptoms occur over the next 12 hours:  Shaking chills.  You have a temperature by mouth above 102 F (38.9 C), not controlled by medicine.  Chest, back, or muscle pain.  People around you feel you are not acting correctly or are confused.  Shortness of breath or difficulty breathing.  Dizziness and fainting.  You get a rash or develop hives.  You have a decrease in urine output.  Your urine turns a dark color or changes  to pink, red, or brown. Any of the following symptoms occur over the next 10 days:  You have a temperature by mouth above 102 F (38.9 C), not controlled by medicine.  Shortness of breath.  Weakness after normal activity.  The white part of the eye turns yellow (jaundice).  You have a decrease in the amount of urine or are urinating less often.  Your urine turns a dark color or changes to pink, red, or brown. Document Released: 09/04/2000 Document Revised: 11/30/2011 Document Reviewed: 04/23/2008 [facility] Patient Information 2014 [place], [place].  _______________________________________________________________________

## 2017-09-17 ENCOUNTER — Encounter (HOSPITAL_COMMUNITY): Payer: Self-pay

## 2017-09-17 ENCOUNTER — Other Ambulatory Visit: Payer: Self-pay

## 2017-09-17 ENCOUNTER — Encounter (HOSPITAL_COMMUNITY)
Admission: RE | Admit: 2017-09-17 | Discharge: 2017-09-17 | Disposition: A | Discharge status: Home / Self Care | Payer: Medicare HMO | Source: Ambulatory Visit | Attending: Orthopaedic Surgery | Admitting: Orthopaedic Surgery

## 2017-09-17 DIAGNOSIS — M1612 Unilateral primary osteoarthritis, left hip: Secondary | ICD-10-CM | POA: Insufficient documentation

## 2017-09-17 DIAGNOSIS — Z01812 Encounter for preprocedural laboratory examination: Secondary | ICD-10-CM | POA: Insufficient documentation

## 2017-09-17 LAB — GLUCOSE, CAPILLARY: Glucose-Capillary: 195 mg/dL — ABNORMAL HIGH (ref 65–99)

## 2017-09-17 LAB — HEMOGLOBIN A1C
Hgb A1c MFr Bld: 7.4 % — ABNORMAL HIGH (ref 4.8–5.6)
Mean Plasma Glucose: 165.68 mg/dL

## 2017-09-17 LAB — CBC
HEMATOCRIT: 45.3 % (ref 39.0–52.0)
Hemoglobin: 15.5 g/dL (ref 13.0–17.0)
MCH: 31.7 pg (ref 26.0–34.0)
MCHC: 34.2 g/dL (ref 30.0–36.0)
MCV: 92.6 fL (ref 78.0–100.0)
PLATELETS: 295 10*3/uL (ref 150–400)
RBC: 4.89 MIL/uL (ref 4.22–5.81)
RDW: 12.9 % (ref 11.5–15.5)
WBC: 8.9 10*3/uL (ref 4.0–10.5)

## 2017-09-17 LAB — BASIC METABOLIC PANEL
Anion gap: 7 (ref 5–15)
BUN: 19 mg/dL (ref 6–20)
CHLORIDE: 99 mmol/L — AB (ref 101–111)
CO2: 31 mmol/L (ref 22–32)
CREATININE: 1.17 mg/dL (ref 0.61–1.24)
Calcium: 10.1 mg/dL (ref 8.9–10.3)
GFR calc Af Amer: >60 mL/min (ref >60)
GFR calc non Af Amer: 55 mL/min — ABNORMAL LOW (ref >60)
GLUCOSE: 151 mg/dL — AB (ref 65–99)
POTASSIUM: 4.7 mmol/L (ref 3.5–5.1)
Sodium: 137 mmol/L (ref 135–145)

## 2017-09-17 LAB — SURGICAL PCR SCREEN
MRSA, PCR: NEGATIVE
STAPHYLOCOCCUS AUREUS: NEGATIVE

## 2017-09-18 LAB — ABO/RH: ABO/RH(D): O NEG

## 2017-09-20 NOTE — Progress Notes (Signed)
Spoke to Dr. Michelle Piper, Anesthesiologist regarding pt's history of mild aortic stenosis.(ECHO results in Epic)  Pt asymptomatic with follow-up with cardiology in 1 year. Per Dr. Michelle Piper, as long as pt is asymptomatic, okay to proceed with surgery.

## 2017-09-23 NOTE — Progress Notes (Signed)
09-23-17 Sp[oke to pt's wife pt to advise that surgery time for 09-24-17 procedure has been changed from 11:15 AM to 12:15. Pt to report to admitting at 9:45 AM, and to remain NPO after midnight.

## 2017-09-24 ENCOUNTER — Inpatient Hospital Stay (HOSPITAL_COMMUNITY): Payer: Medicare HMO

## 2017-09-24 ENCOUNTER — Inpatient Hospital Stay (HOSPITAL_COMMUNITY): Payer: Medicare HMO | Admitting: Anesthesiology

## 2017-09-24 ENCOUNTER — Encounter (HOSPITAL_COMMUNITY): Payer: Self-pay | Admitting: Anesthesiology

## 2017-09-24 ENCOUNTER — Inpatient Hospital Stay (HOSPITAL_COMMUNITY)
Admission: RE | Admit: 2017-09-24 | Discharge: 2017-09-27 | DRG: 470 | Disposition: A | Discharge status: Skilled Nursing Facility | Payer: Medicare HMO | Source: Ambulatory Visit | Attending: Orthopaedic Surgery | Admitting: Orthopaedic Surgery

## 2017-09-24 ENCOUNTER — Other Ambulatory Visit: Payer: Self-pay

## 2017-09-24 ENCOUNTER — Encounter (HOSPITAL_COMMUNITY)
Admission: RE | Disposition: A | Discharge status: Skilled Nursing Facility | Payer: Self-pay | Source: Ambulatory Visit | Attending: Orthopaedic Surgery

## 2017-09-24 DIAGNOSIS — Z87891 Personal history of nicotine dependence: Secondary | ICD-10-CM

## 2017-09-24 DIAGNOSIS — Z96642 Presence of left artificial hip joint: Secondary | ICD-10-CM

## 2017-09-24 DIAGNOSIS — Z91048 Other nonmedicinal substance allergy status: Secondary | ICD-10-CM | POA: Diagnosis not present

## 2017-09-24 DIAGNOSIS — Z888 Allergy status to other drugs, medicaments and biological substances status: Secondary | ICD-10-CM

## 2017-09-24 DIAGNOSIS — Z9841 Cataract extraction status, right eye: Secondary | ICD-10-CM | POA: Diagnosis not present

## 2017-09-24 DIAGNOSIS — R011 Cardiac murmur, unspecified: Secondary | ICD-10-CM | POA: Diagnosis not present

## 2017-09-24 DIAGNOSIS — Z794 Long term (current) use of insulin: Secondary | ICD-10-CM | POA: Diagnosis not present

## 2017-09-24 DIAGNOSIS — Z833 Family history of diabetes mellitus: Secondary | ICD-10-CM

## 2017-09-24 DIAGNOSIS — K219 Gastro-esophageal reflux disease without esophagitis: Secondary | ICD-10-CM | POA: Diagnosis present

## 2017-09-24 DIAGNOSIS — Z79899 Other long term (current) drug therapy: Secondary | ICD-10-CM | POA: Diagnosis not present

## 2017-09-24 DIAGNOSIS — R2689 Other abnormalities of gait and mobility: Secondary | ICD-10-CM | POA: Diagnosis not present

## 2017-09-24 DIAGNOSIS — E785 Hyperlipidemia, unspecified: Secondary | ICD-10-CM | POA: Diagnosis not present

## 2017-09-24 DIAGNOSIS — S79911A Unspecified injury of right hip, initial encounter: Secondary | ICD-10-CM | POA: Diagnosis not present

## 2017-09-24 DIAGNOSIS — Z8701 Personal history of pneumonia (recurrent): Secondary | ICD-10-CM

## 2017-09-24 DIAGNOSIS — M6281 Muscle weakness (generalized): Secondary | ICD-10-CM | POA: Diagnosis not present

## 2017-09-24 DIAGNOSIS — G8911 Acute pain due to trauma: Secondary | ICD-10-CM | POA: Diagnosis not present

## 2017-09-24 DIAGNOSIS — E1151 Type 2 diabetes mellitus with diabetic peripheral angiopathy without gangrene: Secondary | ICD-10-CM | POA: Diagnosis present

## 2017-09-24 DIAGNOSIS — I700 Atherosclerosis of aorta: Secondary | ICD-10-CM | POA: Diagnosis not present

## 2017-09-24 DIAGNOSIS — E1140 Type 2 diabetes mellitus with diabetic neuropathy, unspecified: Secondary | ICD-10-CM | POA: Diagnosis present

## 2017-09-24 DIAGNOSIS — M25552 Pain in left hip: Secondary | ICD-10-CM | POA: Diagnosis present

## 2017-09-24 DIAGNOSIS — R278 Other lack of coordination: Secondary | ICD-10-CM | POA: Diagnosis not present

## 2017-09-24 DIAGNOSIS — I10 Essential (primary) hypertension: Secondary | ICD-10-CM | POA: Diagnosis not present

## 2017-09-24 DIAGNOSIS — Z419 Encounter for procedure for purposes other than remedying health state, unspecified: Secondary | ICD-10-CM

## 2017-09-24 DIAGNOSIS — I350 Nonrheumatic aortic (valve) stenosis: Secondary | ICD-10-CM | POA: Diagnosis not present

## 2017-09-24 DIAGNOSIS — M1612 Unilateral primary osteoarthritis, left hip: Principal | ICD-10-CM | POA: Diagnosis present

## 2017-09-24 DIAGNOSIS — Z471 Aftercare following joint replacement surgery: Secondary | ICD-10-CM | POA: Diagnosis not present

## 2017-09-24 DIAGNOSIS — J189 Pneumonia, unspecified organism: Secondary | ICD-10-CM | POA: Diagnosis not present

## 2017-09-24 DIAGNOSIS — E1165 Type 2 diabetes mellitus with hyperglycemia: Secondary | ICD-10-CM | POA: Diagnosis not present

## 2017-09-24 HISTORY — PX: TOTAL HIP ARTHROPLASTY: SHX124

## 2017-09-24 LAB — GLUCOSE, CAPILLARY
GLUCOSE-CAPILLARY: 305 mg/dL — AB (ref 65–99)
Glucose-Capillary: 229 mg/dL — ABNORMAL HIGH (ref 65–99)
Glucose-Capillary: 219 mg/dL — ABNORMAL HIGH (ref 65–99)
Glucose-Capillary: 247 mg/dL — ABNORMAL HIGH (ref 65–99)

## 2017-09-24 LAB — TYPE AND SCREEN
ABO/RH(D): O NEG
Antibody Screen: NEGATIVE

## 2017-09-24 SURGERY — ARTHROPLASTY, HIP, TOTAL, ANTERIOR APPROACH
Anesthesia: Spinal | Site: Hip | Laterality: Left

## 2017-09-24 MED ORDER — POLYETHYLENE GLYCOL 3350 17 G PO PACK: 17.0000 g | PACK | Freq: Every day | ORAL | Status: DC | PRN

## 2017-09-24 MED ORDER — ONDANSETRON HCL 4 MG/2ML IJ SOLN
4.0000 mg | Freq: Four times a day (QID) | INTRAMUSCULAR | Status: DC | PRN
  Administered 2017-09-25: 4 mg via INTRAVENOUS
  Filled 2017-09-24: qty 2

## 2017-09-24 MED ORDER — ACETAMINOPHEN 325 MG PO TABS
650.0000 mg | ORAL_TABLET | ORAL | Status: DC | PRN
  Administered 2017-09-24 – 2017-09-26 (×4): 650 mg via ORAL
  Filled 2017-09-24 (×4): qty 2

## 2017-09-24 MED ORDER — INSULIN GLARGINE 100 UNIT/ML ~~LOC~~ SOLN
15.0000 [IU] | Freq: Every day | SUBCUTANEOUS | Status: DC
  Administered 2017-09-24 – 2017-09-26 (×3): 15 [IU] via SUBCUTANEOUS
  Filled 2017-09-24 (×5): qty 0.15

## 2017-09-24 MED ORDER — SODIUM CHLORIDE 0.9 % IV SOLN
INTRAVENOUS | Status: DC
  Administered 2017-09-24: 75 mL/h via INTRAVENOUS

## 2017-09-24 MED ORDER — PHENYLEPHRINE HCL 10 MG/ML IJ SOLN
INTRAMUSCULAR | Status: AC
  Filled 2017-09-24: qty 1

## 2017-09-24 MED ORDER — ONDANSETRON HCL 4 MG PO TABS: 4.0000 mg | ORAL_TABLET | Freq: Four times a day (QID) | ORAL | Status: DC | PRN

## 2017-09-24 MED ORDER — 0.9 % SODIUM CHLORIDE (POUR BTL) OPTIME
TOPICAL | Status: DC | PRN
  Administered 2017-09-24: 1000 mL

## 2017-09-24 MED ORDER — DOCUSATE SODIUM 100 MG PO CAPS
100.0000 mg | ORAL_CAPSULE | Freq: Two times a day (BID) | ORAL | Status: DC
  Administered 2017-09-24 – 2017-09-27 (×6): 100 mg via ORAL
  Filled 2017-09-24 (×6): qty 1

## 2017-09-24 MED ORDER — STERILE WATER FOR IRRIGATION IR SOLN
Status: DC | PRN
  Administered 2017-09-24: 2000 mL

## 2017-09-24 MED ORDER — ALUM & MAG HYDROXIDE-SIMETH 200-200-20 MG/5ML PO SUSP: 30.0000 mL | ORAL | Status: DC | PRN

## 2017-09-24 MED ORDER — METOCLOPRAMIDE HCL 5 MG PO TABS: 5.0000 mg | ORAL_TABLET | Freq: Three times a day (TID) | ORAL | Status: DC | PRN

## 2017-09-24 MED ORDER — GLYCOPYRROLATE 0.2 MG/ML IV SOSY
PREFILLED_SYRINGE | INTRAVENOUS | Status: DC | PRN
  Administered 2017-09-24: .3 mg via INTRAVENOUS

## 2017-09-24 MED ORDER — SIMVASTATIN 20 MG PO TABS
40.0000 mg | ORAL_TABLET | Freq: Every day | ORAL | Status: DC
  Administered 2017-09-24 – 2017-09-26 (×3): 40 mg via ORAL
  Filled 2017-09-24 (×3): qty 2

## 2017-09-24 MED ORDER — HYDROMORPHONE HCL 1 MG/ML IJ SOLN
0.5000 mg | INTRAMUSCULAR | Status: DC | PRN
  Administered 2017-09-25: 0.5 mg via INTRAVENOUS
  Filled 2017-09-24 (×4): qty 0.5

## 2017-09-24 MED ORDER — ASPIRIN EC 325 MG PO TBEC
325.0000 mg | DELAYED_RELEASE_TABLET | Freq: Every day | ORAL | Status: DC
  Administered 2017-09-25 – 2017-09-27 (×3): 325 mg via ORAL
  Filled 2017-09-24 (×3): qty 1

## 2017-09-24 MED ORDER — GABAPENTIN 100 MG PO CAPS
100.0000 mg | ORAL_CAPSULE | Freq: Three times a day (TID) | ORAL | Status: DC
  Administered 2017-09-24 – 2017-09-27 (×10): 100 mg via ORAL
  Filled 2017-09-24 (×10): qty 1

## 2017-09-24 MED ORDER — PHENOL 1.4 % MT LIQD
1.0000 | OROMUCOSAL | Status: DC | PRN
  Administered 2017-09-26: 1 via OROMUCOSAL
  Filled 2017-09-24: qty 177

## 2017-09-24 MED ORDER — PANTOPRAZOLE SODIUM 40 MG PO TBEC
40.0000 mg | DELAYED_RELEASE_TABLET | Freq: Every day | ORAL | Status: DC
  Administered 2017-09-25 – 2017-09-27 (×3): 40 mg via ORAL
  Filled 2017-09-24 (×3): qty 1

## 2017-09-24 MED ORDER — ACETAMINOPHEN 650 MG RE SUPP: 650.0000 mg | RECTAL | Status: DC | PRN

## 2017-09-24 MED ORDER — FENTANYL CITRATE (PF) 100 MCG/2ML IJ SOLN
INTRAMUSCULAR | Status: AC
Start: 2017-09-24 — End: 2017-09-24
  Filled 2017-09-24: qty 2

## 2017-09-24 MED ORDER — MENTHOL 3 MG MT LOZG: 1.0000 | LOZENGE | OROMUCOSAL | Status: DC | PRN

## 2017-09-24 MED ORDER — LACTATED RINGERS IV SOLN
INTRAVENOUS | Status: DC
  Administered 2017-09-24: 1000 mL via INTRAVENOUS
  Administered 2017-09-24 (×2): via INTRAVENOUS

## 2017-09-24 MED ORDER — ONDANSETRON HCL 4 MG/2ML IJ SOLN
INTRAMUSCULAR | Status: DC | PRN
  Administered 2017-09-24: 4 mg via INTRAVENOUS

## 2017-09-24 MED ORDER — FENTANYL CITRATE (PF) 100 MCG/2ML IJ SOLN
INTRAMUSCULAR | Status: DC | PRN
  Administered 2017-09-24: 25 ug via INTRAVENOUS
  Administered 2017-09-24: 75 ug via INTRAVENOUS

## 2017-09-24 MED ORDER — PROPOFOL 500 MG/50ML IV EMUL
INTRAVENOUS | Status: DC | PRN
  Administered 2017-09-24: 45 ug/kg/min via INTRAVENOUS

## 2017-09-24 MED ORDER — CEFAZOLIN SODIUM-DEXTROSE 1-4 GM/50ML-% IV SOLN
1.0000 g | Freq: Four times a day (QID) | INTRAVENOUS | Status: AC
  Administered 2017-09-24 (×2): 1 g via INTRAVENOUS
  Filled 2017-09-24 (×2): qty 50

## 2017-09-24 MED ORDER — METHOCARBAMOL 500 MG PO TABS
500.0000 mg | ORAL_TABLET | Freq: Four times a day (QID) | ORAL | Status: DC | PRN
  Administered 2017-09-24 – 2017-09-26 (×3): 500 mg via ORAL
  Filled 2017-09-24 (×3): qty 1

## 2017-09-24 MED ORDER — BUPIVACAINE IN DEXTROSE 0.75-8.25 % IT SOLN
INTRATHECAL | Status: DC | PRN
Start: 2017-09-24 — End: 2017-09-24
  Administered 2017-09-24: 1.8 mL via INTRATHECAL

## 2017-09-24 MED ORDER — METHOCARBAMOL 1000 MG/10ML IJ SOLN
500.0000 mg | Freq: Four times a day (QID) | INTRAVENOUS | Status: DC | PRN
  Administered 2017-09-24: 500 mg via INTRAVENOUS
  Filled 2017-09-24: qty 550

## 2017-09-24 MED ORDER — CHLORHEXIDINE GLUCONATE 4 % EX LIQD: 60.0000 mL | Freq: Once | CUTANEOUS | Status: DC

## 2017-09-24 MED ORDER — ONDANSETRON HCL 4 MG/2ML IJ SOLN: 4.0000 mg | Freq: Four times a day (QID) | INTRAMUSCULAR | Status: DC | PRN

## 2017-09-24 MED ORDER — OXYCODONE HCL 5 MG/5ML PO SOLN
5.0000 mg | Freq: Once | ORAL | Status: DC | PRN
  Filled 2017-09-24: qty 5

## 2017-09-24 MED ORDER — FENTANYL CITRATE (PF) 100 MCG/2ML IJ SOLN: 25.0000 ug | INTRAMUSCULAR | Status: DC | PRN

## 2017-09-24 MED ORDER — OXYCODONE HCL 5 MG PO TABS: 5.0000 mg | ORAL_TABLET | Freq: Once | ORAL | Status: DC | PRN

## 2017-09-24 MED ORDER — SODIUM CHLORIDE 0.9 % IR SOLN
Status: DC | PRN
  Administered 2017-09-24: 1000 mL

## 2017-09-24 MED ORDER — CEFAZOLIN SODIUM-DEXTROSE 2-4 GM/100ML-% IV SOLN
2.0000 g | INTRAVENOUS | Status: AC
  Administered 2017-09-24: 2 g via INTRAVENOUS

## 2017-09-24 MED ORDER — PROPOFOL 10 MG/ML IV BOLUS
INTRAVENOUS | Status: AC
  Filled 2017-09-24: qty 40

## 2017-09-24 MED ORDER — DIPHENHYDRAMINE HCL 12.5 MG/5ML PO ELIX
12.5000 mg | ORAL_SOLUTION | ORAL | Status: DC | PRN
Start: 2017-09-24 — End: 2017-09-27

## 2017-09-24 MED ORDER — DEXAMETHASONE SODIUM PHOSPHATE 10 MG/ML IJ SOLN
INTRAMUSCULAR | Status: DC | PRN
  Administered 2017-09-24: 8 mg via INTRAVENOUS

## 2017-09-24 MED ORDER — HYDROCODONE-ACETAMINOPHEN 5-325 MG PO TABS
1.0000 | ORAL_TABLET | ORAL | Status: DC | PRN
  Administered 2017-09-26: 09:00:00 2 via ORAL
  Administered 2017-09-26 – 2017-09-27 (×3): 1 via ORAL
  Filled 2017-09-24: qty 1
  Filled 2017-09-24: qty 2
  Filled 2017-09-24: qty 1
  Filled 2017-09-24: qty 2

## 2017-09-24 MED ORDER — PHENYLEPHRINE 40 MCG/ML (10ML) SYRINGE FOR IV PUSH (FOR BLOOD PRESSURE SUPPORT)
PREFILLED_SYRINGE | INTRAVENOUS | Status: AC
  Filled 2017-09-24: qty 20

## 2017-09-24 MED ORDER — TRANEXAMIC ACID 1000 MG/10ML IV SOLN
1000.0000 mg | INTRAVENOUS | Status: AC
  Administered 2017-09-24: 1000 mg via INTRAVENOUS
  Filled 2017-09-24: qty 1100

## 2017-09-24 MED ORDER — ONDANSETRON HCL 4 MG/2ML IJ SOLN
INTRAMUSCULAR | Status: AC
  Filled 2017-09-24: qty 4

## 2017-09-24 MED ORDER — PHENYLEPHRINE 40 MCG/ML (10ML) SYRINGE FOR IV PUSH (FOR BLOOD PRESSURE SUPPORT)
PREFILLED_SYRINGE | INTRAVENOUS | Status: DC | PRN
  Administered 2017-09-24 (×4): 80 ug via INTRAVENOUS

## 2017-09-24 MED ORDER — CEFAZOLIN SODIUM-DEXTROSE 2-4 GM/100ML-% IV SOLN
INTRAVENOUS | Status: AC
  Filled 2017-09-24: qty 100

## 2017-09-24 MED ORDER — DEXAMETHASONE SODIUM PHOSPHATE 10 MG/ML IJ SOLN
INTRAMUSCULAR | Status: AC
  Filled 2017-09-24: qty 2

## 2017-09-24 MED ORDER — INSULIN ASPART 100 UNIT/ML ~~LOC~~ SOLN
0.0000 [IU] | Freq: Three times a day (TID) | SUBCUTANEOUS | Status: DC
  Administered 2017-09-24: 4 [IU] via SUBCUTANEOUS
  Administered 2017-09-25 (×3): 6 [IU] via SUBCUTANEOUS
  Administered 2017-09-26 – 2017-09-27 (×5): 4 [IU] via SUBCUTANEOUS
  Administered 2017-09-27: 17:00:00 6 [IU] via SUBCUTANEOUS

## 2017-09-24 MED ORDER — METOCLOPRAMIDE HCL 5 MG/ML IJ SOLN
5.0000 mg | Freq: Three times a day (TID) | INTRAMUSCULAR | Status: DC | PRN
  Administered 2017-09-25: 19:00:00 10 mg via INTRAVENOUS
  Filled 2017-09-24: qty 2

## 2017-09-24 MED ORDER — OXYCODONE HCL 5 MG PO TABS
10.0000 mg | ORAL_TABLET | ORAL | Status: DC | PRN
  Administered 2017-09-24 – 2017-09-25 (×6): 10 mg via ORAL
  Filled 2017-09-24 (×7): qty 2

## 2017-09-24 SURGICAL SUPPLY — 40 items
APL SKNCLS STERI-STRIP NONHPOA (GAUZE/BANDAGES/DRESSINGS)
BAG SPEC THK2 15X12 ZIP CLS (MISCELLANEOUS)
BAG ZIPLOCK 12X15 (MISCELLANEOUS) IMPLANT
BENZOIN TINCTURE PRP APPL 2/3 (GAUZE/BANDAGES/DRESSINGS) IMPLANT
BLADE SAW SGTL 18X1.27X75 (BLADE) ×2 IMPLANT
BLADE SAW SGTL 18X1.27X75MM (BLADE) ×1
CAPT HIP TOTAL 2 ×2 IMPLANT
CLOSURE WOUND 1/2 X4 (GAUZE/BANDAGES/DRESSINGS)
COVER PERINEAL POST (MISCELLANEOUS) ×3 IMPLANT
COVER SURGICAL LIGHT HANDLE (MISCELLANEOUS) ×3 IMPLANT
DRAPE STERI IOBAN 125X83 (DRAPES) ×3 IMPLANT
DRAPE U-SHAPE 47X51 STRL (DRAPES) ×6 IMPLANT
DRSG AQUACEL AG ADV 3.5X10 (GAUZE/BANDAGES/DRESSINGS) ×3 IMPLANT
DURAPREP 26ML APPLICATOR (WOUND CARE) ×3 IMPLANT
ELECT REM PT RETURN 15FT ADLT (MISCELLANEOUS) ×3 IMPLANT
GAUZE XEROFORM 1X8 LF (GAUZE/BANDAGES/DRESSINGS) IMPLANT
GLOVE BIO SURGEON STRL SZ7.5 (GLOVE) ×7 IMPLANT
GLOVE BIOGEL PI IND STRL 7.0 (GLOVE) IMPLANT
GLOVE BIOGEL PI IND STRL 8 (GLOVE) ×2 IMPLANT
GLOVE BIOGEL PI INDICATOR 7.0 (GLOVE) ×8
GLOVE BIOGEL PI INDICATOR 8 (GLOVE) ×4
GLOVE ECLIPSE 8.0 STRL XLNG CF (GLOVE) ×3 IMPLANT
GOWN STRL REUS W/TWL XL LVL3 (GOWN DISPOSABLE) ×12 IMPLANT
HANDPIECE INTERPULSE COAX TIP (DISPOSABLE) ×3
HEAD M SROM 36MM 2 (Hips) IMPLANT
HOLDER FOLEY CATH W/STRAP (MISCELLANEOUS) ×3 IMPLANT
PACK ANTERIOR HIP CUSTOM (KITS) ×3 IMPLANT
SET HNDPC FAN SPRY TIP SCT (DISPOSABLE) ×1 IMPLANT
SROM M HEAD 36MM 2 (Hips) ×3 IMPLANT
STAPLER VISISTAT 35W (STAPLE) IMPLANT
STEM CORAIL (Stem) ×2 IMPLANT
STRIP CLOSURE SKIN 1/2X4 (GAUZE/BANDAGES/DRESSINGS) IMPLANT
SUT ETHIBOND NAB CT1 #1 30IN (SUTURE) ×3 IMPLANT
SUT MNCRL AB 4-0 PS2 18 (SUTURE) IMPLANT
SUT VIC AB 0 CT1 36 (SUTURE) ×3 IMPLANT
SUT VIC AB 1 CT1 36 (SUTURE) ×3 IMPLANT
SUT VIC AB 2-0 CT1 27 (SUTURE) ×6
SUT VIC AB 2-0 CT1 TAPERPNT 27 (SUTURE) ×2 IMPLANT
TRAY FOLEY W/METER SILVER 16FR (SET/KITS/TRAYS/PACK) ×3 IMPLANT
YANKAUER SUCT BULB TIP 10FT TU (MISCELLANEOUS) ×3 IMPLANT

## 2017-09-24 NOTE — Anesthesia Preprocedure Evaluation (Signed)
Anesthesia Evaluation  Patient identified by MRN, date of birth, ID band Patient awake    Reviewed: Allergy & Precautions, H&P , NPO status , Patient's Chart, lab work & pertinent test results  Airway Mallampati: II   Neck ROM: full    Dental   Pulmonary former smoker,    breath sounds clear to auscultation       Cardiovascular + Peripheral Vascular Disease  + Valvular Problems/Murmurs  Rhythm:regular Rate:Normal  Mild AS   Neuro/Psych    GI/Hepatic GERD  ,  Endo/Other  diabetes, Type 2  Renal/GU      Musculoskeletal  (+) Arthritis ,   Abdominal   Peds  Hematology   Anesthesia Other Findings   Reproductive/Obstetrics                             Anesthesia Physical Anesthesia Plan  ASA: III  Anesthesia Plan: Spinal   Post-op Pain Management:    Induction: Intravenous  PONV Risk Score and Plan: 1 and Ondansetron, Propofol infusion and Treatment may vary due to age or medical condition  Airway Management Planned: Simple Face Mask  Additional Equipment:   Intra-op Plan:   Post-operative Plan:   Informed Consent: I have reviewed the patients History and Physical, chart, labs and discussed the procedure including the risks, benefits and alternatives for the proposed anesthesia with the patient or authorized representative who has indicated his/her understanding and acceptance.     Plan Discussed with: CRNA, Anesthesiologist and Surgeon  Anesthesia Plan Comments:         Anesthesia Quick Evaluation

## 2017-09-24 NOTE — Anesthesia Procedure Notes (Signed)
Performed by: Countess Biebel, CRNA       

## 2017-09-24 NOTE — Anesthesia Procedure Notes (Addendum)
Spinal  Patient location during procedure: OR Start time: 09/24/2017 11:52 AM End time: 09/24/2017 12:00 PM Staffing Anesthesiologist: Achille Rich, MD Resident/CRNA: Theodosia Quay, CRNA Performed: resident/CRNA  Preanesthetic Checklist Completed: patient identified, surgical consent, pre-op evaluation, timeout performed, IV checked, risks and benefits discussed and monitors and equipment checked Spinal Block Patient position: sitting Prep: DuraPrep Patient monitoring: cardiac monitor, continuous pulse ox and blood pressure Approach: midline Location: L3-4 Injection technique: single-shot Needle Needle type: Pencan  Needle gauge: 24 G Needle length: 9 cm Needle insertion depth: 7 cm Assessment Sensory level: T10 Additional Notes Functioning IV was confirmed and monitors were applied. Sterile prep and drape, including hand hygiene and sterile gloves were used. The patient was positioned and the spine was prepped. The skin was anesthetized with lidocaine.  Free flow of clear CSF was obtained prior to injecting local anesthetic into the CSF.  The spinal needle aspirated freely following injection.  The needle was carefully withdrawn.  The patient tolerated the procedure well.

## 2017-09-24 NOTE — Brief Op Note (Signed)
09/24/2017  1:29 PM  PATIENT:  [name]  84 y.o. male  PRE-OPERATIVE DIAGNOSIS:  osteoarthritis left hip  POST-OPERATIVE DIAGNOSIS:  osteoarthritis left hip  PROCEDURE:  Procedure(s): LEFT TOTAL HIP ARTHROPLASTY ANTERIOR APPROACH (Left)  SURGEON:  Surgeon(s) and Role:    Kathryne Hitch, MD - Primary  PHYSICIAN ASSISTANT: Rexene Edison, PA-C  ANESTHESIA:   spinal  EBL:  250 mL   COUNTS:  YES  DICTATION: .Other Dictation: Dictation Number [id number]  PLAN OF CARE: Admit to inpatient   PATIENT DISPOSITION:  PACU - hemodynamically stable.   Delay start of Pharmacological VTE agent (>24hrs) due to surgical blood loss or risk of bleeding: no

## 2017-09-24 NOTE — Transfer of Care (Signed)
Immediate Anesthesia Transfer of Care Note  Patient: [name]  Procedure(s) Performed: LEFT TOTAL HIP ARTHROPLASTY ANTERIOR APPROACH (Left Hip)  Patient Location: PACU  Anesthesia Type:Spinal  Level of Consciousness: awake, alert  and oriented  Airway & Oxygen Therapy: Patient Spontanous Breathing and Patient connected to face mask oxygen  Post-op Assessment: Report given to RN  Post vital signs: Reviewed and stable  Last Vitals:  Vitals:   09/24/17 0953  BP: (!) 151/60  Pulse: 70  Resp: 18  Temp: 36.7 C  SpO2: 95%    Last Pain:  Vitals:   09/24/17 0953  TempSrc: Oral      Patients Stated Pain Goal: 4 (09/24/17 1018)  Complications: No apparent anesthesia complications

## 2017-09-24 NOTE — H&P (Signed)
TOTAL HIP ADMISSION H&P  Patient is admitted for left total hip arthroplasty.  Subjective:  Chief Complaint: left hip pain  HPI: Jackson [name], 84 y.o. male, has a history of pain and functional disability in the left hip(s) due to arthritis and patient has failed non-surgical conservative treatments for greater than 12 weeks to include NSAID's and/or analgesics, corticosteriod injections, flexibility and strengthening excercises, supervised PT with diminished ADL's post treatment, use of assistive devices and activity modification.  Onset of symptoms was gradual starting 3 years ago with gradually worsening course since that time.The patient noted no past surgery on the left hip(s).  Patient currently rates pain in the left hip at 10 out of 10 with activity. Patient has night pain, worsening of pain with activity and weight bearing, trendelenberg gait, pain that interfers with activities of daily living, pain with passive range of motion and crepitus. Patient has evidence of subchondral cysts, subchondral sclerosis, periarticular osteophytes and joint space narrowing by imaging studies. This condition presents safety issues increasing the risk of falls.  There is no current active infection.  Patient Active Problem List   Diagnosis Date Noted  . Pain in left hip 08/11/2017  . Unilateral primary osteoarthritis, left hip 08/11/2017  . Bradycardia 12/16/2016  . Atherosclerosis of aorta (HCC) 12/16/2016  . Aortic stenosis   . Severe sepsis (HCC) 02/25/2015  . Bilateral pneumonia 02/25/2015  . Diabetes mellitus (HCC) 02/25/2015  . Lactic acidosis 02/25/2015  . Hypoxia 02/25/2015  . Sepsis (HCC) 02/25/2015  . Acute respiratory failure with hypoxia (HCC)   . CAP (community acquired pneumonia)   . SOB (shortness of breath)   . Type 2 diabetes mellitus with hyperglycemia (HCC)    Past Medical History:  Diagnosis Date  . Aortic stenosis    Mild LVH, EF 60-65, normal wall motion, grade 1  diastolic dysfunction, mild aortic stenosis (mean 12/peak 22), trivial MR, normal RVSF, PASP 47 (mild pulmonary hypertension)  . Atherosclerosis of aorta (HCC) 12/16/2016   CXR 6/16: IMPRESSION: 1. Resolved left basilar airspace opacities. 2. Hazy nodularity in both lungs compatible with the patient's known pleural plaques. 3. Right anterior hemidiaphragmatic eventration. 4. Thoracic spondylosis. 5. Atherosclerotic calcification of the aortic arch.  . Bradycardia    Low HR (30s) noted during sleep during admx for sepsis in 2016; Mobitz 1, 2:1 block >> no indication for pacer; management of OSA recommended   . Diabetes mellitus without complication (HCC)   . Diabetic neuropathy (HCC)   . DJD (degenerative joint disease)    knee, hip  . GERD (gastroesophageal reflux disease)   . History of echocardiogram    a. Echo 6/16: mod LVH, EF 65-70, vigorous LVF, no RWMA, mild MR, mild LAE  . HLD (hyperlipidemia)   . Phlebitis of left leg   . Pneumonia 02/2015   admx with sepsis     Past Surgical History:  Procedure Laterality Date  . CATARACT EXTRACTION Right   . CIRCUMCISION      No current facility-administered medications for this encounter.    Current Outpatient Medications  Medication Sig Dispense Refill Last Dose  . Calcium Carbonate-Vitamin D (CALCIUM 600+D PO) Take 1 tablet by mouth daily.     Marland Kitchen gabapentin (NEURONTIN) 100 MG capsule Take 100 mg by mouth 3 (three) times daily.    Taking  . insulin aspart (NOVOLOG FLEXPEN) 100 UNIT/ML FlexPen Before each meal 3 times a day, 140-199 - 2 units, 200-250 - 4 units, 251-299 - 6 units,  300-349 -  8 units,  350 or above 10 units. (Patient taking differently: Inject 2-6 Units into the skin 3 (three) times daily before meals. Before each meal 3 times a day, 140-199 - 2 units, 200-250 - 4 units, 251-299 - 6 units,  300-349 - 8 units,  350 or above 10 units.) 15 mL 11 Taking  . Insulin Glargine (LANTUS SOLOSTAR) 100 UNIT/ML Solostar Pen Inject 10 Units  into the skin daily at 10 pm. (Patient taking differently: Inject 15 Units into the skin daily at 10 pm. ) 15 mL 11 Taking  . lisinopril (PRINIVIL,ZESTRIL) 2.5 MG tablet Take 2.5 mg by mouth daily.    Taking  . Multiple Vitamins-Minerals (MULTIVITAMIN PO) Take 1 tablet by mouth daily. Centrum Silver   Taking  . naproxen sodium (ALEVE) 220 MG tablet Take 220 mg by mouth 2 (two) times daily.     Marland Kitchen omeprazole (PRILOSEC) 20 MG capsule Take 20 mg by mouth daily.    Taking  . simvastatin (ZOCOR) 40 MG tablet Take 40 mg by mouth at bedtime.    Taking  . tetrahydrozoline-zinc (VISINE-AC) 0.05-0.25 % ophthalmic solution Place 2 drops into both eyes 3 (three) times daily as needed (dry eyes).   Taking  . ACCU-CHEK AVIVA PLUS test strip    Taking  . Alcohol Swabs (B-D SINGLE USE SWABS REGULAR) PADS    Taking   Allergies  Allergen Reactions  . Actos [Pioglitazone] Swelling    Social History   Tobacco Use  . Smoking status: Former Smoker    Last attempt to quit: 1960    Years since quitting: 59.0  . Smokeless tobacco: Never Used  Substance Use Topics  . Alcohol use: No    Family History  Problem Relation Age of Onset  . Diabetes Mother   . Diabetes Sister   . Diabetes Brother   . Heart attack Neg Hx   . Stroke Neg Hx   . Hypertension Neg Hx      Review of Systems  Musculoskeletal: Positive for joint pain.  All other systems reviewed and are negative.   Objective:  Physical Exam  Constitutional: He is oriented to person, place, and time. He appears well-developed and well-nourished.  HENT:  Head: Normocephalic and atraumatic.  Eyes: EOM are normal. Pupils are equal, round, and reactive to light.  Neck: Normal range of motion. Neck supple.  Cardiovascular: Normal rate.  Respiratory: Effort normal and breath sounds normal.  GI: Soft. Bowel sounds are normal.  Musculoskeletal:       Left hip: He exhibits decreased range of motion, decreased strength, tenderness and bony tenderness.   Neurological: He is alert and oriented to person, place, and time.  Skin: Skin is warm and dry.  Psychiatric: He has a normal mood and affect.    Vital signs in last 24 hours:    Labs:   Estimated body mass index is 27.78 kg/m as calculated from the following:   Height as of 09/17/17: 5' 7.5" (1.715 m).   Weight as of 09/17/17: 180 lb (81.6 kg).   Imaging Review Plain radiographs demonstrate severe degenerative joint disease of the left hip(s). The bone quality appears to be good for age and reported activity level.  Assessment/Plan:  End stage arthritis, left hip(s)  The patient history, physical examination, clinical judgement of the provider and imaging studies are consistent with end stage degenerative joint disease of the left hip(s) and total hip arthroplasty is deemed medically necessary. The treatment options including medical management, injection  therapy, arthroscopy and arthroplasty were discussed at length. The risks and benefits of total hip arthroplasty were presented and reviewed. The risks due to aseptic loosening, infection, stiffness, dislocation/subluxation,  thromboembolic complications and other imponderables were discussed.  The patient acknowledged the explanation, agreed to proceed with the plan and consent was signed. Patient is being admitted for inpatient treatment for surgery, pain control, PT, OT, prophylactic antibiotics, VTE prophylaxis, progressive ambulation and ADL's and discharge planning.The patient is planning to be discharged to skilled nursing facility

## 2017-09-25 LAB — GLUCOSE, CAPILLARY
GLUCOSE-CAPILLARY: 292 mg/dL — AB (ref 65–99)
GLUCOSE-CAPILLARY: 249 mg/dL — AB (ref 65–99)
GLUCOSE-CAPILLARY: 219 mg/dL — AB (ref 65–99)
Glucose-Capillary: 258 mg/dL — ABNORMAL HIGH (ref 65–99)
Glucose-Capillary: 260 mg/dL — ABNORMAL HIGH (ref 65–99)

## 2017-09-25 LAB — BASIC METABOLIC PANEL
Anion gap: 10 (ref 5–15)
BUN: 19 mg/dL (ref 6–20)
CHLORIDE: 96 mmol/L — AB (ref 101–111)
CO2: 25 mmol/L (ref 22–32)
Calcium: 9.4 mg/dL (ref 8.9–10.3)
Creatinine, Ser: 1.19 mg/dL (ref 0.61–1.24)
GFR calc Af Amer: >60 mL/min (ref >60)
GFR calc non Af Amer: 53 mL/min — ABNORMAL LOW (ref >60)
GLUCOSE: 325 mg/dL — AB (ref 65–99)
Potassium: 4.7 mmol/L (ref 3.5–5.1)
Sodium: 131 mmol/L — ABNORMAL LOW (ref 135–145)

## 2017-09-25 LAB — CBC
HCT: 38.8 % — ABNORMAL LOW (ref 39.0–52.0)
HEMOGLOBIN: 13.3 g/dL (ref 13.0–17.0)
MCH: 30.9 pg (ref 26.0–34.0)
MCHC: 34.3 g/dL (ref 30.0–36.0)
MCV: 90.2 fL (ref 78.0–100.0)
Platelets: 247 10*3/uL (ref 150–400)
RBC: 4.3 MIL/uL (ref 4.22–5.81)
RDW: 12.7 % (ref 11.5–15.5)
WBC: 11.4 10*3/uL — ABNORMAL HIGH (ref 4.0–10.5)

## 2017-09-25 NOTE — Evaluation (Addendum)
Occupational Therapy Evaluation Patient Details Name: Jackson [name] MRN: [id number] DOB: [birth year] Today's Date: 09/25/2017    History of Present Illness LEFT TOTAL HIP ARTHROPLASTY ANTERIOR APPROACH    Clinical Impression   Pt is s/p THA resulting in the deficits listed below (see OT Problem List).  Pt will benefit from skilled OT to increase their safety and independence with ADL and functional mobility for ADL to facilitate discharge to venue listed below.        Follow Up Recommendations  SNF   Equipment Recommendations  None recommended by OT       Precautions / Restrictions Precautions Precautions: None      Mobility Bed Mobility Overal bed mobility: Needs Assistance Bed Mobility: Supine to Sit     Supine to sit: Min assist        Transfers Overall transfer level: Needs assistance Equipment used: Rolling walker (2 wheeled) Transfers: Sit to/from [employer] Sit to Stand: Min assist Stand pivot transfers: Min assist       General transfer comment: VC for hand placement        ADL either performed or assessed with clinical judgement   ADL Overall ADL's : Needs assistance/impaired Eating/Feeding: Set up;Sitting   Grooming: Set up;Sitting   Upper Body Bathing: Set up;Sitting   Lower Body Bathing: Minimal assistance;Sit to/from stand   Upper Body Dressing : Set up;Sitting   Lower Body Dressing: Minimal assistance;Sit to/from stand   Toilet Transfer: Minimal assistance;RW;Comfort height toilet Toilet Transfer Details (indicate cue type and reason): bed to chair                 Vision Patient Visual Report: No change from baseline              Pertinent Vitals/Pain Pain Assessment: 0-10 Pain Score: 3  Pain Location: l hip Pain Descriptors / Indicators: Discomfort;Sore Pain Intervention(s): Limited activity within patient's tolerance     Hand Dominance     Extremity/Trunk Assessment Upper Extremity  Assessment Upper Extremity Assessment: Generalized weakness           Communication     Cognition Arousal/Alertness: Awake/alert Behavior During Therapy: WFL for tasks assessed/performed Overall Cognitive Status: Within Functional Limits for tasks assessed                                                Home Living Family/patient expects to be discharged to:: Private residence Living Arrangements: Spouse/significant other Available Help at Discharge: Family Type of Home: House       Home Layout: Two level;Able to live on main level with bedroom/bathroom     Bathroom Shower/Tub: Chief Strategy Officer: Standard                         OT Problem List: Decreased strength;Decreased safety awareness      OT Treatment/Interventions: Self-care/ADL training;Patient/family education;DME and/or AE instruction    OT Goals(Current goals can be found in the care plan section) Acute Rehab OT Goals Patient Stated Goal: home with wife and work on old cars OT Goal Formulation: With patient Time For Goal Achievement: 10/09/17 Potential to Achieve Goals: Good  OT Frequency: Min 2X/week   Barriers to D/C:               AM-PAC PT "6  Clicks" Daily Activity     Outcome Measure Help from another person eating meals?: None Help from another person taking care of personal grooming?: None Help from another person toileting, which includes using toliet, bedpan, or urinal?: A Little Help from another person bathing (including washing, rinsing, drying)?: A Little Help from another person to put on and taking off regular upper body clothing?: None Help from another person to put on and taking off regular lower body clothing?: A Little 6 Click Score: 21   End of Session Equipment Utilized During Treatment: Rolling walker Nurse Communication: Mobility status  Activity Tolerance: Patient tolerated treatment well Patient left: in chair;with call  bell/phone within reach;with family/visitor present  OT Visit Diagnosis: Unsteadiness on feet (R26.81);Muscle weakness (generalized) (M62.81)                Time: 4098-1191 OT Time Calculation (min): 11 min Charges:  OT General Charges $OT Visit: 1 Visit OT Evaluation $OT Eval Moderate Complexity: 1 Mod G-Codes:     Lise Auer, [place] [phone]  Einar Crow D 09/25/2017, 12:27 PM

## 2017-09-25 NOTE — Progress Notes (Signed)
Physical Therapy Treatment Patient Details Name: Jackson [name] MRN: [id number] DOB: [birth year] Today's Date: 09/25/2017    History of Present Illness LEFT TOTAL HIP ARTHROPLASTY ANTERIOR APPROACH     PT Comments    Pt continues very cooperative and eager to progress.  This pm, pt stood, experienced urinary urgency and and assisted to use urinal.  Pt with min success but after ~ 10 min attempting, pt becoming nauseous and assisted back to bed.  RN aware.   Follow Up Recommendations  DC plan and follow up therapy as arranged by surgeon;SNF     Equipment Recommendations  None recommended by PT    Recommendations for Other Services OT consult     Precautions / Restrictions Precautions Precautions: None;Fall Restrictions Weight Bearing Restrictions: No Other Position/Activity Restrictions: WBAT    Mobility  Bed Mobility Overal bed mobility: Needs Assistance Bed Mobility: Sit to Supine       Sit to supine: Min assist   General bed mobility comments: cues for sequence and use of R LE to self assist  Transfers Overall transfer level: Needs assistance Equipment used: Rolling walker (2 wheeled) Transfers: Sit to/from Stand Sit to Stand: Min assist Stand pivot transfers: Min assist       General transfer comment: VC for hand placement  Ambulation/Gait Ambulation/Gait assistance: Min assist Ambulation Distance (Feet): 5 Feet Assistive device: Rolling walker (2 wheeled) Gait Pattern/deviations: Step-to pattern;Decreased step length - right;Shuffle;Narrow base of support Gait velocity: decr Gait velocity interpretation: Below normal speed for age/gender General Gait Details: cues for sequence, posture, position from [employer]            Wheelchair Mobility    Modified Rankin (Stroke Patients Only)       Balance Overall balance assessment: Needs assistance Sitting-balance support: Feet supported;No upper extremity supported Sitting balance-Leahy  Scale: Good     Standing balance support: Bilateral upper extremity supported Standing balance-Leahy Scale: Poor                              Cognition Arousal/Alertness: Awake/alert Behavior During Therapy: WFL for tasks assessed/performed Overall Cognitive Status: Within Functional Limits for tasks assessed                                        Exercises      General Comments        Pertinent Vitals/Pain Pain Assessment: 0-10 Pain Score: 4  Pain Location: l hip Pain Descriptors / Indicators: Discomfort;Sore Pain Intervention(s): Limited activity within patient's tolerance;Monitored during session;Ice applied    Home Living                      Prior Function            PT Goals (current goals can now be found in the care plan section) Acute Rehab PT Goals Patient Stated Goal: Back on the golf course this spring PT Goal Formulation: With patient Time For Goal Achievement: 10/02/17 Potential to Achieve Goals: Good Progress towards PT goals: Progressing toward goals    Frequency    7X/week      PT Plan Current plan remains appropriate    Co-evaluation              AM-PAC PT "6 Clicks" Daily Activity  Outcome Measure  Difficulty turning  over in bed (including adjusting bedclothes, sheets and blankets)?: Unable Difficulty moving from lying on back to sitting on the side of the bed? : Unable Difficulty sitting down on and standing up from a chair with arms (e.g., wheelchair, bedside commode, etc,.)?: Unable Help needed moving to and from a bed to chair (including a wheelchair)?: A Little Help needed walking in hospital room?: A Little Help needed climbing 3-5 steps with a railing? : A Lot 6 Click Score: 11    End of Session Equipment Utilized During Treatment: Gait belt Activity Tolerance: Patient limited by fatigue Patient left: in bed;with call bell/phone within reach;with family/visitor present Nurse  Communication: Mobility status PT Visit Diagnosis: Unsteadiness on feet (R26.81);Difficulty in walking, not elsewhere classified (R26.2)     Time: 1610-9604 PT Time Calculation (min) (ACUTE ONLY): 23 min  Charges:  $Gait Training: 8-22 mins $Therapeutic Activity: 8-22 mins                    G Codes:       Pg [phone]    Kearston Putman 09/25/2017, 4:51 PM

## 2017-09-25 NOTE — Progress Notes (Signed)
Patient ID: Jackson [name], male   DOB: 10/29/1928, 84 y.o.   MRN: [id number] Patient is postoperative day 1 status post left total hip arthroplasty.  Without complaints this morning.  Physical therapy progressive ambulation discharge when safe with therapy.

## 2017-09-25 NOTE — Evaluation (Signed)
Physical Therapy Evaluation Patient Details Name: Jackson [name] MRN: [id number] DOB: 01-11-1929 Today's Date: 09/25/2017   History of Present Illness  LEFT TOTAL HIP ARTHROPLASTY ANTERIOR APPROACH   Clinical Impression  Pt s/p L THR and presents with decreased L LE strength/ROM and post op pain limiting functional mobility.  Pt would benefit from follow up rehab at SNF level to maximize IND and safety prior to return home with ltd assist.    Follow Up Recommendations DC plan and follow up therapy as arranged by surgeon;SNF    Equipment Recommendations  None recommended by PT    Recommendations for Other Services OT consult     Precautions / Restrictions Precautions Precautions: None;Fall Restrictions Weight Bearing Restrictions: No Other Position/Activity Restrictions: WBAT      Mobility  Bed Mobility Overal bed mobility: Needs Assistance Bed Mobility: Supine to Sit     Supine to sit: Min assist     General bed mobility comments: Pt OOB with OT  Transfers Overall transfer level: Needs assistance Equipment used: Rolling walker (2 wheeled) Transfers: Sit to/from Stand Sit to Stand: Min assist Stand pivot transfers: Min assist       General transfer comment: VC for hand placement  Ambulation/Gait Ambulation/Gait assistance: Min assist Ambulation Distance (Feet): 38 Feet Assistive device: Rolling walker (2 wheeled) Gait Pattern/deviations: Step-to pattern;Decreased step length - right;Shuffle;Narrow base of support Gait velocity: decr Gait velocity interpretation: Below normal speed for age/gender General Gait Details: cues for sequence, posture, position from RW, stride length, and to increase BOS  Stairs            Wheelchair Mobility    Modified Rankin (Stroke Patients Only)       Balance Overall balance assessment: Needs assistance Sitting-balance support: Feet supported;No upper extremity supported Sitting balance-Leahy Scale: Good      Standing balance support: Bilateral upper extremity supported Standing balance-Leahy Scale: Poor                               Pertinent Vitals/Pain Pain Assessment: 0-10 Pain Score: 4  Pain Location: l hip Pain Descriptors / Indicators: Discomfort;Sore Pain Intervention(s): Limited activity within patient's tolerance;Monitored during session;Premedicated before session;Ice applied    Home Living Family/patient expects to be discharged to:: Skilled nursing facility Living Arrangements: Spouse/significant other Available Help at Discharge: Family Type of Home: House       Home Layout: Two level;Able to live on main level with bedroom/bathroom   Additional Comments: Pt and spouse state they hope to re[facility] at [facility]    Prior Function Level of Independence: Independent;Independent with assistive device(s)               Hand Dominance        Extremity/Trunk Assessment   Upper Extremity Assessment Upper Extremity Assessment: Overall WFL for tasks assessed    Lower Extremity Assessment Lower Extremity Assessment: LLE deficits/detail LLE Deficits / Details: Strength at hip 2+/5 with AAROM at hip to 85 flex and 15 abd    Cervical / Trunk Assessment Cervical / Trunk Assessment: Kyphotic  Communication   Communication: No difficulties  Cognition Arousal/Alertness: Awake/alert Behavior During Therapy: WFL for tasks assessed/performed Overall Cognitive Status: Within Functional Limits for tasks assessed  General Comments      Exercises Total Joint Exercises Ankle Circles/Pumps: AROM;Both;20 reps;Supine Quad Sets: AROM;Both;10 reps;Supine Heel Slides: AAROM;Left;Supine;20 reps Hip ABduction/ADduction: AAROM;Left;15 reps;Supine   Assessment/Plan    PT Assessment Patient needs continued PT services  PT Problem List Decreased strength;Decreased range of motion;Decreased activity  tolerance;Decreased balance;Decreased mobility;Decreased knowledge of use of DME;Pain       PT Treatment Interventions DME instruction;Gait training;Stair training;Functional mobility training;Therapeutic exercise;Therapeutic activities;Patient/family education    PT Goals (Current goals can be found in the Care Plan section)  Acute Rehab PT Goals Patient Stated Goal: [place] the golf [place] this spring PT Goal Formulation: With patient Time For Goal Achievement: 10/02/17 Potential to Achieve Goals: Good    Frequency 7X/week   Barriers to discharge        Co-evaluation               AM-PAC PT "6 Clicks" Daily Activity  Outcome Measure Difficulty turning over in bed (including adjusting bedclothes, sheets and blankets)?: Unable Difficulty moving from lying on back to sitting on the side of the bed? : Unable Difficulty sitting down on and standing up from a chair with arms (e.g., wheelchair, bedside commode, etc,.)?: Unable Help needed moving to and from a bed to chair (including a wheelchair)?: A Little Help needed walking in hospital room?: A Little Help needed climbing 3-5 steps with a railing? : A Lot 6 Click Score: 11    End of Session Equipment Utilized During Treatment: Gait belt Activity Tolerance: Patient limited by fatigue;Other (comment)(nausea) Patient left: in chair;with call bell/phone within reach;with family/visitor present Nurse Communication: Mobility status PT Visit Diagnosis: Unsteadiness on feet (R26.81);Difficulty in walking, not elsewhere classified (R26.2)    Time: 0233-4356 PT Time Calculation (min) (ACUTE ONLY): 38 min   Charges:   PT Evaluation $PT Eval Low Complexity: 1 Low PT Treatments $Gait Training: 8-22 mins $Therapeutic Exercise: 8-22 mins   PT G Codes:        Pg [phone]   Janal Haak 09/25/2017, 12:44 PM

## 2017-09-25 NOTE — Progress Notes (Signed)
Pt up walking in hall with PT & became light-headed & nauseated. Chair obtained & pt sat down. Very small amt brown emesis. Pt feeling better. VSS. Dr Lajoyce Corners on unit & talking with pt. Yenifer Saccente, [employer]

## 2017-09-26 LAB — GLUCOSE, CAPILLARY
GLUCOSE-CAPILLARY: 235 mg/dL — AB (ref 65–99)
GLUCOSE-CAPILLARY: 237 mg/dL — AB (ref 65–99)
GLUCOSE-CAPILLARY: 290 mg/dL — AB (ref 65–99)
Glucose-Capillary: 247 mg/dL — ABNORMAL HIGH (ref 65–99)

## 2017-09-26 NOTE — [place] FL2 (Signed)
[place] MEDICAID FL2 LEVEL OF CARE SCREENING TOOL     IDENTIFICATION  Patient Name: Jackson [name] Birthdate: 04/19/1929 Sex: male Admission Date (Current Location): 09/24/2017  [facility] and [place] Number:  Producer, television/film/video and Address:  [facility],  [address] [place], [place] [address]      Provider Number: [id number]  Attending Physician Name and Address:  Kathryne Hitch  Relative Name and Phone Number:  Erika Hussar, [phone], spouse    Current Level of Care: Hospital Recommended Level of Care: Skilled Nursing Facility Prior Approval Number:    Date Approved/Denied:   PASRR Number: [id number]  Discharge Plan: SNF    Current Diagnoses: Patient Active Problem List   Diagnosis Date Noted  . Status post total replacement of left hip 09/24/2017  . Pain in left hip 08/11/2017  . Unilateral primary osteoarthritis, left hip 08/11/2017  . Bradycardia 12/16/2016  . Atherosclerosis of aorta (HCC) 12/16/2016  . Aortic stenosis   . Severe sepsis (HCC) 02/25/2015  . Bilateral pneumonia 02/25/2015  . Diabetes mellitus (HCC) 02/25/2015  . Lactic acidosis 02/25/2015  . Hypoxia 02/25/2015  . Sepsis (HCC) 02/25/2015  . Acute respiratory failure with hypoxia (HCC)   . CAP (community acquired pneumonia)   . SOB (shortness of breath)   . Type 2 diabetes mellitus with hyperglycemia (HCC)     Orientation RESPIRATION BLADDER Height & Weight     Self, Time, Situation  Normal Incontinent Weight: 180 lb (81.6 kg) Height:  5' 7.5" (171.5 cm)  BEHAVIORAL SYMPTOMS/MOOD NEUROLOGICAL BOWEL NUTRITION STATUS      Continent Diet(See DC Summary)  AMBULATORY STATUS COMMUNICATION OF NEEDS Skin   Limited Assist Verbally Surgical wounds                       Personal Care Assistance Level of Assistance  Bathing, Dressing, Feeding Bathing Assistance: Limited assistance Feeding assistance: Independent Dressing Assistance: Limited assistance      Functional Limitations Info  Sight, Hearing, Speech Sight Info: Adequate Hearing Info: Adequate Speech Info: Adequate    SPECIAL CARE FACTORS FREQUENCY  PT (By licensed PT), OT (By licensed OT)     PT Frequency: 5x week OT Frequency: 5x week            Contractures Contractures Info: Not present    Additional Factors Info  Code Status, Allergies, Insulin Sliding Scale Code Status Info: Full Allergies Info: ACTOS PIOGLITAZONE, FORMALDEHYDE   Insulin Sliding Scale Info: 3x's a day and at bedtime       Current Medications (09/26/2017):  This is the current hospital active medication list Current Facility-Administered Medications  Medication Dose Route Frequency Provider Last Rate Last Dose  . 0.9 %  sodium chloride infusion   Intravenous Continuous Kathryne Hitch, MD 75 mL/hr at 09/24/17 1609 75 mL/hr at 09/24/17 1609  . acetaminophen (TYLENOL) tablet 650 mg  650 mg Oral Q4H PRN Kathryne Hitch, MD   650 mg at 09/26/17 0426   Or  . acetaminophen (TYLENOL) suppository 650 mg  650 mg Rectal Q4H PRN Kathryne Hitch, MD      . alum & mag hydroxide-simeth (MAALOX/MYLANTA) 200-200-20 MG/5ML suspension 30 mL  30 mL Oral Q4H PRN Kathryne Hitch, MD      . aspirin EC tablet 325 mg  325 mg Oral Q breakfast Kathryne Hitch, MD   325 mg at 09/26/17 0802  . diphenhydrAMINE (BENADRYL) 12.5 MG/5ML elixir 12.5-25 mg  12.5-25  mg Oral Q4H PRN Kathryne Hitch, MD      . docusate sodium (COLACE) capsule 100 mg  100 mg Oral BID Kathryne Hitch, MD   100 mg at 09/25/17 2201  . gabapentin (NEURONTIN) capsule 100 mg  100 mg Oral TID Kathryne Hitch, MD   100 mg at 09/25/17 2201  . HYDROcodone-acetaminophen (NORCO/VICODIN) 5-325 MG per tablet 1-2 tablet  1-2 tablet Oral Q4H PRN Kathryne Hitch, MD   2 tablet at 09/26/17 0830  . HYDROmorphone (DILAUDID) injection 0.5 mg  0.5 mg Intravenous Q2H PRN Kathryne Hitch, MD   0.5  mg at 09/25/17 0219  . insulin aspart (novoLOG) injection 0-10 Units  0-10 Units Subcutaneous TID AC Kathryne Hitch, MD   4 Units at 09/26/17 [phone]  . insulin glargine (LANTUS) injection 15 Units  15 Units Subcutaneous Q2200 Kathryne Hitch, MD   15 Units at 09/25/17 2202  . menthol-cetylpyridinium (CEPACOL) lozenge 3 mg  1 lozenge Oral PRN Kathryne Hitch, MD       Or  . phenol (CHLORASEPTIC) mouth spray 1 spray  1 spray Mouth/Throat PRN Kathryne Hitch, MD      . methocarbamol (ROBAXIN) tablet 500 mg  500 mg Oral Q6H PRN Kathryne Hitch, MD   500 mg at 09/26/17 3202   Or  . methocarbamol (ROBAXIN) 500 mg in dextrose 5 % 50 mL IVPB  500 mg Intravenous Q6H PRN Kathryne Hitch, MD   Stopped at 09/24/17 1511  . metoCLOPramide (REGLAN) tablet 5-10 mg  5-10 mg Oral Q8H PRN Kathryne Hitch, MD       Or  . metoCLOPramide (REGLAN) injection 5-10 mg  5-10 mg Intravenous Q8H PRN Kathryne Hitch, MD   10 mg at 09/25/17 1843  . ondansetron (ZOFRAN) tablet 4 mg  4 mg Oral Q6H PRN Kathryne Hitch, MD       Or  . ondansetron [facility]) injection 4 mg  4 mg Intravenous Q6H PRN Kathryne Hitch, MD   4 mg at 09/25/17 1417  . oxyCODONE (Oxy IR/ROXICODONE) immediate release tablet 10 mg  10 mg Oral Q3H PRN Kathryne Hitch, MD   10 mg at 09/25/17 1841  . pantoprazole (PROTONIX) EC tablet 40 mg  40 mg Oral Daily Kathryne Hitch, MD   40 mg at 09/25/17 1017  . polyethylene glycol (MIRALAX / GLYCOLAX) packet 17 g  17 g Oral Daily PRN Kathryne Hitch, MD      . simvastatin (ZOCOR) tablet 40 mg  40 mg Oral QHS Kathryne Hitch, MD   40 mg at 09/25/17 2201     Discharge Medications: Please see discharge summary for a list of discharge medications.  Relevant Imaging Results:  Relevant Lab Results:   Additional Information SS#: [id number] [place], LCSW

## 2017-09-26 NOTE — Progress Notes (Signed)
Physical Therapy Treatment Patient Details Name: Jackson [name] MRN: [id number] DOB: 1929/01/01 Today's Date: 09/26/2017    History of Present Illness LEFT TOTAL HIP ARTHROPLASTY ANTERIOR APPROACH     PT Comments    Pt very cooperative and with improvement noted in following cues and basic safety awareness.   Follow Up Recommendations  DC plan and follow up therapy as arranged by surgeon;SNF     Equipment Recommendations  None recommended by PT    Recommendations for Other Services OT consult     Precautions / Restrictions Precautions Precautions: None;Fall Restrictions Weight Bearing Restrictions: No Other Position/Activity Restrictions: WBAT    Mobility  Bed Mobility               General bed mobility comments: Pt OOB and requests back to chair  Transfers Overall transfer level: Needs assistance Equipment used: Rolling walker (2 wheeled) Transfers: Sit to/from Stand Sit to Stand: Min assist Stand pivot transfers: Min assist       General transfer comment: VC for hand placement and basic safety awareness  Ambulation/Gait Ambulation/Gait assistance: Min assist Ambulation Distance (Feet): 80 Feet Assistive device: Rolling walker (2 wheeled) Gait Pattern/deviations: Step-to pattern;Decreased step length - right;Shuffle;Narrow base of support Gait velocity: decr Gait velocity interpretation: Below normal speed for age/gender General Gait Details: cues for sequence, posture, position from RW, safety awareness, and to increase BOS   Stairs            Wheelchair Mobility    Modified Rankin (Stroke Patients Only)       Balance Overall balance assessment: Needs assistance Sitting-balance support: Feet supported;No upper extremity supported Sitting balance-Leahy Scale: Good     Standing balance support: Bilateral upper extremity supported Standing balance-Leahy Scale: Poor                              Cognition  Arousal/Alertness: Awake/alert Behavior During Therapy: WFL for tasks assessed/performed Overall Cognitive Status: Within Functional Limits for tasks assessed                                        Exercises      General Comments        Pertinent Vitals/Pain Pain Assessment: 0-10 Pain Score: 5  Pain Location: l hip Pain Descriptors / Indicators: Discomfort;Sore Pain Intervention(s): Limited activity within patient's tolerance;Monitored during session;Ice applied    Home Living                      Prior Function            PT Goals (current goals can now be found in the care plan section) Acute Rehab PT Goals Patient Stated Goal: [place] golf [place] this [place] PT Goal Formulation: With patient Time For Goal Achievement: 10/02/17 Potential to Achieve Goals: Good Progress towards PT goals: Progressing toward goals    Frequency    7X/week      PT Plan Current plan remains appropriate    Co-evaluation              AM-PAC PT "6 Clicks" Daily Activity  Outcome Measure  Difficulty turning over in bed (including adjusting bedclothes, sheets and blankets)?: Unable Difficulty moving from lying on back to sitting on the side of the bed? : Unable Difficulty sitting down on and standing up  from a chair with arms (e.g., wheelchair, bedside commode, etc,.)?: Unable Help needed moving to and from a bed to chair (including a wheelchair)?: A Little Help needed walking in hospital room?: A Little Help needed climbing 3-5 steps with a railing? : A Lot 6 Click Score: 11    End of Session Equipment Utilized During Treatment: Gait belt Activity Tolerance: Patient tolerated treatment well Patient left: with call bell/phone within reach;with family/visitor present;in chair Nurse Communication: Mobility status PT Visit Diagnosis: Unsteadiness on feet (R26.81);Difficulty in walking, not elsewhere classified (R26.2)     Time: 1610-9604 PT  Time Calculation (min) (ACUTE ONLY): 20 min  Charges:  $Gait Training: 8-22 mins                    G Codes:       Pg [phone]    Takeisha Cianci 09/26/2017, 3:15 PM

## 2017-09-26 NOTE — Progress Notes (Signed)
Physical Therapy Treatment Patient Details Name: Jackson [name] MRN: [id number] DOB: [birth year] Today's Date: 09/26/2017    History of Present Illness LEFT TOTAL HIP ARTHROPLASTY ANTERIOR APPROACH     PT Comments    Pt continues cooperative but with slowed processing this am and increased cues for safety awareness and follow through.   Follow Up Recommendations  DC plan and follow up therapy as arranged by surgeon;SNF     Equipment Recommendations  None recommended by PT    Recommendations for Other Services OT consult     Precautions / Restrictions Precautions Precautions: None;Fall Restrictions Weight Bearing Restrictions: No Other Position/Activity Restrictions: WBAT    Mobility  Bed Mobility               General bed mobility comments: OOB to [facility] with nursing  Transfers Overall transfer level: Needs assistance Equipment used: Rolling walker (2 wheeled) Transfers: Sit to/from Stand Sit to Stand: Min assist Stand pivot transfers: Min assist       General transfer comment: VC for hand placement and basic safety awareness  Ambulation/Gait Ambulation/Gait assistance: Min assist Ambulation Distance (Feet): 68 Feet Assistive device: Rolling walker (2 wheeled) Gait Pattern/deviations: Step-to pattern;Decreased step length - right;Shuffle;Narrow base of support Gait velocity: decr Gait velocity interpretation: Below normal speed for age/gender General Gait Details: cues for sequence, posture, position from RW, safety awareness, and to increase BOS   Stairs            Wheelchair Mobility    Modified Rankin (Stroke Patients Only)       Balance Overall balance assessment: Needs assistance Sitting-balance support: Feet supported;No upper extremity supported Sitting balance-Leahy Scale: Good     Standing balance support: Bilateral upper extremity supported Standing balance-Leahy Scale: Poor                              Cognition  Arousal/Alertness: Awake/alert Behavior During Therapy: WFL for tasks assessed/performed Overall Cognitive Status: Within Functional Limits for tasks assessed                                        Exercises      General Comments        Pertinent Vitals/Pain Pain Assessment: 0-10 Pain Score: 4  Pain Location: l hip Pain Descriptors / Indicators: Discomfort;Sore Pain Intervention(s): Limited activity within patient's tolerance;Monitored during session;Premedicated before session    Home Living                      Prior Function            PT Goals (current goals can now be found in the care plan section) Acute Rehab PT Goals Patient Stated Goal: Back on the golf course this spring PT Goal Formulation: With patient Time For Goal Achievement: 10/02/17 Potential to Achieve Goals: Good Progress towards PT goals: Progressing toward goals    Frequency    7X/week      PT Plan Current plan remains appropriate    Co-evaluation              AM-PAC PT "6 Clicks" Daily Activity  Outcome Measure  Difficulty turning over in bed (including adjusting bedclothes, sheets and blankets)?: Unable Difficulty moving from lying on back to sitting on the side of the bed? : Unable Difficulty sitting down on  and standing up from a chair with arms (e.g., wheelchair, bedside commode, etc,.)?: Unable Help needed moving to and from a bed to chair (including a wheelchair)?: A Little Help needed walking in hospital room?: A Little Help needed climbing 3-5 steps with a railing? : A Lot 6 Click Score: 11    End of Session Equipment Utilized During Treatment: Gait belt Activity Tolerance: Patient limited by fatigue Patient left: with call bell/phone within reach;with family/visitor present;in chair Nurse Communication: Mobility status PT Visit Diagnosis: Unsteadiness on feet (R26.81);Difficulty in walking, not elsewhere classified (R26.2)     Time:  5320-2334 PT Time Calculation (min) (ACUTE ONLY): 18 min  Charges:  $Gait Training: 8-22 mins                    G Codes:       Pg [phone]    Zyon Grout 09/26/2017, 1:06 PM

## 2017-09-26 NOTE — Anesthesia Postprocedure Evaluation (Signed)
Anesthesia Post Note  Patient: Jackson [name]  Procedure(s) Performed: LEFT TOTAL HIP ARTHROPLASTY ANTERIOR APPROACH (Left Hip)     Patient location during evaluation: PACU Anesthesia Type: Spinal Level of consciousness: oriented and awake and alert Pain management: pain level controlled Vital Signs Assessment: post-procedure vital signs reviewed and stable Respiratory status: spontaneous breathing, respiratory function stable and patient connected to nasal cannula oxygen Cardiovascular status: blood pressure returned to baseline and stable Postop Assessment: no headache, no backache and no apparent nausea or vomiting Anesthetic complications: no    Last Vitals:  Vitals:   09/25/17 2119 09/26/17 0438  BP: (!) 145/49 (!) 115/45  Pulse:  79  Resp:  15  Temp:  36.9 C  SpO2:  96%    Last Pain:  Vitals:   09/26/17 0524  TempSrc:   PainSc: 4                  Jeanice Dempsey S

## 2017-09-26 NOTE — Progress Notes (Signed)
Pt had episode of slight confusion overnight and low grade fever up to 100.3. Patient has to returned to baseline mental status this morning; and temp WNL after APAP and incentive spirometry.

## 2017-09-26 NOTE — Social Work (Signed)
[facility] has offered a SNF bed.  CSW faxed clinicals to [facility] to begin the auth process for SNF placement.  CSW will continue to follow up.  Keene Breath, LCSW Clinical Social Worker/Weekend [phone]

## 2017-09-26 NOTE — Clinical Social Work Note (Signed)
Clinical Social Work Assessment  Patient Details  Name: Jackson [name] MRN: [id number] Date of Birth: June 23, 1929  Date of referral:  09/26/17               Reason for consult:  Facility Placement                Permission sought to share information with:  Case Manager Permission granted to share information::  Yes, Verbal Permission Granted  Name::     Jackson [name]::  SNF  Relationship::     Contact Information:     Housing/Transportation Living arrangements for the past 2 months:  [place] of Information:  Patient, Spouse Patient Interpreter Needed:  None Criminal Activity/Legal Involvement Pertinent to Current Situation/Hospitalization:  No - Comment as needed Significant Relationships:  Spouse Lives with:  Spouse Do you feel safe going back to the place where you live?  No Need for family participation in patient care:  No (Coment)  Care giving concerns:  Pt from home with spouse. Pt used cane at home occasionally since hip pain worsened. Pt was independent with activities of daily living per patient and spouse at bedside. Family agreeable to clinical recommendations for SNF and would prefer [facility]. CSW obtained permission to send to SNF's in  area as well.  CSW discussed insurance process and the need to obtain auth prior to SNF placement. Family in agreement. CSW answered all questions as pt has never gone to SNF in the past. 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  Social Worker assessment / plan:  CSW will f/u on SNF offers and Armed forces operational officer. CSW will set up transport at appropriate time.  Employment status:  Retired Nurse, adult PT Recommendations:  [place] / Referral to community resources:     Patient/Family's Response to care:  Patient/spouse appreciative that CSW met to discuss placement and thanked  CSW.  Patient/Family's Understanding of and Emotional Response to Diagnosis, Current Treatment, and Prognosis:  Patient/spouse has good understanding of need for SNF as they discussed with doctor and wold like to go to [facility] if available. 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 Pt wants get good therapy and hopes to have a great relationship with therapist at SNF like he does in [place]. CSW validated his concerns.  No other issues or concern identified.  Emotional Assessment Appearance:  Appears stated age Attitude/Demeanor/Rapport:  (Cooperative and Pleasant) Affect (typically observed):  Accepting, Appropriate Orientation:  Oriented to Situation, Oriented to  Time, Oriented to Place, Oriented to Self Alcohol / Substance use:  Not Applicable Psych involvement (Current and /or in the community):  No (Comment)  Discharge Needs  Concerns to be addressed:  Discharge Planning Concerns Readmission within the last 30 days:  No Current discharge risk:  Physical Impairment, Dependent with Mobility Barriers to Discharge:  No Barriers Identified   Normajean Baxter, LCSW 09/26/2017, 10:43 AM

## 2017-09-26 NOTE — Progress Notes (Signed)
Patient ID: Jackson [name], male   DOB: 01/27/1929, 84 y.o.   MRN: [id number] Patient is making slow progress with therapy.  Anticipate discharge to skilled nursing.  The social worker was discussing placement with patient this morning.

## 2017-09-27 DIAGNOSIS — M1990 Unspecified osteoarthritis, unspecified site: Secondary | ICD-10-CM | POA: Diagnosis not present

## 2017-09-27 DIAGNOSIS — E1165 Type 2 diabetes mellitus with hyperglycemia: Secondary | ICD-10-CM | POA: Diagnosis not present

## 2017-09-27 DIAGNOSIS — G8911 Acute pain due to trauma: Secondary | ICD-10-CM | POA: Diagnosis not present

## 2017-09-27 DIAGNOSIS — R278 Other lack of coordination: Secondary | ICD-10-CM | POA: Diagnosis not present

## 2017-09-27 DIAGNOSIS — R2689 Other abnormalities of gait and mobility: Secondary | ICD-10-CM | POA: Diagnosis not present

## 2017-09-27 DIAGNOSIS — M1612 Unilateral primary osteoarthritis, left hip: Secondary | ICD-10-CM | POA: Diagnosis not present

## 2017-09-27 DIAGNOSIS — M7989 Other specified soft tissue disorders: Secondary | ICD-10-CM | POA: Diagnosis not present

## 2017-09-27 DIAGNOSIS — S79911A Unspecified injury of right hip, initial encounter: Secondary | ICD-10-CM | POA: Diagnosis not present

## 2017-09-27 DIAGNOSIS — K219 Gastro-esophageal reflux disease without esophagitis: Secondary | ICD-10-CM | POA: Diagnosis not present

## 2017-09-27 DIAGNOSIS — M79605 Pain in left leg: Secondary | ICD-10-CM | POA: Diagnosis not present

## 2017-09-27 DIAGNOSIS — E1140 Type 2 diabetes mellitus with diabetic neuropathy, unspecified: Secondary | ICD-10-CM | POA: Diagnosis not present

## 2017-09-27 DIAGNOSIS — M6281 Muscle weakness (generalized): Secondary | ICD-10-CM | POA: Diagnosis not present

## 2017-09-27 DIAGNOSIS — E119 Type 2 diabetes mellitus without complications: Secondary | ICD-10-CM | POA: Diagnosis not present

## 2017-09-27 DIAGNOSIS — I350 Nonrheumatic aortic (valve) stenosis: Secondary | ICD-10-CM | POA: Diagnosis not present

## 2017-09-27 DIAGNOSIS — I10 Essential (primary) hypertension: Secondary | ICD-10-CM | POA: Diagnosis not present

## 2017-09-27 DIAGNOSIS — E785 Hyperlipidemia, unspecified: Secondary | ICD-10-CM | POA: Diagnosis not present

## 2017-09-27 DIAGNOSIS — Z96642 Presence of left artificial hip joint: Secondary | ICD-10-CM | POA: Diagnosis not present

## 2017-09-27 DIAGNOSIS — I82412 Acute embolism and thrombosis of left femoral vein: Secondary | ICD-10-CM | POA: Diagnosis not present

## 2017-09-27 LAB — GLUCOSE, CAPILLARY
GLUCOSE-CAPILLARY: 233 mg/dL — AB (ref 65–99)
GLUCOSE-CAPILLARY: 219 mg/dL — AB (ref 65–99)
GLUCOSE-CAPILLARY: 281 mg/dL — AB (ref 65–99)

## 2017-09-27 MED ORDER — OXYCODONE-ACETAMINOPHEN 5-325 MG PO TABS: 1.0000 | ORAL_TABLET | ORAL | 0 refills | Status: DC | PRN

## 2017-09-27 MED ORDER — ASPIRIN 325 MG PO TBEC: 325.0000 mg | DELAYED_RELEASE_TABLET | Freq: Every day | ORAL | 0 refills | Status: DC

## 2017-09-27 NOTE — Progress Notes (Signed)
Subjective: 3 Days Post-Op Procedure(s) (LRB): LEFT TOTAL HIP ARTHROPLASTY ANTERIOR APPROACH (Left) Patient reports pain as moderate.    Objective: Vital signs in last 24 hours: Temp:  [97.3 F (36.3 C)-98.3 F (36.8 C)] 98.1 F (36.7 C) (01/07 0458) Pulse Rate:  [84-88] 88 (01/07 0458) Resp:  [13-16] 15 (01/07 0458) BP: (124-154)/(50-51) 154/51 (01/07 0458) SpO2:  [94 %-98 %] 98 % (01/07 0458)  Intake/Output from previous day: 01/06 0701 - 01/07 0700 In: 780 [P.O.:780] Out: 275 [Urine:275] Intake/Output this shift: No intake/output data recorded.  Recent Labs    09/25/17 0546  HGB 13.3   Recent Labs    09/25/17 0546  WBC 11.4*  RBC 4.30  HCT 38.8*  PLT 247   Recent Labs    09/25/17 0546  NA 131*  K 4.7  CL 96*  CO2 25  BUN 19  CREATININE 1.19  GLUCOSE 325*  CALCIUM 9.4   No results for input(s): LABPT, INR in the last 72 hours.  Sensation intact distally Intact pulses distally Dorsiflexion/Plantar flexion intact Incision: scant drainage  Assessment/Plan: 3 Days Post-Op Procedure(s) (LRB): LEFT TOTAL HIP ARTHROPLASTY ANTERIOR APPROACH (Left) Up with therapy Discharge to SNF today  Kathryne Hitch 09/27/2017, 7:32 AM

## 2017-09-27 NOTE — Op Note (Signed)
NAME:  ZACK, HUNSAKER                    ACCOUNT NO.:  MEDICAL RECORD NO.:  [id number]  LOCATION:                                 FACILITY:  PHYSICIAN:  Vanita Panda. Magnus Ivan, M.D.DATE OF BIRTH:  DATE OF PROCEDURE:  09/24/2017 DATE OF DISCHARGE:                              OPERATIVE REPORT   PREOPERATIVE DIAGNOSIS:  Primary osteoarthritis and degenerative joint disease, left hip.  POSTOPERATIVE DIAGNOSIS:  Primary osteoarthritis and degenerative joint disease, left hip.  PROCEDURE:  Left total hip arthroplasty through direct anterior approach.  IMPLANTS:  DePuy Sector Gription acetabular component size 60, size 36 +4 polyethylene liner, size 12 Corail femoral component with varus offset, size 36+ 5 metal hip ball.  SURGEON:  Vanita Panda. Magnus Ivan, M.D.  ASSISTANT:  Richardean Canal, PA-C.  ANESTHESIA:  Spinal.  ANTIBIOTICS:  2 g of IV Ancef.  BLOOD LOSS:  250 mL.  COMPLICATIONS:  None.  INDICATIONS:  Mr. Meaker is an 84 year old individual with severe debilitating arthritis involving his left hip.  At this point, it is negatively impacted his activities of daily living, his quality of life and his mobility.  He is miserable.  He has tried and failed all forms of conservative treatment.  Given these findings, he does wish to proceed with a total hip arthroplasty through direct anterior approach. He understands that given his age, this is certainly a procedure does not without risk.  He understands the main risks for acute blood loss anemia, nerve and vessel injury, fracture, infection, dislocation, DVT. There are certainly perioperative cardiac risks as well given his heart block.  He understands the goals are to decrease pain, improve mobility and overall improved quality of life.  PROCEDURE DESCRIPTION:  After informed consent was obtained, appropriate left hip was marked.  He was brought to the operating room where spinal anesthesia was obtained while he was  on the stretcher.  Foley catheter was placed.  Next, he was placed supine on the [place]ana fracture table with the perineal post in place and both legs in inline skeletal traction boots, but no traction applied.  His left operative hip was prepped and draped with DuraPrep and sterile drapes.  A time-out was called and was identified as correct patient and correct left hip.  We then made an incision just inferior and posterior to the anterior superior iliac spine and carried this slightly obliquely down the leg.  We dissected down the tensor fascia lata muscle.  The tensor fascia was then divided longitudinally to proceed with a direct anterior approach to the hip. We identified and cauterized the circumflex vessels, then identified the hip capsule.  I opened up the hip capsule in an L-type format, finding moderate joint effusion and severe arthritis throughout his left hip. We placed Cobra retractors around the medial and lateral femoral neck and then made our femoral neck cut just proximal to the lesser trochanter with an oscillating saw and completed this with an osteotome. I placed a corkscrew guide in the femoral head and removed the femoral head in its entirety and found it to be very large and devoid of cartilage completely.  We then placed a bent Hohmann  over the medial acetabular rim and cleaned the remnants of acetabular labrum and other debris.  We then began reaming under direct visualization from a size 42 reamer in jumping increments several times, finally going up to a size 60 with all reamers under direct visualization and the last two reamers under direct fluoroscopy, so we could obtain our depth of reaming, our inclination and anteversion.  Once we did this, we placed the real DePuy Sector Gription acetabular component size 60 and a 36 +4 neutral polyethylene liner given that it was preoperatively shorter as well with his left leg being shorter than his right.  Attention was  then turned to the femur.  With the leg externally rotated to 120 degrees, extended and adducted, we were able to place a Mueller retractor medially and a Hohmann retractor behind the greater trochanter.  We released the lateral joint capsule and used a box cutting osteotome to enter the femoral canal and a rongeur to lateralize.  We then began broaching from a size 8 broach using the Corail broaching system going up to a size 13. With the 12 as well, we placed first and we did a trial reduction reducing the acetabulum, and we felt that we were stable and we dislocated the hip.  We felt like the trial 12 stem was a little too loose, so we went up to a 13 and 13 was slightly proud.  We felt that we could go with a shorter hip ball, so we placed the real size 13 stem and then we went on with a -2 metal hip ball.  We tried to reduce this in the acetabulum multiple times and we could not because the tissue was too tight.  We had to remove the 13 stem and then we placed a real Corail femoral component with varus offset size 12 and then we went with a real 36+ 5 metal hip ball, reduced this in the acetabulum.  Again, we were appreciated much more stability, leg length, offset and range of motion.  We then irrigated the soft tissue with normal saline solution using pulsatile lavage.  We closed remnants of the joint capsule with interrupted #1 Ethibond suture followed by #1 Vicryl in the tensor fascia, 0 Vicryl in the deep tissue, 2-0 Vicryl in the subcutaneous tissue, 4-0 Monocryl subcuticular stitch and Steri-Strips on the skin. An Aquacel dressing was applied.  He was taken to the recovery room in stable condition.  All final counts were correct.  There were no complications noted.     Vanita Panda. Magnus Ivan, M.D.     CYB/MEDQ  D:  09/24/2017  T:  09/25/2017  Job:  [id number]

## 2017-09-27 NOTE — Clinical Social Work Placement (Addendum)
Patient [employer] authorization pending via SNF. Clinical information sent.  SNF has received authorization.  [facility] called to transport.   CLINICAL SOCIAL WORK PLACEMENT  NOTE  Date:  09/27/2017  Patient Details  Name: Jackson [name] MRN: [id number] Date of Birth: Jan 15, 1929  Clinical Social Work is seeking post-discharge placement for this patient at the Skilled  Nursing Facility level of care (*CSW will initial, date and re-position this form in  chart as items are completed):  Yes   Patient/family provided with [place] Clinical Social Work Department's list of facilities offering this level of care within the geographic area requested by the patient (or if unable, by the patient's family).  Yes   Patient/family informed of their freedom to choose among providers that offer the needed level of care, that participate in Medicare, Medicaid or managed care program needed by the patient, have an available bed and are willing to accept the patient.  Yes   Patient/family informed of [place]'s ownership interest in [facility] and [facility], as well as of the fact that they are under no obligation to receive care at these facilities.  PASRR submitted to EDS on 09/26/17     PASRR number received on 09/26/17     Existing PASRR number confirmed on       FL2 transmitted to all facilities in geographic area requested by pt/family on       FL2 transmitted to all facilities within larger geographic area on 09/26/17     Patient informed that his/her managed care company has contracts with or will negotiate with certain facilities, including the following:  [facility]     Yes   Patient/family informed of bed offers received.  Patient chooses bed at [facility]     Physician recommends and patient chooses bed at      Patient to be transferred to [facility] on 09/27/17.  Patient to be transferred to facility by       Patient  family notified on 09/27/17 of transfer.  Name of family member notified:      Spouse at bedside.   PHYSICIAN       Additional Comment:    _______________________________________________ Clearance Coots, LCSW 09/27/2017, 9:20 AM

## 2017-09-27 NOTE — Care Management Important Message (Signed)
Important Message  Patient Details  Name: Jackson [name] MRN: [id number] Date of Birth: [birth year]   Medicare Important Message Given:  Yes    Caren Macadam 09/27/2017, 11:01 AMImportant Message  Patient Details  Name: Jackson [name] MRN: [id number] Date of Birth: 11/08/1928   Medicare Important Message Given:  Yes    Caren Macadam 09/27/2017, 11:01 AM

## 2017-09-27 NOTE — Discharge Instructions (Signed)

## 2017-09-27 NOTE — Progress Notes (Signed)
Physical Therapy Treatment Patient Details Name: Jackson [name] MRN: [id number] DOB: 01-24-1929 Today's Date: 09/27/2017    History of Present Illness LEFT TOTAL HIP ARTHROPLASTY DIRECT ANTERIOR APPROACH     PT Comments    Progressing with mobility. Reviewed exercises and gait training, Plan is for d/c to SNF on today.    Follow Up Recommendations  DC plan and follow up therapy as arranged by surgeon(SNF)     Equipment Recommendations  None recommended by PT    Recommendations for Other Services       Precautions / Restrictions Precautions Precautions: Fall Restrictions Weight Bearing Restrictions: No LLE Weight Bearing: Weight bearing as tolerated    Mobility  Bed Mobility Overal bed mobility: Needs Assistance Bed Mobility: Supine to Sit     Supine to sit: Min assist;HOB elevated     General bed mobility comments: Increased time. Assist for L LE initially.   Transfers Overall transfer level: Needs assistance Equipment used: Rolling walker (2 wheeled) Transfers: Sit to/from Stand Sit to Stand: Min assist         General transfer comment: Assist to rise, stabilize, control descent. VCS safety, hand placement, posture.   Ambulation/Gait Ambulation/Gait assistance: Min assist Ambulation Distance (Feet): 100 Feet Assistive device: Rolling walker (2 wheeled) Gait Pattern/deviations: Step-to pattern;Step-through pattern;Decreased stride length;Trunk flexed     General Gait Details: Cues for safety, posture, distance from RW. Assist to steady throughout distance.    Stairs            Wheelchair Mobility    Modified Rankin (Stroke Patients Only)       Balance                                            Cognition Arousal/Alertness: Awake/alert Behavior During Therapy: WFL for tasks assessed/performed Overall Cognitive Status: Within Functional Limits for tasks assessed                                         Exercises Total Joint Exercises Ankle Circles/Pumps: AROM;Both;Supine;10 reps Quad Sets: AROM;Both;10 reps;Supine Heel Slides: AAROM;Left;Supine;10 reps Hip ABduction/ADduction: AAROM;Left;Supine;10 reps    General Comments        Pertinent Vitals/Pain Pain Assessment: 0-10 Pain Score: 5  Pain Location: l hip Pain Descriptors / Indicators: Discomfort;Sore;Tightness Pain Intervention(s): Monitored during session;Repositioned    Home Living                      Prior Function            PT Goals (current goals can now be found in the care plan section) Progress towards PT goals: Progressing toward goals    Frequency    7X/week      PT Plan Current plan remains appropriate    Co-evaluation              AM-PAC PT "6 Clicks" Daily Activity  Outcome Measure  Difficulty turning over in bed (including adjusting bedclothes, sheets and blankets)?: Unable Difficulty moving from lying on back to sitting on the side of the bed? : Unable Difficulty sitting down on and standing up from a chair with arms (e.g., wheelchair, bedside commode, etc,.)?: Unable Help needed moving to and from a bed to chair (including a wheelchair)?: A  Little   Help needed climbing 3-5 steps with a railing? : A Little 6 Click Score: 9    End of Session Equipment Utilized During Treatment: Gait belt Activity Tolerance: Patient tolerated treatment well Patient left: in chair;with call bell/phone within reach;with family/visitor present   PT Visit Diagnosis: Muscle weakness (generalized) (M62.81);Difficulty in walking, not elsewhere classified (R26.2);Pain Pain - Right/Left: Left Pain - part of body: Leg     Time: 0920-0950 PT Time Calculation (min) (ACUTE ONLY): 30 min  Charges:  $Gait Training: 8-22 mins $Therapeutic Exercise: 8-22 mins                    G Codes:          Rebeca Alert, MPT Pager: [phone]

## 2017-09-27 NOTE — Discharge Summary (Signed)
Patient ID: Jackson [name] MRN: [id number] DOB/AGE: [birth year] 82 y.o.  Admit date: 09/24/2017 Discharge date: 09/27/2017  Admission Diagnoses:  Principal Problem:   Unilateral primary osteoarthritis, left hip Active Problems:   Status post total replacement of left hip   Discharge Diagnoses:  Same  Past Medical History:  Diagnosis Date  . Aortic stenosis    Mild LVH, EF 60-65, normal wall motion, grade 1 diastolic dysfunction, mild aortic stenosis (mean 12/peak 22), trivial MR, normal RVSF, PASP 47 (mild pulmonary hypertension)  . Atherosclerosis of aorta (HCC) 12/16/2016   CXR 6/16: IMPRESSION: 1. Resolved left basilar airspace opacities. 2. Hazy nodularity in both lungs compatible with the patient's known pleural plaques. 3. Right anterior hemidiaphragmatic eventration. 4. Thoracic spondylosis. 5. Atherosclerotic calcification of the aortic arch.  . Bradycardia    Low HR (30s) noted during sleep during admx for sepsis in 2016; Mobitz 1, 2:1 block >> no indication for pacer; management of OSA recommended   . Diabetes mellitus without complication (HCC)   . Diabetic neuropathy (HCC)   . DJD (degenerative joint disease)    knee, hip  . GERD (gastroesophageal reflux disease)   . History of echocardiogram    a. Echo 6/16: mod LVH, EF 65-70, vigorous LVF, no RWMA, mild MR, mild LAE  . HLD (hyperlipidemia)   . Phlebitis of left leg   . Pneumonia 02/2015   admx with sepsis     Surgeries: Procedure(s): LEFT TOTAL HIP ARTHROPLASTY ANTERIOR APPROACH on 09/24/2017   Consultants:   Discharged Condition: Improved  Hospital Course: LOVELACE CERVENY is an 84 y.o. male who was admitted 09/24/2017 for operative treatment ofUnilateral primary osteoarthritis, left hip. Patient has severe unremitting pain that affects sleep, daily activities, and work/hobbies. After pre-op clearance the patient was taken to the operating room on 09/24/2017 and underwent  Procedure(s): LEFT TOTAL HIP ARTHROPLASTY  ANTERIOR APPROACH.    Patient was given perioperative antibiotics:  Anti-infectives (From admission, onward)   Start     Dose/Rate Route Frequency Ordered Stop   09/24/17 1800  ceFAZolin (ANCEF) IVPB 1 g/50 mL premix     1 g 100 mL/hr over 30 Minutes Intravenous Every 6 hours 09/24/17 1542 09/25/17 0230   09/24/17 0956  ceFAZolin (ANCEF) 2-4 GM/100ML-% IVPB    Comments:  Curlene Dolphin   : cabinet override      09/24/17 1191 09/24/17 1154   09/24/17 0952  ceFAZolin (ANCEF) IVPB 2g/100 mL premix     2 g 200 mL/hr over 30 Minutes Intravenous On call to O.R. 09/24/17 4782 09/24/17 1203       Patient was given sequential compression devices, early ambulation, and chemoprophylaxis to prevent DVT.  Patient benefited maximally from hospital stay and there were no complications.    Recent vital signs:  Patient Vitals for the past 24 hrs:  BP Temp Temp src Pulse Resp SpO2  09/27/17 0458 (!) 154/51 98.1 F (36.7 C) Axillary 88 15 98 %  09/26/17 2202 (!) 124/50 (!) 97.3 F (36.3 C) Axillary 84 13 94 %  09/26/17 1334 - 98.3 F (36.8 C) Oral - 16 97 %     Recent laboratory studies:  Recent Labs    09/25/17 0546  WBC 11.4*  HGB 13.3  HCT 38.8*  PLT 247  NA 131*  K 4.7  CL 96*  CO2 25  BUN 19  CREATININE 1.19  GLUCOSE 325*  CALCIUM 9.4     Discharge Medications:   Allergies as of 09/27/2017  Reactions   Actos [pioglitazone] Swelling   Formaldehyde Other (See Comments)   Flu-like symptoms      Medication List    TAKE these medications   ACCU-CHEK AVIVA PLUS test strip Generic drug:  glucose blood   aspirin 325 MG EC tablet Take 1 tablet (325 mg total) by mouth daily with breakfast.   B-D SINGLE USE SWABS REGULAR Pads   CALCIUM 600+D PO Take 1 tablet by mouth daily.   gabapentin 100 MG capsule Commonly known as:  NEURONTIN Take 100 mg by mouth 3 (three) times daily.   insulin aspart 100 UNIT/ML FlexPen Commonly known as:  NOVOLOG FLEXPEN Before each  meal 3 times a day, 140-199 - 2 units, 200-250 - 4 units, 251-299 - 6 units,  300-349 - 8 units,  350 or above 10 units. What changed:    how much to take  how to take this  when to take this  additional instructions   Insulin Glargine 100 UNIT/ML Solostar Pen Commonly known as:  LANTUS SOLOSTAR Inject 10 Units into the skin daily at 10 pm. What changed:  how much to take   lisinopril 2.5 MG tablet Commonly known as:  PRINIVIL,ZESTRIL Take 2.5 mg by mouth daily.   MULTIVITAMIN PO Take 1 tablet by mouth daily. Centrum Silver   naproxen sodium 220 MG tablet Commonly known as:  ALEVE Take 220 mg by mouth 2 (two) times daily.   omeprazole 20 MG capsule Commonly known as:  PRILOSEC Take 20 mg by mouth daily.   oxyCODONE-acetaminophen 5-325 MG tablet Commonly known as:  ROXICET Take 1-2 tablets by mouth every 4 (four) hours as needed.   simvastatin 40 MG tablet Commonly known as:  ZOCOR Take 40 mg by mouth at bedtime.   tetrahydrozoline-zinc 0.05-0.25 % ophthalmic solution Commonly known as:  VISINE-AC Place 2 drops into both eyes 3 (three) times daily as needed (dry eyes).            Durable Medical Equipment  (From admission, onward)        Start     Ordered   09/24/17 1543  DME 3 n 1  Once     09/24/17 1542   09/24/17 1543  DME Walker rolling  Once    Question:  Patient needs a walker to treat with the following condition  Answer:  Status post total replacement of left hip   09/24/17 1542      Diagnostic Studies: Dg Pelvis Portable  Result Date: 09/24/2017 CLINICAL DATA:  Status post left hip replacement. EXAM: PORTABLE PELVIS 1-2 VIEWS COMPARISON:  Intraoperative fluoroscopic images today. Pelvic radiographs 08/11/2017. FINDINGS: Sequelae of recent left total hip arthroplasty are again identified. Postoperative gas is noted in the adjacent soft tissues. The prosthetic components appear normally located on this single projection. No acute fracture is  identified. Mild right hip osteoarthrosis is noted. IMPRESSION: Left total hip arthroplasty without evidence of immediate complication. Electronically Signed   By: Sebastian Ache M.D.   On: 09/24/2017 14:51   Dg C-arm 1-60 Min-no Report  Result Date: 09/24/2017 Fluoroscopy was utilized by the requesting physician.  No radiographic interpretation.   Dg Hip Operative Unilat W Or W/o Pelvis Left  Result Date: 09/24/2017 CLINICAL DATA:  Left hip arthroplasty. EXAM: OPERATIVE LEFT HIP (WITH PELVIS IF PERFORMED) TECHNIQUE: Fluoroscopic spot image(s) were submitted for interpretation post-operatively. COMPARISON:  Pelvic x-rays dated August 11, 2017. FINDINGS: Interval left total hip arthroplasty. Components are well aligned. No hardware complication. IMPRESSION: Left  total hip arthroplasty without complication. Electronically Signed   By: Obie Dredge M.D.   On: 09/24/2017 13:32    Disposition: to skilled nursing facility  Discharge Instructions    Discharge patient   Complete by:  As directed    Discharge disposition:  03-Skilled Nursing Facility   Discharge patient date:  09/27/2017      Follow-up Information    Kathryne Hitch, MD Follow up in 2 week(s).   Specialty:  Orthopedic Surgery Contact information: [address] [place] [address] [phone]            Signed: Kathryne Hitch 09/27/2017, 7:35 AM

## 2017-09-28 DIAGNOSIS — E119 Type 2 diabetes mellitus without complications: Secondary | ICD-10-CM | POA: Diagnosis not present

## 2017-09-28 DIAGNOSIS — K219 Gastro-esophageal reflux disease without esophagitis: Secondary | ICD-10-CM | POA: Diagnosis not present

## 2017-09-28 DIAGNOSIS — I10 Essential (primary) hypertension: Secondary | ICD-10-CM | POA: Diagnosis not present

## 2017-09-28 DIAGNOSIS — M1612 Unilateral primary osteoarthritis, left hip: Secondary | ICD-10-CM | POA: Diagnosis not present

## 2017-10-01 ENCOUNTER — Other Ambulatory Visit: Payer: Self-pay | Admitting: *Deleted

## 2017-10-01 DIAGNOSIS — I350 Nonrheumatic aortic (valve) stenosis: Secondary | ICD-10-CM | POA: Diagnosis not present

## 2017-10-01 DIAGNOSIS — E1140 Type 2 diabetes mellitus with diabetic neuropathy, unspecified: Secondary | ICD-10-CM | POA: Diagnosis not present

## 2017-10-01 DIAGNOSIS — Z96642 Presence of left artificial hip joint: Secondary | ICD-10-CM | POA: Diagnosis not present

## 2017-10-01 DIAGNOSIS — M1990 Unspecified osteoarthritis, unspecified site: Secondary | ICD-10-CM | POA: Diagnosis not present

## 2017-10-01 DIAGNOSIS — K219 Gastro-esophageal reflux disease without esophagitis: Secondary | ICD-10-CM | POA: Diagnosis not present

## 2017-10-01 NOTE — Patient Outreach (Signed)
[facility]) Care Management  10/01/2017  KAIUS SALBER 01-16-1929 [id number]  Referral via [employer]; member was discharged from inpatient admission from [facility] on 09/27/2017:  Call#1 to patient; left HIPPA compliant voice mail requesting call back.  Plan: Will follow up.  Colleen Can, RN BSN CCM Care Management Coordinator [facility] Care Management  [phone]  .

## 2017-10-01 NOTE — Progress Notes (Signed)
This encounter was created in error - please disregard.

## 2017-10-04 ENCOUNTER — Other Ambulatory Visit: Payer: Self-pay | Admitting: *Deleted

## 2017-10-04 NOTE — Patient Outreach (Signed)
[facility]) Care Management  10/04/2017  Jackson [name] Nov 07, 1928 [id number]  Referral via [employer]; member was discharged from inpatient admission from [facility] on 09/27/2017:  Telephone call x 2; left HIPPA compliant voice mail requesting call back.  Plan: Will follow up.  Colleen Can, RN BSN CCM Care Management Coordinator [facility] Care Management  [phone]

## 2017-10-05 DIAGNOSIS — M1612 Unilateral primary osteoarthritis, left hip: Secondary | ICD-10-CM | POA: Diagnosis not present

## 2017-10-05 DIAGNOSIS — E119 Type 2 diabetes mellitus without complications: Secondary | ICD-10-CM | POA: Diagnosis not present

## 2017-10-05 DIAGNOSIS — I10 Essential (primary) hypertension: Secondary | ICD-10-CM | POA: Diagnosis not present

## 2017-10-05 DIAGNOSIS — E785 Hyperlipidemia, unspecified: Secondary | ICD-10-CM | POA: Diagnosis not present

## 2017-10-06 ENCOUNTER — Telehealth (INDEPENDENT_AMBULATORY_CARE_PROVIDER_SITE_OTHER): Payer: Self-pay | Admitting: Orthopaedic Surgery

## 2017-10-06 ENCOUNTER — Telehealth (INDEPENDENT_AMBULATORY_CARE_PROVIDER_SITE_OTHER): Payer: Self-pay | Admitting: Radiology

## 2017-10-06 ENCOUNTER — Other Ambulatory Visit (INDEPENDENT_AMBULATORY_CARE_PROVIDER_SITE_OTHER): Payer: Self-pay

## 2017-10-06 ENCOUNTER — Encounter (INDEPENDENT_AMBULATORY_CARE_PROVIDER_SITE_OTHER): Payer: Self-pay | Admitting: Orthopaedic Surgery

## 2017-10-06 ENCOUNTER — Ambulatory Visit (INDEPENDENT_AMBULATORY_CARE_PROVIDER_SITE_OTHER): Payer: Medicare HMO | Admitting: Orthopaedic Surgery

## 2017-10-06 ENCOUNTER — Ambulatory Visit (HOSPITAL_COMMUNITY)
Admission: RE | Admit: 2017-10-06 | Discharge: 2017-10-06 | Disposition: A | Discharge status: Home / Self Care | Payer: Medicare HMO | Source: Ambulatory Visit | Attending: Orthopaedic Surgery | Admitting: Orthopaedic Surgery

## 2017-10-06 ENCOUNTER — Other Ambulatory Visit: Payer: Self-pay | Admitting: *Deleted

## 2017-10-06 DIAGNOSIS — M79605 Pain in left leg: Secondary | ICD-10-CM

## 2017-10-06 DIAGNOSIS — M7989 Other specified soft tissue disorders: Secondary | ICD-10-CM

## 2017-10-06 DIAGNOSIS — Z96642 Presence of left artificial hip joint: Secondary | ICD-10-CM

## 2017-10-06 DIAGNOSIS — I82412 Acute embolism and thrombosis of left femoral vein: Secondary | ICD-10-CM | POA: Insufficient documentation

## 2017-10-06 NOTE — Patient Outreach (Addendum)
[facility] Care Management  10/06/2017  Jackson [name] 02-13-1929 [id number]   Referral via [employer]; member was discharged from inpatient admission from [facility] on 09/27/2017:  Per Notes patient as Skilled facility-[facility].  Telephone call to [facility]; spoke with discharge planner who advised that patient is receiving rehabilitation services with plans return to home tomorrow.   Plan: Will follow up.   Colleen Can, RN BSN CCM Care Management Coordinator [facility] Care Management  [phone]

## 2017-10-06 NOTE — Telephone Encounter (Signed)
Yes, that will be fine.

## 2017-10-06 NOTE — Telephone Encounter (Signed)
Alan Ripper from [facility] called to advise that patient is positive for DVT in the LEFT Common Femoral Vein.  ---Rexene Edison, [facility] has been notified

## 2017-10-06 NOTE — Telephone Encounter (Signed)
Kenney Houseman called from [facility] saying the patient will be discharged tomorrow and was needing to know if Dr. Magnus Ivan would like the patient to have a nursing, PT/OT evaluation. CB # [phone]

## 2017-10-06 NOTE — Telephone Encounter (Signed)
Verbal order given to [facility]

## 2017-10-06 NOTE — Progress Notes (Signed)
*  PRELIMINARY RESULTS* Vascular Ultrasound Left lower extremity venous duplex has been completed.  Preliminary findings: Focal segment of acute deep vein thrombosis in the left common femoral vein.  Other visualized veins of the left lower extremity appear negative for deep vein thrombosis.  Preliminary results called to Dr. Eliberto Ivory office at 16:00, given to Adel. Pt okay to leave, anticoagulants will be available for him at rehab facility.  Chauncey Fischer 10/06/2017, 4:02 PM

## 2017-10-06 NOTE — Progress Notes (Signed)
HPI Mr. Noel Gerold returns today almost 2 weeks status post left total hip arthroplasty.  Is overall doing well.  He is having some calf pain on the left.  He has not been on any type of anticoagulant his family member states he is taking aspirin.  He is taking no pain medications.  He is on Aleve.  Otherwise doing well.  Denies any shortness of breath fevers chills chest pain.  Physical exam left hip: Surgical incisions healing well.  Slight seroma versus hematoma.  Left calf supple with tenderness.  He has full dorsiflexion plantarflexion ankle.  He has overall good range of motion of the hip.  Does have some edema of the left lower leg with some ecchymosis over the lateral malleolus region heel region.  Impression: 2 weeks status post left total hip arthroplasty  Plan: We will obtain a Doppler of his left lower leg rule out DVT.  Steri-Strips removed new Steri-Strips applied.  He is able to get the incision wet in the shower.  Continue work with PT for overall strengthening range of motion left hip.  He will follow-up with [place] in 1 month sooner if there is any questions or concerns.

## 2017-10-06 NOTE — Telephone Encounter (Signed)
See below

## 2017-10-06 NOTE — Telephone Encounter (Signed)
noted 

## 2017-10-07 ENCOUNTER — Telehealth (INDEPENDENT_AMBULATORY_CARE_PROVIDER_SITE_OTHER): Payer: Self-pay | Admitting: Orthopaedic Surgery

## 2017-10-07 NOTE — Telephone Encounter (Signed)
Please advise 

## 2017-10-07 NOTE — Telephone Encounter (Signed)
Demetria Pore from [facility] called stating that they were wanting to discharge the patient today and wanted to know if Dr. Magnus Ivan is okay with that.  Patient's insurance runs out today.  CB#[phone].  Thank  You.

## 2017-10-07 NOTE — Telephone Encounter (Signed)
That is fine from our standpoint.  Needs home health set up

## 2017-10-07 NOTE — Telephone Encounter (Signed)
[facility] aware of the below message

## 2017-10-08 ENCOUNTER — Other Ambulatory Visit: Payer: Self-pay | Admitting: *Deleted

## 2017-10-08 ENCOUNTER — Encounter: Payer: Self-pay | Admitting: *Deleted

## 2017-10-08 ENCOUNTER — Telehealth (INDEPENDENT_AMBULATORY_CARE_PROVIDER_SITE_OTHER): Payer: Self-pay | Admitting: Orthopaedic Surgery

## 2017-10-08 DIAGNOSIS — Z794 Long term (current) use of insulin: Secondary | ICD-10-CM | POA: Diagnosis not present

## 2017-10-08 DIAGNOSIS — Z471 Aftercare following joint replacement surgery: Secondary | ICD-10-CM | POA: Diagnosis not present

## 2017-10-08 DIAGNOSIS — E1140 Type 2 diabetes mellitus with diabetic neuropathy, unspecified: Secondary | ICD-10-CM | POA: Diagnosis not present

## 2017-10-08 DIAGNOSIS — I350 Nonrheumatic aortic (valve) stenosis: Secondary | ICD-10-CM | POA: Diagnosis not present

## 2017-10-08 DIAGNOSIS — Z96642 Presence of left artificial hip joint: Secondary | ICD-10-CM | POA: Diagnosis not present

## 2017-10-08 DIAGNOSIS — I700 Atherosclerosis of aorta: Secondary | ICD-10-CM | POA: Diagnosis not present

## 2017-10-08 DIAGNOSIS — Z7901 Long term (current) use of anticoagulants: Secondary | ICD-10-CM | POA: Diagnosis not present

## 2017-10-08 NOTE — Telephone Encounter (Signed)
Skilled nursing  [facility]  [place]  [phone]  Once a week for one week  Twice a week for two week

## 2017-10-08 NOTE — Telephone Encounter (Signed)
Ok for orders? 

## 2017-10-08 NOTE — Patient Outreach (Signed)
[facility]) Care Management  10/08/2017   [place] [name] [birth year] [id number]   Referral via Health plan-HTA; member was discharged from inpatient admission 09/27/2017 from [facility].   Per hx patient was transferred to Skilled facility -[facility] for rehabilitative services following anterior hip replacement-left 09/24/2017. DX: primary osteoarthritis. Other dxs-Diabetes 2 with neuropathy, HTN  Subjective:  Telephone call # 1 to patient who was advised of reason for call.  Hippa verification received. Patient gave spouse/caregiver permission to speak with this care coordinator about his health concerns. Patient's spouse states patient is hard of hearing. States she assists with his care since recent hip replacement 09/24/2017. Voices he currently is home from rehab center with discharge of 10/07/2017. States Home care will provide physical therapy services & first visit is today.  States patient does have blood clot in left groin area(DVT) States he attended hospital follow up appointment with orthopedist(Dr. Meyer Russel) 01/17. States test confirmed blood clot. States he is currently taking blood thinner (xarelto) as ordered by MD. Caregiver states she & patient prepare medications & patient  takes as prescribed. States patient is currently using walker to get around. States he is able to transfer from walker or wheelchair to car to has no problem travelling. States she takes patient to MD appointments. States has not made appointment with primary care provider but had last appointment in December 2018. Caregiver/spouse encouraged to touch base with primary care office to set up flow up appointment since recent hospital & rehab stay. Voices understanding & plans to call primary care office.  Objective:  Per notes  Left hip replacement-anterior approach-09/24/2017 Discharged from hospital 01/07 with admission to [facility] for rehabilitative services.   Current  Medications:  Current Outpatient Medications  Medication Sig Dispense Refill  . ACCU-CHEK AVIVA PLUS test strip     . Alcohol Swabs (B-D SINGLE USE SWABS REGULAR) PADS     . Calcium Carb-Cholecalciferol (CALCIUM CARBONATE-VITAMIN D3 PO) Take by mouth 1 day or 1 dose.    . gabapentin (NEURONTIN) 100 MG capsule Take 100 mg by mouth 3 (three) times daily.     . insulin aspart (NOVOLOG FLEXPEN) 100 UNIT/ML FlexPen Before each meal 3 times a day, 140-199 - 2 units, 200-250 - 4 units, 251-299 - 6 units,  300-349 - 8 units,  350 or above 10 units. (Patient taking differently: Inject 2-6 Units into the skin 3 (three) times daily before meals. Before each meal 3 times a day, 140-199 - 2 units, 200-250 - 4 units, 251-299 - 6 units,  300-349 - 8 units,  350 or above 10 units.) 15 mL 11  . Insulin Glargine (LANTUS SOLOSTAR) 100 UNIT/ML Solostar Pen Inject 10 Units into the skin daily at 10 pm. (Patient taking differently: Inject 15 Units into the skin daily at 10 pm. ) 15 mL 11  . lisinopril (PRINIVIL,ZESTRIL) 2.5 MG tablet Take 2.5 mg by mouth daily.     . methocarbamol (ROBAXIN) 500 MG tablet Take 500 mg by mouth every 8 (eight) hours as needed for muscle spasms.    Marland Kitchen omeprazole (PRILOSEC) 20 MG capsule Take 20 mg by mouth daily.     . rivaroxaban (XARELTO) 20 MG TABS tablet Take 20 mg by mouth daily with supper. Spouse/caregiver states patient is to take 20 mg twice daily for 21 days    . simvastatin (ZOCOR) 40 MG tablet Take 40 mg by mouth at bedtime.     Marland Kitchen tetrahydrozoline-zinc (VISINE-AC) 0.05-0.25 % ophthalmic solution  Place 2 drops into both eyes 3 (three) times daily as needed (dry eyes).    . traMADol (ULTRAM) 50 MG tablet Take 50 mg by mouth every 6 (six) hours as needed.    Marland Kitchen aspirin EC 325 MG EC tablet Take 1 tablet (325 mg total) by mouth daily with breakfast. (Patient not taking: Reported on 10/08/2017) 30 tablet 0  . Calcium Carbonate-Vitamin D (CALCIUM 600+D PO) Take 1 tablet by mouth daily.     . Multiple Vitamins-Minerals (MULTIVITAMIN PO) Take 1 tablet by mouth daily. Centrum Silver    . naproxen sodium (ALEVE) 220 MG tablet Take 220 mg by mouth 2 (two) times daily.    Marland Kitchen oxyCODONE-acetaminophen (ROXICET) 5-325 MG tablet Take 1-2 tablets by mouth every 4 (four) hours as needed. (Patient not taking: Reported on 10/08/2017) 60 tablet 0   No current facility-administered medications for this visit.     Functional Status:  In your present state of health, do you have any difficulty performing the following activities: 10/08/2017 09/24/2017  Hearing? Y Y  Comment - -  Vision? Y Y  Difficulty concentrating or making decisions? N N  Walking or climbing stairs? Y Y  Dressing or bathing? Y N  Doing errands, shopping? Y N  Some recent data might be hidden    Fall/Depression Screening: Fall Risk  10/08/2017  Falls in the past year? No  Risk for fall due to : Impaired mobility   No flowsheet data found.  Assessment:  Patient has strong support from spouse and other family members. [facility] in place for PT services at home. Transportation being provided by spouse. Agrees to [facility] care management services. Left total hip replacement-anterior approach -09/24/2017 Positive for Left DVT in groin Spouse/caregiver & patient manage patient medications.  Plan:  Will complete & follow care plan. Send involvement letter to primary care provider. Send Welcome packet & educational literature to patient. Will follow up with patient in one week  Colleen Can, RN BSN CCM Care Management Coordinator [facility] Care Management  [phone]

## 2017-10-08 NOTE — Telephone Encounter (Signed)
IC advised.  

## 2017-10-08 NOTE — Telephone Encounter (Signed)
That will be fine. Thanks 

## 2017-10-11 ENCOUNTER — Telehealth (INDEPENDENT_AMBULATORY_CARE_PROVIDER_SITE_OTHER): Payer: Self-pay | Admitting: Orthopaedic Surgery

## 2017-10-11 ENCOUNTER — Other Ambulatory Visit (INDEPENDENT_AMBULATORY_CARE_PROVIDER_SITE_OTHER): Payer: Self-pay

## 2017-10-11 MED ORDER — RIVAROXABAN 20 MG PO TABS: ORAL_TABLET | ORAL | 0 refills | Status: DC

## 2017-10-11 NOTE — Telephone Encounter (Signed)
Should be a prescription for Xarelto 20 mg bid x 3 weeks and then 20mg  once daily for 3 months

## 2017-10-11 NOTE — Telephone Encounter (Signed)
faxed

## 2017-10-11 NOTE — Telephone Encounter (Signed)
Please advise 

## 2017-10-11 NOTE — Telephone Encounter (Signed)
Patients wife called needing 2 prescriptions for Xarelto, she states he needs a 2 week supply 2 times a day of the 15 mg. And then a 3 month supply of the 20 mg 1 time a day. It needs to be called in to the [facility] on [address]

## 2017-10-12 ENCOUNTER — Telehealth (INDEPENDENT_AMBULATORY_CARE_PROVIDER_SITE_OTHER): Payer: Self-pay | Admitting: Orthopaedic Surgery

## 2017-10-12 ENCOUNTER — Encounter: Payer: Self-pay | Admitting: *Deleted

## 2017-10-12 NOTE — Telephone Encounter (Signed)
FYI---pharmacy called back and said the way we want xarelto his insurance won't pay for it  So he will have to do the "starter pack" which is 15mg  BID for 3 weeks, then 20mg  daily for 3 months

## 2017-10-12 NOTE — Telephone Encounter (Signed)
Christy with [facility] called needing clarification on this prescription, patients wife thinks wrong prescription was called in yesterday. And she just wants to be sure she is relaying correct information. I did advise her of what was in the below message but she would still like a call back from you.

## 2017-10-12 NOTE — Telephone Encounter (Signed)
That is correct 

## 2017-10-12 NOTE — Telephone Encounter (Signed)
Patient wife aware that he is to ONLY take 20mg  per [facility], 20mg  twice a day for 3 weeks, then once a day for 3 months

## 2017-10-12 NOTE — Telephone Encounter (Signed)
Patient spouse called and stated a RX refill for the Xarelto 15mg  for two weeks was never alled in.   Only the Xarelto 20 mg for one a day for 3 months WAS called in.  [facility] and call patient to advise it has been called in.

## 2017-10-13 ENCOUNTER — Ambulatory Visit (INDEPENDENT_AMBULATORY_CARE_PROVIDER_SITE_OTHER): Payer: Medicare HMO | Admitting: Physician Assistant

## 2017-10-13 ENCOUNTER — Encounter (INDEPENDENT_AMBULATORY_CARE_PROVIDER_SITE_OTHER): Payer: Self-pay | Admitting: Physician Assistant

## 2017-10-13 DIAGNOSIS — E1140 Type 2 diabetes mellitus with diabetic neuropathy, unspecified: Secondary | ICD-10-CM | POA: Diagnosis not present

## 2017-10-13 DIAGNOSIS — Z794 Long term (current) use of insulin: Secondary | ICD-10-CM | POA: Diagnosis not present

## 2017-10-13 DIAGNOSIS — Z96642 Presence of left artificial hip joint: Secondary | ICD-10-CM

## 2017-10-13 DIAGNOSIS — Z471 Aftercare following joint replacement surgery: Secondary | ICD-10-CM | POA: Diagnosis not present

## 2017-10-13 DIAGNOSIS — Z7901 Long term (current) use of anticoagulants: Secondary | ICD-10-CM | POA: Diagnosis not present

## 2017-10-13 DIAGNOSIS — I350 Nonrheumatic aortic (valve) stenosis: Secondary | ICD-10-CM | POA: Diagnosis not present

## 2017-10-13 DIAGNOSIS — I700 Atherosclerosis of aorta: Secondary | ICD-10-CM | POA: Diagnosis not present

## 2017-10-13 MED ORDER — DOXYCYCLINE HYCLATE 100 MG PO TABS: 100.0000 mg | ORAL_TABLET | Freq: Two times a day (BID) | ORAL | 0 refills | Status: DC

## 2017-10-13 NOTE — Telephone Encounter (Signed)
error 

## 2017-10-13 NOTE — Progress Notes (Signed)
HPI: Mr. Jackson [name] returns today follow-up of his left hip.  He is now about 3 weeks postop.  He states there is pocket of fluid around the hip like to see what can be done about it.  Said no fevers chills shortness of breath chest pain.  He was found to have a DVT and is now on Xarelto.  Physical exam left hip he does have some slight erythema about the incision.  There is no active drainage.  There is no dehiscence of the wound.  He does have a slight seroma.  He ambulates with a walker.  Procedure: Left hip prepped with Betadine and ethyl chloride used to anesthetize skin and then 60 cc of seroma fluid is aspirated patient tolerates well.  Impression: 19 days status post left total hip arthroplasty  Left leg DVT Plan: Have him wash the wound with an antibacterial soap daily.  Did apply a thin layer of mupirocin.  He is given a sample of mupirocin he will apply this twice daily.  We did also place him on empirical doxycycline 100 mg 1 p.o. twice daily for 7 days.  He will follow-up with [place] in 1 week check his wound.

## 2017-10-15 ENCOUNTER — Other Ambulatory Visit: Payer: Self-pay | Admitting: *Deleted

## 2017-10-15 DIAGNOSIS — Z96642 Presence of left artificial hip joint: Secondary | ICD-10-CM | POA: Diagnosis not present

## 2017-10-15 DIAGNOSIS — E1140 Type 2 diabetes mellitus with diabetic neuropathy, unspecified: Secondary | ICD-10-CM | POA: Diagnosis not present

## 2017-10-15 DIAGNOSIS — I700 Atherosclerosis of aorta: Secondary | ICD-10-CM | POA: Diagnosis not present

## 2017-10-15 DIAGNOSIS — Z471 Aftercare following joint replacement surgery: Secondary | ICD-10-CM | POA: Diagnosis not present

## 2017-10-15 DIAGNOSIS — Z7901 Long term (current) use of anticoagulants: Secondary | ICD-10-CM | POA: Diagnosis not present

## 2017-10-15 DIAGNOSIS — I350 Nonrheumatic aortic (valve) stenosis: Secondary | ICD-10-CM | POA: Diagnosis not present

## 2017-10-15 DIAGNOSIS — Z794 Long term (current) use of insulin: Secondary | ICD-10-CM | POA: Diagnosis not present

## 2017-10-15 NOTE — Patient Outreach (Signed)
[facility]) Care Management  10/15/2017   Jackson [name] Sep 30, 1928 [id number]   Transition of care call #3 Hx: recent discharge from "SNF" for rehabilitation following hip replacement.  Patient was diagnosed with DVT in common femoral vein.  Subjective: Spoke with patient via phone who gave HIPPA verification. Patient states he is progressing well and currently only requiring tylenol for pain relief.  Voices he had no readmission since previous hospital/rehab stay. States he has been taking medications as prescribed by his MDs. Voices that he has not had chest pain, shortness of breath or coughing up blood.  Patient advised of importance of seeking medical assistance if he has signs of DVT complications.  Patient voices understanding. States he continues to receive Home health physical therapy and uses walker to get around. States he attended orthopedic visit 01/23. .   Current Medications:  Current Outpatient Medications  Medication Sig Dispense Refill  . ACCU-CHEK AVIVA PLUS test strip     . Alcohol Swabs (B-D SINGLE USE SWABS REGULAR) PADS     . aspirin EC 325 MG EC tablet Take 1 tablet (325 mg total) by mouth daily with breakfast. 30 tablet 0  . Calcium Carb-Cholecalciferol (CALCIUM CARBONATE-VITAMIN D3 PO) Take by mouth 1 day or 1 dose.    . Calcium Carbonate-Vitamin D (CALCIUM 600+D PO) Take 1 tablet by mouth daily.    Marland Kitchen doxycycline (VIBRA-TABS) 100 MG tablet Take 1 tablet (100 mg total) by mouth 2 (two) times daily. 14 tablet 0  . gabapentin (NEURONTIN) 100 MG capsule Take 100 mg by mouth 3 (three) times daily.     . insulin aspart (NOVOLOG FLEXPEN) 100 UNIT/ML FlexPen Before each meal 3 times a day, 140-199 - 2 units, 200-250 - 4 units, 251-299 - 6 units,  300-349 - 8 units,  350 or above 10 units. (Patient taking differently: Inject 2-6 Units into the skin 3 (three) times daily before meals. Before each meal 3 times a day, 140-199 - 2 units, 200-250 - 4 units,  251-299 - 6 units,  300-349 - 8 units,  350 or above 10 units.) 15 mL 11  . Insulin Glargine (LANTUS SOLOSTAR) 100 UNIT/ML Solostar Pen Inject 10 Units into the skin daily at 10 pm. (Patient taking differently: Inject 15 Units into the skin daily at 10 pm. ) 15 mL 11  . lisinopril (PRINIVIL,ZESTRIL) 2.5 MG tablet Take 2.5 mg by mouth daily.     . methocarbamol (ROBAXIN) 500 MG tablet Take 500 mg by mouth every 8 (eight) hours as needed for muscle spasms.    . Multiple Vitamins-Minerals (MULTIVITAMIN PO) Take 1 tablet by mouth daily. Centrum Silver    . naproxen sodium (ALEVE) 220 MG tablet Take 220 mg by mouth 2 (two) times daily.    Marland Kitchen omeprazole (PRILOSEC) 20 MG capsule Take 20 mg by mouth daily.     Marland Kitchen oxyCODONE-acetaminophen (ROXICET) 5-325 MG tablet Take 1-2 tablets by mouth every 4 (four) hours as needed. 60 tablet 0  . rivaroxaban (XARELTO) 20 MG TABS tablet 67m BID x 3 weeks, then  287mQd for 3 MONTHS 133 tablet 0  . simvastatin (ZOCOR) 40 MG tablet Take 40 mg by mouth at bedtime.     . Marland Kitchenetrahydrozoline-zinc (VISINE-AC) 0.05-0.25 % ophthalmic solution Place 2 drops into both eyes 3 (three) times daily as needed (dry eyes).    . traMADol (ULTRAM) 50 MG tablet Take 50 mg by mouth every 6 (six) hours as needed.  No current facility-administered medications for this visit.     Functional Status:  In your present state of health, do you have any difficulty performing the following activities: 10/08/2017 09/24/2017  Hearing? Y Y  Comment - -  Vision? Y Y  Difficulty concentrating or making decisions? N N  Walking or climbing stairs? Y Y  Dressing or bathing? Y N  Doing errands, shopping? Y N  Some recent data might be hidden    Fall/Depression Screening: Fall Risk  10/08/2017  Falls in the past year? No  Risk for fall due to : Impaired mobility   No flowsheet data found.  Assessment:  Patient progressing well.  Has attended MD appt 01/23. Pain much decreased & is controlled  with tylenol. Case appropriate for closure.  Patient voices no further health concerns. Care plan goals met and updated as noted.   [facility] CM Care Plan Problem One     Most Recent Value  Care Plan Problem One  Recent development of DVT as evidence by doppler results post op hip replacement   Role Documenting the Problem One  Care Management Telephonic Coordinator  Care Plan for Problem One  Active  [facility] Long Term Goal   Patient/caregiver  will consistently take blood thinner medication as ordered by MD within 14 days of  start of medication  [facility] Long Term Goal Start Date  10/08/17  [facility] Long Term Goal Met Date  10/15/17  Interventions for Problem One Long Term Goal  Explain importance of taking medication (xarelo) as ordered by MD, attendance to MD appointments as set, early recognition of reportables symptoms to report to MD  [facility] CM Short Term Goal #1   pt/caregiver will report within 14 days reportable signs of complications of DVT  [facility] CM Short Term Goal #1 Start Date  10/08/17  [facility] CM Short Term Goal #1 Met Date  10/15/17  Interventions for Short Term Goal #1  Explain sxs of complications of DVT that are reportable to MD, chest pain, shortness of breath, rapid pulse, coughing up blood, faintness     Plan:  Send MD closure letter. Send to care management assistant for case closure.  Sherrin Daisy, RN BSN [place] Management Coordinator [facility] Care Management  [phone]

## 2017-10-18 ENCOUNTER — Telehealth (INDEPENDENT_AMBULATORY_CARE_PROVIDER_SITE_OTHER): Payer: Self-pay | Admitting: Orthopaedic Surgery

## 2017-10-18 DIAGNOSIS — Z7901 Long term (current) use of anticoagulants: Secondary | ICD-10-CM | POA: Diagnosis not present

## 2017-10-18 DIAGNOSIS — I700 Atherosclerosis of aorta: Secondary | ICD-10-CM | POA: Diagnosis not present

## 2017-10-18 DIAGNOSIS — Z794 Long term (current) use of insulin: Secondary | ICD-10-CM | POA: Diagnosis not present

## 2017-10-18 DIAGNOSIS — Z96642 Presence of left artificial hip joint: Secondary | ICD-10-CM | POA: Diagnosis not present

## 2017-10-18 DIAGNOSIS — E1140 Type 2 diabetes mellitus with diabetic neuropathy, unspecified: Secondary | ICD-10-CM | POA: Diagnosis not present

## 2017-10-18 DIAGNOSIS — I350 Nonrheumatic aortic (valve) stenosis: Secondary | ICD-10-CM | POA: Diagnosis not present

## 2017-10-18 DIAGNOSIS — Z471 Aftercare following joint replacement surgery: Secondary | ICD-10-CM | POA: Diagnosis not present

## 2017-10-18 NOTE — Telephone Encounter (Signed)
Radavon-(OT) with [facility] called left voicemail needing verbal orders for 1 wk 3 including this week. He advised started eval today. The number to contact Radavon is [phone]

## 2017-10-19 ENCOUNTER — Encounter: Payer: Self-pay | Admitting: *Deleted

## 2017-10-19 NOTE — Telephone Encounter (Signed)
Verbal orders given  

## 2017-10-19 NOTE — Telephone Encounter (Signed)
That sounds fine

## 2017-10-19 NOTE — Telephone Encounter (Signed)
Please advise 

## 2017-10-20 ENCOUNTER — Ambulatory Visit (INDEPENDENT_AMBULATORY_CARE_PROVIDER_SITE_OTHER): Payer: Medicare HMO | Admitting: Physician Assistant

## 2017-10-20 ENCOUNTER — Encounter (INDEPENDENT_AMBULATORY_CARE_PROVIDER_SITE_OTHER): Payer: Self-pay | Admitting: Physician Assistant

## 2017-10-20 DIAGNOSIS — E1140 Type 2 diabetes mellitus with diabetic neuropathy, unspecified: Secondary | ICD-10-CM | POA: Diagnosis not present

## 2017-10-20 DIAGNOSIS — I350 Nonrheumatic aortic (valve) stenosis: Secondary | ICD-10-CM | POA: Diagnosis not present

## 2017-10-20 DIAGNOSIS — Z794 Long term (current) use of insulin: Secondary | ICD-10-CM | POA: Diagnosis not present

## 2017-10-20 DIAGNOSIS — I700 Atherosclerosis of aorta: Secondary | ICD-10-CM | POA: Diagnosis not present

## 2017-10-20 DIAGNOSIS — Z7901 Long term (current) use of anticoagulants: Secondary | ICD-10-CM | POA: Diagnosis not present

## 2017-10-20 DIAGNOSIS — Z96642 Presence of left artificial hip joint: Secondary | ICD-10-CM | POA: Diagnosis not present

## 2017-10-20 DIAGNOSIS — Z471 Aftercare following joint replacement surgery: Secondary | ICD-10-CM | POA: Diagnosis not present

## 2017-10-20 MED ORDER — DOXYCYCLINE HYCLATE 100 MG PO TABS: 100.0000 mg | ORAL_TABLET | Freq: Two times a day (BID) | ORAL | 0 refills | Status: DC

## 2017-10-20 NOTE — Progress Notes (Signed)
[place] [name] returns today follow-up of his left hip status post left total hip arthroplasty.  He is here mainly for just wound check.  He feels he is progressing well with physical therapy.  He has began walking with a cane and at home.  He is washing the wound with an antibacterial soap and applying Bactroban.  He is been on doxycycline finished a 7-day course today.  Said no fevers chills shortness of breath or chest pain.  He is on Xarelto for postoperative DVT.  Physical exam: Left hip he has good range of motion left hip without pain.  Has minimal erythema about the incision site.  Remaining erythema has resolved.  There is no expressible purulence.  No dehiscence of the wound.  Impression: Status post left total hip arthroplasty 09/24/2017  Postoperative DVT on Xarelto  Plan: We will continue him on doxycycline for another week.  He will continue his daily wound care.  Follow-up with [place] in 1 week to check his progress lack of.  Questions encouraged and answered.

## 2017-10-21 DIAGNOSIS — I350 Nonrheumatic aortic (valve) stenosis: Secondary | ICD-10-CM | POA: Diagnosis not present

## 2017-10-21 DIAGNOSIS — Z7901 Long term (current) use of anticoagulants: Secondary | ICD-10-CM | POA: Diagnosis not present

## 2017-10-21 DIAGNOSIS — I700 Atherosclerosis of aorta: Secondary | ICD-10-CM | POA: Diagnosis not present

## 2017-10-21 DIAGNOSIS — Z96642 Presence of left artificial hip joint: Secondary | ICD-10-CM | POA: Diagnosis not present

## 2017-10-21 DIAGNOSIS — Z794 Long term (current) use of insulin: Secondary | ICD-10-CM | POA: Diagnosis not present

## 2017-10-21 DIAGNOSIS — E1140 Type 2 diabetes mellitus with diabetic neuropathy, unspecified: Secondary | ICD-10-CM | POA: Diagnosis not present

## 2017-10-21 DIAGNOSIS — Z471 Aftercare following joint replacement surgery: Secondary | ICD-10-CM | POA: Diagnosis not present

## 2017-10-22 DIAGNOSIS — Z794 Long term (current) use of insulin: Secondary | ICD-10-CM | POA: Diagnosis not present

## 2017-10-22 DIAGNOSIS — E1140 Type 2 diabetes mellitus with diabetic neuropathy, unspecified: Secondary | ICD-10-CM | POA: Diagnosis not present

## 2017-10-22 DIAGNOSIS — Z7901 Long term (current) use of anticoagulants: Secondary | ICD-10-CM | POA: Diagnosis not present

## 2017-10-22 DIAGNOSIS — Z471 Aftercare following joint replacement surgery: Secondary | ICD-10-CM | POA: Diagnosis not present

## 2017-10-22 DIAGNOSIS — I350 Nonrheumatic aortic (valve) stenosis: Secondary | ICD-10-CM | POA: Diagnosis not present

## 2017-10-22 DIAGNOSIS — Z96642 Presence of left artificial hip joint: Secondary | ICD-10-CM | POA: Diagnosis not present

## 2017-10-22 DIAGNOSIS — I700 Atherosclerosis of aorta: Secondary | ICD-10-CM | POA: Diagnosis not present

## 2017-10-25 DIAGNOSIS — E1140 Type 2 diabetes mellitus with diabetic neuropathy, unspecified: Secondary | ICD-10-CM | POA: Diagnosis not present

## 2017-10-25 DIAGNOSIS — Z471 Aftercare following joint replacement surgery: Secondary | ICD-10-CM | POA: Diagnosis not present

## 2017-10-25 DIAGNOSIS — I700 Atherosclerosis of aorta: Secondary | ICD-10-CM | POA: Diagnosis not present

## 2017-10-25 DIAGNOSIS — I350 Nonrheumatic aortic (valve) stenosis: Secondary | ICD-10-CM | POA: Diagnosis not present

## 2017-10-25 DIAGNOSIS — Z7901 Long term (current) use of anticoagulants: Secondary | ICD-10-CM | POA: Diagnosis not present

## 2017-10-25 DIAGNOSIS — Z96642 Presence of left artificial hip joint: Secondary | ICD-10-CM | POA: Diagnosis not present

## 2017-10-25 DIAGNOSIS — Z794 Long term (current) use of insulin: Secondary | ICD-10-CM | POA: Diagnosis not present

## 2017-10-27 ENCOUNTER — Encounter (INDEPENDENT_AMBULATORY_CARE_PROVIDER_SITE_OTHER): Payer: Self-pay | Admitting: Physician Assistant

## 2017-10-27 ENCOUNTER — Ambulatory Visit (INDEPENDENT_AMBULATORY_CARE_PROVIDER_SITE_OTHER): Payer: Medicare HMO | Admitting: Physician Assistant

## 2017-10-27 DIAGNOSIS — Z96642 Presence of left artificial hip joint: Secondary | ICD-10-CM

## 2017-10-27 NOTE — Progress Notes (Signed)
Mr. [place] [name] returns today follow-up of his left hip incision.  He is now 33 days status post left total hip arthroplasty.  He is finished his doxycycline.  He is washing the incision with soap and applying a small amount of Bactroban over the incision.  Denies any fevers chills.  Overall doing well.  He is walking with a cane outside the house and does not use a cane inside the home.   Physical exam: General well-developed well-nourished male in no acute distress Left hip: Surgical incisions healing well.  Slight erythema and scab over the incision site.  No drainage.  No evidence of seroma.  Good range of motion of the hip without significant pain.  Ambulates without an assistive device in the room with just a slight antalgic gait.  Impression: Status post left total hip arthroplasty  Postoperative DVT  Plan: He will continue the current care with an antibacterial soap to wash the wound with daily apply a small amount of Bactroban daily.  He is to remain on Xarelto now transitioning to 20 mg once daily he will be on this for approximately 3 months.  We will see him back in 2 weeks check his incision sooner if there is any concerns or signs of infection.

## 2017-10-28 DIAGNOSIS — H52203 Unspecified astigmatism, bilateral: Secondary | ICD-10-CM | POA: Diagnosis not present

## 2017-10-28 DIAGNOSIS — H04123 Dry eye syndrome of bilateral lacrimal glands: Secondary | ICD-10-CM | POA: Diagnosis not present

## 2017-10-28 DIAGNOSIS — E113313 Type 2 diabetes mellitus with moderate nonproliferative diabetic retinopathy with macular edema, bilateral: Secondary | ICD-10-CM | POA: Diagnosis not present

## 2017-10-28 DIAGNOSIS — H25812 Combined forms of age-related cataract, left eye: Secondary | ICD-10-CM | POA: Diagnosis not present

## 2017-11-01 ENCOUNTER — Telehealth (INDEPENDENT_AMBULATORY_CARE_PROVIDER_SITE_OTHER): Payer: Self-pay | Admitting: Orthopaedic Surgery

## 2017-11-01 DIAGNOSIS — Z471 Aftercare following joint replacement surgery: Secondary | ICD-10-CM | POA: Diagnosis not present

## 2017-11-01 DIAGNOSIS — Z96642 Presence of left artificial hip joint: Secondary | ICD-10-CM | POA: Diagnosis not present

## 2017-11-01 DIAGNOSIS — I350 Nonrheumatic aortic (valve) stenosis: Secondary | ICD-10-CM | POA: Diagnosis not present

## 2017-11-01 DIAGNOSIS — E1140 Type 2 diabetes mellitus with diabetic neuropathy, unspecified: Secondary | ICD-10-CM | POA: Diagnosis not present

## 2017-11-01 DIAGNOSIS — Z794 Long term (current) use of insulin: Secondary | ICD-10-CM | POA: Diagnosis not present

## 2017-11-01 DIAGNOSIS — Z7901 Long term (current) use of anticoagulants: Secondary | ICD-10-CM | POA: Diagnosis not present

## 2017-11-01 DIAGNOSIS — I700 Atherosclerosis of aorta: Secondary | ICD-10-CM | POA: Diagnosis not present

## 2017-11-01 NOTE — Telephone Encounter (Signed)
FYI

## 2017-11-01 NOTE — Telephone Encounter (Signed)
Radovan-(OT) called  Advised patient is being discharged from OT today. Patient has met all of his goals. Patient will be discharged. (PT is going 1 more week or so. The number to contact Radovan is [phone]

## 2017-11-02 DIAGNOSIS — Z794 Long term (current) use of insulin: Secondary | ICD-10-CM | POA: Diagnosis not present

## 2017-11-02 DIAGNOSIS — E1140 Type 2 diabetes mellitus with diabetic neuropathy, unspecified: Secondary | ICD-10-CM | POA: Diagnosis not present

## 2017-11-02 DIAGNOSIS — Z7901 Long term (current) use of anticoagulants: Secondary | ICD-10-CM | POA: Diagnosis not present

## 2017-11-02 DIAGNOSIS — I350 Nonrheumatic aortic (valve) stenosis: Secondary | ICD-10-CM | POA: Diagnosis not present

## 2017-11-02 DIAGNOSIS — Z96642 Presence of left artificial hip joint: Secondary | ICD-10-CM | POA: Diagnosis not present

## 2017-11-02 DIAGNOSIS — I700 Atherosclerosis of aorta: Secondary | ICD-10-CM | POA: Diagnosis not present

## 2017-11-02 DIAGNOSIS — Z471 Aftercare following joint replacement surgery: Secondary | ICD-10-CM | POA: Diagnosis not present

## 2017-11-03 ENCOUNTER — Encounter (INDEPENDENT_AMBULATORY_CARE_PROVIDER_SITE_OTHER): Payer: Self-pay | Admitting: Orthopaedic Surgery

## 2017-11-03 ENCOUNTER — Ambulatory Visit (INDEPENDENT_AMBULATORY_CARE_PROVIDER_SITE_OTHER): Payer: Medicare HMO | Admitting: Orthopaedic Surgery

## 2017-11-03 DIAGNOSIS — Z96642 Presence of left artificial hip joint: Secondary | ICD-10-CM

## 2017-11-03 NOTE — Progress Notes (Signed)
The patient is now 5 weeks status post a left total hip arthroplasty.  We have been dealing with a postoperative seroma and some drainage.  Is been using Bactroban cream he said the incision is doing much better and he is doing better overall.  He is 84 years old is not able walk with assistive device.  On examination of his left hip incision looks good overall.  I removed the scab and placed a Steri-Strip just distally but there is no significant seroma.  There is no evidence of infection.  He will continue increase his activities.  I would like to see him back in a month to see how his hip is doing overall and to look his incision but no x-rays are needed.  Obviously if anything happens between now and then or worsening any way he will let us know.

## 2017-11-10 ENCOUNTER — Ambulatory Visit (INDEPENDENT_AMBULATORY_CARE_PROVIDER_SITE_OTHER): Payer: Medicare HMO | Admitting: Physician Assistant

## 2017-11-12 DIAGNOSIS — Z96642 Presence of left artificial hip joint: Secondary | ICD-10-CM | POA: Diagnosis not present

## 2017-11-12 DIAGNOSIS — I350 Nonrheumatic aortic (valve) stenosis: Secondary | ICD-10-CM | POA: Diagnosis not present

## 2017-11-12 DIAGNOSIS — K210 Gastro-esophageal reflux disease with esophagitis: Secondary | ICD-10-CM | POA: Diagnosis not present

## 2017-11-12 DIAGNOSIS — N183 Chronic kidney disease, stage 3 (moderate): Secondary | ICD-10-CM | POA: Diagnosis not present

## 2017-11-12 DIAGNOSIS — I82409 Acute embolism and thrombosis of unspecified deep veins of unspecified lower extremity: Secondary | ICD-10-CM | POA: Diagnosis not present

## 2017-11-12 DIAGNOSIS — E11319 Type 2 diabetes mellitus with unspecified diabetic retinopathy without macular edema: Secondary | ICD-10-CM | POA: Diagnosis not present

## 2017-11-12 DIAGNOSIS — M1990 Unspecified osteoarthritis, unspecified site: Secondary | ICD-10-CM | POA: Diagnosis not present

## 2017-11-12 DIAGNOSIS — Z794 Long term (current) use of insulin: Secondary | ICD-10-CM | POA: Diagnosis not present

## 2017-11-12 DIAGNOSIS — E1140 Type 2 diabetes mellitus with diabetic neuropathy, unspecified: Secondary | ICD-10-CM | POA: Diagnosis not present

## 2017-11-16 DIAGNOSIS — H21562 Pupillary abnormality, left eye: Secondary | ICD-10-CM | POA: Diagnosis not present

## 2017-11-16 DIAGNOSIS — H25812 Combined forms of age-related cataract, left eye: Secondary | ICD-10-CM | POA: Diagnosis not present

## 2017-11-16 DIAGNOSIS — H268 Other specified cataract: Secondary | ICD-10-CM | POA: Diagnosis not present

## 2017-11-16 DIAGNOSIS — H3581 Retinal edema: Secondary | ICD-10-CM | POA: Diagnosis not present

## 2017-11-24 DIAGNOSIS — E113313 Type 2 diabetes mellitus with moderate nonproliferative diabetic retinopathy with macular edema, bilateral: Secondary | ICD-10-CM | POA: Diagnosis not present

## 2017-11-24 DIAGNOSIS — H353111 Nonexudative age-related macular degeneration, right eye, early dry stage: Secondary | ICD-10-CM | POA: Diagnosis not present

## 2017-11-24 DIAGNOSIS — H43813 Vitreous degeneration, bilateral: Secondary | ICD-10-CM | POA: Diagnosis not present

## 2017-12-01 ENCOUNTER — Encounter (INDEPENDENT_AMBULATORY_CARE_PROVIDER_SITE_OTHER): Payer: Self-pay | Admitting: Orthopaedic Surgery

## 2017-12-01 ENCOUNTER — Ambulatory Visit (INDEPENDENT_AMBULATORY_CARE_PROVIDER_SITE_OTHER): Payer: Medicare HMO | Admitting: Orthopaedic Surgery

## 2017-12-01 DIAGNOSIS — Z96642 Presence of left artificial hip joint: Secondary | ICD-10-CM

## 2017-12-01 NOTE — Progress Notes (Signed)
The patient is about 9-10 weeks status post a left total hip arthroplasty.  He is 84 years old.  He says the hip is doing excellent.  He does feel he has painful arthritis in his left knee but right now he is waiting for warm weather to get him feeling better.  He is not interested in any intervention with that knee.  As far as the hip goes though he is very pleased with.  He crosses his left leg over his right easily.  I can put his hip through internal extra rotation on the left side without any issues.  He is got pretty good flexion-extension of his left knee but it is painful to him.  He lacks full extension by a few degrees.  At this point we will see him back in 6 months to see how is doing overall and we can get a low AP pelvis at that visit.  Obviously we can always address his knee if it comes an issue to him and he understands this as well.

## 2017-12-13 ENCOUNTER — Other Ambulatory Visit: Payer: Self-pay

## 2017-12-13 ENCOUNTER — Encounter: Payer: Self-pay | Admitting: Physician Assistant

## 2017-12-13 ENCOUNTER — Ambulatory Visit (HOSPITAL_COMMUNITY): Payer: Medicare HMO | Attending: Cardiovascular Disease

## 2017-12-13 DIAGNOSIS — I77810 Thoracic aortic ectasia: Secondary | ICD-10-CM | POA: Insufficient documentation

## 2017-12-13 DIAGNOSIS — R001 Bradycardia, unspecified: Secondary | ICD-10-CM | POA: Diagnosis not present

## 2017-12-13 DIAGNOSIS — I350 Nonrheumatic aortic (valve) stenosis: Secondary | ICD-10-CM | POA: Diagnosis not present

## 2017-12-13 DIAGNOSIS — R0600 Dyspnea, unspecified: Secondary | ICD-10-CM | POA: Diagnosis not present

## 2017-12-13 DIAGNOSIS — Z87891 Personal history of nicotine dependence: Secondary | ICD-10-CM | POA: Insufficient documentation

## 2017-12-13 DIAGNOSIS — I080 Rheumatic disorders of both mitral and aortic valves: Secondary | ICD-10-CM | POA: Diagnosis not present

## 2017-12-14 ENCOUNTER — Telehealth: Payer: Self-pay | Admitting: *Deleted

## 2017-12-14 NOTE — Telephone Encounter (Signed)
DPR for pt's wife who has been notified of echo results for the pt. Pt has upcoming appt with Tereso Newcomer, PA 3/29, I advised to keep the appt. Pt's wife is agreeable to plan of care and thanked me for the call today. I will forward a copy of results to PCP Dr. Kevan Ny.

## 2017-12-14 NOTE — Telephone Encounter (Signed)
-----   Message from Beatrice Lecher, [place] sent at 12/13/2017  8:54 PM EDT ----- Please call the patient. The echocardiogram shows normal heart function, some mild stiffness (diastolic dysfunction) which is related to HTN and age and mild stiffness of the aortic valve.  Findings are not significantly changed from the echocardiogram done in 2018.   Continue current medications and follow up as planned.  Please fax a copy of this study result to his PCP:  Marden Noble, MD  Thanks! Tereso Newcomer, PA-C    12/13/2017 8:49 PM

## 2017-12-17 ENCOUNTER — Encounter: Payer: Self-pay | Admitting: Physician Assistant

## 2017-12-17 ENCOUNTER — Ambulatory Visit: Payer: Medicare HMO | Admitting: Physician Assistant

## 2017-12-17 VITALS — BP 150/40 | HR 51 | Ht 67.5 in | Wt 180.2 lb

## 2017-12-17 DIAGNOSIS — I440 Atrioventricular block, first degree: Secondary | ICD-10-CM

## 2017-12-17 DIAGNOSIS — E119 Type 2 diabetes mellitus without complications: Secondary | ICD-10-CM | POA: Diagnosis not present

## 2017-12-17 DIAGNOSIS — I350 Nonrheumatic aortic (valve) stenosis: Secondary | ICD-10-CM | POA: Diagnosis not present

## 2017-12-17 DIAGNOSIS — I10 Essential (primary) hypertension: Secondary | ICD-10-CM

## 2017-12-17 DIAGNOSIS — Z86718 Personal history of other venous thrombosis and embolism: Secondary | ICD-10-CM | POA: Diagnosis not present

## 2017-12-17 DIAGNOSIS — Z794 Long term (current) use of insulin: Secondary | ICD-10-CM

## 2017-12-17 DIAGNOSIS — I700 Atherosclerosis of aorta: Secondary | ICD-10-CM

## 2017-12-17 NOTE — Patient Instructions (Signed)
Medication Instructions:  1. Your physician recommends that you continue on your current medications as directed. Please refer to the Current Medication list given to you today.   Labwork: NONE ORDERED TODAY  Testing/Procedures: 1. Your physician has requested that you have an echocardiogram. Echocardiography is a painless test that uses sound waves to create images of your heart. It provides your doctor with information about the size and shape of your heart and how well your heart's chambers and valves are working. This procedure takes approximately one hour. There are no restrictions for this procedure. THIS IS TO BE DONE 1 WEEK BEFORE APPT WITH SCOTT WEAVER, PAC IN 1 YEAR    Follow-Up: Your physician wants you to follow-up in: 1 YEAR WITH SCOTT WEAVER, [facility] You will receive a reminder letter in the mail two months in advance. If you don't receive a letter, please call our office to schedule the follow-up appointment.   Any Other Special Instructions Will Be Listed Below (If Applicable).     If you need a refill on your cardiac medications before your next appointment, please call your pharmacy.

## 2017-12-17 NOTE — Progress Notes (Signed)
Cardiology Office Note:    Date:  12/17/2017   ID:  Jackson [name], DOB 1928/11/30, MRN [id number]  PCP:  Jackson Huddle, MD  Cardiologist:  Virl Axe, MD / Richardson Dopp, PA-C   Referring MD: Jackson Huddle, MD   Chief Complaint  Patient presents with  . Follow-up    Aortic stenosis, bradycardia    History of Present Illness:    Jackson [name] is a 84 y.o. male with mild aortic stenosis, first-degree AV block, aortic atherosclerosis, diabetes with peripheral neuropathy, hypertension, pulmonary hypertension.  During an admission in June 2016 for pneumonia and sepsis, he was noted to have bradycardia with transient Mobitz 1 and episodes of 2:1 AV block.  Bradycardia was noted to be during sleep.  He was evaluated by Dr. Caryl Comes.  There were no indications for pacemaker.  He was last seen in March 2018.  He had left total hip replacement in January 2019.  Postoperatively, he developed a DVT and was placed on Rivaroxaban.     Jackson [name] returns for follow-up.  He is here today with his wife who is followed by Dr. Irish Lack.  The patient denies any chest discomfort or significant shortness of breath.  He did try to walk [place] distance at the [place] this [place] weekend with his walker.  He did get out of breath towards the end of the walk.  He denies PND, significant edema.  He denies syncope, near syncope or dizziness.  He did lose his balance once after standing up quickly.  Prior CV studies:   The following studies were reviewed today:  Echo 12/13/17 Mild LVH, EF 60-65, normal wall motion, grade 1 diastolic dysfunction, mild aortic stenosis (mean 12, peak 20), mildly dilated aortic root and ascending aorta (root 39, ascending aorta 38), MAC, trivial MR, normal RVSF  Echo 12/15/16 Mild LVH, EF 60-65, normal wall motion, grade 1 diastolic dysfunction, mild aortic stenosis (mean 12/peak 22), trivial MR, normal RVSF, PASP 47 (mild pulmonary hypertension)  Echo 02/26/15 Moderate LVH, EF 65-70,  normal wall motion, mild MR, mild LAE   Past Medical History:  Diagnosis Date  . Aortic stenosis    Mild LVH, EF 60-65, normal wall motion, grade 1 diastolic dysfunction, mild aortic stenosis (mean 12/peak 22), trivial MR, normal RVSF, PASP 47 (mild pulmonary hypertension) // Echo 3/19: mild LVH, EF 60-65, no RWMA, Gr 1 DD, mild AS (mean 12, peak 20), Ao root mildly dilated (39 mm), Asc Aorta 38 mm, MAC, trivial MR   . Atherosclerosis of aorta ([place]) 12/16/2016   CXR 6/16: IMPRESSION: 1. Resolved left basilar airspace opacities. 2. Hazy nodularity in both lungs compatible with the patient's known pleural plaques. 3. Right anterior hemidiaphragmatic eventration. 4. Thoracic spondylosis. 5. Atherosclerotic calcification of the aortic arch.  . Bradycardia    Low HR (30s) noted during sleep during admx for sepsis in 2016; Mobitz 1, 2:1 block >> no indication for pacer; management of OSA recommended   . Diabetes mellitus without complication ([place])   . Diabetic neuropathy ([place])   . DJD (degenerative joint disease)    knee, hip  . GERD (gastroesophageal reflux disease)   . Heart murmur   . History of echocardiogram    a. Echo 6/16: mod LVH, EF 65-70, vigorous LVF, no RWMA, mild MR, mild LAE  . HLD (hyperlipidemia)   . Hypertension   . Hyperthyroidism   . Phlebitis of left leg   . Pneumonia 02/2015   admx with sepsis  Surgical Hx: The patient  has a past surgical history that includes Circumcision; Cataract extraction (Right); Total hip arthroplasty (Left, 09/24/2017); and Fracture surgery.   Current Medications: Current Meds  Medication Sig  . ACCU-CHEK AVIVA PLUS test strip   . Alcohol Swabs (B-D SINGLE USE SWABS REGULAR) PADS   . Calcium Carb-Cholecalciferol (CALCIUM CARBONATE-VITAMIN D3 PO) Take by mouth 1 day or 1 dose.  . Calcium Carbonate-Vitamin D (CALCIUM 600+D PO) Take 1 tablet by mouth daily.  Marland Kitchen gabapentin (NEURONTIN) 100 MG capsule Take 100 mg by mouth 3 (three) times daily.   .  insulin aspart (NOVOLOG FLEXPEN) 100 UNIT/ML FlexPen Before each meal 3 times a day, 140-199 - 2 units, 200-250 - 4 units, 251-299 - 6 units,  300-349 - 8 units,  350 or above 10 units.  . Insulin Glargine (LANTUS) 100 UNIT/ML Solostar Pen Inject 15 Units into the skin daily at 10 pm.  . lisinopril (PRINIVIL,ZESTRIL) 2.5 MG tablet Take 2.5 mg by mouth daily.   . methocarbamol (ROBAXIN) 500 MG tablet Take 500 mg by mouth every 8 (eight) hours as needed for muscle spasms.  . Multiple Vitamins-Minerals (MULTIVITAMIN PO) Take 1 tablet by mouth daily. Centrum Silver  . omeprazole (PRILOSEC) 20 MG capsule Take 20 mg by mouth daily.   . rivaroxaban (XARELTO) 20 MG TABS tablet 24m BID x 3 weeks, then  257mQd for 3 MONTHS  . simvastatin (ZOCOR) 40 MG tablet Take 40 mg by mouth at bedtime.   . Marland Kitchenetrahydrozoline-zinc (VISINE-AC) 0.05-0.25 % ophthalmic solution Place 2 drops into both eyes 3 (three) times daily as needed (dry eyes).  . traMADol (ULTRAM) 50 MG tablet Take 50 mg by mouth every 6 (six) hours as needed for severe pain.      Allergies:   Actos [pioglitazone] and Formaldehyde   Social History   Tobacco Use  . Smoking status: Former Smoker    Last attempt to quit: 1960    Years since quitting: 59.2  . Smokeless tobacco: Never Used  Substance Use Topics  . Alcohol use: No  . Drug use: No     Family Hx: The patient's family history includes Diabetes in his brother, mother, and sister. There is no history of Heart attack, Stroke, or Hypertension.  ROS:   Please see the history of present illness.    ROS All other systems reviewed and are negative.   EKGs/Labs/Other Test Reviewed:    EKG:  EKG is  ordered today.  The ekg ordered today demonstrates sinus bradycardia, HR 51, left axis deviation, inferior and anterior Q waves, first-degree AV block, PR 260, QTC 363, similar to prior tracings  Recent Labs: 09/25/2017: BUN 19; Creatinine, Ser 1.19; Hemoglobin 13.3; Platelets 247;  Potassium 4.7; Sodium 131   Recent Lipid Panel No results found for: CHOL, TRIG, HDL, CHOLHDL, LDLCALC, LDLDIRECT  Physical Exam:    VS:  BP (!) 150/40   Pulse (!) 51   Ht 5' 7.5" (1.715 m)   Wt 180 lb 3.2 oz (81.7 kg)   SpO2 94%   BMI 27.81 kg/m     Wt Readings from Last 3 Encounters:  12/17/17 180 lb 3.2 oz (81.7 kg)  09/24/17 180 lb (81.6 kg)  09/17/17 180 lb (81.6 kg)     Physical Exam  Constitutional: He is oriented to person, place, and time. He appears well-developed and well-nourished. No distress.  HENT:  Head: Normocephalic and atraumatic.  Neck: Neck supple.  Cardiovascular:  Murmur heard.  Harsh systolic  murmur is present with a grade of 1/6 at the lower left sternal border. Pulmonary/Chest: Effort normal. He has no rales.  Abdominal: Soft.  Musculoskeletal: He exhibits no edema.  Neurological: He is alert and oriented to person, place, and time.  Skin: Skin is warm and dry.    ASSESSMENT & PLAN:    #1.  Aortic valve stenosis, etiology of cardiac valve disease unspecified Mild by recent echocardiogram.  Aortic root was mildly dilated.  Plan follow-up echocardiogram in 1 year.  #2.  Atherosclerosis of aorta (HCC) Continue statin.  Once he is taken off of rivaroxaban, resume aspirin 81 mg daily.  #3.  Type 2 diabetes mellitus without complication, with long-term current use of insulin ([place]) Continue follow-up with primary care.  #4.  First degree AV block No significant change.  Avoid AV nodal blocking agents.  We discussed symptoms to prompt earlier follow-up with cardiology.  #5.  Essential hypertension Blood pressure usually well controlled.  Blood pressure is somewhat elevated today.  Continue to monitor.  #6.  History of DVT (deep vein thrombosis) Managed by orthopedic surgery.  He is currently on rivaroxaban.   Dispo:  Return in about 1 year (around 12/18/2018) for Routine Follow Up, w/ Richardson Dopp, PA-C.   Medication Adjustments/Labs and  Tests Ordered: Current medicines are reviewed at length with the patient today.  Concerns regarding medicines are outlined above.  Tests Ordered: Orders Placed This Encounter  Procedures  . EKG 12-Lead  . ECHOCARDIOGRAM COMPLETE   Medication Changes: No orders of the defined types were placed in this encounter.   Signed, Richardson Dopp, PA-C  12/17/2017 1:44 PM    [facility] [address], [place], [place]  [address] Phone: [phone]; Fax: [phone]

## 2017-12-22 DIAGNOSIS — E1140 Type 2 diabetes mellitus with diabetic neuropathy, unspecified: Secondary | ICD-10-CM | POA: Diagnosis not present

## 2017-12-22 DIAGNOSIS — E113313 Type 2 diabetes mellitus with moderate nonproliferative diabetic retinopathy with macular edema, bilateral: Secondary | ICD-10-CM | POA: Diagnosis not present

## 2017-12-22 DIAGNOSIS — S20219A Contusion of unspecified front wall of thorax, initial encounter: Secondary | ICD-10-CM | POA: Diagnosis not present

## 2017-12-22 DIAGNOSIS — Z794 Long term (current) use of insulin: Secondary | ICD-10-CM | POA: Diagnosis not present

## 2018-01-26 DIAGNOSIS — J209 Acute bronchitis, unspecified: Secondary | ICD-10-CM | POA: Diagnosis not present

## 2018-01-31 ENCOUNTER — Telehealth (INDEPENDENT_AMBULATORY_CARE_PROVIDER_SITE_OTHER): Payer: Self-pay | Admitting: Orthopaedic Surgery

## 2018-01-31 NOTE — Telephone Encounter (Signed)
See below

## 2018-01-31 NOTE — Telephone Encounter (Signed)
He does not need to be on Xarelto from my standpoint anymore.  I am fine with just a baby aspirin daily.

## 2018-01-31 NOTE — Telephone Encounter (Signed)
Patients wife walked in to see if patient could go off of xarelto, it has jumped in price almost triple. She wanted to see if he could go on just baby aspirin or plavix? Please advise  # [phone]

## 2018-02-01 NOTE — Telephone Encounter (Signed)
Patient aware of the below message  

## 2018-03-27 ENCOUNTER — Emergency Department (HOSPITAL_COMMUNITY): Payer: Medicare HMO

## 2018-03-27 ENCOUNTER — Other Ambulatory Visit: Payer: Self-pay

## 2018-03-27 ENCOUNTER — Encounter (HOSPITAL_COMMUNITY): Payer: Self-pay | Admitting: Emergency Medicine

## 2018-03-27 ENCOUNTER — Inpatient Hospital Stay (HOSPITAL_COMMUNITY)
Admission: EM | Admit: 2018-03-27 | Discharge: 2018-03-30 | DRG: 871 | Disposition: A | Discharge status: Home Health | Payer: Medicare HMO | Attending: Family Medicine | Admitting: Family Medicine

## 2018-03-27 DIAGNOSIS — Z79899 Other long term (current) drug therapy: Secondary | ICD-10-CM | POA: Diagnosis not present

## 2018-03-27 DIAGNOSIS — E1140 Type 2 diabetes mellitus with diabetic neuropathy, unspecified: Secondary | ICD-10-CM | POA: Diagnosis not present

## 2018-03-27 DIAGNOSIS — Z9841 Cataract extraction status, right eye: Secondary | ICD-10-CM | POA: Diagnosis not present

## 2018-03-27 DIAGNOSIS — J181 Lobar pneumonia, unspecified organism: Secondary | ICD-10-CM | POA: Diagnosis not present

## 2018-03-27 DIAGNOSIS — E119 Type 2 diabetes mellitus without complications: Secondary | ICD-10-CM

## 2018-03-27 DIAGNOSIS — Z86718 Personal history of other venous thrombosis and embolism: Secondary | ICD-10-CM | POA: Diagnosis not present

## 2018-03-27 DIAGNOSIS — E1165 Type 2 diabetes mellitus with hyperglycemia: Secondary | ICD-10-CM | POA: Diagnosis present

## 2018-03-27 DIAGNOSIS — A419 Sepsis, unspecified organism: Secondary | ICD-10-CM | POA: Diagnosis not present

## 2018-03-27 DIAGNOSIS — J189 Pneumonia, unspecified organism: Secondary | ICD-10-CM | POA: Diagnosis not present

## 2018-03-27 DIAGNOSIS — Z87891 Personal history of nicotine dependence: Secondary | ICD-10-CM

## 2018-03-27 DIAGNOSIS — J9621 Acute and chronic respiratory failure with hypoxia: Secondary | ICD-10-CM | POA: Diagnosis present

## 2018-03-27 DIAGNOSIS — R9431 Abnormal electrocardiogram [ECG] [EKG]: Secondary | ICD-10-CM | POA: Diagnosis present

## 2018-03-27 DIAGNOSIS — Z8672 Personal history of thrombophlebitis: Secondary | ICD-10-CM | POA: Diagnosis not present

## 2018-03-27 DIAGNOSIS — E0590 Thyrotoxicosis, unspecified without thyrotoxic crisis or storm: Secondary | ICD-10-CM | POA: Diagnosis present

## 2018-03-27 DIAGNOSIS — Z7901 Long term (current) use of anticoagulants: Secondary | ICD-10-CM

## 2018-03-27 DIAGNOSIS — E785 Hyperlipidemia, unspecified: Secondary | ICD-10-CM | POA: Diagnosis present

## 2018-03-27 DIAGNOSIS — I5030 Unspecified diastolic (congestive) heart failure: Secondary | ICD-10-CM | POA: Diagnosis not present

## 2018-03-27 DIAGNOSIS — R0603 Acute respiratory distress: Secondary | ICD-10-CM | POA: Diagnosis not present

## 2018-03-27 DIAGNOSIS — J9601 Acute respiratory failure with hypoxia: Secondary | ICD-10-CM | POA: Diagnosis present

## 2018-03-27 DIAGNOSIS — Z96642 Presence of left artificial hip joint: Secondary | ICD-10-CM | POA: Diagnosis not present

## 2018-03-27 DIAGNOSIS — Z833 Family history of diabetes mellitus: Secondary | ICD-10-CM | POA: Diagnosis not present

## 2018-03-27 DIAGNOSIS — Z794 Long term (current) use of insulin: Secondary | ICD-10-CM

## 2018-03-27 DIAGNOSIS — Z888 Allergy status to other drugs, medicaments and biological substances status: Secondary | ICD-10-CM | POA: Diagnosis not present

## 2018-03-27 DIAGNOSIS — I700 Atherosclerosis of aorta: Secondary | ICD-10-CM | POA: Diagnosis present

## 2018-03-27 DIAGNOSIS — I441 Atrioventricular block, second degree: Secondary | ICD-10-CM | POA: Diagnosis not present

## 2018-03-27 DIAGNOSIS — R030 Elevated blood-pressure reading, without diagnosis of hypertension: Secondary | ICD-10-CM | POA: Diagnosis not present

## 2018-03-27 DIAGNOSIS — Z79891 Long term (current) use of opiate analgesic: Secondary | ICD-10-CM

## 2018-03-27 DIAGNOSIS — K219 Gastro-esophageal reflux disease without esophagitis: Secondary | ICD-10-CM | POA: Diagnosis not present

## 2018-03-27 DIAGNOSIS — I350 Nonrheumatic aortic (valve) stenosis: Secondary | ICD-10-CM | POA: Diagnosis not present

## 2018-03-27 DIAGNOSIS — R61 Generalized hyperhidrosis: Secondary | ICD-10-CM | POA: Diagnosis not present

## 2018-03-27 DIAGNOSIS — I10 Essential (primary) hypertension: Secondary | ICD-10-CM | POA: Diagnosis present

## 2018-03-27 LAB — LACTIC ACID, PLASMA: Lactic Acid, Venous: 2 mmol/L (ref 0.5–1.9)

## 2018-03-27 LAB — COMPREHENSIVE METABOLIC PANEL
ALBUMIN: 3.6 g/dL (ref 3.5–5.0)
ALK PHOS: 72 U/L (ref 38–126)
ALT: 15 U/L (ref 0–44)
ANION GAP: 10 (ref 5–15)
AST: 27 U/L (ref 15–41)
BUN: 17 mg/dL (ref 8–23)
CALCIUM: 9.4 mg/dL (ref 8.9–10.3)
CO2: 28 mmol/L (ref 22–32)
Chloride: 101 mmol/L (ref 98–111)
Creatinine, Ser: 1.36 mg/dL — ABNORMAL HIGH (ref 0.61–1.24)
GFR calc Af Amer: 52 mL/min — ABNORMAL LOW (ref >60)
GFR calc non Af Amer: 45 mL/min — ABNORMAL LOW (ref >60)
GLUCOSE: 235 mg/dL — AB (ref 70–99)
POTASSIUM: 4.7 mmol/L (ref 3.5–5.1)
SODIUM: 139 mmol/L (ref 135–145)
Total Bilirubin: 0.7 mg/dL (ref 0.3–1.2)
Total Protein: 6.2 g/dL — ABNORMAL LOW (ref 6.5–8.1)

## 2018-03-27 LAB — I-STAT ARTERIAL BLOOD GAS, ED
Acid-base deficit: 1 mmol/L (ref 0.0–2.0)
BICARBONATE: 24.6 mmol/L (ref 20.0–28.0)
O2 Saturation: 98 %
TCO2: 26 mmol/L (ref 22–32)
pCO2 arterial: 45.3 mmHg (ref 32.0–48.0)
pH, Arterial: 7.344 — ABNORMAL LOW (ref 7.350–7.450)
pO2, Arterial: 109 mmHg — ABNORMAL HIGH (ref 83.0–108.0)

## 2018-03-27 LAB — CBC
HCT: 44.3 % (ref 39.0–52.0)
HEMOGLOBIN: 13.8 g/dL (ref 13.0–17.0)
MCH: 29.1 pg (ref 26.0–34.0)
MCHC: 31.2 g/dL (ref 30.0–36.0)
MCV: 93.5 fL (ref 78.0–100.0)
PLATELETS: 251 10*3/uL (ref 150–400)
RBC: 4.74 MIL/uL (ref 4.22–5.81)
RDW: 13.8 % (ref 11.5–15.5)
WBC: 11.1 10*3/uL — ABNORMAL HIGH (ref 4.0–10.5)

## 2018-03-27 LAB — I-STAT TROPONIN, ED: TROPONIN I, POC: 0.01 ng/mL (ref 0.00–0.08)

## 2018-03-27 LAB — I-STAT CHEM 8, ED
BUN: 24 mg/dL — ABNORMAL HIGH (ref 8–23)
CHLORIDE: 100 mmol/L (ref 98–111)
CREATININE: 1.2 mg/dL (ref 0.61–1.24)
Calcium, Ion: 1.17 mmol/L (ref 1.15–1.40)
GLUCOSE: 224 mg/dL — AB (ref 70–99)
HCT: 43 % (ref 39.0–52.0)
Hemoglobin: 14.6 g/dL (ref 13.0–17.0)
POTASSIUM: 4.6 mmol/L (ref 3.5–5.1)
Sodium: 138 mmol/L (ref 135–145)
TCO2: 29 mmol/L (ref 22–32)

## 2018-03-27 LAB — GLUCOSE, CAPILLARY
Glucose-Capillary: 212 mg/dL — ABNORMAL HIGH (ref 70–99)
Glucose-Capillary: 246 mg/dL — ABNORMAL HIGH (ref 70–99)

## 2018-03-27 LAB — MRSA PCR SCREENING: MRSA by PCR: NEGATIVE

## 2018-03-27 LAB — PROTIME-INR
INR: 0.99
Prothrombin Time: 13 seconds (ref 11.4–15.2)

## 2018-03-27 LAB — BRAIN NATRIURETIC PEPTIDE
B Natriuretic Peptide: 223.8 pg/mL — ABNORMAL HIGH (ref 0.0–100.0)
B Natriuretic Peptide: 768.1 pg/mL — ABNORMAL HIGH (ref 0.0–100.0)

## 2018-03-27 LAB — TROPONIN I
TROPONIN I: 0.25 ng/mL — AB (ref <0.03)
Troponin I: 0.42 ng/mL (ref <0.03)

## 2018-03-27 MED ORDER — ONDANSETRON HCL 4 MG/2ML IJ SOLN
4.0000 mg | Freq: Once | INTRAMUSCULAR | Status: AC
  Administered 2018-03-27: 4 mg via INTRAVENOUS
  Filled 2018-03-27: qty 2

## 2018-03-27 MED ORDER — SODIUM CHLORIDE 0.9 % IV SOLN
1.0000 g | INTRAVENOUS | Status: DC
  Administered 2018-03-28 – 2018-03-30 (×3): 1 g via INTRAVENOUS
  Filled 2018-03-27 (×3): qty 10

## 2018-03-27 MED ORDER — INSULIN GLARGINE 100 UNIT/ML ~~LOC~~ SOLN
15.0000 [IU] | Freq: Every day | SUBCUTANEOUS | Status: DC
  Administered 2018-03-27 – 2018-03-29 (×3): 15 [IU] via SUBCUTANEOUS
  Filled 2018-03-27 (×3): qty 0.15

## 2018-03-27 MED ORDER — PANTOPRAZOLE SODIUM 40 MG PO TBEC
40.0000 mg | DELAYED_RELEASE_TABLET | Freq: Every day | ORAL | Status: DC
  Administered 2018-03-28 – 2018-03-30 (×3): 40 mg via ORAL
  Filled 2018-03-27 (×3): qty 1

## 2018-03-27 MED ORDER — SODIUM CHLORIDE 0.9 % IV SOLN
500.0000 mg | INTRAVENOUS | Status: DC
  Administered 2018-03-28: 500 mg via INTRAVENOUS
  Filled 2018-03-27 (×2): qty 500

## 2018-03-27 MED ORDER — LISINOPRIL 2.5 MG PO TABS
2.5000 mg | ORAL_TABLET | Freq: Every day | ORAL | Status: DC
  Administered 2018-03-28 – 2018-03-30 (×3): 2.5 mg via ORAL
  Filled 2018-03-27 (×3): qty 1

## 2018-03-27 MED ORDER — INSULIN ASPART 100 UNIT/ML ~~LOC~~ SOLN
0.0000 [IU] | Freq: Three times a day (TID) | SUBCUTANEOUS | Status: DC
  Administered 2018-03-27 – 2018-03-28 (×2): 3 [IU] via SUBCUTANEOUS
  Administered 2018-03-28 – 2018-03-29 (×2): 2 [IU] via SUBCUTANEOUS
  Administered 2018-03-29: 3 [IU] via SUBCUTANEOUS
  Administered 2018-03-29: 1 [IU] via SUBCUTANEOUS
  Administered 2018-03-30: 5 [IU] via SUBCUTANEOUS
  Administered 2018-03-30: 2 [IU] via SUBCUTANEOUS

## 2018-03-27 MED ORDER — SODIUM CHLORIDE 0.9 % IV SOLN
1.0000 g | Freq: Once | INTRAVENOUS | Status: AC
  Administered 2018-03-27: 1 g via INTRAVENOUS
  Filled 2018-03-27: qty 10

## 2018-03-27 MED ORDER — SODIUM CHLORIDE 0.9 % IV SOLN
1.0000 g | INTRAVENOUS | Status: DC
  Filled 2018-03-27: qty 10

## 2018-03-27 MED ORDER — METHOCARBAMOL 500 MG PO TABS: 500.0000 mg | ORAL_TABLET | Freq: Three times a day (TID) | ORAL | Status: DC | PRN

## 2018-03-27 MED ORDER — INSULIN ASPART 100 UNIT/ML ~~LOC~~ SOLN
0.0000 [IU] | Freq: Every day | SUBCUTANEOUS | Status: DC
  Administered 2018-03-27 – 2018-03-28 (×2): 2 [IU] via SUBCUTANEOUS

## 2018-03-27 MED ORDER — AZITHROMYCIN 500 MG IV SOLR
500.0000 mg | Freq: Once | INTRAVENOUS | Status: AC
  Administered 2018-03-27: 500 mg via INTRAVENOUS
  Filled 2018-03-27: qty 500

## 2018-03-27 MED ORDER — SIMVASTATIN 40 MG PO TABS
40.0000 mg | ORAL_TABLET | Freq: Every day | ORAL | Status: DC
  Administered 2018-03-27 – 2018-03-29 (×3): 40 mg via ORAL
  Filled 2018-03-27 (×3): qty 1

## 2018-03-27 MED ORDER — TRAMADOL HCL 50 MG PO TABS
50.0000 mg | ORAL_TABLET | Freq: Four times a day (QID) | ORAL | Status: DC | PRN
  Filled 2018-03-27: qty 1

## 2018-03-27 MED ORDER — GABAPENTIN 100 MG PO CAPS
100.0000 mg | ORAL_CAPSULE | Freq: Three times a day (TID) | ORAL | Status: DC
  Administered 2018-03-27 – 2018-03-30 (×9): 100 mg via ORAL
  Filled 2018-03-27 (×8): qty 1

## 2018-03-27 MED ORDER — NAPHAZOLINE-GLYCERIN 0.012-0.2 % OP SOLN
2.0000 [drp] | Freq: Four times a day (QID) | OPHTHALMIC | Status: DC | PRN
  Filled 2018-03-27: qty 15

## 2018-03-27 MED ORDER — SODIUM CHLORIDE 0.9 % IV SOLN
Freq: Once | INTRAVENOUS | Status: AC
  Administered 2018-03-27: 08:00:00 via INTRAVENOUS

## 2018-03-27 NOTE — Progress Notes (Signed)
CRITICAL VALUE ALERT  Critical Value:  Troponin  0.25                          Lactic 2.0  Date & Time Notied:  03/27/18   Provider Notified: Elvera Lennox  Orders Received/Actions taken:

## 2018-03-27 NOTE — ED Triage Notes (Signed)
Pt to ED via [facility] from home, with c/o sudden onset shortness of breath-- arrived on C Pap- pink frothy sputum, pt is alert/ oriented x 4

## 2018-03-27 NOTE — Progress Notes (Signed)
Patient's o2 was increased to 5lpm as the patient's o2 sats dropped to 86%. The patient is tolerating well at this time with o2 sats 95%. Will continue to monitor.

## 2018-03-27 NOTE — H&P (Signed)
History and Physical    Jackson [name] HFW:[id number] DOB: 09/16/1929 DOA: 03/27/2018  I have briefly reviewed the patient's prior medical records in [facility]  PCP: Josetta Huddle, MD  Patient coming from: home  Chief Complaint: shortness of breath   HPI: Jackson [name] is a 84 y.o. male with medical history significant of aortic stenosis, insulin-dependent diabetes mellitus, hypertension, hyperlipidemia, history of bradycardia, who presents to the hospital with chief complaint of sudden onset of shortness of breath.  Patient is on BiPAP on my evaluation and history directly from patient is limited, but wife is at bedside.  She tells me that everything was within normal limits as of last night, he did not complain of any cough/chest congestion/shortness of breath, and went to bed.  When he woke up this morning patient had a coughing spell with productive blood-tinged mucus, and also had an episode of emesis in the setting of cough.  He was also complained of shortness of breath.  He denied any chest pain, no abdominal pain, no lightheadedness or dizziness.  EMS was called, and on arrival he was found to have low oxygen level into the 70s.  He was placed on oxygen and brought into the ED where he was placed immediately on BiPAP.  ED Course: He was found to be febrile with a temp of 101.3, tachypneic, ABG on 100% FiO2 showed 7.3/45/109.  Chest x-ray showed new patchy consolidations throughout the left lung suggestive of multilobar pneumonia.  He has a mild leukocytosis of 11.1 otherwise his blood work is relatively unremarkable.  Review of Systems: As per HPI otherwise 10 point review of systems negative.   Past Medical History:  Diagnosis Date  . Aortic stenosis    Mild LVH, EF 60-65, normal wall motion, grade 1 diastolic dysfunction, mild aortic stenosis (mean 12/peak 22), trivial MR, normal RVSF, PASP 47 (mild pulmonary hypertension) // Echo 3/19: mild LVH, EF 60-65, no RWMA, Gr 1 DD,  mild AS (mean 12, peak 20), Ao root mildly dilated (39 mm), Asc Aorta 38 mm, MAC, trivial MR   . Atherosclerosis of aorta ([place]) 12/16/2016   CXR 6/16: IMPRESSION: 1. Resolved left basilar airspace opacities. 2. Hazy nodularity in both lungs compatible with the patient's known pleural plaques. 3. Right anterior hemidiaphragmatic eventration. 4. Thoracic spondylosis. 5. Atherosclerotic calcification of the aortic arch.  . Bradycardia    Low HR (30s) noted during sleep during admx for sepsis in 2016; Mobitz 1, 2:1 block >> no indication for pacer; management of OSA recommended   . Diabetes mellitus without complication ([place])   . Diabetic neuropathy ([place])   . DJD (degenerative joint disease)    knee, hip  . GERD (gastroesophageal reflux disease)   . Heart murmur   . History of echocardiogram    a. Echo 6/16: mod LVH, EF 65-70, vigorous LVF, no RWMA, mild MR, mild LAE  . HLD (hyperlipidemia)   . Hypertension   . Hyperthyroidism   . Phlebitis of left leg   . Pneumonia 02/2015   admx with sepsis     Past Surgical History:  Procedure Laterality Date  . CATARACT EXTRACTION Right   . CIRCUMCISION    . FRACTURE SURGERY    . TOTAL HIP ARTHROPLASTY Left 09/24/2017   Procedure: LEFT TOTAL HIP ARTHROPLASTY ANTERIOR APPROACH;  Surgeon: Mcarthur Rossetti, MD;  Location: [facility];  Service: Orthopedics;  Laterality: Left;     reports that he quit smoking about 59 years ago. He has  never used smokeless tobacco. He reports that he does not drink alcohol or use drugs.  Allergies  Allergen Reactions  . Actos [Pioglitazone] Swelling  . Formaldehyde Other (See Comments)    Flu-like symptoms    Family History  Problem Relation Age of Onset  . Diabetes Mother   . Diabetes Sister   . Diabetes Brother   . Heart attack Neg Hx   . Stroke Neg Hx   . Hypertension Neg Hx     Prior to Admission medications   Medication Sig Start Date End Date Taking? Authorizing Provider  ACCU-CHEK AVIVA PLUS test  strip  07/30/17   [provider]  Alcohol Swabs (B-D SINGLE USE SWABS REGULAR) PADS  07/12/17   [provider]  Calcium Carb-Cholecalciferol (CALCIUM CARBONATE-VITAMIN D3 PO) Take by mouth 1 day or 1 dose.    [provider]  Calcium Carbonate-Vitamin D (CALCIUM 600+D PO) Take 1 tablet by mouth daily.    [provider]  gabapentin (NEURONTIN) 100 MG capsule Take 100 mg by mouth 3 (three) times daily.     [provider]  insulin aspart (NOVOLOG FLEXPEN) 100 UNIT/ML FlexPen Before each meal 3 times a day, 140-199 - 2 units, 200-250 - 4 units, 251-299 - 6 units,  300-349 - 8 units,  350 or above 10 units. 02/28/15   Florencia Reasons, MD  Insulin Glargine (LANTUS) 100 UNIT/ML Solostar Pen Inject 15 Units into the skin daily at 10 pm.    [provider]  lisinopril (PRINIVIL,ZESTRIL) 2.5 MG tablet Take 2.5 mg by mouth daily.  12/08/16   [provider]  methocarbamol (ROBAXIN) 500 MG tablet Take 500 mg by mouth every 8 (eight) hours as needed for muscle spasms.    [provider]  Multiple Vitamins-Minerals (MULTIVITAMIN PO) Take 1 tablet by mouth daily. Centrum Silver    [provider]  omeprazole (PRILOSEC) 20 MG capsule Take 20 mg by mouth daily.  10/20/16   [provider]  rivaroxaban (XARELTO) 20 MG TABS tablet 52m BID x 3 weeks, then  221mQd for 3 MONTHS 10/11/17   BlMcarthur RossettiMD  simvastatin (ZOCOR) 40 MG tablet Take 40 mg by mouth at bedtime.     [provider]  tetrahydrozoline-zinc (VISINE-AC) 0.05-0.25 % ophthalmic solution Place 2 drops into both eyes 3 (three) times daily as needed (dry eyes).    [provider]  traMADol (ULTRAM) 50 MG tablet Take 50 mg by mouth every 6 (six) hours as needed for severe pain.     [provider]    Physical Exam: Vitals:   03/27/18 0745 03/27/18 0748 03/27/18 0800 03/27/18 0815  BP: (!) 104/55  (!) 156/54 (!) 147/53  Pulse: 64  73  63  Resp: 16  (!) 26 19  Temp:  (!) 101.3 F (38.5 C)    TempSrc:  Rectal    SpO2: 100%  99% 100%  Weight:      Height:        Constitutional: NAD, appears comfortable on BiPAP Eyes: PERRL, lids and conjunctivae normal ENMT: Mucous membranes are moist. Neck: normal, supple Respiratory: No wheezing or crackles heard.  No appreciable rhonchi however he is on BiPAP and lungs are somewhat distant Cardiovascular: Regular rate and rhythm, soft SEM. No extremity edema. 2+ pedal pulses.  Abdomen: no tenderness, no masses palpated. Bowel sounds positive.  Musculoskeletal: no clubbing / cyanosis. Normal muscle tone.  Skin: no rashes, lesions, ulcers. No induration Neurologic: CN  2-12 grossly intact. Strength 5/5 in all 4.  Psychiatric: Normal judgment and insight. Alert and oriented x 3. Normal mood.   Labs on Admission: I have personally reviewed following labs and imaging studies  CBC: Recent Labs  Lab 03/27/18 0730 03/27/18 0735  WBC 11.1*  --   HGB 13.8 14.6  HCT 44.3 43.0  MCV 93.5  --   PLT 251  --    Basic Metabolic Panel: Recent Labs  Lab 03/27/18 0735  NA 138  K 4.6  CL 100  GLUCOSE 224*  BUN 24*  CREATININE 1.20   GFR: Estimated Creatinine Clearance: 42 mL/min (by C-G formula based on SCr of 1.2 mg/dL). Liver Function Tests: No results for input(s): AST, ALT, ALKPHOS, BILITOT, PROT, ALBUMIN in the last 168 hours. No results for input(s): LIPASE, AMYLASE in the last 168 hours. No results for input(s): AMMONIA in the last 168 hours. Coagulation Profile: Recent Labs  Lab 03/27/18 0730  INR 0.99   Cardiac Enzymes: No results for input(s): CKTOTAL, CKMB, CKMBINDEX, TROPONINI in the last 168 hours. BNP (last 3 results) No results for input(s): PROBNP in the last 8760 hours. HbA1C: No results for input(s): HGBA1C in the last 72 hours. CBG: No results for input(s): GLUCAP in the last 168 hours. Lipid Profile: No results for input(s): CHOL, HDL, LDLCALC,  TRIG, CHOLHDL, LDLDIRECT in the last 72 hours. Thyroid Function Tests: No results for input(s): TSH, T4TOTAL, FREET4, T3FREE, THYROIDAB in the last 72 hours. Anemia Panel: No results for input(s): VITAMINB12, FOLATE, FERRITIN, TIBC, IRON, RETICCTPCT in the last 72 hours. Urine analysis:    Component Value Date/Time   COLORURINE YELLOW 02/27/2015 1536   APPEARANCEUR CLEAR 02/27/2015 1536   LABSPEC 1.008 02/27/2015 1536   PHURINE 7.0 02/27/2015 1536   GLUCOSEU 250 (A) 02/27/2015 1536   HGBUR NEGATIVE 02/27/2015 1536   BILIRUBINUR NEGATIVE 02/27/2015 1536   KETONESUR NEGATIVE 02/27/2015 1536   PROTEINUR NEGATIVE 02/27/2015 1536   UROBILINOGEN 0.2 02/27/2015 1536   NITRITE NEGATIVE 02/27/2015 1536   LEUKOCYTESUR NEGATIVE 02/27/2015 1536     Radiological Exams on Admission: Dg Chest Portable 1 View  Result Date: 03/27/2018 CLINICAL DATA:  Dyspnea EXAM: PORTABLE CHEST 1 VIEW COMPARISON:  03/19/2015 chest radiograph. FINDINGS: Stable cardiomediastinal silhouette with top-normal heart size. No pneumothorax. No pleural effusion. New patchy consolidation throughout the left lung. Stable eventration of anterior right hemidiaphragm. Clear right lung. IMPRESSION: New patchy consolidation throughout the left lung suggestive of multilobar pneumonia. Recommend follow-up PA and lateral post treatment chest radiographs in 4-6 weeks. Electronically Signed   By: Ilona Sorrel M.D.   On: 03/27/2018 07:39    EKG: Independently reviewed.  Sinus rhythm, ST segment changes in V4 5 6, aVR, lead II and III  Assessment/Plan Active Problems:   Diabetes mellitus ([place])   Acute respiratory failure with hypoxia (HCC)   CAP (community acquired pneumonia)   Type 2 diabetes mellitus with hyperglycemia ([place])   Aortic stenosis   Status post total replacement of left hip    Acute hypoxic respiratory failure in the setting of probable community-acquired pneumonia with sepsis -Continue BiPAP, wean off as tolerated,  discussed with RT, will attempt to wean -Rx with ceftriaxone and azithromycin -Blood cultures obtained in the ED, obtain sputum cultures/Gram stain, strep pneumo and Legionella antigens  Abnormal EKG -Patient without any chest pain, however EKG looks a bit concerning, in the setting of diabetes may not have any chest pain. Will cycle cardiac enzymes  Type 2 diabetes  mellitus -Resume home Lantus, place on sliding scale, diabetic diet once he comes off of BiPAP  Prior DVT -In the setting of orthopedic surgery earlier this year, finished a course of Xarelto and not taking it anymore  Hypertension -Blood pressure on the normal/high side, resume lisinopril starting tomorrow  Hyperlipidemia -Continue statin   DVT prophylaxis: SCDs  Code Status: Partial (no CPR but intubation OK)  Family Communication: Wife and niece present at bedside Disposition Plan: Admit to stepdown, likely home when ready Consults called: None    Admission status: Inpatient  At the point of initial evaluation, it is my clinical opinion that admission for OBSERVATION is reasonable and necessary because the patient's presenting complaints in the context of their chronic conditions represent sufficient risk of deterioration or significant morbidity to constitute reasonable grounds for close observation in the hospital setting, but that the patient may be medically stable for discharge from the hospital within 24 to 48 hours.   At the time of admission, it appears that the appropriate admission status for this patient is INPATIENT. This is judged to be reasonable and necessary in order to provide the required high service intensity to ensure the patient's safety given the presenting symptoms, physical exam findings, and initial radiographic and laboratory data in the context of their chronic comorbidities. Current circumstances are hypoxic respiratory failure/CAP, and it is felt to place patient at high risk for further  clinical deterioration threatening life, limb, or organ. Moreover, it is my clinical judgment that the patient will require inpatient hospital care spanning beyond 2 midnights from the point of admission and that early discharge would result in unnecessary risk of decompensation and readmission or threat to life, limb or bodily function.   Marzetta Board, MD [facility] Pager [phone]  If 7PM-7AM, please contact night-coverage [email or web address] Password [email or web address]  03/27/2018, 8:29 AM

## 2018-03-27 NOTE — Progress Notes (Signed)
Report called to receiving RT, Meredith Pel, RRT.

## 2018-03-27 NOTE — Progress Notes (Signed)
Patient was brought in by EMS on CPAP and transitioned to BIPAP 18/5/100% upon arrival. The patient is tolerating well with normal ABG values. The patient has been coughing up pink, frothy secretions. The patient's FIO2 was decreased to 50% after ABG results obtained. Will continue to monitor patient.

## 2018-03-27 NOTE — Progress Notes (Signed)
CRITICAL VALUE ALERT  Critical Value:  Troponin 0.42  Date & Time Notied: 03/27/18 6378 Provider Notified: Elvera Lennox  Orders Received/Actions taken:

## 2018-03-27 NOTE — ED Provider Notes (Addendum)
[facility] EMERGENCY DEPARTMENT Provider Note   CSN: [id number] Arrival date & time: 03/27/18  9326     History   Chief Complaint Chief Complaint  Patient presents with  . Shortness of Breath    HPI Jackson [name] is a 84 y.o. male.  84 year old male with prior history of aortic stenosis, diabetes, lipidemia, hypertension, and pneumonia presents with respiratory distress.  Patient reports that he felt poorly yesterday and had a mild cough.  This morning he awoke with increased shortness of breath.  Patient without documented fever at home.  EMS reports the patient was hypoxic upon their arrival.  He had room air sats in the 70's.  He was also producing pink frothy sputum with a cough.  He was placed on CPAP enroute to the ED.  Upon my initial assessment the patient is now on BiPAP.  He appears to be comfortable.  He denies chest pain.  He has a cough that is mildly productive of pink-tinged sputum.  The history is provided by the patient, medical records and the EMS personnel.  Shortness of Breath  This is a new problem. The average episode lasts 1 day. The problem occurs rarely.The current episode started 3 to 5 hours ago. The problem has not changed since onset.Associated symptoms include cough. Pertinent negatives include no fever, no headaches and no chest pain.    Past Medical History:  Diagnosis Date  . Aortic stenosis    Mild LVH, EF 60-65, normal wall motion, grade 1 diastolic dysfunction, mild aortic stenosis (mean 12/peak 22), trivial MR, normal RVSF, PASP 47 (mild pulmonary hypertension) // Echo 3/19: mild LVH, EF 60-65, no RWMA, Gr 1 DD, mild AS (mean 12, peak 20), Ao root mildly dilated (39 mm), Asc Aorta 38 mm, MAC, trivial MR   . Atherosclerosis of aorta ([place]) 12/16/2016   CXR 6/16: IMPRESSION: 1. Resolved left basilar airspace opacities. 2. Hazy nodularity in both lungs compatible with the patient's known pleural plaques. 3. Right anterior  hemidiaphragmatic eventration. 4. Thoracic spondylosis. 5. Atherosclerotic calcification of the aortic arch.  . Bradycardia    Low HR (30s) noted during sleep during admx for sepsis in 2016; Mobitz 1, 2:1 block >> no indication for pacer; management of OSA recommended   . Diabetes mellitus without complication ([place])   . Diabetic neuropathy ([place])   . DJD (degenerative joint disease)    knee, hip  . GERD (gastroesophageal reflux disease)   . Heart murmur   . History of echocardiogram    a. Echo 6/16: mod LVH, EF 65-70, vigorous LVF, no RWMA, mild MR, mild LAE  . HLD (hyperlipidemia)   . Hypertension   . Hyperthyroidism   . Phlebitis of left leg   . Pneumonia 02/2015   admx with sepsis     Patient Active Problem List   Diagnosis Date Noted  . Status post total replacement of left hip 09/24/2017  . Pain in left hip 08/11/2017  . Unilateral primary osteoarthritis, left hip 08/11/2017  . Bradycardia 12/16/2016  . Atherosclerosis of aorta ([place]) 12/16/2016  . Aortic stenosis   . Severe sepsis ([place]) 02/25/2015  . Bilateral pneumonia 02/25/2015  . Diabetes mellitus ([place]) 02/25/2015  . Lactic acidosis 02/25/2015  . Hypoxia 02/25/2015  . Sepsis ([place]) 02/25/2015  . Acute respiratory failure with hypoxia ([place])   . CAP (community acquired pneumonia)   . SOB (shortness of breath)   . Type 2 diabetes mellitus with hyperglycemia (HCC)     Past  Surgical History:  Procedure Laterality Date  . CATARACT EXTRACTION Right   . CIRCUMCISION    . FRACTURE SURGERY    . TOTAL HIP ARTHROPLASTY Left 09/24/2017   Procedure: LEFT TOTAL HIP ARTHROPLASTY ANTERIOR APPROACH;  Surgeon: Mcarthur Rossetti, MD;  Location: [facility] ORS;  Service: Orthopedics;  Laterality: Left;        Home Medications    Prior to Admission medications   Medication Sig Start Date End Date Taking? Authorizing Provider  ACCU-CHEK AVIVA PLUS test strip  07/30/17   [provider]  Alcohol Swabs (B-D SINGLE USE SWABS  REGULAR) PADS  07/12/17   [provider]  Calcium Carb-Cholecalciferol (CALCIUM CARBONATE-VITAMIN D3 PO) Take by mouth 1 day or 1 dose.    [provider]  Calcium Carbonate-Vitamin D (CALCIUM 600+D PO) Take 1 tablet by mouth daily.    [provider]  gabapentin (NEURONTIN) 100 MG capsule Take 100 mg by mouth 3 (three) times daily.     [provider]  insulin aspart (NOVOLOG FLEXPEN) 100 UNIT/ML FlexPen Before each meal 3 times a day, 140-199 - 2 units, 200-250 - 4 units, 251-299 - 6 units,  300-349 - 8 units,  350 or above 10 units. 02/28/15   Florencia Reasons, MD  Insulin Glargine (LANTUS) 100 UNIT/ML Solostar Pen Inject 15 Units into the skin daily at 10 pm.    [provider]  lisinopril (PRINIVIL,ZESTRIL) 2.5 MG tablet Take 2.5 mg by mouth daily.  12/08/16   [provider]  methocarbamol (ROBAXIN) 500 MG tablet Take 500 mg by mouth every 8 (eight) hours as needed for muscle spasms.    [provider]  Multiple Vitamins-Minerals (MULTIVITAMIN PO) Take 1 tablet by mouth daily. Centrum Silver    [provider]  omeprazole (PRILOSEC) 20 MG capsule Take 20 mg by mouth daily.  10/20/16   [provider]  rivaroxaban (XARELTO) 20 MG TABS tablet 24m BID x 3 weeks, then  263mQd for 3 MONTHS 10/11/17   BlMcarthur RossettiMD  simvastatin (ZOCOR) 40 MG tablet Take 40 mg by mouth at bedtime.     [provider]  tetrahydrozoline-zinc (VISINE-AC) 0.05-0.25 % ophthalmic solution Place 2 drops into both eyes 3 (three) times daily as needed (dry eyes).    [provider]  traMADol (ULTRAM) 50 MG tablet Take 50 mg by mouth every 6 (six) hours as needed for severe pain.     [provider]    Family History Family History  Problem Relation Age of Onset  . Diabetes Mother   . Diabetes Sister   . Diabetes Brother   . Heart attack Neg Hx   . Stroke Neg Hx   . Hypertension Neg Hx     Social  History Social History   Tobacco Use  . Smoking status: Former Smoker    Last attempt to quit: 1960    Years since quitting: 59.5  . Smokeless tobacco: Never Used  Substance Use Topics  . Alcohol use: No  . Drug use: No     Allergies   Actos [pioglitazone] and Formaldehyde   Review of Systems Review of Systems  Unable to perform ROS: Acuity of condition  Constitutional: Negative for fever.  Respiratory: Positive for cough and shortness of breath.   Cardiovascular: Negative for chest pain.  Neurological: Negative for headaches.     Physical Exam Updated Vital Signs BP (!) 104/55   Pulse 64   Temp (!) 101.3 F (  38.5 C) (Rectal)   Resp 16   Ht 5' 8"  (1.727 m)   Wt 71.7 kg (158 lb)   SpO2 100%   BMI 24.02 kg/m   Physical Exam  Constitutional: He is oriented to person, place, and time. He appears well-developed and well-nourished. He appears distressed.  Moderate resp distress   Coughing with pink tinged sputum  On Bipap     HENT:  Head: Normocephalic and atraumatic.  Mouth/Throat: Oropharynx is clear and moist.  Eyes: Pupils are equal, round, and reactive to light. Conjunctivae and EOM are normal.  Neck: Normal range of motion. Neck supple.  Cardiovascular: Normal rate, regular rhythm and normal heart sounds.  Pulmonary/Chest: Effort normal. No respiratory distress.  Decreased BS at Left Base   Abdominal: Soft. He exhibits no distension. There is no tenderness.  Musculoskeletal: Normal range of motion. He exhibits no edema or deformity.  Neurological: He is alert and oriented to person, place, and time.  Skin: Skin is warm and dry.  Psychiatric: He has a normal mood and affect.  Nursing note and vitals reviewed.    ED Treatments / Results  Labs (all labs ordered are listed, but only abnormal results are displayed) Labs Reviewed  CBC - Abnormal; Notable for the following components:      Result Value   WBC 11.1 (*)    All other components within  normal limits  I-STAT ARTERIAL BLOOD GAS, ED - Abnormal; Notable for the following components:   pH, Arterial 7.344 (*)    pO2, Arterial 109.0 (*)    All other components within normal limits  I-STAT CHEM 8, ED - Abnormal; Notable for the following components:   BUN 24 (*)    Glucose, Bld 224 (*)    All other components within normal limits  CULTURE, BLOOD (ROUTINE X 2)  CULTURE, BLOOD (ROUTINE X 2)  PROTIME-INR  BRAIN NATRIURETIC PEPTIDE  COMPREHENSIVE METABOLIC PANEL  LACTIC ACID, PLASMA  LACTIC ACID, PLASMA  I-STAT TROPONIN, ED    EKG None  Radiology Dg Chest Portable 1 View  Result Date: 03/27/2018 CLINICAL DATA:  Dyspnea EXAM: PORTABLE CHEST 1 VIEW COMPARISON:  03/19/2015 chest radiograph. FINDINGS: Stable cardiomediastinal silhouette with top-normal heart size. No pneumothorax. No pleural effusion. New patchy consolidation throughout the left lung. Stable eventration of anterior right hemidiaphragm. Clear right lung. IMPRESSION: New patchy consolidation throughout the left lung suggestive of multilobar pneumonia. Recommend follow-up PA and lateral post treatment chest radiographs in 4-6 weeks. Electronically Signed   By: Ilona Sorrel M.D.   On: 03/27/2018 07:39    Procedures Procedures (including critical care time) CRITICAL CARE Performed by: Valarie Merino   Total critical care time: 30 minutes  Critical care time was exclusive of separately billable procedures and treating other patients.  Critical care was necessary to treat or prevent imminent or life-threatening deterioration.  Critical care was time spent personally by me on the following activities: development of treatment plan with patient and/or surrogate as well as nursing, discussions with consultants, evaluation of patient's response to treatment, examination of patient, obtaining history from patient or surrogate, ordering and performing treatments and interventions, ordering and review of laboratory  studies, ordering and review of radiographic studies, pulse oximetry and re-evaluation of patient's condition.  Medications Ordered in ED Medications  ondansetron (ZOFRAN) injection 4 mg (has no administration in time range)  cefTRIAXone (ROCEPHIN) 1 g in sodium chloride 0.9 % 100 mL IVPB (has no administration in time range)  azithromycin (ZITHROMAX) 500  mg in sodium chloride 0.9 % 250 mL IVPB (has no administration in time range)     Initial Impression / Assessment and Plan / ED Course  I have reviewed the triage vital signs and the nursing notes.  Pertinent labs & imaging results that were available during my care of the patient were reviewed by me and considered in my medical decision making (see chart for details).     MDM  Screen Complete  Patient is presenting for relatively acute onset respiratory distress. Patient appears improved on BiPAP. Presentation and workup suggests likely multi-lobar pneumonia. Patient given rocephin and azithromycin in the ED. Cultures obtained.   Patient and family aware of plan to admit.   Patient is Full Code.  Hospitalist Renne Crigler) service is aware of case and will evaluate for admission.   Final Clinical Impressions(s) / ED Diagnoses   Final diagnoses:  Respiratory distress    ED Discharge Orders    None       Valarie Merino, MD 03/27/18 0355    Valarie Merino, MD 03/27/18 [phone]

## 2018-03-28 ENCOUNTER — Inpatient Hospital Stay (HOSPITAL_COMMUNITY): Payer: Medicare HMO

## 2018-03-28 DIAGNOSIS — I5030 Unspecified diastolic (congestive) heart failure: Secondary | ICD-10-CM

## 2018-03-28 DIAGNOSIS — Z96642 Presence of left artificial hip joint: Secondary | ICD-10-CM

## 2018-03-28 LAB — STREP PNEUMONIAE URINARY ANTIGEN: Strep Pneumo Urinary Antigen: NEGATIVE

## 2018-03-28 LAB — TROPONIN I
Troponin I: 0.19 ng/mL (ref <0.03)
Troponin I: 0.32 ng/mL (ref <0.03)

## 2018-03-28 LAB — GLUCOSE, CAPILLARY
GLUCOSE-CAPILLARY: 112 mg/dL — AB (ref 70–99)
GLUCOSE-CAPILLARY: 225 mg/dL — AB (ref 70–99)
GLUCOSE-CAPILLARY: 214 mg/dL — AB (ref 70–99)
Glucose-Capillary: 155 mg/dL — ABNORMAL HIGH (ref 70–99)

## 2018-03-28 LAB — LACTIC ACID, PLASMA
Lactic Acid, Venous: 1.5 mmol/L (ref 0.5–1.9)
Lactic Acid, Venous: 1.2 mmol/L (ref 0.5–1.9)

## 2018-03-28 LAB — ECHOCARDIOGRAM COMPLETE
Height: 68 in
Weight: 2528 oz

## 2018-03-28 LAB — HIV ANTIBODY (ROUTINE TESTING W REFLEX): HIV SCREEN 4TH GENERATION: NONREACTIVE

## 2018-03-28 MED ORDER — IPRATROPIUM-ALBUTEROL 0.5-2.5 (3) MG/3ML IN SOLN
3.0000 mL | Freq: Four times a day (QID) | RESPIRATORY_TRACT | Status: DC
  Administered 2018-03-28: 3 mL via RESPIRATORY_TRACT
  Filled 2018-03-28: qty 3

## 2018-03-28 MED ORDER — IPRATROPIUM-ALBUTEROL 0.5-2.5 (3) MG/3ML IN SOLN
3.0000 mL | Freq: Three times a day (TID) | RESPIRATORY_TRACT | Status: DC
  Administered 2018-03-29: 3 mL via RESPIRATORY_TRACT
  Filled 2018-03-28: qty 3

## 2018-03-28 MED ORDER — DOCUSATE SODIUM 100 MG PO CAPS
100.0000 mg | ORAL_CAPSULE | Freq: Two times a day (BID) | ORAL | Status: DC | PRN
  Administered 2018-03-28 – 2018-03-29 (×2): 100 mg via ORAL
  Filled 2018-03-28 (×2): qty 1

## 2018-03-28 NOTE — Progress Notes (Signed)
Patient ID: Jackson [name], male   DOB: 12/31/1928, 84 y.o.   MRN: [id number]  PROGRESS NOTE    Jackson [name]  TMM:[id number] DOB: 09/30/1928 DOA: 03/27/2018 PCP: Marden Noble, MD   Brief Narrative:  84 year old male with a history of diabetes, aortic stenosis, hypertension presents to the emergency department with shortness of breath, cough, fever found to have community-acquired pneumonia and treated with IV Rocephin and IV azithromycin.  Patient was quite hypoxic on arrival initially requiring BiPAP support.  This is a new issue for him.  Patient feeling markedly better since mission but still requiring new oxygen requirements.   Assessment & Plan:   Principal Problem:   Acute respiratory failure with hypoxia (HCC) Active Problems:   CAP (community acquired pneumonia)   Diabetes mellitus (HCC)   Type 2 diabetes mellitus with hyperglycemia (HCC)   Aortic stenosis   Status post total replacement of left hip  Acute hypoxic respiratory failure in the setting of probable community-acquired pneumonia with sepsis -Initially briefly needed BiPAP in the emergency department this is been weaned off he is on 4 to 5 L of nasal cannula and doing well at this time.  Continue to wean oxygen as tolerates to keep oxygen sats above 88%. -Continue with IV ceftriaxone and azithromycin -Follow-up on blood cultures.  Negative thus far.  Abnormal EKG -Patient without any chest pain, however EKG looks a bit concerning, in the setting of diabetes may not have any chest pain.   Troponins have been mildly elevated at 0.42 peak which have been flat.  Cardiac echocardiogram is pending.  Patient will need full cardiac work-up with stress testing once this acute illness is resolved.  Type 2 diabetes mellitus -Resume home Lantus, place on sliding scale, diabetic diet  Prior DVT -In the setting of orthopedic surgery earlier this year, finished a course of Xarelto and not taking it  anymore  Hypertension -Blood pressure on the normal/high side, resume lisinopril   Hyperlipidemia -Continue statin   DVT prophylaxis: SCDs  code Status: Partial no CPR Family Communication: Wife  disposition Plan: Pending clinical improvement and when is able to wean off oxygen   Subjective: Patient up in bed eating breakfast and is feeling much better than admission yesterday  Objective: Vitals:   03/27/18 2330 03/28/18 0121 03/28/18 0400 03/28/18 0741  BP: (!) 80/60 (!) 142/55 (!) 131/56 (!) 150/47  Pulse: (!) 46  (!) 45 69  Resp: (!) 21  (!) 21 20  Temp: 98.3 F (36.8 C)  97.6 F (36.4 C) 98 F (36.7 C)  TempSrc: Oral  Oral Oral  SpO2: 96% 91%  92%  Weight:      Height:        Intake/Output Summary (Last 24 hours) at 03/28/2018 0915 Last data filed at 03/27/2018 2030 Gross per 24 hour  Intake 1750.42 ml  Output 150 ml  Net 1600.42 ml   Filed Weights   03/27/18 0738  Weight: 71.7 kg (158 lb)    Examination:  General exam: Appears calm and comfortable  Respiratory system: Clear to auscultation. Respiratory effort normal. Cardiovascular system: S1 & S2 heard, RRR. No JVD, murmurs, rubs, gallops or clicks. No pedal edema. Gastrointestinal system: Abdomen is nondistended, soft and nontender. No organomegaly or masses felt. Normal bowel sounds heard. Central nervous system: Alert and oriented. No focal neurological deficits. Extremities: Symmetric 5 x 5 power. Skin: No rashes, lesions or ulcers Psychiatry: Judgement and insight appear normal. Mood & affect appropriate.  Data Reviewed: I have personally reviewed following labs and imaging studies  CBC: Recent Labs  Lab 03/27/18 0730 03/27/18 0735  WBC 11.1*  --   HGB 13.8 14.6  HCT 44.3 43.0  MCV 93.5  --   PLT 251  --    Basic Metabolic Panel: Recent Labs  Lab 03/27/18 0730 03/27/18 0735  NA 139 138  K 4.7 4.6  CL 101 100  CO2 28  --   GLUCOSE 235* 224*  BUN 17 24*  CREATININE 1.36*  1.20  CALCIUM 9.4  --    GFR: Estimated Creatinine Clearance: 42 mL/min (by C-G formula based on SCr of 1.2 mg/dL). Liver Function Tests: Recent Labs  Lab 03/27/18 0730  AST 27  ALT 15  ALKPHOS 72  BILITOT 0.7  PROT 6.2*  ALBUMIN 3.6   No results for input(s): LIPASE, AMYLASE in the last 168 hours. No results for input(s): AMMONIA in the last 168 hours. Coagulation Profile: Recent Labs  Lab 03/27/18 0730  INR 0.99   Cardiac Enzymes: Recent Labs  Lab 03/27/18 1020 03/27/18 1645 03/27/18 2330  TROPONINI 0.25* 0.42* 0.32*   BNP (last 3 results) No results for input(s): PROBNP in the last 8760 hours. HbA1C: No results for input(s): HGBA1C in the last 72 hours. CBG: Recent Labs  Lab 03/27/18 1701 03/27/18 2114 03/28/18 0744  GLUCAP 212* 246* 112*   Lipid Profile: No results for input(s): CHOL, HDL, LDLCALC, TRIG, CHOLHDL, LDLDIRECT in the last 72 hours. Thyroid Function Tests: No results for input(s): TSH, T4TOTAL, FREET4, T3FREE, THYROIDAB in the last 72 hours. Anemia Panel: No results for input(s): VITAMINB12, FOLATE, FERRITIN, TIBC, IRON, RETICCTPCT in the last 72 hours. Sepsis Labs: Recent Labs  Lab 03/27/18 1020  LATICACIDVEN 2.0*    Recent Results (from the past 240 hour(s))  MRSA PCR Screening     Status: None   Collection Time: 03/27/18 10:08 AM  Result Value Ref Range Status   MRSA by PCR NEGATIVE NEGATIVE Final    Comment:        The GeneXpert MRSA Assay (FDA approved for NASAL specimens only), is one component of a comprehensive MRSA colonization surveillance program. It is not intended to diagnose MRSA infection nor to guide or monitor treatment for MRSA infections. Performed at [facility], [address] [place], [place] [address]       Radiology Studies: Dg Chest Portable 1 View  Result Date: 03/27/2018 CLINICAL DATA:  Dyspnea EXAM: PORTABLE CHEST 1 VIEW COMPARISON:  03/19/2015 chest radiograph. FINDINGS: Stable  cardiomediastinal silhouette with top-normal heart size. No pneumothorax. No pleural effusion. New patchy consolidation throughout the left lung. Stable eventration of anterior right hemidiaphragm. Clear right lung. IMPRESSION: New patchy consolidation throughout the left lung suggestive of multilobar pneumonia. Recommend follow-up PA and lateral post treatment chest radiographs in 4-6 weeks. Electronically Signed   By: Delbert Phenix M.D.   On: 03/27/2018 07:39    Scheduled Meds: . gabapentin  100 mg Oral TID  . insulin aspart  0-5 Units Subcutaneous QHS  . insulin aspart  0-9 Units Subcutaneous TID WC  . insulin glargine  15 Units Subcutaneous Q2200  . lisinopril  2.5 mg Oral Daily  . pantoprazole  40 mg Oral Daily  . simvastatin  40 mg Oral QHS   Continuous Infusions: . azithromycin 500 mg (03/28/18 0915)  . cefTRIAXone (ROCEPHIN)  IV 1 g (03/28/18 0835)     LOS: 1 day    Time spent: 25 minutes  Jackson Monica, MD [facility] Pager [phone]  If 7PM-7AM, please contact night-coverage [email or web address] Password [email or web address] 03/28/2018, 9:15 AM

## 2018-03-28 NOTE — Progress Notes (Signed)
  Echocardiogram 2D Echocardiogram has been performed.  Shawne Eskelson G Celia Friedland 03/28/2018, 3:23 PM

## 2018-03-28 NOTE — Progress Notes (Signed)
CRITICAL VALUE ALERT  Critical Value:  Troponin 0.32  Date & Time Notified:  03/28/2018 0056  Provider Notified: [facility] on call-[place], NP  Orders Received/Actions taken: No new orders at this time. Caswell Corwin, RN 03/28/18 12:58 AM

## 2018-03-28 NOTE — Evaluation (Signed)
Physical Therapy Evaluation Patient Details Name: CONLIN BRAHM MRN: [id number] DOB: April 28, 1929 Today's Date: 03/28/2018   History of Present Illness  84 yo male with onset of CAP, acute respiratory failure, Sepsis and elevated troponin was also noted to have O2 sat drops with effort.  PMHx:  ramp in front, DM, aortic stenosis, HTN, DVT, L THA,   Clinical Impression  Pt was seen for short eval with light short walk bedside due to loss of Control of O2 sats.  Pt was able to maintain his sats briefly then down with each sitting or standing bout.  Nursing and family in to observe and encourage mobility.  Will see acutely for strength and balance to try to get home with shorter time in rehab.    Follow Up Recommendations SNF    Equipment Recommendations  None recommended by PT    Recommendations for Other Services       Precautions / Restrictions Precautions Precautions: Fall Precaution Comments: ck O2 sats as he is on 4L O2 Restrictions Weight Bearing Restrictions: No      Mobility  Bed Mobility Overal bed mobility: Needs Assistance Bed Mobility: Supine to Sit;Sit to Supine     Supine to sit: Min assist Sit to supine: Min assist   General bed mobility comments: min help to support trunk in the am  Transfers Overall transfer level: Needs assistance Equipment used: Rolling walker (2 wheeled);1 person hand held assist Transfers: Sit to/from Stand Sit to Stand: Min assist            Ambulation/Gait Ambulation/Gait assistance: Min Chemical engineer (Feet): 4 Feet Assistive device: Rolling walker (2 wheeled) Gait Pattern/deviations: Step-through pattern;Decreased stride length Gait velocity: reduced Gait velocity interpretation: <1.8 ft/sec, indicate of risk for recurrent falls    Stairs            Wheelchair Mobility    Modified Rankin (Stroke Patients Only)       Balance Overall balance assessment: Needs assistance Sitting-balance support: Feet  supported Sitting balance-Leahy Scale: Good   Postural control: Right lateral lean Standing balance support: Bilateral upper extremity supported Standing balance-Leahy Scale: Fair                               Pertinent Vitals/Pain Pain Assessment: No/denies pain    Home Living Family/patient expects to be discharged to:: Skilled nursing facility Living Arrangements: Spouse/significant other               Additional Comments: Pt and spouse state they hope to rehab at [facility]    Prior Function Level of Independence: Independent;Independent with assistive device(s)               Hand Dominance   Dominant Hand: Right    Extremity/Trunk Assessment   Upper Extremity Assessment Upper Extremity Assessment: Defer to OT evaluation    Lower Extremity Assessment Lower Extremity Assessment: Generalized weakness    Cervical / Trunk Assessment Cervical / Trunk Assessment: Normal  Communication   Communication: No difficulties  Cognition Arousal/Alertness: Awake/alert Behavior During Therapy: WFL for tasks assessed/performed Overall Cognitive Status: Within Functional Limits for tasks assessed                                        General Comments General comments (skin integrity, edema, etc.): O2 sats dropped to 60% on  finger probe but PT and nursing discussed this being maybe skin temp    Exercises     Assessment/Plan    PT Assessment Patient needs continued PT services  PT Problem List Decreased strength;Decreased range of motion;Decreased activity tolerance;Decreased balance;Decreased mobility;Decreased coordination;Decreased knowledge of use of DME;Decreased knowledge of precautions;Cardiopulmonary status limiting activity       PT Treatment Interventions DME instruction;Gait training;Stair training;Functional mobility training;Therapeutic activities;Therapeutic exercise;Balance training;Neuromuscular re-education;Cognitive  remediation;Patient/family education;Wheelchair mobility training;Manual techniques;Modalities    PT Goals (Current goals can be found in the Care Plan section)  Acute Rehab PT Goals Patient Stated Goal: get stronger and home PT Goal Formulation: With patient Time For Goal Achievement: 04/11/18 Potential to Achieve Goals: Good    Frequency Min 3X/week   Barriers to discharge Inaccessible home environment;Decreased caregiver support O2 sat drops    Co-evaluation               AM-PAC PT "6 Clicks" Daily Activity  Outcome Measure Difficulty turning over in bed (including adjusting bedclothes, sheets and blankets)?: Unable Difficulty moving from lying on back to sitting on the side of the bed? : Unable Difficulty sitting down on and standing up from a chair with arms (e.g., wheelchair, bedside commode, etc,.)?: Unable       6 Click Score: 3    End of Session Equipment Utilized During Treatment: Gait belt;Oxygen Activity Tolerance: Patient tolerated treatment well;Patient limited by fatigue Patient left: in bed;with call bell/phone within reach;with bed alarm set Nurse Communication: Mobility status PT Visit Diagnosis: Unsteadiness on feet (R26.81);Difficulty in walking, not elsewhere classified (R26.2);Other (comment)(unable to move without O2 sats dropping)    Time: 1700-1729 PT Time Calculation (min) (ACUTE ONLY): 29 min   Charges:   PT Evaluation $PT Eval Moderate Complexity: 1 Mod PT Treatments $Therapeutic Activity: 8-22 mins   PT G Codes:   PT G-Codes **NOT FOR INPATIENT CLASS** Functional Assessment Tool Used: AM-PAC 6 Clicks Basic Mobility    Ivar Drape 03/28/2018, 11:57 PM   Samul Dada, PT MS Acute Rehab Dept. Number: [facility] [id number] and [facility] [phone]

## 2018-03-29 DIAGNOSIS — J9601 Acute respiratory failure with hypoxia: Secondary | ICD-10-CM

## 2018-03-29 LAB — CBC WITH DIFFERENTIAL/PLATELET
Abs Immature Granulocytes: 0 10*3/uL (ref 0.0–0.1)
Basophils Absolute: 0 10*3/uL (ref 0.0–0.1)
Basophils Relative: 0 %
EOS ABS: 0.2 10*3/uL (ref 0.0–0.7)
EOS PCT: 3 %
HEMATOCRIT: 35.7 % — AB (ref 39.0–52.0)
HEMOGLOBIN: 11.3 g/dL — AB (ref 13.0–17.0)
Immature Granulocytes: 0 %
LYMPHS ABS: 1.1 10*3/uL (ref 0.7–4.0)
LYMPHS PCT: 13 %
MCH: 29.7 pg (ref 26.0–34.0)
MCHC: 31.7 g/dL (ref 30.0–36.0)
MCV: 93.7 fL (ref 78.0–100.0)
MONO ABS: 1.1 10*3/uL — AB (ref 0.1–1.0)
MONOS PCT: 13 %
Neutro Abs: 6.1 10*3/uL (ref 1.7–7.7)
Neutrophils Relative %: 71 %
Platelets: 168 10*3/uL (ref 150–400)
RBC: 3.81 MIL/uL — ABNORMAL LOW (ref 4.22–5.81)
RDW: 13.6 % (ref 11.5–15.5)
WBC: 8.5 10*3/uL (ref 4.0–10.5)

## 2018-03-29 LAB — GLUCOSE, CAPILLARY
GLUCOSE-CAPILLARY: 164 mg/dL — AB (ref 70–99)
Glucose-Capillary: 127 mg/dL — ABNORMAL HIGH (ref 70–99)
Glucose-Capillary: 233 mg/dL — ABNORMAL HIGH (ref 70–99)
Glucose-Capillary: 199 mg/dL — ABNORMAL HIGH (ref 70–99)

## 2018-03-29 LAB — BASIC METABOLIC PANEL
Anion gap: 15 (ref 5–15)
BUN: 13 mg/dL (ref 8–23)
CALCIUM: 9.1 mg/dL (ref 8.9–10.3)
CHLORIDE: 103 mmol/L (ref 98–111)
CO2: 22 mmol/L (ref 22–32)
CREATININE: 1.09 mg/dL (ref 0.61–1.24)
GFR calc Af Amer: >60 mL/min (ref >60)
GFR calc non Af Amer: 59 mL/min — ABNORMAL LOW (ref >60)
GLUCOSE: 150 mg/dL — AB (ref 70–99)
Potassium: 4.3 mmol/L (ref 3.5–5.1)
Sodium: 140 mmol/L (ref 135–145)

## 2018-03-29 LAB — LEGIONELLA PNEUMOPHILA SEROGP 1 UR AG: L. pneumophila Serogp 1 Ur Ag: NEGATIVE

## 2018-03-29 MED ORDER — ORAL CARE MOUTH RINSE
15.0000 mL | Freq: Two times a day (BID) | OROMUCOSAL | Status: DC
  Administered 2018-03-29 – 2018-03-30 (×2): 15 mL via OROMUCOSAL

## 2018-03-29 MED ORDER — IPRATROPIUM-ALBUTEROL 0.5-2.5 (3) MG/3ML IN SOLN: 3.0000 mL | RESPIRATORY_TRACT | Status: DC | PRN

## 2018-03-29 MED ORDER — ACETAMINOPHEN 325 MG PO TABS
650.0000 mg | ORAL_TABLET | Freq: Four times a day (QID) | ORAL | Status: DC | PRN
  Administered 2018-03-29: 650 mg via ORAL

## 2018-03-29 MED ORDER — AZITHROMYCIN 250 MG PO TABS
500.0000 mg | ORAL_TABLET | Freq: Every day | ORAL | Status: DC
  Administered 2018-03-29 – 2018-03-30 (×2): 500 mg via ORAL
  Filled 2018-03-29 (×2): qty 2

## 2018-03-29 NOTE — Progress Notes (Signed)
CSW spoke with pt and pt granddaughter at bedside concerning plan for time of DC.  Patient was at SNF earlier this year and did not have a great experience so would like to return home.  States his wife is with him 24/7 and that his son and dtr in law live next to them and can check in regularly.  Granddaughter at bedside doesn't work and can also help as needed.  Pt has lots of equipment at home and a chair lift to take him up the stairs.  Pt has had home health through [facility] in the past and would be agreeable to them for home services again if recommended  CSW informed RNCM of pt plan to return home  CSW signing off  Burna Sis, LCSW Clinical Social Worker [phone]

## 2018-03-29 NOTE — Progress Notes (Signed)
Pt appears to be more confused tonight than last night. Pt was pulling at his telemetry and pulse ox cords. Pt was encouraged not to mess with the cords. Pt also had an accident with the urinal and needed to be cleaned up. Staff attempted to hide cords from pt. Family at bedside. Will continue to monitor. Caswell Corwin, RN 03/29/18 1:51 AM

## 2018-03-29 NOTE — Plan of Care (Signed)
  Problem: Clinical Measurements: Goal: Respiratory complications will improve Outcome: Progressing  Pt was weaned down to 3L [place] throughout the day. RN was able to remove oxygen with O2 sats @ 98% while awake, however when pt is sleeping, his O2 drops to the 80s. Pt placed back on 1L [place] while sleeping. Will continue to monitor.

## 2018-03-29 NOTE — Progress Notes (Signed)
Patient ID: Jackson [name], male   DOB: 07-13-1929, 84 y.o.   MRN: [id number]  PROGRESS NOTE    Jackson [name]  FAO:[id number] DOB: [birth year] DOA: 03/27/2018 PCP: Marden Noble, MD   Principal Problem:   Acute respiratory failure with hypoxia (HCC) Active Problems:   Diabetes mellitus (HCC)   CAP (community acquired pneumonia)   Type 2 diabetes mellitus with hyperglycemia (HCC)   Aortic stenosis   Status post total replacement of left hip  Brief Narrative:  84 year old male with a history of diabetes, aortic stenosis, hypertension presents to the emergency department with shortness of breath, cough, fever found to have community-acquired pneumonia and treated with IV Rocephin and IV azithromycin.  Patient was quite hypoxic on arrival initially requiring BiPAP support.  This is a new issue for him.  Patient feeling markedly better since mission but still requiring new oxygen requirements.   Assessment & Plan:    Acute hypoxic respiratory failure in the setting of probable community-acquired pneumonia with sepsis-----continues to improve clinically, we will try to wean down oxygen, continue azithromycin/Rocephin, cultures remain negative, tolerated   Abnormal EKG -Patient without any chest pain,  .   Troponins have been mildly elevated at 0.42 peak which have been flat.  Cardiac echocardiogram without wall motion abnormalities, EF is preserved at 55 to 60%, patient does have grade 1 diastolic dysfunction, overall left ventricular systolic function is preserved.   Consider possible outpatient stress test and further outpatient work-up once acute illness resolves  Type 2 diabetes mellitus -Resume home Lantus, place on sliding scale, diabetic diet  Prior DVT -In the setting of orthopedic surgery earlier this year, finished a course of Xarelto and not taking it anymore  Hypertension -Blood pressure on the normal/high side, resume lisinopril   Hyperlipidemia -Continue  statin  Disposition--PT recommends skilled nursing facility rehab, patient and family reluctant to go to SNF, they are considering going home with home health  DVT prophylaxis: SCDs  code Status: Partial no CPR Family Communication: Wife  disposition Plan:   Subjective:  wife and pastor at bedside, questions answered, cough or shortness of breath is better, no fevers no chest pains  Objective: Vitals:   03/29/18 0352 03/29/18 0744 03/29/18 0841 03/29/18 1626  BP:  (!) 152/57  (!) 143/58  Pulse:  64  (!) 55  Resp:  19  18  Temp:  98.3 F (36.8 C)  98.4 F (36.9 C)  TempSrc:  Oral  Oral  SpO2: 100% 98% 98% 99%  Weight:      Height:        Intake/Output Summary (Last 24 hours) at 03/29/2018 1833 Last data filed at 03/29/2018 1500 Gross per 24 hour  Intake 100 ml  Output 1494 ml  Net -1394 ml   Filed Weights   03/27/18 0738  Weight: 71.7 kg (158 lb)    Examination:  General exam: Appears calm and comfortable  Respiratory system: Diminished in bases, no wheezing Cardiovascular system: S1 & S2 heard, RRR. No JVD, murmurs, rubs, gallops or clicks. No pedal edema. Gastrointestinal system: Abdomen is nondistended, soft and nontender. No organomegaly or masses felt. Normal bowel sounds heard. Central nervous system: Alert and oriented. No focal neurological deficits. Extremities: Symmetric 5 x 5 power. Skin: No rashes, lesions or ulcers Psychiatry: Judgement and insight appear normal. Mood & affect appropriate.    CBC: Recent Labs  Lab 03/27/18 0730 03/27/18 0735 03/29/18 0608  WBC 11.1*  --  8.5  NEUTROABS  --   --  6.1  HGB 13.8 14.6 11.3*  HCT 44.3 43.0 35.7*  MCV 93.5  --  93.7  PLT 251  --  168   Basic Metabolic Panel: Recent Labs  Lab 03/27/18 0730 03/27/18 0735 03/29/18 0608  NA 139 138 140  K 4.7 4.6 4.3  CL 101 100 103  CO2 28  --  22  GLUCOSE 235* 224* 150*  BUN 17 24* 13  CREATININE 1.36* 1.20 1.09  CALCIUM 9.4  --  9.1   GFR: Estimated  Creatinine Clearance: 46.2 mL/min (by C-G formula based on SCr of 1.09 mg/dL). Liver Function Tests: Recent Labs  Lab 03/27/18 0730  AST 27  ALT 15  ALKPHOS 72  BILITOT 0.7  PROT 6.2*  ALBUMIN 3.6   No results for input(s): LIPASE, AMYLASE in the last 168 hours. No results for input(s): AMMONIA in the last 168 hours. Coagulation Profile: Recent Labs  Lab 03/27/18 0730  INR 0.99   Cardiac Enzymes: Recent Labs  Lab 03/27/18 1020 03/27/18 1645 03/27/18 2330 03/28/18 0929  TROPONINI 0.25* 0.42* 0.32* 0.19*   BNP (last 3 results) No results for input(s): PROBNP in the last 8760 hours. HbA1C: No results for input(s): HGBA1C in the last 72 hours. CBG: Recent Labs  Lab 03/28/18 1646 03/28/18 2122 03/29/18 0744 03/29/18 1112 03/29/18 1626  GLUCAP 155* 214* 127* 164* 233*   Lipid Profile: No results for input(s): CHOL, HDL, LDLCALC, TRIG, CHOLHDL, LDLDIRECT in the last 72 hours. Thyroid Function Tests: No results for input(s): TSH, T4TOTAL, FREET4, T3FREE, THYROIDAB in the last 72 hours. Anemia Panel: No results for input(s): VITAMINB12, FOLATE, FERRITIN, TIBC, IRON, RETICCTPCT in the last 72 hours. Sepsis Labs: Recent Labs  Lab 03/27/18 1020 03/28/18 0929 03/28/18 1152  LATICACIDVEN 2.0* 1.5 1.2    Recent Results (from the past 240 hour(s))  Culture, blood (routine x 2)     Status: None (Preliminary result)   Collection Time: 03/27/18  7:48 AM  Result Value Ref Range Status   Specimen Description BLOOD LEFT ANTECUBITAL  Final   Special Requests   Final    BOTTLES DRAWN AEROBIC AND ANAEROBIC Blood Culture adequate volume   Culture   Final    NO GROWTH 2 DAYS Performed at [facility], [address] [place], [place] [address]    Report Status PENDING  Incomplete  Culture, blood (routine x 2)     Status: None (Preliminary result)   Collection Time: 03/27/18  7:53 AM  Result Value Ref Range Status   Specimen Description BLOOD RIGHT ANTECUBITAL   Final   Special Requests   Final    BOTTLES DRAWN AEROBIC AND ANAEROBIC Blood Culture adequate volume   Culture   Final    NO GROWTH 2 DAYS Performed at [facility], [address] [place], [place] [address]    Report Status PENDING  Incomplete  MRSA PCR Screening     Status: None   Collection Time: 03/27/18 10:08 AM  Result Value Ref Range Status   MRSA by PCR NEGATIVE NEGATIVE Final    Comment:        The GeneXpert MRSA Assay (FDA approved for NASAL specimens only), is one component of a comprehensive MRSA colonization surveillance program. It is not intended to diagnose MRSA infection nor to guide or monitor treatment for MRSA infections. Performed at [facility], [address], [place], [place] [address]       Radiology Studies: No results found.  Scheduled  Meds: . azithromycin  500 mg Oral Daily  . gabapentin  100 mg Oral TID  . insulin aspart  0-5 Units Subcutaneous QHS  . insulin aspart  0-9 Units Subcutaneous TID WC  . insulin glargine  15 Units Subcutaneous Q2200  . lisinopril  2.5 mg Oral Daily  . pantoprazole  40 mg Oral Daily  . simvastatin  40 mg Oral QHS   Continuous Infusions: . cefTRIAXone (ROCEPHIN)  IV Stopped (03/29/18 0834)     LOS: 2 days    Time spent: 25 minutes    Shon Hale, MD [facility] Pager [phone]  If 7PM-7AM, please contact night-coverage [email or web address] Password [facility] An 03/29/2018, 6:33 PM

## 2018-03-30 LAB — BASIC METABOLIC PANEL
Anion gap: 9 (ref 5–15)
BUN: 13 mg/dL (ref 8–23)
CO2: 27 mmol/L (ref 22–32)
CREATININE: 1 mg/dL (ref 0.61–1.24)
Calcium: 9.3 mg/dL (ref 8.9–10.3)
Chloride: 102 mmol/L (ref 98–111)
GFR calc Af Amer: >60 mL/min (ref >60)
GFR calc non Af Amer: >60 mL/min (ref >60)
Glucose, Bld: 157 mg/dL — ABNORMAL HIGH (ref 70–99)
Potassium: 4.1 mmol/L (ref 3.5–5.1)
SODIUM: 138 mmol/L (ref 135–145)

## 2018-03-30 LAB — CBC WITH DIFFERENTIAL/PLATELET
Abs Immature Granulocytes: 0 10*3/uL (ref 0.0–0.1)
BASOS ABS: 0 10*3/uL (ref 0.0–0.1)
BASOS PCT: 0 %
EOS ABS: 0.4 10*3/uL (ref 0.0–0.7)
Eosinophils Relative: 5 %
HCT: 35 % — ABNORMAL LOW (ref 39.0–52.0)
Hemoglobin: 11.1 g/dL — ABNORMAL LOW (ref 13.0–17.0)
Immature Granulocytes: 0 %
Lymphocytes Relative: 19 %
Lymphs Abs: 1.4 10*3/uL (ref 0.7–4.0)
MCH: 29.4 pg (ref 26.0–34.0)
MCHC: 31.7 g/dL (ref 30.0–36.0)
MCV: 92.8 fL (ref 78.0–100.0)
MONO ABS: 0.9 10*3/uL (ref 0.1–1.0)
Monocytes Relative: 12 %
Neutro Abs: 4.7 10*3/uL (ref 1.7–7.7)
Neutrophils Relative %: 64 %
PLATELETS: 174 10*3/uL (ref 150–400)
RBC: 3.77 MIL/uL — ABNORMAL LOW (ref 4.22–5.81)
RDW: 13.4 % (ref 11.5–15.5)
WBC: 7.4 10*3/uL (ref 4.0–10.5)

## 2018-03-30 LAB — GLUCOSE, CAPILLARY
Glucose-Capillary: 163 mg/dL — ABNORMAL HIGH (ref 70–99)
Glucose-Capillary: 252 mg/dL — ABNORMAL HIGH (ref 70–99)

## 2018-03-30 MED ORDER — SENNOSIDES-DOCUSATE SODIUM 8.6-50 MG PO TABS: 2.0000 | ORAL_TABLET | Freq: Every day | ORAL | 1 refills | Status: AC

## 2018-03-30 MED ORDER — GUAIFENESIN ER 600 MG PO TB12: 600.0000 mg | ORAL_TABLET | Freq: Two times a day (BID) | ORAL | 0 refills | Status: AC

## 2018-03-30 MED ORDER — ALBUTEROL SULFATE HFA 108 (90 BASE) MCG/ACT IN AERS: 2.0000 | INHALATION_SPRAY | Freq: Four times a day (QID) | RESPIRATORY_TRACT | 2 refills | Status: DC | PRN

## 2018-03-30 MED ORDER — AZITHROMYCIN 500 MG PO TABS: 500.0000 mg | ORAL_TABLET | Freq: Every day | ORAL | 0 refills | Status: AC

## 2018-03-30 MED ORDER — CEFDINIR 300 MG PO CAPS: 300.0000 mg | ORAL_CAPSULE | Freq: Two times a day (BID) | ORAL | 0 refills | Status: DC

## 2018-03-30 MED ORDER — METHOCARBAMOL 500 MG PO TABS: 500.0000 mg | ORAL_TABLET | Freq: Three times a day (TID) | ORAL | 1 refills | Status: DC | PRN

## 2018-03-30 NOTE — Consult Note (Addendum)
            [facility] CM Primary Care Navigator  03/30/2018  COREY WESEMAN [birth year] [id number]   Went to see patient at the bedsideto identify possible discharge needs and was able to talk with wife [name]) as well. Wife reports that patient was having "chills/ fever, nausea and vomiting with blood" that hadled to this admission.(Acute respiratory failure with hypoxia; CAP (community acquired pneumonia)  Patient's wifeendorses Dr.Robert Kevan Ny [facility] as hisprimary care provider.   [facility] [employer] [facility] [employer] [facility] serviceto obtain medications without difficulty so far. Wife mentioned that patient had reached the "donut hole" but are still financially able to afford his medications. Patient was encouraged to notify primary care provider if issues arise regarding his medications.  Patient has beenmanaginghis medications at home with use of "pill box" system filled weekly.  Patient has been driving prior to admission but wife will be able to providetransportation to hisdoctors'appointments after discharge.  [employer] transportation benefits discussed with patient/ wifeas well.  Patientlives with wife at home, who serves as Furniture conservator/restorer him. His son and daughter in-law lives next door and can also provide needed assistance as stated.  Discharge plan is home with home health services when ready.  Patientand wife voiced understanding to call primary care provider's officewhen hereturnshome,for a post discharge follow-up within1- 2 weeksor sooner if needs arise.Patient letter (with PCP's contact number) was provided astheirreminder.  Discussed with patientandwife regarding [facility] CM services available for health managementand resourcesat home but denies any needs or concerns at this point. Wife states that she helps patient in managing his health issues (diabetes) at  home with primary care provider's direction and close follow-up.   Patient's wife voiced understanding of needto seekreferral from primary care provider to [facility] care management if deemednecessary and appropriate for anyservices in thefuture. Patient had previously received [facility] services (telephonic) when developed DVT post hip replacement.  [facility] care management information was provided for future needs that patient may have and wife was aware of it.  Patient/ wife however, verbally agreed and opted for [facility]MI Pneumonia calls to follow-up his recovery at home.  Referral made for [facility]MI Pneumonia calls after discharge.   For additional questions please contact:  Karin Golden A. Yarissa Reining, BSN, RN-BC [facility] PRIMARY CARE Navigator Cell: [phone]

## 2018-03-30 NOTE — Progress Notes (Signed)
RN notified by CCMD that pt had an episode of complete heart block. On-call [facility] provider, Schorr, NP notified and EKG obtained. EKG shows 2nd degree type 2 heart block which is pts baseline. Will continue to monitor.  Caswell Corwin, RN 03/30/18 2:38 AM

## 2018-03-30 NOTE — Discharge Summary (Signed)
Jackson [name], is a 84 y.o. male  DOB 1929/03/04  MRN [id number].  Admission date:  03/27/2018  Admitting Physician  Costin Karlyne Greenspan, MD  Discharge Date:  03/30/2018   Primary MD  Josetta Huddle, MD  Recommendations for primary care physician for things to follow:   1) take medications as prescribed 2) talk to your primary care doctor about setting up an outpatient sleep study so that you  can get your CPAP machine again 3)Avoid ibuprofen/Advil/Aleve/Motrin/Goody Powders/Naproxen/BC powders/Meloxicam/Diclofenac/Indomethacin and other Nonsteroidal anti-inflammatory medications as these will make you more likely to bleed and can cause stomach ulcers, can also cause Kidney problems.   Admission Diagnosis  Respiratory distress [R06.03]   Discharge Diagnosis  Respiratory distress [R06.03]    Principal Problem:   Acute respiratory failure with hypoxia (HCC) Active Problems:   Diabetes mellitus ([name])   CAP (community acquired pneumonia)   Type 2 diabetes mellitus with hyperglycemia ([name])   Aortic stenosis   Status post total replacement of left hip      Past Medical History:  Diagnosis Date  . Aortic stenosis    Mild LVH, EF 60-65, normal wall motion, grade 1 diastolic dysfunction, mild aortic stenosis (mean 12/peak 22), trivial MR, normal RVSF, PASP 47 (mild pulmonary hypertension) // Echo 3/19: mild LVH, EF 60-65, no RWMA, Gr 1 DD, mild AS (mean 12, peak 20), Ao root mildly dilated (39 mm), Asc Aorta 38 mm, MAC, trivial MR   . Atherosclerosis of aorta ([name]) 12/16/2016   CXR 6/16: IMPRESSION: 1. Resolved left basilar airspace opacities. 2. Hazy nodularity in both lungs compatible with the patient's known pleural plaques. 3. Right anterior hemidiaphragmatic eventration. 4. Thoracic spondylosis. 5. Atherosclerotic calcification of the aortic arch.  . Bradycardia    Low HR (30s) noted during sleep during  admx for sepsis in 2016; Mobitz 1, 2:1 block >> no indication for pacer; management of OSA recommended   . Diabetes mellitus without complication ([name])   . Diabetic neuropathy ([name])   . DJD (degenerative joint disease)    knee, hip  . GERD (gastroesophageal reflux disease)   . Heart murmur   . History of echocardiogram    a. Echo 6/16: mod LVH, EF 65-70, vigorous LVF, no RWMA, mild MR, mild LAE  . HLD (hyperlipidemia)   . Hypertension   . Hyperthyroidism   . Phlebitis of left leg   . Pneumonia 02/2015   admx with sepsis     Past Surgical History:  Procedure Laterality Date  . CATARACT EXTRACTION Right   . CIRCUMCISION    . FRACTURE SURGERY    . TOTAL HIP ARTHROPLASTY Left 09/24/2017   Procedure: LEFT TOTAL HIP ARTHROPLASTY ANTERIOR APPROACH;  Surgeon: Mcarthur Rossetti, MD;  Location: [facility];  Service: Orthopedics;  Laterality: Left;       HPI  from the history and physical done on the day of admission:    Chief Complaint: shortness of breath   HPI: Jackson [name] is a 84 y.o. male with medical history  significant of aortic stenosis, insulin-dependent diabetes mellitus, hypertension, hyperlipidemia, history of bradycardia, who presents to the hospital with chief complaint of sudden onset of shortness of breath.  Patient is on BiPAP on my evaluation and history directly from patient is limited, but wife is at bedside.  She tells me that everything was within normal limits as of last night, he did not complain of any cough/chest congestion/shortness of breath, and went to bed.  When he woke up this morning patient had a coughing spell with productive blood-tinged mucus, and also had an episode of emesis in the setting of cough.  He was also complained of shortness of breath.  He denied any chest pain, no abdominal pain, no lightheadedness or dizziness.  EMS was called, and on arrival he was found to have low oxygen level into the 70s.  He was placed on oxygen and brought into the  ED where he was placed immediately on BiPAP.  ED Course: He was found to be febrile with a temp of 101.3, tachypneic, ABG on 100% FiO2 showed 7.3/45/109.  Chest x-ray showed new patchy consolidations throughout the left lung suggestive of multilobar pneumonia.  He has a mild leukocytosis of 11.1 otherwise his blood work is relatively unremarkable.    Hospital Course:     Brief Narrative:  84 year old male with a history of diabetes, aortic stenosis, hypertension presents to the emergency department with shortness of breath, cough, fever found to have community-acquired pneumonia and treated with IV Rocephin and IV azithromycin.  Patient was quite hypoxic on arrival initially requiring BiPAP support.  This is a new issue for him.  Patient feeling markedly better since admission  .  Hypoxia resolved  Plan:- Acute hypoxic respiratory failure in the setting of probable community-acquired pneumoniawith sepsis-----continues to improve clinically,  Hypoxia has resolved, Treated with azithromycin/Rocephin, cultures remain negative, tolerated ambulation well, okay to discharge home on Omnicef and azithromycin   Abnormal EKG -Patient without any chest pain,    Troponins have been mildly elevated at 0.42 peak which have been flat.   troponin pattern is not consistent with ACS,, Cardiac echocardiogram without wall motion abnormalities, EF is preserved at 55 to 60%, patient does have grade 1 diastolic dysfunction, overall left ventricular systolic function is preserved.   Consider possible outpatient stress test and further outpatient work-up once acute illness resolves,  At baseline patient does have second-degree heart block  Type 2 diabetes mellitus--  Resume home insulin regimen   Prior DVT -In the setting of orthopedic surgery earlier this year, finished a course of Xarelto and not taking it anymore  Hypertension - stable, continue lisinopril   Hyperlipidemia -Continue  statin  Disposition--PT recommends skilled nursing facility rehab, patient and family reluctant to go to SNF, they  Insist on going home with home health.  Again family and patient refused skilled nursing facility placement at this time  DVT prophylaxis: SCDs  code Status: Partial no CPR Family Communication: Wife  disposition Plan:   Discharge Condition: stable  Follow UP  Follow-up Information    [facility].   Specialty:  Home Health Services Why:  [facility], HHPT Contact information: [address] [place] [address] [phone]          Diet and Activity recommendation:  As advised  Discharge Instructions    Discharge Instructions    Call MD for:  difficulty breathing, headache or visual disturbances   Complete by:  As directed    Call MD  for:  persistant dizziness or light-headedness   Complete by:  As directed    Call MD for:  persistant nausea and vomiting   Complete by:  As directed    Call MD for:  severe uncontrolled pain   Complete by:  As directed    Call MD for:  temperature >100.4   Complete by:  As directed    Diet - low sodium heart healthy   Complete by:  As directed    Diet - low sodium heart healthy   Complete by:  As directed    Discharge instructions   Complete by:  As directed    1) take medications as prescribed 2) talk to your primary care doctor about setting up an outpatient sleep study so that you  can get your CPAP machine again 3)Avoid ibuprofen/Advil/Aleve/Motrin/Goody Powders/Naproxen/BC powders/Meloxicam/Diclofenac/Indomethacin and other Nonsteroidal anti-inflammatory medications as these will make you more likely to bleed and can cause stomach ulcers, can also cause Kidney problems.   Increase activity slowly   Complete by:  As directed    Increase activity slowly   Complete by:  As directed         Discharge Medications     Allergies as of 03/30/2018      Reactions   Actos  [pioglitazone] Swelling   Formaldehyde Other (See Comments)   Flu-like symptoms      Medication List    STOP taking these medications   docusate sodium 100 MG capsule Commonly known as:  COLACE     TAKE these medications   ACCU-CHEK AVIVA PLUS test strip Generic drug:  glucose blood   albuterol 108 (90 Base) MCG/ACT inhaler Commonly known as:  PROVENTIL HFA;VENTOLIN HFA Inhale 2 puffs into the lungs every 6 (six) hours as needed for wheezing or shortness of breath.   aspirin EC 81 MG tablet Take 81 mg by mouth daily.   azithromycin 500 MG tablet Commonly known as:  ZITHROMAX Take 1 tablet (500 mg total) by mouth daily for 5 days.   B-D SINGLE USE SWABS REGULAR Pads   CALCIUM 600+D PO Take 1 tablet by mouth daily.   CALCIUM CARBONATE-VITAMIN D3 PO Take by mouth 1 day or 1 dose.   cefdinir 300 MG capsule Commonly known as:  OMNICEF Take 1 capsule (300 mg total) by mouth 2 (two) times daily.   gabapentin 100 MG capsule Commonly known as:  NEURONTIN Take 100 mg by mouth See admin instructions. Taking 2 capsules (245m) in the AM, and 1037mat night   guaiFENesin 600 MG 12 hr tablet Commonly known as:  MUCINEX Take 1 tablet (600 mg total) by mouth 2 (two) times daily for 10 days.   insulin aspart 100 UNIT/ML FlexPen Commonly known as:  NOVOLOG FLEXPEN Before each meal 3 times a day, 140-199 - 2 units, 200-250 - 4 units, 251-299 - 6 units,  300-349 - 8 units,  350 or above 10 units.   Insulin Glargine 100 UNIT/ML Solostar Pen Commonly known as:  LANTUS Inject 15 Units into the skin daily at 10 pm.   lisinopril 2.5 MG tablet Commonly known as:  PRINIVIL,ZESTRIL Take 2.5 mg by mouth daily.   methocarbamol 500 MG tablet Commonly known as:  ROBAXIN Take 1 tablet (500 mg total) by mouth every 8 (eight) hours as needed for muscle spasms.   MULTIVITAMIN PO Take 1 tablet by mouth daily. Centrum Silver   omeprazole 20 MG capsule Commonly known as:  PRILOSEC Take  20 mg by  mouth daily.   rivaroxaban 20 MG Tabs tablet Commonly known as:  XARELTO 18m BID x 3 weeks, then  267mQd for 3 MONTHS   senna-docusate 8.6-50 MG tablet Commonly known as:  Senokot-S Take 2 tablets by mouth at bedtime.   simvastatin 40 MG tablet Commonly known as:  ZOCOR Take 40 mg by mouth at bedtime.   tetrahydrozoline-zinc 0.05-0.25 % ophthalmic solution Commonly known as:  VISINE-AC Place 2 drops into both eyes 3 (three) times daily as needed (dry eyes).   traMADol 50 MG tablet Commonly known as:  ULTRAM Take 50 mg by mouth every 6 (six) hours as needed for severe pain.       Major procedures and Radiology Reports - PLEASE review detailed and final reports for all details, in brief -   Dg Chest Portable 1 View  Result Date: 03/27/2018 CLINICAL DATA:  Dyspnea EXAM: PORTABLE CHEST 1 VIEW COMPARISON:  03/19/2015 chest radiograph. FINDINGS: Stable cardiomediastinal silhouette with top-normal heart size. No pneumothorax. No pleural effusion. New patchy consolidation throughout the left lung. Stable eventration of anterior right hemidiaphragm. Clear right lung. IMPRESSION: New patchy consolidation throughout the left lung suggestive of multilobar pneumonia. Recommend follow-up PA and lateral post treatment chest radiographs in 4-6 weeks. Electronically Signed   By: JaIlona Sorrel.D.   On: 03/27/2018 07:39    Micro Results   Recent Results (from the past 240 hour(s))  Culture, blood (routine x 2)     Status: None (Preliminary result)   Collection Time: 03/27/18  7:48 AM  Result Value Ref Range Status   Specimen Description BLOOD LEFT ANTECUBITAL  Final   Special Requests   Final    BOTTLES DRAWN AEROBIC AND ANAEROBIC Blood Culture adequate volume   Culture   Final    NO GROWTH 2 DAYS Performed at [facility] [place] [address]  Report Status PENDING  Incomplete  Culture, blood (routine x 2)     Status: None (Preliminary result)    Collection Time: 03/27/18  7:53 AM  Result Value Ref Range Status   Specimen Description BLOOD RIGHT ANTECUBITAL  Final   Special Requests   Final    BOTTLES DRAWN AEROBIC AND ANAEROBIC Blood Culture adequate volume   Culture   Final    NO GROWTH 2 DAYS Performed at [facility] [place] [address]  Report Status PENDING  Incomplete  MRSA PCR Screening     Status: None   Collection Time: 03/27/18 10:08 AM  Result Value Ref Range Status   MRSA by PCR NEGATIVE NEGATIVE Final    Comment:        The GeneXpert MRSA Assay (FDA approved for NASAL specimens only), is one component of a comprehensive MRSA colonization surveillance program. It is not intended to diagnose MRSA infection nor to guide or monitor treatment for MRSA infections. Performed at [facility] [place] [address]      [name]   Subjective    ClAbbas Beyeneoday has no  New complaints, shortness of breath is better, hypoxia has resolved,  No fever  Or chills , No Nausea, Vomiting or Diarrhea     Patient has been seen and examined prior to discharge   Objective   Blood pressure (!) 142/54, pulse (!) 59, temperature 98.4 F (36.9 C), temperature source Oral, resp. rate 20, height 5' 8"  (1.727 m), weight  71.7 kg (158 lb), SpO2 100 %.   Intake/Output Summary (Last 24 hours) at 03/30/2018 1214 Last data filed at 03/30/2018 0900 Gross per 24 hour  Intake 240 ml  Output 300 ml  Net -60 ml    Exam Gen:- Awake Alert,  In no apparent distress  HEENT:- [place].AT, No sclera icterus Neck-Supple Neck,No JVD,.  Lungs-   Fair movement bilaterally, no wheezing no rales CV- S1, S2 normal Abd-  +ve B.Sounds, Abd Soft, No tenderness,    Extremity/Skin:- No  edema,    Good pulses Psych-affect is appropriate, oriented x3 Neuro-no new focal deficits, no tremors   Data Review   CBC w Diff:  Lab Results  Component Value Date   WBC 7.4 03/30/2018   HGB  11.1 (L) 03/30/2018   HCT 35.0 (L) 03/30/2018   PLT 174 03/30/2018   LYMPHOPCT 19 03/30/2018   MONOPCT 12 03/30/2018   EOSPCT 5 03/30/2018   BASOPCT 0 03/30/2018    CMP:  Lab Results  Component Value Date   NA 138 03/30/2018   K 4.1 03/30/2018   CL 102 03/30/2018   CO2 27 03/30/2018   BUN 13 03/30/2018   CREATININE 1.00 03/30/2018   PROT 6.2 (L) 03/27/2018   ALBUMIN 3.6 03/27/2018   BILITOT 0.7 03/27/2018   ALKPHOS 72 03/27/2018   AST 27 03/27/2018   ALT 15 03/27/2018    Total Discharge time is about 33 minutes  Roxan Hockey M.D on 03/30/2018 at 12:14 PM  [facility]   Office  [phone]  Voice Recognition Viviann Spare dictation system was used to create this note, attempts have been made to correct errors. Please contact the author with questions and/or clarifications.

## 2018-03-30 NOTE — Discharge Instructions (Signed)
1) take medications as prescribed 2) talk to your primary care doctor about setting up an outpatient sleep study so that you  can get your CPAP machine again 3)Avoid ibuprofen/Advil/Aleve/Motrin/Goody Powders/Naproxen/BC powders/Meloxicam/Diclofenac/Indomethacin and other Nonsteroidal anti-inflammatory medications as these will make you more likely to bleed and can cause stomach ulcers, can also cause Kidney problems.

## 2018-03-30 NOTE — Care Management Note (Signed)
Case Management Note  Patient Details  Name: Jackson [name] MRN: [id number] Date of Birth: 01/02/1929  Subjective/Objective:   From home with spouse, presents with CAP, refusing SNF, would like to go home.  States has had [facility] in the past and would like to work with them again for [facility], HHPT, referral made to [place] with [facility]. Soc will begin 24-48 hrs post dc.  He has a walker , w/chair, and chair lift at home.                 Action/Plan: DC home with [facility] services when ready.   Expected Discharge Date:                  Expected Discharge Plan:  Home w Home Health Services  In-House Referral:     Discharge planning Services  CM Consult  Post Acute Care Choice:  Home Health Choice offered to:  Spouse, Patient  DME Arranged:    DME Agency:     HH Arranged:  RN, PT HH Agency:  [facility]  Status of Service:  Completed, signed off  If discussed at Long Length of Stay Meetings, dates discussed:    Additional Comments:  Leone Haven, RN 03/30/2018, 10:36 AM

## 2018-03-30 NOTE — Progress Notes (Addendum)
Physical Therapy Treatment Patient Details Name: Jackson [name] MRN: [id number] DOB: May 17, 1929 Today's Date: 03/30/2018    History of Present Illness Pt is an 84 y.o. male admitted 03/27/18 with CAP, sepsis, and acute respiratory failure. PMH includes DM, aortic stenosis, HTN, DVT, L THA (09/2017).    PT Comments    Pt progressing well with mobility. Transfers and ambulates 350' with RW at supervision-level; intermittent cues for improved gait mechanics. Educ on energy conservation, fall risk reduction, and importance of continued mobility. Pt will have 24/7 support from family at home and a wheelchair ramp; owns all necessary DME, including RW. Discharge recs updated to HHPT services. If pt remains admitted, will follow acutely.   Follow Up Recommendations  Home health PT;Supervision for mobility/OOB     Equipment Recommendations  None recommended by PT    Recommendations for Other Services       Precautions / Restrictions Precautions Precautions: Fall Restrictions Weight Bearing Restrictions: No    Mobility  Bed Mobility Overal bed mobility: Modified Independent             General bed mobility comments: Increased time and effort  Transfers Overall transfer level: Needs assistance Equipment used: Rolling walker (2 wheeled) Transfers: Sit to/from Stand Sit to Stand: Supervision            Ambulation/Gait Ambulation/Gait assistance: Supervision Gait Distance (Feet): 350 Feet Assistive device: Rolling walker (2 wheeled) Gait Pattern/deviations: Step-through pattern;Decreased stride length;Trunk flexed Gait velocity: Decreased Gait velocity interpretation: 1.31 - 2.62 ft/sec, indicative of limited community ambulator General Gait Details: Slow, steady amb with RW and supervision. Intermittent cues for proximity to RW to allow for more upright posture   Stairs             Wheelchair Mobility    Modified Rankin (Stroke Patients Only)        Balance Overall balance assessment: Needs assistance Sitting-balance support: Feet supported Sitting balance-Leahy Scale: Good     Standing balance support: Bilateral upper extremity supported Standing balance-Leahy Scale: Fair Standing balance comment: Can static stand and take steps with no UE support and min guard                            Cognition Arousal/Alertness: Awake/alert Behavior During Therapy: WFL for tasks assessed/performed Overall Cognitive Status: Within Functional Limits for tasks assessed                                        Exercises      General Comments General comments (skin integrity, edema, etc.): Wife present during session      Pertinent Vitals/Pain Pain Assessment: No/denies pain    Home Living                      Prior Function            PT Goals (current goals can now be found in the care plan section) Acute Rehab PT Goals Patient Stated Goal: get stronger and home PT Goal Formulation: With patient Time For Goal Achievement: 04/11/18 Potential to Achieve Goals: Good Progress towards PT goals: Progressing toward goals    Frequency    Min 3X/week      PT Plan Discharge plan needs to be updated;Frequency needs to be updated    Co-evaluation  AM-PAC PT "6 Clicks" Daily Activity  Outcome Measure  Difficulty turning over in bed (including adjusting bedclothes, sheets and blankets)?: None Difficulty moving from lying on back to sitting on the side of the bed? : None Difficulty sitting down on and standing up from a chair with arms (e.g., wheelchair, bedside commode, etc,.)?: A Little Help needed moving to and from a bed to chair (including a wheelchair)?: A Little Help needed walking in hospital room?: A Little Help needed climbing 3-5 steps with a railing? : A Little 6 Click Score: 20    End of Session Equipment Utilized During Treatment: Gait belt Activity  Tolerance: Patient tolerated treatment well Patient left: in bed;with call bell/phone within reach;with family/visitor present;with nursing/sitter in room(seated EOB) Nurse Communication: Mobility status PT Visit Diagnosis: Unsteadiness on feet (R26.81);Difficulty in walking, not elsewhere classified (R26.2)     Time: 1610-9604 PT Time Calculation (min) (ACUTE ONLY): 16 min  Charges:  $Gait Training: 8-22 mins                    G Codes:      Ina Homes, PT, DPT Acute Rehab Services  Pager: [phone]  Malachy Chamber 03/30/2018, 1:34 PM

## 2018-04-01 LAB — CULTURE, BLOOD (ROUTINE X 2)
CULTURE: NO GROWTH
Culture: NO GROWTH
SPECIAL REQUESTS: ADEQUATE
SPECIAL REQUESTS: ADEQUATE

## 2018-04-07 ENCOUNTER — Other Ambulatory Visit: Payer: Self-pay

## 2018-04-07 NOTE — Patient Outreach (Signed)
[facility]) Care Management  04/07/2018  Jackson [name] March 18, 1929 [id number]  EMMI: pneumonia red alert Referral date: 04/07/18 Referral reason: swelling in hands/ feet or changes in weight Insurance: [employer] Day # 2  Telephone call to patient regarding EMMI pneumonia red alert. HIPAA verified with patient. Explained reason for call. Patient states he is not having the swelling in his feet like he was when he was discharged. Patient reports he feels as though the swelling has dissipated. Patient denies any additional symptoms.  He reports he finished his antibiotics on yesterday. Patient states he notices that he is able to get up and do more things now. He states he is feeling better.  Patient states he does not have home health . He states a home health nurse came out to evaluate him but found that he did not need the services.   Patient states he has a follow up appointment with his primary MD on 04/13/18.  He states he has transportation to his appointments. He states he has his medications and takes them as prescribed.  RNCM  advised patient that he would continue to get automated EMMI-pneumonia  post discharge calls to assess how he is doing following recent hospitalization and will receive a call from a nurse if any of their responses were abnormal. Patient voiced understanding. RNCM advised patient to notify MD of any changes in condition prior to scheduled appointment. RNCM provided contact name and number: [phone] or main office number [phone] and 24 hour nurse advise line [phone] by mail.  RNCM verified patient aware of 911 services for urgent/ emergent needs.   PLAN: RNCm will close patient due to patient being assessed and having no further needs. RNCM will send patients primary MD closure notification  RNCM will send patient [facility] brochure/ magnet as discussed.   [name] RN,BSN,CCM [facility] Telephonic  [phone]

## 2018-04-08 ENCOUNTER — Other Ambulatory Visit: Payer: Self-pay

## 2018-04-08 NOTE — Patient Outreach (Signed)
[facility]) Care Management  04/08/2018  Jackson [name] December 11, 1928 [id number]  EMMI: pneumonia red alert Referral date: 04/08/18 Referral reason: been to follow up appointment: no, Get up and moving at all: no Insurance: [employer] Day # 3  Telephone call to patient regarding EMMI pneumonia red alert. HIPAA verified. Explained reason for call. Patient states he has an appointment scheduled with his primary MD on 04/13/18.  Patient states, " I am back to my old self."  He reports he is up and moving around quite a bit.  Patient denies any further concerns or needs at this time.  Patient requested EMMI pneumonia automated calls be discontinued.  RNCM advised patient to notify MD of any changes in condition prior to scheduled appointment. RNCM verified patient aware of 911 services for urgent/ emergent needs.,  PLAN: RNCM will close patient due to patient being assessed and having no further needs.  RNCM will notify [facility] care management assistant to discontinued EMMI pneumonia automated calls at patients request.   [name] RN,BSN,CCM [facility] Telephonic  [phone]

## 2018-04-13 DIAGNOSIS — E1140 Type 2 diabetes mellitus with diabetic neuropathy, unspecified: Secondary | ICD-10-CM | POA: Diagnosis not present

## 2018-04-13 DIAGNOSIS — J189 Pneumonia, unspecified organism: Secondary | ICD-10-CM | POA: Diagnosis not present

## 2018-04-13 DIAGNOSIS — B351 Tinea unguium: Secondary | ICD-10-CM | POA: Diagnosis not present

## 2018-04-13 DIAGNOSIS — Z794 Long term (current) use of insulin: Secondary | ICD-10-CM | POA: Diagnosis not present

## 2018-04-13 DIAGNOSIS — Z79899 Other long term (current) drug therapy: Secondary | ICD-10-CM | POA: Diagnosis not present

## 2018-04-21 DIAGNOSIS — R001 Bradycardia, unspecified: Secondary | ICD-10-CM | POA: Diagnosis not present

## 2018-05-02 ENCOUNTER — Encounter: Payer: Self-pay | Admitting: Physician Assistant

## 2018-05-02 DIAGNOSIS — I10 Essential (primary) hypertension: Secondary | ICD-10-CM | POA: Diagnosis not present

## 2018-05-02 DIAGNOSIS — R0989 Other specified symptoms and signs involving the circulatory and respiratory systems: Secondary | ICD-10-CM | POA: Diagnosis not present

## 2018-05-02 DIAGNOSIS — Z0189 Encounter for other specified special examinations: Secondary | ICD-10-CM | POA: Diagnosis not present

## 2018-05-02 DIAGNOSIS — E782 Mixed hyperlipidemia: Secondary | ICD-10-CM | POA: Diagnosis not present

## 2018-05-17 DIAGNOSIS — Z79899 Other long term (current) drug therapy: Secondary | ICD-10-CM | POA: Diagnosis not present

## 2018-05-17 DIAGNOSIS — R001 Bradycardia, unspecified: Secondary | ICD-10-CM | POA: Diagnosis not present

## 2018-05-17 DIAGNOSIS — Z1389 Encounter for screening for other disorder: Secondary | ICD-10-CM | POA: Diagnosis not present

## 2018-05-17 DIAGNOSIS — N183 Chronic kidney disease, stage 3 (moderate): Secondary | ICD-10-CM | POA: Diagnosis not present

## 2018-05-17 DIAGNOSIS — E559 Vitamin D deficiency, unspecified: Secondary | ICD-10-CM | POA: Diagnosis not present

## 2018-05-17 DIAGNOSIS — B351 Tinea unguium: Secondary | ICD-10-CM | POA: Diagnosis not present

## 2018-05-17 DIAGNOSIS — Z0000 Encounter for general adult medical examination without abnormal findings: Secondary | ICD-10-CM | POA: Diagnosis not present

## 2018-05-17 DIAGNOSIS — R202 Paresthesia of skin: Secondary | ICD-10-CM | POA: Diagnosis not present

## 2018-05-17 DIAGNOSIS — J189 Pneumonia, unspecified organism: Secondary | ICD-10-CM | POA: Diagnosis not present

## 2018-05-17 DIAGNOSIS — E1140 Type 2 diabetes mellitus with diabetic neuropathy, unspecified: Secondary | ICD-10-CM | POA: Diagnosis not present

## 2018-05-17 DIAGNOSIS — E113299 Type 2 diabetes mellitus with mild nonproliferative diabetic retinopathy without macular edema, unspecified eye: Secondary | ICD-10-CM | POA: Diagnosis not present

## 2018-06-01 DIAGNOSIS — Z23 Encounter for immunization: Secondary | ICD-10-CM | POA: Diagnosis not present

## 2018-06-06 ENCOUNTER — Ambulatory Visit (INDEPENDENT_AMBULATORY_CARE_PROVIDER_SITE_OTHER): Payer: Medicare HMO | Admitting: Orthopaedic Surgery

## 2018-06-06 ENCOUNTER — Ambulatory Visit (INDEPENDENT_AMBULATORY_CARE_PROVIDER_SITE_OTHER): Payer: Medicare HMO

## 2018-06-06 ENCOUNTER — Encounter (INDEPENDENT_AMBULATORY_CARE_PROVIDER_SITE_OTHER): Payer: Self-pay | Admitting: Orthopaedic Surgery

## 2018-06-06 DIAGNOSIS — Z96642 Presence of left artificial hip joint: Secondary | ICD-10-CM

## 2018-06-06 NOTE — Progress Notes (Signed)
The patient is now 8 months status post a left total hip arthroplasty to treat severe osteoarthritis.  He is 84 years old and is very active.  He reports he is doing well and has no issues.  He was actually hospitalized back in July for pneumonia but is otherwise doing well.  On exam he is walking without assistive device.  He tolerates me easily putting his left hip to internal extra rotation with no pain at all.  X-rays confirm a well-seated implant with no complicating features of the left total hip arthroplasty.  This point follow-up will be as needed.  I talked to him in detail about things need to come back to [place] in terms of his hip or any other issues.  All question concerns were answered and addressed.

## 2018-06-15 DIAGNOSIS — R0989 Other specified symptoms and signs involving the circulatory and respiratory systems: Secondary | ICD-10-CM | POA: Diagnosis not present

## 2018-06-21 DIAGNOSIS — Z9289 Personal history of other medical treatment: Secondary | ICD-10-CM

## 2018-06-21 HISTORY — DX: Personal history of other medical treatment: Z92.89

## 2018-06-27 DIAGNOSIS — E782 Mixed hyperlipidemia: Secondary | ICD-10-CM | POA: Diagnosis not present

## 2018-06-27 DIAGNOSIS — R001 Bradycardia, unspecified: Secondary | ICD-10-CM | POA: Diagnosis not present

## 2018-06-27 DIAGNOSIS — Z0189 Encounter for other specified special examinations: Secondary | ICD-10-CM | POA: Diagnosis not present

## 2018-06-27 DIAGNOSIS — I10 Essential (primary) hypertension: Secondary | ICD-10-CM | POA: Diagnosis not present

## 2018-07-04 DIAGNOSIS — R0602 Shortness of breath: Secondary | ICD-10-CM | POA: Diagnosis not present

## 2018-07-04 DIAGNOSIS — R001 Bradycardia, unspecified: Secondary | ICD-10-CM | POA: Diagnosis not present

## 2018-07-11 DIAGNOSIS — R0989 Other specified symptoms and signs involving the circulatory and respiratory systems: Secondary | ICD-10-CM | POA: Diagnosis not present

## 2018-07-11 DIAGNOSIS — I4949 Other premature depolarization: Secondary | ICD-10-CM | POA: Diagnosis not present

## 2018-07-11 DIAGNOSIS — I10 Essential (primary) hypertension: Secondary | ICD-10-CM | POA: Diagnosis not present

## 2018-07-11 DIAGNOSIS — E782 Mixed hyperlipidemia: Secondary | ICD-10-CM | POA: Diagnosis not present

## 2018-09-19 DIAGNOSIS — J40 Bronchitis, not specified as acute or chronic: Secondary | ICD-10-CM | POA: Diagnosis not present

## 2018-10-31 DIAGNOSIS — J069 Acute upper respiratory infection, unspecified: Secondary | ICD-10-CM | POA: Diagnosis not present

## 2018-11-18 ENCOUNTER — Emergency Department (HOSPITAL_COMMUNITY): Payer: Medicare HMO

## 2018-11-18 ENCOUNTER — Other Ambulatory Visit: Payer: Self-pay

## 2018-11-18 ENCOUNTER — Encounter (HOSPITAL_COMMUNITY): Payer: Self-pay

## 2018-11-18 ENCOUNTER — Inpatient Hospital Stay (HOSPITAL_COMMUNITY)
Admission: EM | Admit: 2018-11-18 | Discharge: 2018-11-20 | DRG: 871 | Disposition: A | Discharge status: Home / Self Care | Payer: Medicare HMO | Attending: Internal Medicine | Admitting: Internal Medicine

## 2018-11-18 DIAGNOSIS — J209 Acute bronchitis, unspecified: Secondary | ICD-10-CM | POA: Diagnosis present

## 2018-11-18 DIAGNOSIS — J189 Pneumonia, unspecified organism: Secondary | ICD-10-CM | POA: Diagnosis present

## 2018-11-18 DIAGNOSIS — I130 Hypertensive heart and chronic kidney disease with heart failure and stage 1 through stage 4 chronic kidney disease, or unspecified chronic kidney disease: Secondary | ICD-10-CM | POA: Diagnosis present

## 2018-11-18 DIAGNOSIS — I5032 Chronic diastolic (congestive) heart failure: Secondary | ICD-10-CM | POA: Diagnosis present

## 2018-11-18 DIAGNOSIS — A419 Sepsis, unspecified organism: Secondary | ICD-10-CM

## 2018-11-18 DIAGNOSIS — Z79899 Other long term (current) drug therapy: Secondary | ICD-10-CM | POA: Diagnosis not present

## 2018-11-18 DIAGNOSIS — N182 Chronic kidney disease, stage 2 (mild): Secondary | ICD-10-CM | POA: Diagnosis present

## 2018-11-18 DIAGNOSIS — N179 Acute kidney failure, unspecified: Secondary | ICD-10-CM | POA: Diagnosis present

## 2018-11-18 DIAGNOSIS — Z794 Long term (current) use of insulin: Secondary | ICD-10-CM

## 2018-11-18 DIAGNOSIS — I350 Nonrheumatic aortic (valve) stenosis: Secondary | ICD-10-CM | POA: Diagnosis not present

## 2018-11-18 DIAGNOSIS — J181 Lobar pneumonia, unspecified organism: Secondary | ICD-10-CM | POA: Diagnosis not present

## 2018-11-18 DIAGNOSIS — I10 Essential (primary) hypertension: Secondary | ICD-10-CM | POA: Diagnosis not present

## 2018-11-18 DIAGNOSIS — R1111 Vomiting without nausea: Secondary | ICD-10-CM | POA: Diagnosis not present

## 2018-11-18 DIAGNOSIS — Z833 Family history of diabetes mellitus: Secondary | ICD-10-CM

## 2018-11-18 DIAGNOSIS — J9621 Acute and chronic respiratory failure with hypoxia: Secondary | ICD-10-CM | POA: Diagnosis present

## 2018-11-18 DIAGNOSIS — Z7982 Long term (current) use of aspirin: Secondary | ICD-10-CM | POA: Diagnosis not present

## 2018-11-18 DIAGNOSIS — Z66 Do not resuscitate: Secondary | ICD-10-CM | POA: Diagnosis present

## 2018-11-18 DIAGNOSIS — J441 Chronic obstructive pulmonary disease with (acute) exacerbation: Secondary | ICD-10-CM | POA: Diagnosis present

## 2018-11-18 DIAGNOSIS — E1165 Type 2 diabetes mellitus with hyperglycemia: Secondary | ICD-10-CM | POA: Diagnosis not present

## 2018-11-18 DIAGNOSIS — E1122 Type 2 diabetes mellitus with diabetic chronic kidney disease: Secondary | ICD-10-CM | POA: Diagnosis present

## 2018-11-18 DIAGNOSIS — E119 Type 2 diabetes mellitus without complications: Secondary | ICD-10-CM

## 2018-11-18 DIAGNOSIS — Z96642 Presence of left artificial hip joint: Secondary | ICD-10-CM | POA: Diagnosis present

## 2018-11-18 DIAGNOSIS — Z87891 Personal history of nicotine dependence: Secondary | ICD-10-CM | POA: Diagnosis not present

## 2018-11-18 DIAGNOSIS — J440 Chronic obstructive pulmonary disease with acute lower respiratory infection: Secondary | ICD-10-CM | POA: Diagnosis present

## 2018-11-18 DIAGNOSIS — E785 Hyperlipidemia, unspecified: Secondary | ICD-10-CM | POA: Diagnosis present

## 2018-11-18 DIAGNOSIS — E039 Hypothyroidism, unspecified: Secondary | ICD-10-CM | POA: Diagnosis present

## 2018-11-18 DIAGNOSIS — E1140 Type 2 diabetes mellitus with diabetic neuropathy, unspecified: Secondary | ICD-10-CM | POA: Diagnosis present

## 2018-11-18 DIAGNOSIS — K920 Hematemesis: Secondary | ICD-10-CM | POA: Diagnosis present

## 2018-11-18 DIAGNOSIS — R000 Tachycardia, unspecified: Secondary | ICD-10-CM | POA: Diagnosis not present

## 2018-11-18 DIAGNOSIS — J019 Acute sinusitis, unspecified: Secondary | ICD-10-CM | POA: Diagnosis present

## 2018-11-18 DIAGNOSIS — R0902 Hypoxemia: Secondary | ICD-10-CM | POA: Diagnosis not present

## 2018-11-18 DIAGNOSIS — R509 Fever, unspecified: Secondary | ICD-10-CM | POA: Diagnosis not present

## 2018-11-18 DIAGNOSIS — I519 Heart disease, unspecified: Secondary | ICD-10-CM

## 2018-11-18 DIAGNOSIS — J9601 Acute respiratory failure with hypoxia: Secondary | ICD-10-CM | POA: Diagnosis not present

## 2018-11-18 DIAGNOSIS — R0602 Shortness of breath: Secondary | ICD-10-CM | POA: Diagnosis not present

## 2018-11-18 HISTORY — DX: Heart disease, unspecified: I51.9

## 2018-11-18 LAB — COMPREHENSIVE METABOLIC PANEL
ALBUMIN: 3.5 g/dL (ref 3.5–5.0)
ALT: 15 U/L (ref 0–44)
AST: 17 U/L (ref 15–41)
Alkaline Phosphatase: 55 U/L (ref 38–126)
Anion gap: 13 (ref 5–15)
BUN: 13 mg/dL (ref 8–23)
CO2: 25 mmol/L (ref 22–32)
Calcium: 9.3 mg/dL (ref 8.9–10.3)
Chloride: 100 mmol/L (ref 98–111)
Creatinine, Ser: 1.32 mg/dL — ABNORMAL HIGH (ref 0.61–1.24)
GFR calc Af Amer: 55 mL/min — ABNORMAL LOW (ref >60)
GFR calc non Af Amer: 48 mL/min — ABNORMAL LOW (ref >60)
Glucose, Bld: 208 mg/dL — ABNORMAL HIGH (ref 70–99)
Potassium: 4.3 mmol/L (ref 3.5–5.1)
SODIUM: 138 mmol/L (ref 135–145)
Total Bilirubin: 0.6 mg/dL (ref 0.3–1.2)
Total Protein: 6.2 g/dL — ABNORMAL LOW (ref 6.5–8.1)

## 2018-11-18 LAB — URINALYSIS, ROUTINE W REFLEX MICROSCOPIC
Bilirubin Urine: NEGATIVE
Glucose, UA: 50 mg/dL — AB
Hgb urine dipstick: NEGATIVE
Ketones, ur: NEGATIVE mg/dL
Leukocytes,Ua: NEGATIVE
Nitrite: NEGATIVE
PH: 6 (ref 5.0–8.0)
Protein, ur: NEGATIVE mg/dL
Specific Gravity, Urine: 1.012 (ref 1.005–1.030)

## 2018-11-18 LAB — CBC WITH DIFFERENTIAL/PLATELET
Abs Immature Granulocytes: 0.04 10*3/uL (ref 0.00–0.07)
Basophils Absolute: 0 10*3/uL (ref 0.0–0.1)
Basophils Relative: 0 %
Eosinophils Absolute: 0.1 10*3/uL (ref 0.0–0.5)
Eosinophils Relative: 1 %
HCT: 44 % (ref 39.0–52.0)
Hemoglobin: 13.7 g/dL (ref 13.0–17.0)
Immature Granulocytes: 0 %
Lymphocytes Relative: 7 %
Lymphs Abs: 0.7 10*3/uL (ref 0.7–4.0)
MCH: 29.6 pg (ref 26.0–34.0)
MCHC: 31.1 g/dL (ref 30.0–36.0)
MCV: 95 fL (ref 80.0–100.0)
Monocytes Absolute: 0.9 10*3/uL (ref 0.1–1.0)
Monocytes Relative: 9 %
NEUTROS ABS: 8.9 10*3/uL — AB (ref 1.7–7.7)
Neutrophils Relative %: 83 %
Platelets: 215 10*3/uL (ref 150–400)
RBC: 4.63 MIL/uL (ref 4.22–5.81)
RDW: 12.9 % (ref 11.5–15.5)
WBC: 10.8 10*3/uL — ABNORMAL HIGH (ref 4.0–10.5)
nRBC: 0 % (ref 0.0–0.2)

## 2018-11-18 LAB — HEMOGLOBIN A1C
Hgb A1c MFr Bld: 8.2 % — ABNORMAL HIGH (ref 4.8–5.6)
Mean Plasma Glucose: 188.64 mg/dL

## 2018-11-18 LAB — BRAIN NATRIURETIC PEPTIDE: B Natriuretic Peptide: 205 pg/mL — ABNORMAL HIGH (ref 0.0–100.0)

## 2018-11-18 LAB — INFLUENZA PANEL BY PCR (TYPE A & B)
Influenza A By PCR: NEGATIVE
Influenza B By PCR: NEGATIVE

## 2018-11-18 LAB — LACTIC ACID, PLASMA
Lactic Acid, Venous: 2.6 mmol/L (ref 0.5–1.9)
Lactic Acid, Venous: 1.6 mmol/L (ref 0.5–1.9)
Lactic Acid, Venous: 2.2 mmol/L (ref 0.5–1.9)
Lactic Acid, Venous: 1.8 mmol/L (ref 0.5–1.9)

## 2018-11-18 LAB — GLUCOSE, CAPILLARY
Glucose-Capillary: 267 mg/dL — ABNORMAL HIGH (ref 70–99)
Glucose-Capillary: 402 mg/dL — ABNORMAL HIGH (ref 70–99)
Glucose-Capillary: 345 mg/dL — ABNORMAL HIGH (ref 70–99)

## 2018-11-18 LAB — PROTIME-INR
INR: 1 (ref 0.8–1.2)
Prothrombin Time: 13 seconds (ref 11.4–15.2)

## 2018-11-18 LAB — TROPONIN I: Troponin I: <0.03 ng/mL (ref <0.03)

## 2018-11-18 LAB — PROCALCITONIN: PROCALCITONIN: 3.14 ng/mL

## 2018-11-18 LAB — CBG MONITORING, ED: GLUCOSE-CAPILLARY: 182 mg/dL — AB (ref 70–99)

## 2018-11-18 LAB — STREP PNEUMONIAE URINARY ANTIGEN: Strep Pneumo Urinary Antigen: NEGATIVE

## 2018-11-18 MED ORDER — INSULIN GLARGINE 100 UNIT/ML ~~LOC~~ SOLN
15.0000 [IU] | Freq: Every day | SUBCUTANEOUS | Status: DC
  Administered 2018-11-18 – 2018-11-19 (×2): 15 [IU] via SUBCUTANEOUS
  Filled 2018-11-18 (×2): qty 0.15

## 2018-11-18 MED ORDER — SODIUM CHLORIDE 0.9 % IV SOLN
500.0000 mg | INTRAVENOUS | Status: DC
  Administered 2018-11-18 – 2018-11-19 (×2): 500 mg via INTRAVENOUS
  Filled 2018-11-18 (×2): qty 500

## 2018-11-18 MED ORDER — ASPIRIN EC 81 MG PO TBEC
81.0000 mg | DELAYED_RELEASE_TABLET | Freq: Every day | ORAL | Status: DC
  Administered 2018-11-18 – 2018-11-20 (×3): 81 mg via ORAL
  Filled 2018-11-18 (×3): qty 1

## 2018-11-18 MED ORDER — POLYETHYLENE GLYCOL 3350 17 G PO PACK
17.0000 g | PACK | Freq: Every day | ORAL | Status: DC | PRN
  Administered 2018-11-20: 17 g via ORAL
  Filled 2018-11-18: qty 1

## 2018-11-18 MED ORDER — ACETAMINOPHEN 325 MG PO TABS: 650.0000 mg | ORAL_TABLET | Freq: Four times a day (QID) | ORAL | Status: DC | PRN

## 2018-11-18 MED ORDER — SODIUM CHLORIDE 0.9 % IV BOLUS
500.0000 mL | Freq: Once | INTRAVENOUS | Status: AC
  Administered 2018-11-18: 500 mL via INTRAVENOUS

## 2018-11-18 MED ORDER — SODIUM CHLORIDE 0.9 % IV SOLN
2.0000 g | INTRAVENOUS | Status: DC
  Administered 2018-11-18 – 2018-11-20 (×3): 2 g via INTRAVENOUS
  Filled 2018-11-18 (×3): qty 20

## 2018-11-18 MED ORDER — ONDANSETRON HCL 4 MG/2ML IJ SOLN: 4.0000 mg | Freq: Four times a day (QID) | INTRAMUSCULAR | Status: DC | PRN

## 2018-11-18 MED ORDER — ACETAMINOPHEN 650 MG RE SUPP: 650.0000 mg | Freq: Four times a day (QID) | RECTAL | Status: DC | PRN

## 2018-11-18 MED ORDER — PREDNISONE 20 MG PO TABS: 40.0000 mg | ORAL_TABLET | Freq: Every day | ORAL | Status: DC

## 2018-11-18 MED ORDER — SENNOSIDES-DOCUSATE SODIUM 8.6-50 MG PO TABS
1.0000 | ORAL_TABLET | Freq: Every day | ORAL | Status: DC
  Administered 2018-11-18 – 2018-11-19 (×2): 1 via ORAL
  Filled 2018-11-18 (×2): qty 1

## 2018-11-18 MED ORDER — INSULIN ASPART 100 UNIT/ML ~~LOC~~ SOLN
0.0000 [IU] | Freq: Every day | SUBCUTANEOUS | Status: DC
  Administered 2018-11-18: 4 [IU] via SUBCUTANEOUS
  Administered 2018-11-19: 3 [IU] via SUBCUTANEOUS

## 2018-11-18 MED ORDER — INSULIN ASPART 100 UNIT/ML ~~LOC~~ SOLN
0.0000 [IU] | Freq: Three times a day (TID) | SUBCUTANEOUS | Status: DC
  Administered 2018-11-18 – 2018-11-19 (×2): 15 [IU] via SUBCUTANEOUS
  Administered 2018-11-19: 2 [IU] via SUBCUTANEOUS
  Administered 2018-11-19: 8 [IU] via SUBCUTANEOUS
  Administered 2018-11-20: 2 [IU] via SUBCUTANEOUS

## 2018-11-18 MED ORDER — SODIUM CHLORIDE 0.9 % IV BOLUS: 1000.0000 mL | Freq: Once | INTRAVENOUS | Status: DC

## 2018-11-18 MED ORDER — ROSUVASTATIN CALCIUM 5 MG PO TABS
10.0000 mg | ORAL_TABLET | Freq: Every day | ORAL | Status: DC
  Administered 2018-11-18 – 2018-11-19 (×2): 10 mg via ORAL
  Filled 2018-11-18 (×2): qty 2

## 2018-11-18 MED ORDER — ONDANSETRON HCL 4 MG PO TABS: 4.0000 mg | ORAL_TABLET | Freq: Four times a day (QID) | ORAL | Status: DC | PRN

## 2018-11-18 MED ORDER — ONDANSETRON HCL 4 MG/2ML IJ SOLN
4.0000 mg | Freq: Once | INTRAMUSCULAR | Status: AC
  Administered 2018-11-18: 4 mg via INTRAVENOUS
  Filled 2018-11-18: qty 2

## 2018-11-18 MED ORDER — ACETAMINOPHEN 325 MG PO TABS
650.0000 mg | ORAL_TABLET | Freq: Once | ORAL | Status: AC
  Administered 2018-11-18: 650 mg via ORAL
  Filled 2018-11-18: qty 2

## 2018-11-18 MED ORDER — PREDNISONE 20 MG PO TABS
40.0000 mg | ORAL_TABLET | Freq: Every day | ORAL | Status: DC
  Administered 2018-11-18 – 2018-11-20 (×3): 40 mg via ORAL
  Filled 2018-11-18 (×3): qty 2

## 2018-11-18 MED ORDER — PANTOPRAZOLE SODIUM 40 MG IV SOLR
40.0000 mg | Freq: Once | INTRAVENOUS | Status: AC
  Administered 2018-11-18: 40 mg via INTRAVENOUS
  Filled 2018-11-18: qty 40

## 2018-11-18 MED ORDER — SODIUM CHLORIDE 0.9 % IV SOLN
INTRAVENOUS | Status: DC
  Administered 2018-11-18 – 2018-11-19 (×2): via INTRAVENOUS

## 2018-11-18 MED ORDER — SODIUM CHLORIDE 0.9% FLUSH
3.0000 mL | Freq: Two times a day (BID) | INTRAVENOUS | Status: DC
  Administered 2018-11-18 – 2018-11-19 (×4): 3 mL via INTRAVENOUS

## 2018-11-18 MED ORDER — GABAPENTIN 300 MG PO CAPS
300.0000 mg | ORAL_CAPSULE | Freq: Every day | ORAL | Status: DC
  Administered 2018-11-18 – 2018-11-19 (×2): 300 mg via ORAL
  Filled 2018-11-18 (×2): qty 1

## 2018-11-18 NOTE — ED Notes (Signed)
Patient transported to X-ray 

## 2018-11-18 NOTE — ED Provider Notes (Signed)
[facility] EMERGENCY DEPARTMENT Provider Note   CSN: [id number] Arrival date & time: 11/18/18  1607    History   Chief Complaint No chief complaint on file.   HPI [name] is a 85 y.o. male.     HPI  85 year old male presents with shortness of breath.  History is somewhat limited as the patient is an overall poor historian.  History is also taken from EMS who brought the patient in.  EMS was called because the patient was vomiting blood this morning.  EMS reports that looks like he is spitting up blood and has had some cough.  However the patient also reports some nausea.  He states has been coughing for a couple weeks.  He denies chest pain or shortness of breath.  EMS noted his oxygen to be in the 80s and placed him on oxygen by nasal cannula.  The emesis/hemoptysis is bright red.  Patient is on aspirin but denies any other blood thinners.  Past Medical History:  Diagnosis Date  . Aortic stenosis    Mild LVH, EF 60-65, normal wall motion, grade 1 diastolic dysfunction, mild aortic stenosis (mean 12/peak 22), trivial MR, normal RVSF, PASP 47 (mild pulmonary hypertension) // Echo 3/19: mild LVH, EF 60-65, no RWMA, Gr 1 DD, mild AS (mean 12, peak 20), Ao root mildly dilated (39 mm), Asc Aorta 38 mm, MAC, trivial MR   . Atherosclerosis of aorta ([place]) 12/16/2016   CXR 6/16: IMPRESSION: 1. Resolved left basilar airspace opacities. 2. Hazy nodularity in both lungs compatible with the patient's known pleural plaques. 3. Right anterior hemidiaphragmatic eventration. 4. Thoracic spondylosis. 5. Atherosclerotic calcification of the aortic arch.  . Bradycardia    Low HR (30s) noted during sleep during admx for sepsis in 2016; Mobitz 1, 2:1 block >> no indication for pacer; management of OSA recommended   . Diabetes mellitus without complication ([place])   . Diabetic neuropathy ([place])   . DJD (degenerative joint disease)    knee, hip  . GERD (gastroesophageal reflux  disease)   . Heart murmur   . History of echocardiogram    a. Echo 6/16: mod LVH, EF 65-70, vigorous LVF, no RWMA, mild MR, mild LAE  . HLD (hyperlipidemia)   . Hypertension   . Hyperthyroidism   . Phlebitis of left leg   . Pneumonia 02/2015   admx with sepsis     Patient Active Problem List   Diagnosis Date Noted  . Status post total replacement of left hip 09/24/2017  . Pain in left hip 08/11/2017  . Unilateral primary osteoarthritis, left hip 08/11/2017  . Bradycardia 12/16/2016  . Atherosclerosis of aorta ([place]) 12/16/2016  . Aortic stenosis   . Severe sepsis ([place]) 02/25/2015  . Bilateral pneumonia 02/25/2015  . Diabetes mellitus ([place]) 02/25/2015  . Lactic acidosis 02/25/2015  . Hypoxia 02/25/2015  . Sepsis ([place]) 02/25/2015  . Acute respiratory failure with hypoxia ([place])   . CAP (community acquired pneumonia)   . SOB (shortness of breath)   . Type 2 diabetes mellitus with hyperglycemia [facility])     Past Surgical History:  Procedure Laterality Date  . CATARACT EXTRACTION Right   . CIRCUMCISION    . FRACTURE SURGERY    . TOTAL HIP ARTHROPLASTY Left 09/24/2017   Procedure: LEFT TOTAL HIP ARTHROPLASTY ANTERIOR APPROACH;  Surgeon: Mcarthur Rossetti, MD;  Location: [facility] ORS;  Service: Orthopedics;  Laterality: Left;        Home Medications  Prior to Admission medications   Medication Sig Start Date End Date Taking? Authorizing Provider  albuterol (PROVENTIL HFA;VENTOLIN HFA) 108 (90 Base) MCG/ACT inhaler Inhale 2 puffs into the lungs every 6 (six) hours as needed for wheezing or shortness of breath. 03/30/18  Yes Emokpae, Courage, MD  aspirin EC 81 MG tablet Take 81 mg by mouth daily.   Yes [provider]  Calcium Carbonate-Vitamin D (CALCIUM 600+D PO) Take 1 tablet by mouth daily.   Yes [provider]  gabapentin (NEURONTIN) 100 MG capsule Take 300 mg by mouth at bedtime.    Yes [provider]  insulin aspart (NOVOLOG FLEXPEN) 100 UNIT/ML  FlexPen Before each meal 3 times a day, 140-199 - 2 units, 200-250 - 4 units, 251-299 - 6 units,  300-349 - 8 units,  350 or above 10 units. 02/28/15  Yes Florencia Reasons, MD  Insulin Glargine (LANTUS) 100 UNIT/ML Solostar Pen Inject 15 Units into the skin daily at 10 pm.   Yes [provider]  lisinopril (PRINIVIL,ZESTRIL) 5 MG tablet Take 5 mg by mouth daily. 10/24/18  Yes [provider]  Multiple Vitamins-Minerals (MULTIVITAMIN PO) Take 1 tablet by mouth daily. Centrum Silver   Yes [provider]  omeprazole (PRILOSEC) 20 MG capsule Take 20 mg by mouth daily as needed (reflux).  10/20/16  Yes [provider]  rosuvastatin (CRESTOR) 10 MG tablet Take 10 mg by mouth at bedtime. 10/24/18  Yes [provider]  senna-docusate (SENOKOT-S) 8.6-50 MG tablet Take 2 tablets by mouth at bedtime. Patient taking differently: Take 1 tablet by mouth at bedtime.  03/30/18 03/30/19 Yes Emokpae, Courage, MD  tetrahydrozoline-zinc (VISINE-AC) 0.05-0.25 % ophthalmic solution Place 2 drops into both eyes 3 (three) times daily as needed (dry eyes).   Yes [provider]  ACCU-CHEK AVIVA PLUS test strip  07/30/17   [provider]  Alcohol Swabs (B-D SINGLE USE SWABS REGULAR) PADS  07/12/17   [provider]  methocarbamol (ROBAXIN) 500 MG tablet Take 1 tablet (500 mg total) by mouth every 8 (eight) hours as needed for muscle spasms. Patient not taking: Reported on 11/18/2018 03/30/18   Roxan Hockey, MD  rivaroxaban (XARELTO) 20 MG TABS tablet 42m BID x 3 weeks, then  249mQd for 3 MONTHS Patient not taking: Reported on 03/27/2018 10/11/17   BlMcarthur RossettiMD    Family History Family History  Problem Relation Age of Onset  . Diabetes Mother   . Diabetes Sister   . Diabetes Brother   . Heart attack Neg Hx   . Stroke Neg Hx   . Hypertension Neg Hx     Social History Social History   Tobacco Use  . Smoking status: Former Smoker    Last  attempt to quit: 1960    Years since quitting: 60.2  . Smokeless tobacco: Never Used  Substance Use Topics  . Alcohol use: No  . Drug use: No     Allergies   Actos [pioglitazone] and Formaldehyde   Review of Systems Review of Systems  Constitutional: Positive for fever.  Respiratory: Positive for cough and shortness of breath.   Cardiovascular: Negative for chest pain and leg swelling.  Gastrointestinal: Positive for nausea and vomiting. Negative for abdominal pain.  All other systems reviewed and are negative.    Physical Exam Updated Vital Signs BP (!) 133/43   Pulse (!) 57   Temp (!) 102.9 F (39.4 C) (Rectal)   Resp (!) 24  Ht 5' 7"  (1.702 m)   Wt 86.2 kg   SpO2 94%   BMI 29.76 kg/m   Physical Exam Vitals signs and nursing note reviewed.  Constitutional:      General: He is not in acute distress.    Appearance: He is well-developed. He is not ill-appearing or diaphoretic.     Comments: Bright red blood on t-shirt from prior emesis/hemoptysis  HENT:     Head: Normocephalic and atraumatic.     Right Ear: External ear normal.     Left Ear: External ear normal.     Nose: Nose normal.  Eyes:     General:        Right eye: No discharge.        Left eye: No discharge.  Neck:     Musculoskeletal: Neck supple.  Cardiovascular:     Rate and Rhythm: Regular rhythm. Bradycardia present.     Heart sounds: Normal heart sounds.  Pulmonary:     Effort: Pulmonary effort is normal. Tachypnea (mild) present. No accessory muscle usage or respiratory distress.     Breath sounds: Examination of the right-lower field reveals rales. Examination of the left-lower field reveals rales. Rales present.  Abdominal:     Palpations: Abdomen is soft.     Tenderness: There is no abdominal tenderness.  Musculoskeletal:     Right lower leg: No edema.     Left lower leg: No edema.  Skin:    General: Skin is warm and dry.  Neurological:     Mental Status: He is alert.      Comments: Patient is awake, alert, oriented to person, place, and the month.  He does not answer when asked for the year.  Psychiatric:        Mood and Affect: Mood is not anxious.      ED Treatments / Results  Labs (all labs ordered are listed, but only abnormal results are displayed) Labs Reviewed  COMPREHENSIVE METABOLIC PANEL - Abnormal; Notable for the following components:      Result Value   Glucose, Bld 208 (*)    Creatinine, Ser 1.32 (*)    Total Protein 6.2 (*)    GFR calc non Af Amer 48 (*)    GFR calc Af Amer 55 (*)    All other components within normal limits  BRAIN NATRIURETIC PEPTIDE - Abnormal; Notable for the following components:   B Natriuretic Peptide 205.0 (*)    All other components within normal limits  CBC WITH DIFFERENTIAL/PLATELET - Abnormal; Notable for the following components:   WBC 10.8 (*)    Neutro Abs 8.9 (*)    All other components within normal limits  LACTIC ACID, PLASMA - Abnormal; Notable for the following components:   Lactic Acid, Venous 2.6 (*)    All other components within normal limits  URINALYSIS, ROUTINE W REFLEX MICROSCOPIC - Abnormal; Notable for the following components:   Glucose, UA 50 (*)    All other components within normal limits  CBG MONITORING, ED - Abnormal; Notable for the following components:   Glucose-Capillary 182 (*)    All other components within normal limits  CULTURE, BLOOD (ROUTINE X 2)  CULTURE, BLOOD (ROUTINE X 2)  URINE CULTURE  TROPONIN I  PROTIME-INR  LACTIC ACID, PLASMA  INFLUENZA PANEL BY PCR (TYPE A & B)    EKG EKG Interpretation  Date/Time:  Friday November 18 2018 09:03:26 EST Ventricular Rate:  58 PR Interval:    QRS Duration:  83 QT Interval:  347 QTC Calculation: 341 R Axis:   23 Text Interpretation:  Sinus rhythm Anteroseptal infarct, old Borderline ST depression, lateral leads Confirmed by Sherwood Gambler [phone]) on 11/18/2018 9:18:43 AM   Radiology Dg Chest 2 View  Result  Date: 11/18/2018 CLINICAL DATA:  Hematemesis that began this morning. Shortness of breath. EXAM: CHEST - 2 VIEW COMPARISON:  03/27/2018 and earlier, including CT chest 03/19/2008. FINDINGS: AP ERECT and LATERAL images were obtained. Cardiac silhouette normal in size for AP technique, unchanged. Thoracic aorta atherosclerotic, unchanged. Hilar and mediastinal contours otherwise unremarkable. 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 Airspace consolidation involving the LEFT UPPER LOBE. Lungs otherwise clear. Pulmonary vascularity normal. No pleural effusions. Degenerative changes and DISH involving the thoracic spine. Degenerative changes involving the RIGHT shoulder joint and calcific RIGHT supraspinatus tendinitis. IMPRESSION: Acute pneumonia involving the LEFT UPPER LOBE. Electronically Signed   By: Evangeline Dakin M.D.   On: 11/18/2018 10:30    Procedures .Critical Care Performed by: Sherwood Gambler, MD Authorized by: Sherwood Gambler, MD   Critical care provider statement:    Critical care time (minutes):  35   Critical care time was exclusive of:  Separately billable procedures and treating other patients   Critical care was necessary to treat or prevent imminent or life-threatening deterioration of the following conditions:  Respiratory failure and sepsis   Critical care was time spent personally by me on the following activities:  Development of treatment plan with patient or surrogate, discussions with consultants, evaluation of patient's response to treatment, examination of patient, obtaining history from patient or surrogate, ordering and performing treatments and interventions, ordering and review of laboratory studies, ordering and review of radiographic studies, pulse oximetry, re-evaluation of patient's condition and review of old charts   (including critical care time)  Medications Ordered in ED Medications  cefTRIAXone (ROCEPHIN) 2 g in sodium chloride 0.9  % 100 mL IVPB (0 g Intravenous Stopped 11/18/18 1023)  azithromycin (ZITHROMAX) 500 mg in sodium chloride 0.9 % 250 mL IVPB (0 mg Intravenous Stopped 11/18/18 1049)  acetaminophen (TYLENOL) tablet 650 mg (650 mg Oral Given 11/18/18 0943)  pantoprazole (PROTONIX) injection 40 mg (40 mg Intravenous Given 11/18/18 0939)  ondansetron (ZOFRAN) injection 4 mg (4 mg Intravenous Given 11/18/18 0942)  sodium chloride 0.9 % bolus 500 mL (500 mLs Intravenous New Bag/Given 11/18/18 1050)     Initial Impression / Assessment and Plan / ED Course  I have reviewed the triage vital signs and the nursing notes.  Pertinent labs & imaging results that were available during my care of the patient were reviewed by me and considered in my medical decision making (see chart for details).        Patient is mildly hypoxic initially and placed on 2 L nasal cannula.  No distress.  No vomiting blood here.  When I have talked to the wife who is arrived, it sounds more more like he is coughing and clearing his throat and then spits up blood rather than hematemesis.  The patient has not been hypotensive.  He was originally given IV Protonix but I think now that the history is more clear, I think it is more lung pathology.  X-ray does show pneumonia.  He was given Rocephin, azithromycin, and will evaluate for flu.  The patient has mild lactic acid elevation will be given a trial of fluids.  Dr. Lorin Mercy to admit.  Final Clinical Impressions(s) / ED Diagnoses   Final diagnoses:  Community  acquired pneumonia of left upper lobe of lung ([place])  Sepsis due to pneumonia [facility])    ED Discharge Orders    None       Sherwood Gambler, MD 11/18/18 [phone]

## 2018-11-18 NOTE — ED Notes (Signed)
Critical lab, Lactic Acid 2.6, EDP notified

## 2018-11-18 NOTE — ED Triage Notes (Signed)
Pt has been vomitting blood since he woke unable to tell a time when it began and how many times until now that it has occurred. EMS informed that he vomited bright red blood 4 times in route here, pt denies dizziness.

## 2018-11-18 NOTE — Progress Notes (Signed)
MD notified of lactic acid level of 2.2

## 2018-11-18 NOTE — ED Notes (Signed)
Re-attempted to call report to 2W

## 2018-11-18 NOTE — ED Notes (Signed)
Called 2W in attempt to give report. 

## 2018-11-18 NOTE — H&P (Signed)
History and Physical    TATSUO MUSIAL ELF:[id number] DOB: [birth year] DOA: 11/18/2018  PCP: Josetta Huddle, MD Consultants:  Ninfa Linden - orthopedics; Caryl Comes - cardiology Patient coming from:  Home - lives with wife; [facility]: Wife, [phone]  Chief Complaint: hematemesis/hemoptysis  HPI: [name] is a 85 y.o. male with medical history significant of hyperthyroidism; HTN; HLD; DM; grade 1 diastolic CHF; and moderate aortic stenosis presenting with hematemesis/hemoptysis. He has had this problem 4-5 times, usually with pneumonia, last time in 7/19.  He was fine going to bed and awoke with vomiting phlegm and blood.  He thought tacos upset his stomach last night.  About 715, he had shaking chills and was spitting up phlegm and blood.  He just finished a 7-day course of antibiotics last Sunday, prescribed by Dr. Inda Merlin.  He had "a little fever" with EMS, wife is uncertain how high.  No SOB.  No home O2.  He has a chronic intermittent cough, unchanged until this AM.   ED Course:  Pneumonia - recently given antibiotics for "sinusitis."  This AM, reported vomiting blood but on questioning appears to be hemoptysis.  CXR with LUL PNA.  Febrile, lactate 2.5.  No hypotension.  Small amount IVF, given Rocephin, Azithromycin, 2L O2.  Review of Systems: As per HPI; otherwise review of systems reviewed and negative.   Ambulatory Status:  Ambulates without assistance  Past Medical History:  Diagnosis Date  . Aortic stenosis    Mild LVH, EF 60-65, normal wall motion, grade 1 diastolic dysfunction, mild aortic stenosis (mean 12/peak 22), trivial MR, normal RVSF, PASP 47 (mild pulmonary hypertension) // Echo 3/19: mild LVH, EF 60-65, no RWMA, Gr 1 DD, mild AS (mean 12, peak 20), Ao root mildly dilated (39 mm), Asc Aorta 38 mm, MAC, trivial MR   . Atherosclerosis of aorta ([place]) 12/16/2016   CXR 6/16: IMPRESSION: 1. Resolved left basilar airspace opacities. 2. Hazy nodularity in both lungs compatible with the  patient's known pleural plaques. 3. Right anterior hemidiaphragmatic eventration. 4. Thoracic spondylosis. 5. Atherosclerotic calcification of the aortic arch.  . Bradycardia    Low HR (30s) noted during sleep during admx for sepsis in 2016; Mobitz 1, 2:1 block >> no indication for pacer; management of OSA recommended   . Chronic diastolic CHF (congestive heart failure) ([place]) 11/18/2018  . Diabetes mellitus without complication ([place])   . Diabetic neuropathy ([place])   . DJD (degenerative joint disease)    knee, hip  . GERD (gastroesophageal reflux disease)   . Heart murmur   . History of echocardiogram    a. Echo 6/16: mod LVH, EF 65-70, vigorous LVF, no RWMA, mild MR, mild LAE  . HLD (hyperlipidemia)   . Hypertension   . Hyperthyroidism   . Phlebitis of left leg   . Pneumonia 02/2015   admx with sepsis     Past Surgical History:  Procedure Laterality Date  . CATARACT EXTRACTION Right   . CIRCUMCISION    . FRACTURE SURGERY    . TOTAL HIP ARTHROPLASTY Left 09/24/2017   Procedure: LEFT TOTAL HIP ARTHROPLASTY ANTERIOR APPROACH;  Surgeon: Mcarthur Rossetti, MD;  Location: [facility];  Service: Orthopedics;  Laterality: Left;    Social History   Socioeconomic History  . Marital status: Married    Spouse name: Not on file  . Number of children: Not on file  . Years of education: Not on file  . Highest education level: Not on file  Occupational History  . Occupation:  retired  Scientific laboratory technician  . Financial resource strain: Not on file  . Food insecurity:    Worry: Not on file    Inability: Not on file  . Transportation needs:    Medical: Not on file    Non-medical: Not on file  Tobacco Use  . Smoking status: Former Smoker    Last attempt to quit: 1960    Years since quitting: 60.2  . Smokeless tobacco: Never Used  Substance and Sexual Activity  . Alcohol use: No  . Drug use: No  . Sexual activity: Not on file  Lifestyle  . Physical activity:    Days per week: Not on file     Minutes per session: Not on file  . Stress: Not on file  Relationships  . Social connections:    Talks on phone: Not on file    Gets together: Not on file    Attends religious service: Not on file    Active member of club or organization: Not on file    Attends meetings of clubs or organizations: Not on file    Relationship status: Not on file  . Intimate partner violence:    Fear of current or ex partner: Not on file    Emotionally abused: Not on file    Physically abused: Not on file    Forced sexual activity: Not on file  Other Topics Concern  . Not on file  Social History Narrative  . Not on file    Allergies  Allergen Reactions  . Actos [Pioglitazone] Swelling  . Formaldehyde Other (See Comments)    Flu-like symptoms    Family History  Problem Relation Age of Onset  . Diabetes Mother   . Diabetes Sister   . Diabetes Brother   . Heart attack Neg Hx   . Stroke Neg Hx   . Hypertension Neg Hx     Prior to Admission medications   Medication Sig Start Date End Date Taking? Authorizing Provider  albuterol (PROVENTIL HFA;VENTOLIN HFA) 108 (90 Base) MCG/ACT inhaler Inhale 2 puffs into the lungs every 6 (six) hours as needed for wheezing or shortness of breath. 03/30/18  Yes Emokpae, Courage, MD  aspirin EC 81 MG tablet Take 81 mg by mouth daily.   Yes [provider]  Calcium Carbonate-Vitamin D (CALCIUM 600+D PO) Take 1 tablet by mouth daily.   Yes [provider]  gabapentin (NEURONTIN) 100 MG capsule Take 300 mg by mouth at bedtime.    Yes [provider]  insulin aspart (NOVOLOG FLEXPEN) 100 UNIT/ML FlexPen Before each meal 3 times a day, 140-199 - 2 units, 200-250 - 4 units, 251-299 - 6 units,  300-349 - 8 units,  350 or above 10 units. 02/28/15  Yes Florencia Reasons, MD  Insulin Glargine (LANTUS) 100 UNIT/ML Solostar Pen Inject 15 Units into the skin daily at 10 pm.   Yes [provider]  lisinopril (PRINIVIL,ZESTRIL) 5 MG tablet Take 5 mg by  mouth daily. 10/24/18  Yes [provider]  Multiple Vitamins-Minerals (MULTIVITAMIN PO) Take 1 tablet by mouth daily. Centrum Silver   Yes [provider]  omeprazole (PRILOSEC) 20 MG capsule Take 20 mg by mouth daily as needed (reflux).  10/20/16  Yes [provider]  rosuvastatin (CRESTOR) 10 MG tablet Take 10 mg by mouth at bedtime. 10/24/18  Yes [provider]  senna-docusate (SENOKOT-S) 8.6-50 MG tablet Take 2 tablets by mouth at bedtime. Patient taking differently: Take 1 tablet by mouth  at bedtime.  03/30/18 03/30/19 Yes Emokpae, Courage, MD  tetrahydrozoline-zinc (VISINE-AC) 0.05-0.25 % ophthalmic solution Place 2 drops into both eyes 3 (three) times daily as needed (dry eyes).   Yes [provider]  ACCU-CHEK AVIVA PLUS test strip  07/30/17   [provider]  Alcohol Swabs (B-D SINGLE USE SWABS REGULAR) PADS  07/12/17   [provider]  methocarbamol (ROBAXIN) 500 MG tablet Take 1 tablet (500 mg total) by mouth every 8 (eight) hours as needed for muscle spasms. Patient not taking: Reported on 11/18/2018 03/30/18   Roxan Hockey, MD  rivaroxaban (XARELTO) 20 MG TABS tablet 52m BID x 3 weeks, then  285mQd for 3 MONTHS Patient not taking: Reported on 03/27/2018 10/11/17   BlMcarthur RossettiMD    Physical Exam: Vitals:   11/18/18 0901 11/18/18 0908 11/18/18 0915 11/18/18 1139  BP:   (!) 133/43   Pulse:   (!) 57 91  Resp:   (!) 24 (!) 23  Temp:  (!) 102.9 F (39.4 C)    TempSrc:  Rectal    SpO2:   94% 97%  Weight: 86.2 kg     Height: _0  (1.702 m)        . General:  Appears calm and comfortable and is NAD . Eyes:  PERRL, EOMI, normal lids, iris . ENT:  grossly normal hearing, lips & tongue, mmm; mostly absent dentition . Neck:  no LAD, masses or thyromegaly . Cardiovascular:  RRR, no r/g, 3/6 systolic murmur. No LE edema.  . Marland Kitchenespiratory:   LLL rhonchi.  Normal respiratory effort. . Abdomen:  soft, NT, ND,  NABS . Back:   normal alignment, no CVAT . Skin:  no rash or induration seen on limited exam . Musculoskeletal:  grossly normal tone BUE/BLE, good ROM, no bony abnormality . Psychiatric:  grossly normal mood and affect, speech fluent and appropriate, AOx3 . Neurologic:  CN 2-12 grossly intact, moves all extremities in coordinated fashion, sensation intact    Radiological Exams on Admission: Dg Chest 2 View  Result Date: 11/18/2018 CLINICAL DATA:  Hematemesis that began this morning. Shortness of breath. EXAM: CHEST - 2 VIEW COMPARISON:  03/27/2018 and earlier, including CT chest 03/19/2008. FINDINGS: AP ERECT and LATERAL images were obtained. Cardiac silhouette normal in size for AP technique, unchanged. Thoracic aorta atherosclerotic, unchanged. Hilar and mediastinal contours otherwise unremarkable. 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 Airspace consolidation involving the LEFT UPPER LOBE. Lungs otherwise clear. Pulmonary vascularity normal. No pleural effusions. Degenerative changes and DISH involving the thoracic spine. Degenerative changes involving the RIGHT shoulder joint and calcific RIGHT supraspinatus tendinitis. IMPRESSION: Acute pneumonia involving the LEFT UPPER LOBE. Electronically Signed   By: ThEvangeline Dakin.D.   On: 11/18/2018 10:30    EKG: Unable to review in Epic; per Dr. GoRegenia SkeeterNSR at rate 58 with nonspecific ST changes and no evidence of acute ischemia   Labs on Admission: I have personally reviewed the available labs and imaging studies at the time of the admission.  Pertinent labs:   Glucose 208 BUN 13/Creatinine 1.32/GFR 48 BNP 205.0 Troponin <0.03 Lactate 2.6 WBC 10.8 INR 1.0 UA: 50 glucose  Assessment/Plan Principal Problem:   [facility]:   Acute respiratory failure with hypoxia (HCC)   CAP (community acquired pneumonia)   Type 2 diabetes mellitus with hyperglycemia ([place]  Aortic  stenosis   Essential hypertension   Dyslipidemia   Chronic diastolic CHF (congestive heart  failure) ([place])   Sepsis due to CAP with acute hypoxic respiratory failure -SIRS criteria in this patient includes: Leukocytosis, fever, tachypnea, hypoxia -Patient has evidence of acute organ failure with elevated lactate -While awaiting blood cultures, this appears to be a preseptic condition. -Sepsis protocol initiated -Suspected source is PNA -Given productive cough with hemoptysis, fever, mildly decreased oxygen saturation, and infiltrate on chest x-ray, most likely community-acquired pneumonia.  -Influenza pending -CURB-65 score is 1 - will admit the patient to Med Surg. -Pneumonia Severity Index (PSI) is Class 4, 9% mortality. -Corticosteroids have been to shown to low overall mortality rate; risk of ARDS; and need for mechanical ventilation.  This is particularly true in severe PNA (class 3+ PSI).  Will add 40 mg prednisone daily for 5 days. -Will start Azithromycin 500 mg daily AND Rocephin due to no risk factors for MDR cause. -Additional complicating factors include: hypoxia/hypoxemia (wearing 3L [place] O2 and mouth breathing while sleeping with O2 sats dropping into low 80s despite good pleth); failure of outpatient antibiotics (completed 7 day course of unknown medication last Sunday) -NS @ 75cc/hr -Fever control -Repeat CBC in am -Sputum cultures -Blood cultures -Strep pneumo testing -Will order lower respiratory tract procalcitonin level.  Antibiotics would not be indicated for PCT <0.1 and probably should not be used for < 0.25.  >0.5 indicates infection and >>0.5 indicates more serious disease.  As the procalcitonin level normalizes, it will be reasonable to consider de-escalation of antibiotic coverage. -Will trend lactate to ensure improvement  DM -Continue Lantus -Cover with moderate-scale SSI  HTN -Hold Lisinopril for now given low-normal BPs  Aortic stenosis -Previously  mild -Due for repeat echo in 3/20  HLD -Continue Crestor  Chronic diastolic CHF -Appears to be compensated at this time -Grade 1 diastolic dysfunction with preserved EF in 7/19  DVT prophylaxis:  SCDs given hemoptysis Code Status:  DNR - confirmed with patient/family Family Communication: Wife present throughout evaluation  Disposition Plan:  Home once clinically improved Consults called: None  Admission status: Admit - It is my clinical opinion that admission to INPATIENT is reasonable and necessary because of the expectation that this patient will require hospital care that crosses at least 2 midnights to treat this condition based on the medical complexity of the problems presented.  Given the aforementioned information, the predictability of an adverse outcome is felt to be significant.    Karmen Bongo MD [facility]   How to contact the [facility] Attending or Consulting provider [place] or covering provider during after hours [place], for this patient?  1. Check the care team in [facility] and look for a) attending/consulting TR[facility] provider listed and b) the [facility] team listed 2. Log into [email or web address] and use [place]'s universal password to access. If you do not have the password, please contact the hospital operator. 3. Locate the [facility] provider you are looking for under [facility] and page to a number that you can be directly reached. 4. If you still have difficulty reaching the provider, please page the [facility] (Director on Call) for the Hospitalists listed on amion for assistance.   11/18/2018, 12:22 PM

## 2018-11-19 DIAGNOSIS — I10 Essential (primary) hypertension: Secondary | ICD-10-CM

## 2018-11-19 DIAGNOSIS — J9601 Acute respiratory failure with hypoxia: Secondary | ICD-10-CM

## 2018-11-19 DIAGNOSIS — E785 Hyperlipidemia, unspecified: Secondary | ICD-10-CM

## 2018-11-19 DIAGNOSIS — E1165 Type 2 diabetes mellitus with hyperglycemia: Secondary | ICD-10-CM

## 2018-11-19 DIAGNOSIS — Z794 Long term (current) use of insulin: Secondary | ICD-10-CM

## 2018-11-19 LAB — CBC
HCT: 36.2 % — ABNORMAL LOW (ref 39.0–52.0)
Hemoglobin: 11.3 g/dL — ABNORMAL LOW (ref 13.0–17.0)
MCH: 29 pg (ref 26.0–34.0)
MCHC: 31.2 g/dL (ref 30.0–36.0)
MCV: 92.8 fL (ref 80.0–100.0)
Platelets: 183 10*3/uL (ref 150–400)
RBC: 3.9 MIL/uL — ABNORMAL LOW (ref 4.22–5.81)
RDW: 13 % (ref 11.5–15.5)
WBC: 12.2 10*3/uL — ABNORMAL HIGH (ref 4.0–10.5)
nRBC: 0 % (ref 0.0–0.2)

## 2018-11-19 LAB — BASIC METABOLIC PANEL
Anion gap: 10 (ref 5–15)
BUN: 22 mg/dL (ref 8–23)
CO2: 24 mmol/L (ref 22–32)
Calcium: 8.7 mg/dL — ABNORMAL LOW (ref 8.9–10.3)
Chloride: 101 mmol/L (ref 98–111)
Creatinine, Ser: 1.31 mg/dL — ABNORMAL HIGH (ref 0.61–1.24)
GFR calc Af Amer: 56 mL/min — ABNORMAL LOW (ref >60)
GFR calc non Af Amer: 48 mL/min — ABNORMAL LOW (ref >60)
Glucose, Bld: 338 mg/dL — ABNORMAL HIGH (ref 70–99)
Potassium: 4.4 mmol/L (ref 3.5–5.1)
Sodium: 135 mmol/L (ref 135–145)

## 2018-11-19 LAB — URINE CULTURE: Culture: NO GROWTH

## 2018-11-19 LAB — GLUCOSE, CAPILLARY
Glucose-Capillary: 213 mg/dL — ABNORMAL HIGH (ref 70–99)
Glucose-Capillary: 258 mg/dL — ABNORMAL HIGH (ref 70–99)
Glucose-Capillary: 369 mg/dL — ABNORMAL HIGH (ref 70–99)
Glucose-Capillary: 294 mg/dL — ABNORMAL HIGH (ref 70–99)

## 2018-11-19 MED ORDER — OXYMETAZOLINE HCL 0.05 % NA SOLN
1.0000 | Freq: Two times a day (BID) | NASAL | Status: DC
  Administered 2018-11-19: 1 via NASAL
  Filled 2018-11-19: qty 30

## 2018-11-19 MED ORDER — FLUTICASONE PROPIONATE 50 MCG/ACT NA SUSP
2.0000 | Freq: Two times a day (BID) | NASAL | Status: DC
  Administered 2018-11-19 (×2): 2 via NASAL
  Filled 2018-11-19: qty 16

## 2018-11-19 MED ORDER — AZITHROMYCIN 250 MG PO TABS: 500.0000 mg | ORAL_TABLET | Freq: Every day | ORAL | Status: DC

## 2018-11-19 MED ORDER — SALINE SPRAY 0.65 % NA SOLN
1.0000 | NASAL | Status: DC | PRN
  Administered 2018-11-19: 1 via NASAL
  Filled 2018-11-19: qty 44

## 2018-11-19 MED ORDER — BISACODYL 10 MG RE SUPP
10.0000 mg | Freq: Every day | RECTAL | Status: DC | PRN
  Administered 2018-11-19: 10 mg via RECTAL
  Filled 2018-11-19: qty 1

## 2018-11-19 NOTE — Progress Notes (Signed)
PROGRESS NOTE    [place] [name]  TOI:[id number] DOB: 06-08-1929 DOA: 11/18/2018 PCP: Marden Noble, MD    Brief Narrative:  85 year old male who presented with hematemesis and hemoptysis.  He does have significant past medical history for hypothyroidism, hypertension, dyslipidemia, type 2 diabetes mellitus, diastolic heart failure, and aortic stenosis.  Apparently patient had a sudden episode of hematemesis, associated with shaking chills and productive cough with hemoptysis.  Apparently he just finished a 7-day course of antibiotic therapy prescribed but his primary care physician.  On his initial physical examination blood pressure 133/43, heart rate 57, respiratory rate 24, temperature 39.4 C, oxygen saturation 94%.  Patient was awake and alert, heart S1-S2 present, rhythmic, 3 out of 6 systolic murmur, lungs with rhonchi in the left lower lobe, abdomen soft, no lower extremity edema.  Sodium 138, potassium 4.3, chloride 100, bicarb 25, glucose 208, BUN 13, creatinine 1.32, white count 10.8, hemoglobin 13.7, hematocrit 44.0, platelets 215.  Influenza a and B were negative.  Urinalysis negative.  His chest x-ray had a left upper lobe infiltrate.  His EKG had a first-degree AV block, left axis deviation, poor R wave progression, lateral ST depressions, rate 58 bpm.   Patient was admitted to the hospital with the working diagnosis of left upper lobe community acquired pneumonia, ( present on admission), complicated with sepsis.    Assessment & Plan:   Principal Problem:   Sepsis due to pneumonia [facility]) Active Problems:   Acute respiratory failure with hypoxia (HCC)   CAP (community acquired pneumonia)   Type 2 diabetes mellitus with hyperglycemia (HCC)   Aortic stenosis   Essential hypertension   Dyslipidemia   Chronic diastolic CHF (congestive heart failure) (HCC)   1. Left upper lobe community acquired pneumonia, (present on admission), complicated with sepsis and acute hypoxic  respiratory failure. Patient clinically is improving, but not yet back to baseline. Will continue antibiotic therapy for now with IV ceftriaxone and po azithromycin. Apparently patient took 7 days of antibiotic therapy before admission, no information on EHR. Will contact patient's pharmacy for further information. Oxygen saturation is 100 on 2 LPN nasal canula, will wean off to room air as tolerated. Out of bed to the chair and ambulate as tolerated.   2. COPD exacerbation/ acute on chronic bronchitis. No PFT in the EHR, but patient is on bronchodilators at home with a recent exacerbation about 6 mo ago. Will continue bronchodilator therapy, systemic steroids and supplemental 02 per [place]. Will need outpatient PFT.   3. Acute on chronic sinusitis. Will add fluticasone nasal spry, hypertonic saline and afrin. No further hemoptysis.   4. Diastolic heart failure with aortic stenosis. Seems to be stable with no signs of exacerbation.   5. T2DM. Continue glucose cover and monitoring with insulin sliding scale. Continue basal insulin with glargine 15 units daily.   6. HTN with dyslipidemia. Holding lisinopril due to AKI, continue statin therapy.   7. AKI on CKD stage 2. Will continue to follow on renal function and electrolytes, will hold on nephrotoxic medications or hypotension.   DVT prophylaxis: enoxaparin   Code Status:  dnr  Family Communication: I spoke with patient's wife at the bedside and all questions were addressed.  Disposition Plan/ discharge barriers: pending clinical improvement, possible in 24 H.   Body mass index is 29.76 kg/m. Malnutrition Type:      Malnutrition Characteristics:      Nutrition Interventions:     RN Pressure Injury Documentation:     Consultants:  Procedures:     Antimicrobials:       Subjective: Patient reports improvement in his symptoms but continue to have nasal congestion, cough and dyspnea. At home uses bronchodilators as  needed, had pneumonia/ bronchitis about 6 mo ago.   Objective: Vitals:   11/18/18 1423 11/18/18 1640 11/18/18 2338 11/19/18 0809  BP: (!) 128/58 (!) 151/60 (!) 144/51 (!) 156/54  Pulse: 69 69 63 (!) 107  Resp:  16 18   Temp: 98 F (36.7 C) 98.1 F (36.7 C) (!) 97.5 F (36.4 C) 97.7 F (36.5 C)  TempSrc: Oral Oral Oral Oral  SpO2: 100% 97% 98% 100%  Weight:      Height:        Intake/Output Summary (Last 24 hours) at 11/19/2018 1245 Last data filed at 11/19/2018 1000 Gross per 24 hour  Intake 1034 ml  Output 502 ml  Net 532 ml   Filed Weights   11/18/18 0901  Weight: 86.2 kg    Examination:   General: deconditioned Neurology: Awake and alert, non focal  E ENT: mild pallor, no icterus, oral mucosa moist Cardiovascular: No JVD. S1-S2 present, rhythmic, no gallops, rubs, or murmurs. No lower extremity edema. Pulmonary: positive breath sounds bilaterally, decreased air movement, no wheezing, positive bilateral rhonchi and rales. Gastrointestinal. Abdomen with no organomegaly, non tender, no rebound or guarding Skin. No rashes Musculoskeletal: no joint deformities     Data Reviewed: I have personally reviewed following labs and imaging studies  CBC: Recent Labs  Lab 11/18/18 0907 11/19/18 0244  WBC 10.8* 12.2*  NEUTROABS 8.9*  --   HGB 13.7 11.3*  HCT 44.0 36.2*  MCV 95.0 92.8  PLT 215 183   Basic Metabolic Panel: Recent Labs  Lab 11/18/18 0907 11/19/18 0244  NA 138 135  K 4.3 4.4  CL 100 101  CO2 25 24  GLUCOSE 208* 338*  BUN 13 22  CREATININE 1.32* 1.31*  CALCIUM 9.3 8.7*   GFR: Estimated Creatinine Clearance: 40.9 mL/min (A) (by C-G formula based on SCr of 1.31 mg/dL (H)). Liver Function Tests: Recent Labs  Lab 11/18/18 0907  AST 17  ALT 15  ALKPHOS 55  BILITOT 0.6  PROT 6.2*  ALBUMIN 3.5   No results for input(s): LIPASE, AMYLASE in the last 168 hours. No results for input(s): AMMONIA in the last 168 hours. Coagulation  Profile: Recent Labs  Lab 11/18/18 0907  INR 1.0   Cardiac Enzymes: Recent Labs  Lab 11/18/18 0907  TROPONINI <0.03   BNP (last 3 results) No results for input(s): PROBNP in the last 8760 hours. HbA1C: Recent Labs    11/18/18 1457  HGBA1C 8.2*   CBG: Recent Labs  Lab 11/18/18 1504 11/18/18 1701 11/18/18 2111 11/19/18 0814 11/19/18 1229  GLUCAP 267* 402* 345* 213* 258*   Lipid Profile: No results for input(s): CHOL, HDL, LDLCALC, TRIG, CHOLHDL, LDLDIRECT in the last 72 hours. Thyroid Function Tests: No results for input(s): TSH, T4TOTAL, FREET4, T3FREE, THYROIDAB in the last 72 hours. Anemia Panel: No results for input(s): VITAMINB12, FOLATE, FERRITIN, TIBC, IRON, RETICCTPCT in the last 72 hours.    Radiology Studies: I have reviewed all of the imaging during this hospital visit personally     Scheduled Meds: . aspirin EC  81 mg Oral Daily  . gabapentin  300 mg Oral QHS  . insulin aspart  0-15 Units Subcutaneous TID WC  . insulin aspart  0-5 Units Subcutaneous QHS  . insulin glargine  15 Units Subcutaneous Q2200  .  predniSONE  40 mg Oral Q breakfast  . rosuvastatin  10 mg Oral QHS  . senna-docusate  1 tablet Oral QHS  . sodium chloride flush  3 mL Intravenous Q12H   Continuous Infusions: . sodium chloride 75 mL/hr at 11/19/18 0205  . azithromycin 500 mg (11/19/18 1226)  . cefTRIAXone (ROCEPHIN)  IV 2 g (11/19/18 0958)     LOS: 1 day        Miki Labuda Annett Gula, MD

## 2018-11-19 NOTE — Progress Notes (Signed)
Patient during the shift has experienced some changes in heart rhythm, Patient has been Afib, NSR with heart bloc, Wenkebach and Type 2 Mobitz. X.Blount, NP was paged several times to inform of patient's status. EKG noted NSR with complete heart block. Patient asymptomatic during these episodes. X.Blount,NP informed me (Lateshia Schmoker,RN) to continue to monitor and call with any changes

## 2018-11-20 DIAGNOSIS — I350 Nonrheumatic aortic (valve) stenosis: Secondary | ICD-10-CM

## 2018-11-20 DIAGNOSIS — E1122 Type 2 diabetes mellitus with diabetic chronic kidney disease: Secondary | ICD-10-CM

## 2018-11-20 DIAGNOSIS — N182 Chronic kidney disease, stage 2 (mild): Secondary | ICD-10-CM

## 2018-11-20 LAB — BASIC METABOLIC PANEL
Anion gap: 8 (ref 5–15)
BUN: 16 mg/dL (ref 8–23)
CO2: 28 mmol/L (ref 22–32)
Calcium: 9.8 mg/dL (ref 8.9–10.3)
Chloride: 103 mmol/L (ref 98–111)
Creatinine, Ser: 1.13 mg/dL (ref 0.61–1.24)
GFR calc Af Amer: >60 mL/min (ref >60)
GFR calc non Af Amer: 58 mL/min — ABNORMAL LOW (ref >60)
GLUCOSE: 198 mg/dL — AB (ref 70–99)
Potassium: 4.3 mmol/L (ref 3.5–5.1)
Sodium: 139 mmol/L (ref 135–145)

## 2018-11-20 LAB — GLUCOSE, CAPILLARY: Glucose-Capillary: 148 mg/dL — ABNORMAL HIGH (ref 70–99)

## 2018-11-20 MED ORDER — OXYMETAZOLINE HCL 0.05 % NA SOLN: 1.0000 | Freq: Two times a day (BID) | NASAL | 0 refills | Status: AC

## 2018-11-20 MED ORDER — AMOXICILLIN-POT CLAVULANATE 875-125 MG PO TABS: 1.0000 | ORAL_TABLET | Freq: Two times a day (BID) | ORAL | 0 refills | Status: AC

## 2018-11-20 MED ORDER — PREDNISONE 20 MG PO TABS: 40.0000 mg | ORAL_TABLET | Freq: Every day | ORAL | 0 refills | Status: AC

## 2018-11-20 MED ORDER — SALINE SPRAY 0.65 % NA SOLN: 1.0000 | NASAL | 0 refills | Status: DC | PRN

## 2018-11-20 MED ORDER — FLUTICASONE PROPIONATE 50 MCG/ACT NA SUSP: 2.0000 | Freq: Two times a day (BID) | NASAL | 0 refills | Status: AC

## 2018-11-20 MED ORDER — AMOXICILLIN-POT CLAVULANATE 875-125 MG PO TABS: 1.0000 | ORAL_TABLET | Freq: Two times a day (BID) | ORAL | Status: DC

## 2018-11-20 NOTE — Discharge Summary (Signed)
Physician Discharge Summary  Jackson [name] WUJ:[id number] DOB: 1929/06/27 DOA: 11/18/2018  PCP: Marden NobleGates, Robert, MD  Admit date: 11/18/2018 Discharge date: 11/20/2018  Admitted From: Home  Disposition:   Home   Recommendations for Outpatient Follow-up and new medication changes:  1. Follow up with Dr. Kevan NyGates in 7 days.  2. Patient will continue using flonase bid, 2 more days of afrin and saline nasal spry as needed. 3. Continue as needed albuterol. 4. Prednisone for a total of 5 days.  5. Recommend full pulmonary function testing as outpatient.  6. Will order 5 more days of oral Augmentin.   Home Health: no   Equipment/Devices: no    Discharge Condition: stable  CODE STATUS: full   Diet recommendation: Heart healthy and diabetic prudent.    Brief/Interim Summary: 85 year old male who presented with hematemesis and hemoptysis.  He does have significant past medical history for hypothyroidism, hypertension, dyslipidemia, type 2 diabetes mellitus, diastolic heart failure, and aortic stenosis.  Apparently patient had a sudden episode of hematemesis, associated with shaking chills and productive cough with hemoptysis.  Apparently he just finished a 7-day course of antibiotic therapy prescribed but his primary care physician.  On his initial physical examination blood pressure 133/43, heart rate 57, respiratory rate 24, temperature 39.4 C, oxygen saturation 94%.  Patient was awake and alert, heart S1-S2 present, rhythmic, 3 out of 6 systolic murmur, lungs with rhonchi in the left lower lobe, abdomen soft, no lower extremity edema.  Sodium 138, potassium 4.3, chloride 100, bicarb 25, glucose 208, BUN 13, creatinine 1.32, white count 10.8, hemoglobin 13.7, hematocrit 44.0, platelets 215.  Influenza a and B were negative.  Urinalysis negative.  His chest x-ray had a left upper lobe infiltrate.  His EKG had a first-degree AV block, left axis deviation, poor R wave progression, lateral ST depressions, rate  58 bpm.   Patient was admitted to the hospital with the working diagnosis of left upper lobe community acquired pneumonia, ( present on admission), complicated with sepsis.   1. Left upper lobe community acquired pneumonia, (present on admission) complicated with sepsis and acute hypoxic respiratory failure.  Patient was admitted to the ward, he was placed on remote telemetry monitoring, received IV antibiotic therapy with IV ceftriaxone and p.o. azithromycin.  He received bronchodilator therapy, systemic steroids and supplemental oxygen per nasal cannula.  His symptoms improved, no further dyspnea, coughing or hemoptysis.  His discharge oxygen saturation 97% on room air.  I called [facility], apparently patient had doxycycline prescribed, for 7 days (from the 17th the 24th).  Patient continue 5 more days of Augmentin.  His procalcitonin was 3.1  2.  Acute on chronic bronchitis, suspected COPD.  Patient was placed on bronchodilator therapy and systemic steroids with significant improvement of symptoms.  Will recommend outpatient pulmonary function testing.  Continue as needed albuterol for now.  Will prescribe short course of prednisone.  3.  Acute on chronic sinusitis.  Patient complained of severe sinus congestion, he received nasal fluticasone, Afrin and nasal saline with improvement of his symptoms.  He will continue taking fluticasone twice daily, affrin only for 2 more days and continue saline sprays.  4.  Diastolic heart failure with aortic stenosis.  No signs of decompensation, patient will continue blood pressure control with lisinopril.  5.  Type 2 diabetes mellitus.  Patient was placed on insulin sliding scale for glucose coverage monitoring along with basal long-acting insulin.  Patient tolerated p.o. diet adequately. His fasting glucose at discharge is  198 mg/dl.   6.  Acute kidney injury on chronic kidney disease stage II.  Peak serum creatinine 1.31, at discharge is down to 1.13,  electrolytes are within normal range with potassium 4.3, chloride 103, sodium 139.  His serum bicarbonate was 28.   7.  Hypertension with dyslipidemia.  Lisinopril was held during his hospitalization due to acute kidney injury, at discharge he will resume antihypertensive regimen, continue statin therapy.  Discharge Diagnoses:  Principal Problem:   Sepsis due to pneumonia [facility]) Active Problems:   Acute respiratory failure with hypoxia (HCC)   CAP (community acquired pneumonia)   Type 2 diabetes mellitus with hyperglycemia (HCC)   Aortic stenosis   Essential hypertension   Dyslipidemia   Chronic diastolic CHF (congestive heart failure) (HCC)    Discharge Instructions   Allergies as of 11/20/2018      Reactions   Actos [pioglitazone] Swelling   Formaldehyde Other (See Comments)   Flu-like symptoms      Medication List    TAKE these medications   ACCU-CHEK AVIVA PLUS test strip Generic drug:  glucose blood   albuterol 108 (90 Base) MCG/ACT inhaler Commonly known as:  PROVENTIL HFA;VENTOLIN HFA Inhale 2 puffs into the lungs every 6 (six) hours as needed for wheezing or shortness of breath.   amoxicillin-clavulanate 875-125 MG tablet Commonly known as:  AUGMENTIN Take 1 tablet by mouth every 12 (twelve) hours for 5 days.   aspirin EC 81 MG tablet Take 81 mg by mouth daily.   B-D SINGLE USE SWABS REGULAR Pads   CALCIUM 600+D PO Take 1 tablet by mouth daily.   fluticasone 50 MCG/ACT nasal spray Commonly known as:  FLONASE Place 2 sprays into both nostrils 2 (two) times daily.   gabapentin 100 MG capsule Commonly known as:  NEURONTIN Take 300 mg by mouth at bedtime.   insulin aspart 100 UNIT/ML FlexPen Commonly known as:  NOVOLOG FLEXPEN Before each meal 3 times a day, 140-199 - 2 units, 200-250 - 4 units, 251-299 - 6 units,  300-349 - 8 units,  350 or above 10 units.   Insulin Glargine 100 UNIT/ML Solostar Pen Commonly known as:  LANTUS Inject 15 Units into the  skin daily at 10 pm.   lisinopril 5 MG tablet Commonly known as:  PRINIVIL,ZESTRIL Take 5 mg by mouth daily.   MULTIVITAMIN PO Take 1 tablet by mouth daily. Centrum Silver   omeprazole 20 MG capsule Commonly known as:  PRILOSEC Take 20 mg by mouth daily as needed (reflux).   oxymetazoline 0.05 % nasal spray Commonly known as:  AFRIN Place 1 spray into both nostrils 2 (two) times daily for 2 days.   predniSONE 20 MG tablet Commonly known as:  DELTASONE Take 2 tablets (40 mg total) by mouth daily with breakfast for 3 days. Start taking on:  November 21, 2018   rosuvastatin 10 MG tablet Commonly known as:  CRESTOR Take 10 mg by mouth at bedtime.   senna-docusate 8.6-50 MG tablet Commonly known as:  Senokot-S Take 2 tablets by mouth at bedtime. What changed:  how much to take   sodium chloride 0.65 % Soln nasal spray Commonly known as:  OCEAN Place 1 spray into both nostrils as needed for congestion.   tetrahydrozoline-zinc 0.05-0.25 % ophthalmic solution Commonly known as:  VISINE-AC Place 2 drops into both eyes 3 (three) times daily as needed (dry eyes).       Allergies  Allergen Reactions  . Actos [Pioglitazone] Swelling  .  Formaldehyde Other (See Comments)    Flu-like symptoms    Consultations:     Procedures/Studies: Dg Chest 2 View  Result Date: 11/18/2018 CLINICAL DATA:  Hematemesis that began this morning. Shortness of breath. EXAM: CHEST - 2 VIEW COMPARISON:  03/27/2018 and earlier, including CT chest 03/19/2008. FINDINGS: AP ERECT and LATERAL images were obtained. Cardiac silhouette normal in size for AP technique, unchanged. Thoracic aorta atherosclerotic, unchanged. Hilar and mediastinal contours otherwise unremarkable. 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 Airspace consolidation involving the LEFT UPPER LOBE. Lungs otherwise clear. Pulmonary vascularity normal. No pleural effusions. Degenerative changes and DISH involving the  thoracic spine. Degenerative changes involving the RIGHT shoulder joint and calcific RIGHT supraspinatus tendinitis. IMPRESSION: Acute pneumonia involving the LEFT UPPER LOBE. Electronically Signed   By: Hulan Saas M.D.   On: 11/18/2018 10:30       Subjective: Patient is feeling better, dyspnea and sinus congestion have improved, no nausea or vomiting, no chest pain.   Discharge Exam: Vitals:   11/20/18 0634 11/20/18 0821  BP: (!) 179/60 (!) 192/68  Pulse:  (!) 53  Resp:  16  Temp:  97.7 F (36.5 C)  SpO2:  99%   Vitals:   11/19/18 2100 11/19/18 2312 11/20/18 0634 11/20/18 0821  BP:  (!) 187/67 (!) 179/60 (!) 192/68  Pulse:  62  (!) 53  Resp:  20  16  Temp:  97.7 F (36.5 C)  97.7 F (36.5 C)  TempSrc:  Oral  Oral  SpO2: 100% 97%  99%  Weight:      Height:        General: Not in pain or dyspnea.  Neurology: Awake and alert, non focal  E ENT: no pallor, no icterus, oral mucosa moist Cardiovascular: No JVD. S1-S2 present, rhythmic, no gallops, rubs, or murmurs. No lower extremity edema. Pulmonary: positive breath sounds bilaterally, no wheezing, or rhonchi. Positive rales at the left lung. Gastrointestinal. Abdomen with no organomegaly, non tender, no rebound or guarding Skin. No rashes Musculoskeletal: no joint deformities   The results of significant diagnostics from this hospitalization (including imaging, microbiology, ancillary and laboratory) are listed below for reference.     Microbiology: Recent Results (from the past 240 hour(s))  Blood Culture (routine x 2)     Status: None (Preliminary result)   Collection Time: 11/18/18  9:21 AM  Result Value Ref Range Status   Specimen Description BLOOD RIGHT ANTECUBITAL  Final   Special Requests   Final    BOTTLES DRAWN AEROBIC AND ANAEROBIC Blood Culture adequate volume   Culture   Final    NO GROWTH 1 DAY Performed at [facility], [address] [place], [place] [address]    Report Status PENDING   Incomplete  Blood Culture (routine x 2)     Status: None (Preliminary result)   Collection Time: 11/18/18  9:25 AM  Result Value Ref Range Status   Specimen Description BLOOD LEFT ANTECUBITAL  Final   Special Requests   Final    BOTTLES DRAWN AEROBIC AND ANAEROBIC Blood Culture adequate volume   Culture   Final    NO GROWTH 1 DAY Performed at [facility], [address] [place], [place] [address]    Report Status PENDING  Incomplete  Urine culture     Status: None   Collection Time: 11/18/18  9:53 AM  Result Value Ref Range Status   Specimen Description URINE, RANDOM  Final   Special Requests  NONE  Final   Culture   Final    NO GROWTH Performed at [facility], [address], [place], [place] [address]    Report Status 11/19/2018 FINAL  Final     Labs: BNP (last 3 results) Recent Labs    03/27/18 0730 03/27/18 1645 11/18/18 0907  BNP 223.8* 768.1* 205.0*   Basic Metabolic Panel: Recent Labs  Lab 11/18/18 0907 11/19/18 0244 11/20/18 0218  NA 138 135 139  K 4.3 4.4 4.3  CL 100 101 103  CO2 GLUCOSE 208* 338* 198*  BUN CREATININE 1.32* 1.31* 1.13  CALCIUM 9.3 8.7* 9.8   Liver Function Tests: Recent Labs  Lab 11/18/18 0907  AST 17  ALT 15  ALKPHOS 55  BILITOT 0.6  PROT 6.2*  ALBUMIN 3.5   No results for input(s): LIPASE, AMYLASE in the last 168 hours. No results for input(s): AMMONIA in the last 168 hours. CBC: Recent Labs  Lab 11/18/18 0907 11/19/18 0244  WBC 10.8* 12.2*  NEUTROABS 8.9*  --   HGB 13.7 11.3*  HCT 44.0 36.2*  MCV 95.0 92.8  PLT 215 183   Cardiac Enzymes: Recent Labs  Lab 11/18/18 0907  TROPONINI <0.03   BNP: Invalid input(s): POCBNP CBG: Recent Labs  Lab 11/19/18 0814 11/19/18 1229 11/19/18 1704 11/19/18 2101 11/20/18 0819  GLUCAP 213* 258* 369* 294* 148*   D-Dimer No results for input(s): DDIMER in the last 72 hours. Hgb A1c Recent Labs    11/18/18 1457  HGBA1C 8.2*    Lipid Profile No results for input(s): CHOL, HDL, LDLCALC, TRIG, CHOLHDL, LDLDIRECT in the last 72 hours. Thyroid function studies No results for input(s): TSH, T4TOTAL, T3FREE, THYROIDAB in the last 72 hours.  Invalid input(s): FREET3 Anemia work up No results for input(s): VITAMINB12, FOLATE, FERRITIN, TIBC, IRON, RETICCTPCT in the last 72 hours. Urinalysis    Component Value Date/Time   COLORURINE YELLOW 11/18/2018 0953   APPEARANCEUR CLEAR 11/18/2018 0953   LABSPEC 1.012 11/18/2018 0953   PHURINE 6.0 11/18/2018 0953   GLUCOSEU 50 (A) 11/18/2018 0953   HGBUR NEGATIVE 11/18/2018 0953   BILIRUBINUR NEGATIVE 11/18/2018 0953   KETONESUR NEGATIVE 11/18/2018 0953   PROTEINUR NEGATIVE 11/18/2018 0953   UROBILINOGEN 0.2 02/27/2015 1536   NITRITE NEGATIVE 11/18/2018 0953   LEUKOCYTESUR NEGATIVE 11/18/2018 0953   Sepsis Labs Invalid input(s): PROCALCITONIN,  WBC,  LACTICIDVEN Microbiology Recent Results (from the past 240 hour(s))  Blood Culture (routine x 2)     Status: None (Preliminary result)   Collection Time: 11/18/18  9:21 AM  Result Value Ref Range Status   Specimen Description BLOOD RIGHT ANTECUBITAL  Final   Special Requests   Final    BOTTLES DRAWN AEROBIC AND ANAEROBIC Blood Culture adequate volume   Culture   Final    NO GROWTH 1 DAY Performed at [facility], [address] [place], [place] [address]    Report Status PENDING  Incomplete  Blood Culture (routine x 2)     Status: None (Preliminary result)   Collection Time: 11/18/18  9:25 AM  Result Value Ref Range Status   Specimen Description BLOOD LEFT ANTECUBITAL  Final   Special Requests   Final    BOTTLES DRAWN AEROBIC AND ANAEROBIC Blood Culture adequate volume   Culture   Final    NO GROWTH 1 DAY Performed at [facility], [address] [place], [place] [address]  Report Status PENDING  Incomplete  Urine culture     Status: None   Collection Time: 11/18/18  9:53 AM  Result Value Ref  Range Status   Specimen Description URINE, RANDOM  Final   Special Requests NONE  Final   Culture   Final    NO GROWTH Performed at [facility], [address], [place], [place] [address]    Report Status 11/19/2018 FINAL  Final     Time coordinating discharge: 45 minutes  SIGNED:   Coralie Keens, MD  [facility] 11/20/2018, 10:22 AM

## 2018-11-20 NOTE — Plan of Care (Signed)
Pt doing well on room air throughout the night.  No sob, no signs of distress. Tolerating activity well.

## 2018-11-20 NOTE — Evaluation (Signed)
Physical Therapy Evaluation Patient Details Name: Jackson [name] MRN: [id number] DOB: 03/23/1929 Today's Date: 11/20/2018   History of Present Illness  Patient is an 85 yo male who presents with Left upper lobe community acquired pneumonia, (present on admission), complicated with sepsis and acute hypoxic respiratory failure.  Clinical Impression  Patient seen for therapy assessment.  Mobilizing well with modest SOB upon fatigue. Educated patient on pursed lip breathing and safety with mobility. Saturations to 88% on room air with ambulation. Patient for d/c home shortly and reports at his mobility baseline with modest DOE. No further acute PT needs. Will sign off.     Follow Up Recommendations Supervision for mobility/OOB    Equipment Recommendations  None recommended by PT    Recommendations for Other Services       Precautions / Restrictions Precautions Precautions: Fall Precaution Comments: watch HR and O2 saturations Restrictions Weight Bearing Restrictions: No      Mobility  Bed Mobility Overal bed mobility: Independent                Transfers Overall transfer level: Independent                  Ambulation/Gait Ambulation/Gait assistance: Supervision Gait Distance (Feet): 380 Feet Assistive device: None Gait Pattern/deviations: Festinating;Trunk flexed;Drifts right/left Gait velocity: decreased Gait velocity interpretation: 1.31 - 2.62 ft/sec, indicative of limited community ambulator General Gait Details: some noted instability during ambulation but patient able to self correct balance losses, no physical assist. Amb on room air, saturations to 88%, educated on pursed lip breathing  Stairs            Wheelchair Mobility    Modified Rankin (Stroke Patients Only)       Balance Overall balance assessment: Mild deficits observed, not formally tested                                           Pertinent Vitals/Pain Pain  Assessment: No/denies pain    Home Living Family/patient expects to be discharged to:: Private residence Living Arrangements: Spouse/significant other   Type of Home: House Home Access: Ramped entrance     Home Layout: Two level;Able to live on main level with bedroom/bathroom Home Equipment: Gilmer Mor - single point;Walker - 2 wheels;Bedside commode;Shower seat      Prior Function Level of Independence: Independent with assistive device(s)               Hand Dominance   Dominant Hand: Right    Extremity/Trunk Assessment   Upper Extremity Assessment Upper Extremity Assessment: Overall WFL for tasks assessed    Lower Extremity Assessment Lower Extremity Assessment: Overall WFL for tasks assessed(some decreased coordination BLE)       Communication   Communication: No difficulties  Cognition Arousal/Alertness: Awake/alert Behavior During Therapy: WFL for tasks assessed/performed Overall Cognitive Status: Within Functional Limits for tasks assessed                                        General Comments      Exercises     Assessment/Plan    PT Assessment Patent does not need any further PT services(d/c home shortly)  PT Problem List         PT Treatment Interventions  PT Goals (Current goals can be found in the Care Plan section)  Acute Rehab PT Goals Patient Stated Goal: to go home PT Goal Formulation: All assessment and education complete, DC therapy    Frequency     Barriers to discharge        Co-evaluation               AM-PAC PT "6 Clicks" Mobility  Outcome Measure Help needed turning from your back to your side while in a flat bed without using bedrails?: None Help needed moving from lying on your back to sitting on the side of a flat bed without using bedrails?: None Help needed moving to and from a bed to a chair (including a wheelchair)?: None Help needed standing up from a chair using your arms (e.g.,  wheelchair or bedside chair)?: None Help needed to walk in hospital room?: A Little Help needed climbing 3-5 steps with a railing? : A Little 6 Click Score: 22    End of Session Equipment Utilized During Treatment: Gait belt Activity Tolerance: Patient tolerated treatment well Patient left: in chair;with call bell/phone within reach;with family/visitor present Nurse Communication: Mobility status PT Visit Diagnosis: Unsteadiness on feet (R26.81)    Time: 5701-7793 PT Time Calculation (min) (ACUTE ONLY): 14 min   Charges:   PT Evaluation $PT Eval Low Complexity: 1 Low          Charlotte Crumb, PT DPT  Board Certified Neurologic Specialist Acute Rehabilitation Services Pager [phone] Office [phone]   Jackson [name] 11/20/2018, 12:54 PM

## 2018-11-23 LAB — CULTURE, BLOOD (ROUTINE X 2)
CULTURE: NO GROWTH
Culture: NO GROWTH
Special Requests: ADEQUATE
Special Requests: ADEQUATE

## 2018-11-28 DIAGNOSIS — R001 Bradycardia, unspecified: Secondary | ICD-10-CM | POA: Diagnosis not present

## 2018-11-28 DIAGNOSIS — N183 Chronic kidney disease, stage 3 (moderate): Secondary | ICD-10-CM | POA: Diagnosis not present

## 2018-11-28 DIAGNOSIS — E11319 Type 2 diabetes mellitus with unspecified diabetic retinopathy without macular edema: Secondary | ICD-10-CM | POA: Diagnosis not present

## 2018-11-28 DIAGNOSIS — E1140 Type 2 diabetes mellitus with diabetic neuropathy, unspecified: Secondary | ICD-10-CM | POA: Diagnosis not present

## 2018-11-28 DIAGNOSIS — K219 Gastro-esophageal reflux disease without esophagitis: Secondary | ICD-10-CM | POA: Diagnosis not present

## 2018-11-28 DIAGNOSIS — J189 Pneumonia, unspecified organism: Secondary | ICD-10-CM | POA: Diagnosis not present

## 2018-12-09 ENCOUNTER — Encounter: Payer: Self-pay | Admitting: Physician Assistant

## 2018-12-16 ENCOUNTER — Other Ambulatory Visit: Payer: Self-pay

## 2018-12-16 ENCOUNTER — Telehealth: Payer: Self-pay | Admitting: Cardiovascular Disease

## 2018-12-16 DIAGNOSIS — I10 Essential (primary) hypertension: Secondary | ICD-10-CM

## 2018-12-16 MED ORDER — LISINOPRIL 5 MG PO TABS: 5.0000 mg | ORAL_TABLET | Freq: Every day | ORAL | 3 refills | Status: DC

## 2018-12-16 NOTE — Telephone Encounter (Signed)
   Primary Cardiologist:  Sherryl Manges, MD   Patient contacted.  History reviewed.    I have called but the patient did not answer the phone and there was no option to leave a voice message.  The patient has mild aortic stenosis documented by previous echo.  He was scheduled for a 1 year repeat echocardiogram.  Given the recent coronavirus outbreak, I think it is reasonable to delay this echocardiogram for several months.  We will place him at a level 3 priority.  He will follow-up with Dr.Varanasi and Tereso Newcomer.  No symptoms to suggest any unstable cardiac conditions.  Based on discussion, with current pandemic situation, we will be postponing this appointment for Jackson [name] with a plan for f/u in 12 wks or sooner if feasible/necessary.  If symptoms change, he has been instructed to contact our office.   Routing to C19 CANCEL pool for tracking (P CV DIV CV19 CANCEL - reason for visit "other.") and   assigning priority (3 = >12 wks).   Kristeen Miss, MD  12/16/2018 4:36 PM         .

## 2018-12-18 ENCOUNTER — Telehealth: Payer: Self-pay | Admitting: Physician Assistant

## 2018-12-18 NOTE — Telephone Encounter (Signed)
Discussed changing OV on 3/31 to telehealth.  Patient is not having any new symptoms and is agreeable. He does not have a computer or smartphone.  PLAN:  1. Please call on Mon 3/30 to go over instructions and obtain consent for telephone visit on 3/31. Tereso Newcomer, PA-C    12/18/2018 3:43 PM

## 2018-12-19 ENCOUNTER — Other Ambulatory Visit: Payer: Self-pay

## 2018-12-19 ENCOUNTER — Ambulatory Visit (HOSPITAL_COMMUNITY): Payer: Medicare HMO | Attending: Cardiology

## 2018-12-19 DIAGNOSIS — I440 Atrioventricular block, first degree: Secondary | ICD-10-CM

## 2018-12-19 MED ORDER — ROSUVASTATIN CALCIUM 10 MG PO TABS: 10.0000 mg | ORAL_TABLET | Freq: Every day | ORAL | 1 refills | Status: DC

## 2018-12-19 NOTE — Telephone Encounter (Signed)
Pt was turned into a virtual office visit. Will delete out of the Covid 19 Cancel Pool.

## 2018-12-20 ENCOUNTER — Other Ambulatory Visit: Payer: Self-pay

## 2018-12-20 ENCOUNTER — Telehealth: Payer: Self-pay

## 2018-12-20 ENCOUNTER — Encounter: Payer: Self-pay | Admitting: Physician Assistant

## 2018-12-20 ENCOUNTER — Telehealth (INDEPENDENT_AMBULATORY_CARE_PROVIDER_SITE_OTHER): Payer: Medicare HMO | Admitting: Physician Assistant

## 2018-12-20 ENCOUNTER — Telehealth: Payer: Self-pay | Admitting: *Deleted

## 2018-12-20 VITALS — BP 121/72 | HR 60 | Ht 67.0 in | Wt 186.0 lb

## 2018-12-20 DIAGNOSIS — I442 Atrioventricular block, complete: Secondary | ICD-10-CM

## 2018-12-20 DIAGNOSIS — R7989 Other specified abnormal findings of blood chemistry: Secondary | ICD-10-CM

## 2018-12-20 DIAGNOSIS — R778 Other specified abnormalities of plasma proteins: Secondary | ICD-10-CM

## 2018-12-20 DIAGNOSIS — Z86718 Personal history of other venous thrombosis and embolism: Secondary | ICD-10-CM

## 2018-12-20 DIAGNOSIS — I10 Essential (primary) hypertension: Secondary | ICD-10-CM

## 2018-12-20 DIAGNOSIS — I700 Atherosclerosis of aorta: Secondary | ICD-10-CM

## 2018-12-20 DIAGNOSIS — I350 Nonrheumatic aortic (valve) stenosis: Secondary | ICD-10-CM

## 2018-12-20 NOTE — Telephone Encounter (Signed)
.  hcev 

## 2018-12-20 NOTE — Telephone Encounter (Signed)
    TELEPHONE CALL NOTE  Jackson [name] has been deemed a candidate for a follow-up tele-health visit to limit community exposure during the Covid-19 pandemic. I spoke with the patient via phone to ensure availability of phone/video source, confirm preferred email & phone number, and discuss instructions and expectations.  I reminded Jackson [name] to be prepared with any vital sign and/or heart rhythm information that could potentially be obtained via home monitoring, at the time of his visit. I reminded [name] to expect a phone call at the time of his visit if his visit.  Did the patient verbally acknowledge consent to treatment?  Park Liter, CMA 12/20/2018 12:03 PM

## 2018-12-20 NOTE — Patient Instructions (Signed)
Medication Instructions:  No changes.   If you need a refill on your cardiac medications before your next appointment, please call your pharmacy.   Lab work: None   If you have labs (blood work) drawn today and your tests are completely normal, you will receive your results only by: Marland Kitchen MyChart Message (if you have MyChart) OR . A paper copy in the mail If you have any lab test that is abnormal or we need to change your treatment, we will call you to review the results.  Testing/Procedures: None   Follow-Up: At [facility], you and your health needs are our priority.  As part of our continuing mission to provide you with exceptional heart care, we have created designated Provider Care Teams.  These Care Teams include your primary Cardiologist (physician) and Advanced Practice Providers (APPs -  Physician Assistants and Nurse Practitioners) who all work together to provide you with the care you need, when you need it. You will need a follow up appointment in:  6 months.  Please call our office 2 months in advance to schedule this appointment.  You may see Sherryl Manges, MD or Tereso Newcomer, PA-C  Any Other Special Instructions Will Be Listed Below (If Applicable).

## 2018-12-20 NOTE — Progress Notes (Signed)
Virtual Visit via Telephone Note    Evaluation Performed:  Follow-up visit  This visit type was conducted due to national recommendations for restrictions regarding the COVID-19 Pandemic (e.g. social distancing).  This format is felt to be most appropriate for this patient at this time.  All issues noted in this document were discussed and addressed.  No physical exam was performed (except for noted visual exam findings with Video Visits).  Please refer to the patient's chart (MyChart message for video visits and phone note for telephone visits) for the patient's consent to telehealth for [facility]  Date:  12/20/2018   ID:  Jackson [name], DOB [birth year], MRN [id number]  Patient Location:  Home  Provider location:   Home  PCP:  Josetta Huddle, MD  Cardiologist:  Virl Axe, MD / Richardson Dopp, PA-C  Electrophysiologist:  None   Chief Complaint: Follow-up on bradycardia, aortic stenosis  History of Present Illness:    Jackson [name] is a 85 y.o. male who presents via audio/video conferencing for a telehealth visit today.    He has mild aortic stenosis, first-degree AV block, aortic atherosclerosis, diabetes with peripheral neuropathy, hypertension, pulmonary hypertension.  During an admission in June 2016 for pneumonia and sepsis, he was noted to have bradycardia with transient Mobitz 1 and episodes of 2:1 AV block.  Bradycardia was noted to be during sleep.  He was evaluated by Dr. Caryl Comes and there were no indications for a pacemaker.  He had left total hip replacement in January 2019.  Postoperatively, he developed a DVT and was placed on Rivaroxaban.  He was last seen in March 2019.    He was admitted in July 2019 with acute hypoxic respiratory failure in the setting of probable community-acquired pneumonia and sepsis.  He did have minimally elevated troponin levels at that time without clear trend.  Echocardiogram demonstrated normal LV function.  Cardiology was not consulted.   He was admitted again in February 2020 with left upper lobe pneumonia.  Of note, there was one ECG during that admission that demonstrated complete heart block with a heart rate of 44.  Another ECG demonstrated sinus bradycardia with a heart rate of 58 with what appears to be Mobitz 1 AV block.  Today, he notes that he is feeling pretty good.  He has had some sinus congestion which contributes to poor performance of his hearing aids.  He has not had any chest pain or significant shortness of breath.  He has not had any significant lower extremity swelling.  He denies a history of syncope or near syncope.  He sometimes gets dizzy if he stands up too quickly.  He has not had any fevers, significant cough.  The patient does not have symptoms concerning for COVID-19 infection (fever, chills, cough, or new shortness of breath).    Prior CV studies:   The following studies were reviewed today:  Carotid US 05/2018 (Dr. Irven Shelling office) No hemodynamically significant arterial disease in the internal carotid arteries bilaterally  Myoview 06/2018 (Dr. Irven Shelling office) Normal perfusion, EF 81; Low Risk  Echocardiogram 03/28/2018 Mild LVH, EF 55-60, normal wall motion, grade 1 diastolic dysfunction, mildly dilated aortic root, mildly dilated ascending aorta, MAC, mild LAE, PASP 36  Echo 12/13/17 Mild LVH, EF 60-65, normal wall motion, grade 1 diastolic dysfunction, mild aortic stenosis (mean 12, peak 20), mildly dilated aortic root and ascending aorta (root 39, ascending aorta 38), MAC, trivial MR, normal RVSF  Echo 12/15/16 Mild LVH, EF 60-65, normal  wall motion, grade 1 diastolic dysfunction, mild aortic stenosis (mean 12/peak 22), trivial MR, normal RVSF, PASP 47 (mild pulmonary hypertension)  Echo 02/26/15 Moderate LVH, EF 65-70, normal wall motion, mild MR, mild LAE   Past Medical History:  Diagnosis Date  . Aortic stenosis    Echo 3/18: mild aortic stenosis (mean 12/peak 22 // Echo 3/19: mild AS  (mean 12, peak 20)   . Atherosclerosis of aorta ([place]) 12/16/2016   CXR 6/16: IMPRESSION: 1. Resolved left basilar airspace opacities. 2. Hazy nodularity in both lungs compatible with the patient's known pleural plaques. 3. Right anterior hemidiaphragmatic eventration. 4. Thoracic spondylosis. 5. Atherosclerotic calcification of the aortic arch.  . Bradycardia    Low HR (30s) noted during sleep during admx for sepsis in 2016; Mobitz 1, 2:1 block >> no indication for pacer; management of OSA recommended // transient CHB noted on ECG during admit in 10/2018   . Diabetes mellitus without complication ([place])   . Diabetic neuropathy ([place])   . DJD (degenerative joint disease)    knee, hip  . Echocardiogram    Echo 6/16: mod LVH, EF 65-70, vigorous LVF, no RWMA, mild MR, mild LAE // Echo 3/18: EF 60-65, Gr 1 DD, mild AS, PASP 47 // Echo 3/19: EF 60-65, Gr 1 DD, mild AS (mean 12) // Echo 7/19: EF 55-60, Gr 1 DD, PASP 36  . GERD (gastroesophageal reflux disease)   . Heart murmur   . HLD (hyperlipidemia)   . Hypertension   . Hyperthyroidism   . Lexiscan Myoview 06/2018   Normal perfusion, EF 81; Low Risk  . Mild diastolic dysfunction 76/54/6503   Noted on Echocardiogram; Patient does not require diuretic Rx  . Phlebitis of left leg   . Pneumonia 02/2015   admx with sepsis    Past Surgical History:  Procedure Laterality Date  . CATARACT EXTRACTION Right   . CIRCUMCISION    . FRACTURE SURGERY    . TOTAL HIP ARTHROPLASTY Left 09/24/2017   Procedure: LEFT TOTAL HIP ARTHROPLASTY ANTERIOR APPROACH;  Surgeon: Mcarthur Rossetti, MD;  Location: [facility];  Service: Orthopedics;  Laterality: Left;     Current Meds  Medication Sig  . ACCU-CHEK AVIVA PLUS test strip   . albuterol (PROVENTIL HFA;VENTOLIN HFA) 108 (90 Base) MCG/ACT inhaler Inhale 2 puffs into the lungs every 6 (six) hours as needed for wheezing or shortness of breath.  . Alcohol Swabs (B-D SINGLE USE SWABS REGULAR) PADS   . aspirin EC 81  MG tablet Take 81 mg by mouth daily.  . Calcium Carbonate-Vitamin D (CALCIUM 600+D PO) Take 1 tablet by mouth daily.  . fluticasone (FLONASE) 50 MCG/ACT nasal spray Place 2 sprays into both nostrils 2 (two) times daily.  Marland Kitchen gabapentin (NEURONTIN) 100 MG capsule Take 300 mg by mouth at bedtime.   . insulin aspart (NOVOLOG FLEXPEN) 100 UNIT/ML FlexPen Before each meal 3 times a day, 140-199 - 2 units, 200-250 - 4 units, 251-299 - 6 units,  300-349 - 8 units,  350 or above 10 units.  . Insulin Glargine (LANTUS) 100 UNIT/ML Solostar Pen Inject 15 Units into the skin daily at 10 pm.  . lisinopril (PRINIVIL,ZESTRIL) 5 MG tablet Take 1 tablet (5 mg total) by mouth daily.  . Multiple Vitamins-Minerals (MULTIVITAMIN PO) Take 1 tablet by mouth daily. Centrum Silver  . omeprazole (PRILOSEC) 20 MG capsule Take 20 mg by mouth daily as needed (reflux).   . rosuvastatin (CRESTOR) 10 MG tablet Take 1 tablet (10  mg total) by mouth at bedtime.  . senna-docusate (SENOKOT-S) 8.6-50 MG tablet Take 2 tablets by mouth at bedtime. (Patient taking differently: Take 1 tablet by mouth at bedtime. )  . sodium chloride (OCEAN) 0.65 % SOLN nasal spray Place 1 spray into both nostrils as needed for congestion.  Marland Kitchen tetrahydrozoline-zinc (VISINE-AC) 0.05-0.25 % ophthalmic solution Place 2 drops into both eyes 3 (three) times daily as needed (dry eyes).     Allergies:   Actos [pioglitazone]; Formaldehyde; Lipitor [atorvastatin calcium]; and Metformin and related   Social History   Tobacco Use  . Smoking status: Former Smoker    Last attempt to quit: 1960    Years since quitting: 60.2  . Smokeless tobacco: Never Used  Substance Use Topics  . Alcohol use: No  . Drug use: No     Family Hx: The patient's family history includes Diabetes in his brother, mother, and sister. There is no history of Heart attack, Stroke, or Hypertension.  ROS:   Please see the history of present illness.    All other systems reviewed and are  negative.   Labs/Other Tests and Data Reviewed:    Recent Labs: 11/18/2018: ALT 15; B Natriuretic Peptide 205.0 11/19/2018: Hemoglobin 11.3; Platelets 183 11/20/2018: BUN 16; Creatinine, Ser 1.13; Potassium 4.3; Sodium 139   Recent Lipid Panel No results found for: CHOL, TRIG, HDL, CHOLHDL, LDLCALC, LDLDIRECT   From [facility]   Wt Readings from Last 3 Encounters:  12/20/18 186 lb (84.4 kg)  11/18/18 190 lb (86.2 kg)  03/27/18 158 lb (71.7 kg)     Objective:    Vital Signs:  BP 121/72   Pulse 60   Ht _0  (1.702 m)   Wt 186 lb (84.4 kg)   BMI 29.13 kg/m     ASSESSMENT & PLAN:    Transient complete heart block (HCC) He has a prior history of transient Mobitz 1 and episodes of 2-1 AV block in the setting of pneumonia and sepsis in 2016.  He was evaluated by Dr. Caryl Comes at that time and not felt to meet criteria for pacemaker plantation.  During an admission in February 2020 for pneumonia, he had evidence for complete heart block on at least one ECG.  During his admission in July 2019, he also had second-degree block with 2-1 conduction on 1 ECG.  He has not had any symptoms of syncope or near syncope.  We may want to have him wear a monitor for 2-4 weeks to assess for high grade heart block.  But, I will ask Dr. Caryl Comes to review his chart first before we order a monitor.    Elevated troponin level not due to acute coronary syndrome He had minimally elevated troponin levels without significant trend during an admission for hypoxic respiratory failure due to probable pneumonia and sepsis.  His echocardiogram demonstrated normal LV function and no significant wall motion abnormalities.  This was likely demand ischemia.  He was referred over to Dr. Einar Gip in 06/2018 and had a Myoview that was low risk.  He is not having any symptoms to suggest angina.     Aortic valve stenosis, etiology of cardiac valve disease unspecified Mild by echocardiogram in March 2019.  FU echo this month was  canceled due to restrictions related to COVID-19.  Plan repeat echocardiogram in 6 to 12 months.   Atherosclerosis of aorta (HCC) Continue aspirin, statin.   History of DVT (deep vein thrombosis) This was provoked after his hip replacement.  He  is no longer on anticoagulation.    Essential hypertension The patient's blood pressure is controlled on his current regimen.  Continue current therapy.     COVID-19 Education: The signs and symptoms of COVID-19 were discussed with the patient and how to seek care for testing (follow up with PCP or arrange E-visit).  The importance of social distancing was discussed today.  Patient Risk:   After full review of this patient's clinical status, I feel that they are at least moderate risk at this time.  Time:   Today, I have spent 18 minutes with the patient with telehealth technology discussing the above problems.     Medication Adjustments/Labs and Tests Ordered: Current medicines are reviewed at length with the patient today.  Concerns regarding medicines are outlined above.  Tests Ordered: No orders of the defined types were placed in this encounter.  Medication Changes: No orders of the defined types were placed in this encounter.   Disposition:  Follow up in 6 month(s)  Signed, Richardson Dopp, PA-C  12/20/2018 2:49 PM    [place]

## 2018-12-20 NOTE — Telephone Encounter (Signed)
TELEPHONE CALL NOTE  [name] has been deemed a candidate for a follow-up tele-health visit to limit community exposure during the Covid-19 pandemic. I spoke with the patient via phone to ensure availability of phone/video source, confirm preferred email & phone number, and discuss instructions and expectations.  I reminded Jackson [name] to be prepared with any vital sign and/or heart rhythm information that could potentially be obtained via home monitoring, at the time of his visit. I reminded Jackson [name] to expect a phone call at the time of his visit if his visit.  Did the patient verbally acknowledge consent to ttreatment?  Park Liter, CMA 12/20/2018 12:03 PM

## 2018-12-21 NOTE — Progress Notes (Signed)
Looked at the ECG  Interesting  Clearly still with heart blcok prob but appears to be confusing because of Junctional beats competing  In the absence of synmptoms would defer monitoring  But would advise that symptoms should not be ignored

## 2018-12-26 ENCOUNTER — Other Ambulatory Visit: Payer: Self-pay

## 2018-12-26 DIAGNOSIS — I10 Essential (primary) hypertension: Secondary | ICD-10-CM

## 2018-12-26 MED ORDER — LISINOPRIL 5 MG PO TABS: 5.0000 mg | ORAL_TABLET | Freq: Every day | ORAL | 3 refills | Status: AC

## 2018-12-27 ENCOUNTER — Other Ambulatory Visit: Payer: Self-pay

## 2018-12-27 DIAGNOSIS — I440 Atrioventricular block, first degree: Secondary | ICD-10-CM

## 2018-12-27 MED ORDER — ROSUVASTATIN CALCIUM 10 MG PO TABS: 10.0000 mg | ORAL_TABLET | Freq: Every day | ORAL | 1 refills | Status: DC

## 2019-01-22 ENCOUNTER — Encounter (HOSPITAL_COMMUNITY): Payer: Self-pay | Admitting: General Practice

## 2019-01-22 ENCOUNTER — Inpatient Hospital Stay (HOSPITAL_COMMUNITY)
Admission: EM | Admit: 2019-01-22 | Discharge: 2019-01-24 | DRG: 871 | Disposition: A | Discharge status: Home / Self Care | Payer: Medicare HMO | Attending: Family Medicine | Admitting: Family Medicine

## 2019-01-22 ENCOUNTER — Emergency Department (HOSPITAL_COMMUNITY): Payer: Medicare HMO

## 2019-01-22 ENCOUNTER — Other Ambulatory Visit: Payer: Self-pay

## 2019-01-22 DIAGNOSIS — K219 Gastro-esophageal reflux disease without esophagitis: Secondary | ICD-10-CM | POA: Diagnosis present

## 2019-01-22 DIAGNOSIS — E785 Hyperlipidemia, unspecified: Secondary | ICD-10-CM | POA: Diagnosis not present

## 2019-01-22 DIAGNOSIS — R001 Bradycardia, unspecified: Secondary | ICD-10-CM | POA: Diagnosis not present

## 2019-01-22 DIAGNOSIS — R6521 Severe sepsis with septic shock: Secondary | ICD-10-CM | POA: Diagnosis not present

## 2019-01-22 DIAGNOSIS — E1165 Type 2 diabetes mellitus with hyperglycemia: Secondary | ICD-10-CM | POA: Diagnosis not present

## 2019-01-22 DIAGNOSIS — R0602 Shortness of breath: Secondary | ICD-10-CM | POA: Diagnosis present

## 2019-01-22 DIAGNOSIS — Y95 Nosocomial condition: Secondary | ICD-10-CM | POA: Diagnosis present

## 2019-01-22 DIAGNOSIS — J9621 Acute and chronic respiratory failure with hypoxia: Secondary | ICD-10-CM | POA: Diagnosis present

## 2019-01-22 DIAGNOSIS — R0600 Dyspnea, unspecified: Secondary | ICD-10-CM | POA: Diagnosis not present

## 2019-01-22 DIAGNOSIS — A419 Sepsis, unspecified organism: Principal | ICD-10-CM | POA: Diagnosis present

## 2019-01-22 DIAGNOSIS — Z87891 Personal history of nicotine dependence: Secondary | ICD-10-CM | POA: Diagnosis not present

## 2019-01-22 DIAGNOSIS — Z96642 Presence of left artificial hip joint: Secondary | ICD-10-CM | POA: Diagnosis present

## 2019-01-22 DIAGNOSIS — J9601 Acute respiratory failure with hypoxia: Secondary | ICD-10-CM | POA: Diagnosis not present

## 2019-01-22 DIAGNOSIS — R1111 Vomiting without nausea: Secondary | ICD-10-CM | POA: Diagnosis not present

## 2019-01-22 DIAGNOSIS — I350 Nonrheumatic aortic (valve) stenosis: Secondary | ICD-10-CM | POA: Diagnosis present

## 2019-01-22 DIAGNOSIS — E1142 Type 2 diabetes mellitus with diabetic polyneuropathy: Secondary | ICD-10-CM | POA: Diagnosis not present

## 2019-01-22 DIAGNOSIS — I110 Hypertensive heart disease with heart failure: Secondary | ICD-10-CM | POA: Diagnosis present

## 2019-01-22 DIAGNOSIS — Z20828 Contact with and (suspected) exposure to other viral communicable diseases: Secondary | ICD-10-CM | POA: Diagnosis not present

## 2019-01-22 DIAGNOSIS — I5032 Chronic diastolic (congestive) heart failure: Secondary | ICD-10-CM | POA: Diagnosis not present

## 2019-01-22 DIAGNOSIS — Z79899 Other long term (current) drug therapy: Secondary | ICD-10-CM

## 2019-01-22 DIAGNOSIS — I10 Essential (primary) hypertension: Secondary | ICD-10-CM | POA: Diagnosis not present

## 2019-01-22 DIAGNOSIS — Z794 Long term (current) use of insulin: Secondary | ICD-10-CM | POA: Diagnosis not present

## 2019-01-22 DIAGNOSIS — Z7982 Long term (current) use of aspirin: Secondary | ICD-10-CM

## 2019-01-22 DIAGNOSIS — J209 Acute bronchitis, unspecified: Secondary | ICD-10-CM | POA: Diagnosis present

## 2019-01-22 DIAGNOSIS — R0902 Hypoxemia: Secondary | ICD-10-CM | POA: Diagnosis not present

## 2019-01-22 DIAGNOSIS — Z833 Family history of diabetes mellitus: Secondary | ICD-10-CM | POA: Diagnosis not present

## 2019-01-22 DIAGNOSIS — Z7951 Long term (current) use of inhaled steroids: Secondary | ICD-10-CM | POA: Diagnosis not present

## 2019-01-22 DIAGNOSIS — J42 Unspecified chronic bronchitis: Secondary | ICD-10-CM | POA: Diagnosis present

## 2019-01-22 DIAGNOSIS — J189 Pneumonia, unspecified organism: Secondary | ICD-10-CM | POA: Diagnosis present

## 2019-01-22 DIAGNOSIS — T17908A Unspecified foreign body in respiratory tract, part unspecified causing other injury, initial encounter: Secondary | ICD-10-CM | POA: Insufficient documentation

## 2019-01-22 DIAGNOSIS — R531 Weakness: Secondary | ICD-10-CM | POA: Diagnosis not present

## 2019-01-22 LAB — COMPREHENSIVE METABOLIC PANEL
ALT: 18 U/L (ref 0–44)
AST: 17 U/L (ref 15–41)
Albumin: 4 g/dL (ref 3.5–5.0)
Alkaline Phosphatase: 64 U/L (ref 38–126)
Anion gap: 9 (ref 5–15)
BUN: 19 mg/dL (ref 8–23)
CO2: 27 mmol/L (ref 22–32)
Calcium: 9.6 mg/dL (ref 8.9–10.3)
Chloride: 103 mmol/L (ref 98–111)
Creatinine, Ser: 1.03 mg/dL (ref 0.61–1.24)
GFR calc Af Amer: >60 mL/min (ref >60)
GFR calc non Af Amer: >60 mL/min (ref >60)
Glucose, Bld: 236 mg/dL — ABNORMAL HIGH (ref 70–99)
Potassium: 3.9 mmol/L (ref 3.5–5.1)
Sodium: 139 mmol/L (ref 135–145)
Total Bilirubin: 1 mg/dL (ref 0.3–1.2)
Total Protein: 6.9 g/dL (ref 6.5–8.1)

## 2019-01-22 LAB — EXPECTORATED SPUTUM ASSESSMENT W GRAM STAIN, RFLX TO RESP C: Special Requests: NORMAL

## 2019-01-22 LAB — CBC WITH DIFFERENTIAL/PLATELET
Abs Immature Granulocytes: 0.07 10*3/uL (ref 0.00–0.07)
Basophils Absolute: 0 10*3/uL (ref 0.0–0.1)
Basophils Relative: 0 %
Eosinophils Absolute: 0.1 10*3/uL (ref 0.0–0.5)
Eosinophils Relative: 1 %
HCT: 42 % (ref 39.0–52.0)
Hemoglobin: 13.9 g/dL (ref 13.0–17.0)
Immature Granulocytes: 1 %
Lymphocytes Relative: 4 %
Lymphs Abs: 0.6 10*3/uL — ABNORMAL LOW (ref 0.7–4.0)
MCH: 31 pg (ref 26.0–34.0)
MCHC: 33.1 g/dL (ref 30.0–36.0)
MCV: 93.8 fL (ref 80.0–100.0)
Monocytes Absolute: 1.2 10*3/uL — ABNORMAL HIGH (ref 0.1–1.0)
Monocytes Relative: 9 %
Neutro Abs: 11.1 10*3/uL — ABNORMAL HIGH (ref 1.7–7.7)
Neutrophils Relative %: 85 %
Platelets: 230 10*3/uL (ref 150–400)
RBC: 4.48 MIL/uL (ref 4.22–5.81)
RDW: 13.1 % (ref 11.5–15.5)
WBC: 13 10*3/uL — ABNORMAL HIGH (ref 4.0–10.5)
nRBC: 0 % (ref 0.0–0.2)

## 2019-01-22 LAB — BLOOD GAS, ARTERIAL
Acid-Base Excess: 2.2 mmol/L — ABNORMAL HIGH (ref 0.0–2.0)
Bicarbonate: 27.9 mmol/L (ref 20.0–28.0)
Drawn by: 295031
O2 Content: 4 L/min
O2 Saturation: 95.1 %
Patient temperature: 37
pCO2 arterial: 50.5 mmHg — ABNORMAL HIGH (ref 32.0–48.0)
pH, Arterial: 7.361 (ref 7.350–7.450)
pO2, Arterial: 78.2 mmHg — ABNORMAL LOW (ref 83.0–108.0)

## 2019-01-22 LAB — GLUCOSE, CAPILLARY
Glucose-Capillary: 195 mg/dL — ABNORMAL HIGH (ref 70–99)
Glucose-Capillary: 243 mg/dL — ABNORMAL HIGH (ref 70–99)

## 2019-01-22 LAB — TROPONIN I: Troponin I: 0.03 ng/mL (ref <0.03)

## 2019-01-22 LAB — URINALYSIS, ROUTINE W REFLEX MICROSCOPIC
Bilirubin Urine: NEGATIVE
Glucose, UA: 50 mg/dL — AB
Hgb urine dipstick: NEGATIVE
Ketones, ur: NEGATIVE mg/dL
Leukocytes,Ua: NEGATIVE
Nitrite: NEGATIVE
Protein, ur: NEGATIVE mg/dL
Specific Gravity, Urine: 1.012 (ref 1.005–1.030)
pH: 6 (ref 5.0–8.0)

## 2019-01-22 LAB — SARS CORONAVIRUS 2 BY RT PCR (HOSPITAL ORDER, PERFORMED IN ~~LOC~~ HOSPITAL LAB): SARS Coronavirus 2: NEGATIVE

## 2019-01-22 LAB — LACTIC ACID, PLASMA: Lactic Acid, Venous: 1.2 mmol/L (ref 0.5–1.9)

## 2019-01-22 LAB — MRSA PCR SCREENING: MRSA by PCR: NEGATIVE

## 2019-01-22 MED ORDER — ORAL CARE MOUTH RINSE
15.0000 mL | Freq: Two times a day (BID) | OROMUCOSAL | Status: DC
  Administered 2019-01-22 – 2019-01-24 (×4): 15 mL via OROMUCOSAL

## 2019-01-22 MED ORDER — CALCIUM CARBONATE-VITAMIN D 500-200 MG-UNIT PO TABS
1.0000 | ORAL_TABLET | Freq: Every day | ORAL | Status: DC
  Administered 2019-01-23 – 2019-01-24 (×2): 1 via ORAL
  Filled 2019-01-22 (×2): qty 1

## 2019-01-22 MED ORDER — IPRATROPIUM BROMIDE 0.02 % IN SOLN
0.5000 mg | Freq: Four times a day (QID) | RESPIRATORY_TRACT | Status: DC
  Administered 2019-01-22 – 2019-01-23 (×5): 0.5 mg via RESPIRATORY_TRACT
  Filled 2019-01-22 (×5): qty 2.5

## 2019-01-22 MED ORDER — HYDROCODONE-ACETAMINOPHEN 5-325 MG PO TABS: 1.0000 | ORAL_TABLET | ORAL | Status: DC | PRN

## 2019-01-22 MED ORDER — SENNOSIDES-DOCUSATE SODIUM 8.6-50 MG PO TABS: 1.0000 | ORAL_TABLET | Freq: Every evening | ORAL | Status: DC | PRN

## 2019-01-22 MED ORDER — METRONIDAZOLE IN NACL 5-0.79 MG/ML-% IV SOLN
500.0000 mg | Freq: Once | INTRAVENOUS | Status: AC
  Administered 2019-01-22: 10:00:00 500 mg via INTRAVENOUS
  Filled 2019-01-22: qty 100

## 2019-01-22 MED ORDER — INSULIN GLARGINE 100 UNIT/ML ~~LOC~~ SOLN
15.0000 [IU] | Freq: Every day | SUBCUTANEOUS | Status: DC
  Administered 2019-01-22 – 2019-01-23 (×2): 15 [IU] via SUBCUTANEOUS
  Filled 2019-01-22 (×3): qty 0.15

## 2019-01-22 MED ORDER — ALBUTEROL SULFATE (2.5 MG/3ML) 0.083% IN NEBU: 2.5000 mg | INHALATION_SOLUTION | Freq: Four times a day (QID) | RESPIRATORY_TRACT | Status: DC | PRN

## 2019-01-22 MED ORDER — BISACODYL 5 MG PO TBEC: 5.0000 mg | DELAYED_RELEASE_TABLET | Freq: Every day | ORAL | Status: DC | PRN

## 2019-01-22 MED ORDER — SODIUM CHLORIDE 0.9 % IV BOLUS
1000.0000 mL | Freq: Once | INTRAVENOUS | Status: AC
  Administered 2019-01-22: 10:00:00 1000 mL via INTRAVENOUS

## 2019-01-22 MED ORDER — ENOXAPARIN SODIUM 40 MG/0.4ML ~~LOC~~ SOLN
40.0000 mg | SUBCUTANEOUS | Status: DC
  Administered 2019-01-22 – 2019-01-23 (×2): 40 mg via SUBCUTANEOUS
  Filled 2019-01-22 (×2): qty 0.4

## 2019-01-22 MED ORDER — INSULIN ASPART 100 UNIT/ML ~~LOC~~ SOLN
0.0000 [IU] | Freq: Every day | SUBCUTANEOUS | Status: DC
  Administered 2019-01-22: 22:00:00 2 [IU] via SUBCUTANEOUS

## 2019-01-22 MED ORDER — ONDANSETRON HCL 4 MG/2ML IJ SOLN: 4.0000 mg | Freq: Four times a day (QID) | INTRAMUSCULAR | Status: DC | PRN

## 2019-01-22 MED ORDER — GABAPENTIN 300 MG PO CAPS
300.0000 mg | ORAL_CAPSULE | Freq: Every day | ORAL | Status: DC
  Administered 2019-01-22 – 2019-01-23 (×2): 300 mg via ORAL
  Filled 2019-01-22 (×2): qty 1

## 2019-01-22 MED ORDER — HYDRALAZINE HCL 20 MG/ML IJ SOLN: 10.0000 mg | INTRAMUSCULAR | Status: DC | PRN

## 2019-01-22 MED ORDER — ONDANSETRON HCL 4 MG/2ML IJ SOLN
4.0000 mg | Freq: Once | INTRAMUSCULAR | Status: AC
  Administered 2019-01-22: 10:00:00 4 mg via INTRAVENOUS
  Filled 2019-01-22: qty 2

## 2019-01-22 MED ORDER — FLUTICASONE PROPIONATE 50 MCG/ACT NA SUSP
2.0000 | Freq: Two times a day (BID) | NASAL | Status: DC
  Administered 2019-01-22 – 2019-01-24 (×4): 2 via NASAL
  Filled 2019-01-22: qty 16

## 2019-01-22 MED ORDER — CALCIUM CARBONATE-VITAMIN D 600-200 MG-UNIT PO TABS: ORAL_TABLET | Freq: Every day | ORAL | Status: DC

## 2019-01-22 MED ORDER — CHLORHEXIDINE GLUCONATE CLOTH 2 % EX PADS
6.0000 | MEDICATED_PAD | Freq: Every day | CUTANEOUS | Status: DC
  Administered 2019-01-23 – 2019-01-24 (×2): 6 via TOPICAL

## 2019-01-22 MED ORDER — ONDANSETRON HCL 4 MG PO TABS: 4.0000 mg | ORAL_TABLET | Freq: Four times a day (QID) | ORAL | Status: DC | PRN

## 2019-01-22 MED ORDER — ALBUTEROL SULFATE HFA 108 (90 BASE) MCG/ACT IN AERS: 2.0000 | INHALATION_SPRAY | Freq: Four times a day (QID) | RESPIRATORY_TRACT | Status: DC | PRN

## 2019-01-22 MED ORDER — LEVALBUTEROL HCL 0.63 MG/3ML IN NEBU
0.6300 mg | INHALATION_SOLUTION | Freq: Four times a day (QID) | RESPIRATORY_TRACT | Status: DC
  Administered 2019-01-22 – 2019-01-23 (×5): 0.63 mg via RESPIRATORY_TRACT
  Filled 2019-01-22 (×5): qty 3

## 2019-01-22 MED ORDER — INSULIN GLARGINE 100 UNIT/ML SOLOSTAR PEN: 15.0000 [IU] | PEN_INJECTOR | Freq: Every day | SUBCUTANEOUS | Status: DC

## 2019-01-22 MED ORDER — INSULIN ASPART 100 UNIT/ML ~~LOC~~ SOLN
0.0000 [IU] | Freq: Three times a day (TID) | SUBCUTANEOUS | Status: DC
  Administered 2019-01-22: 18:00:00 3 [IU] via SUBCUTANEOUS
  Administered 2019-01-23 (×2): 8 [IU] via SUBCUTANEOUS
  Administered 2019-01-23 – 2019-01-24 (×2): 3 [IU] via SUBCUTANEOUS

## 2019-01-22 MED ORDER — VANCOMYCIN HCL IN DEXTROSE 1-5 GM/200ML-% IV SOLN
1000.0000 mg | Freq: Once | INTRAVENOUS | Status: AC
  Administered 2019-01-22: 11:00:00 1000 mg via INTRAVENOUS
  Filled 2019-01-22: qty 200

## 2019-01-22 MED ORDER — SALINE SPRAY 0.65 % NA SOLN
1.0000 | NASAL | Status: DC | PRN
  Filled 2019-01-22: qty 44

## 2019-01-22 MED ORDER — VANCOMYCIN HCL IN DEXTROSE 1-5 GM/200ML-% IV SOLN: 1000.0000 mg | Freq: Once | INTRAVENOUS | Status: DC

## 2019-01-22 MED ORDER — ASPIRIN EC 81 MG PO TBEC
81.0000 mg | DELAYED_RELEASE_TABLET | Freq: Every day | ORAL | Status: DC
  Administered 2019-01-22 – 2019-01-24 (×3): 81 mg via ORAL
  Filled 2019-01-22 (×3): qty 1

## 2019-01-22 MED ORDER — ACETAMINOPHEN 650 MG RE SUPP: 650.0000 mg | Freq: Four times a day (QID) | RECTAL | Status: DC | PRN

## 2019-01-22 MED ORDER — ACETAMINOPHEN 325 MG PO TABS
650.0000 mg | ORAL_TABLET | Freq: Four times a day (QID) | ORAL | Status: DC | PRN
  Administered 2019-01-23: 22:00:00 650 mg via ORAL
  Filled 2019-01-22: qty 2

## 2019-01-22 MED ORDER — SODIUM CHLORIDE 0.9 % IV SOLN
500.0000 mg | INTRAVENOUS | Status: DC
  Administered 2019-01-22: 18:00:00 500 mg via INTRAVENOUS
  Filled 2019-01-22: qty 500

## 2019-01-22 MED ORDER — ROSUVASTATIN CALCIUM 10 MG PO TABS
10.0000 mg | ORAL_TABLET | Freq: Every day | ORAL | Status: DC
  Administered 2019-01-22 – 2019-01-23 (×2): 10 mg via ORAL
  Filled 2019-01-22 (×2): qty 1

## 2019-01-22 MED ORDER — PANTOPRAZOLE SODIUM 40 MG PO TBEC
40.0000 mg | DELAYED_RELEASE_TABLET | Freq: Every day | ORAL | Status: DC
  Administered 2019-01-22 – 2019-01-24 (×3): 40 mg via ORAL
  Filled 2019-01-22 (×3): qty 1

## 2019-01-22 MED ORDER — ACETAMINOPHEN 500 MG PO TABS
1000.0000 mg | ORAL_TABLET | Freq: Once | ORAL | Status: AC
  Administered 2019-01-22: 1000 mg via ORAL
  Filled 2019-01-22: qty 2

## 2019-01-22 MED ORDER — ATROPINE SULFATE 1 MG/10ML IJ SOSY
PREFILLED_SYRINGE | INTRAMUSCULAR | Status: AC
  Filled 2019-01-22: qty 10

## 2019-01-22 MED ORDER — SODIUM CHLORIDE 0.9 % IV SOLN: 2.0000 g | Freq: Once | INTRAVENOUS | Status: DC

## 2019-01-22 MED ORDER — LISINOPRIL 2.5 MG PO TABS
5.0000 mg | ORAL_TABLET | Freq: Every day | ORAL | Status: DC
  Administered 2019-01-22 – 2019-01-24 (×3): 5 mg via ORAL
  Filled 2019-01-22 (×3): qty 2

## 2019-01-22 MED ORDER — VANCOMYCIN HCL 10 G IV SOLR
1250.0000 mg | INTRAVENOUS | Status: DC
  Filled 2019-01-22: qty 1250

## 2019-01-22 MED ORDER — PIPERACILLIN-TAZOBACTAM 3.375 G IVPB
3.3750 g | Freq: Three times a day (TID) | INTRAVENOUS | Status: DC
  Administered 2019-01-22 – 2019-01-24 (×6): 3.375 g via INTRAVENOUS
  Filled 2019-01-22 (×6): qty 50

## 2019-01-22 MED ORDER — SODIUM CHLORIDE 0.9 % IV SOLN
2.0000 g | Freq: Once | INTRAVENOUS | Status: AC
  Administered 2019-01-22: 10:00:00 2 g via INTRAVENOUS
  Filled 2019-01-22: qty 2

## 2019-01-22 NOTE — ED Notes (Signed)
ED TO INPATIENT HANDOFF REPORT  ED Nurse Name and Phone #: Waldo Laine [phone]  S Name/Age/Gender [name] 85 y.o. male Room/Bed: WA20/WA20  Code Status   Code Status: Prior  Home/SNF/Other Home Patient oriented to: self, place, time and situation Is this baseline? Yes   Triage Complete: Triage complete  Chief Complaint Vomiting  Triage Note Patient is coming from home. Pt reports emesis this morning after eating jalepeno poppers. Patient's wife and son report that patient has emesis with some blood in it whenever he eats anything spicy. Patient reportedly not on oxygen at home but is in the 80's today. Patient placed on 2L [place] by EMS.    Allergies Allergies  Allergen Reactions  . Actos [Pioglitazone] Swelling  . Formaldehyde Other (See Comments)    Flu-like symptoms  . Lipitor [Atorvastatin Calcium]   . Metformin And Related     Level of Care/Admitting Diagnosis ED Disposition    ED Disposition Condition Comment   Admit  Hospital Area: [facility] [[id number]]  Level of Care: Stepdown [14]  Admit to SDU based on following criteria: Respiratory Distress:  Frequent assessment and/or intervention to maintain adequate ventilation/respiration, pulmonary toilet, and respiratory treatment.  Covid Evaluation: Person Under Investigation (PUI)  Isolation Risk Level: Low Risk/Droplet (Less than 4L [place] supplementation)  Diagnosis: Aspiration into airway [[id number]]  Admitting Physician: Stephania Fragmin [facility] [[id number]]  Attending Physician: Stephania Fragmin [facility] [[id number]]  Estimated length of stay: past midnight tomorrow  Certification:: I certify this patient will need inpatient services for at least 2 midnights  PT Class (Do Not Modify): Inpatient [101]  PT Acc Code (Do Not Modify): Private [1]       B Medical/Surgery History Past Medical History:  Diagnosis Date  . Aortic stenosis    Echo 3/18: mild aortic stenosis (mean 12/peak 22 // Echo 3/19: mild AS  (mean 12, peak 20)   . Atherosclerosis of aorta (HCC) 12/16/2016   CXR 6/16: IMPRESSION: 1. Resolved left basilar airspace opacities. 2. Hazy nodularity in both lungs compatible with the patient's known pleural plaques. 3. Right anterior hemidiaphragmatic eventration. 4. Thoracic spondylosis. 5. Atherosclerotic calcification of the aortic arch.  . Bradycardia    Low HR (30s) noted during sleep during admx for sepsis in 2016; [place] 1, 2:1 block >> no indication for pacer; management of OSA recommended // transient CHB noted on ECG during admit in 10/2018   . Diabetes mellitus without complication (HCC)   . Diabetic neuropathy (HCC)   . DJD (degenerative joint disease)    knee, hip  . Echocardiogram    Echo 6/16: mod LVH, EF 65-70, vigorous LVF, no RWMA, mild MR, mild LAE // Echo 3/18: EF 60-65, Gr 1 DD, mild AS, PASP 47 // Echo 3/19: EF 60-65, Gr 1 DD, mild AS (mean 12) // Echo 7/19: EF 55-60, Gr 1 DD, PASP 36  . GERD (gastroesophageal reflux disease)   . Heart murmur   . HLD (hyperlipidemia)   . Hypertension   . Hyperthyroidism   . Lexiscan Myoview 06/2018   Normal perfusion, EF 81; Low Risk  . Mild diastolic dysfunction 11/18/2018   Noted on Echocardiogram; Patient does not require diuretic Rx  . Phlebitis of left leg   . Pneumonia 02/2015   admx with sepsis    Past Surgical History:  Procedure Laterality Date  . CATARACT EXTRACTION Right   . CIRCUMCISION    . FRACTURE SURGERY    . TOTAL HIP ARTHROPLASTY Left 09/24/2017  Procedure: LEFT TOTAL HIP ARTHROPLASTY ANTERIOR APPROACH;  Surgeon: Kathryne Hitch, MD;  Location: [facility];  Service: Orthopedics;  Laterality: Left;     A IV Location/Drains/Wounds Patient Lines/Drains/Airways Status   Active Line/Drains/Airways    Name:   Placement date:   Placement time:   Site:   Days:   Peripheral IV 01/22/19 Left Forearm   01/22/19    0926    Forearm   less than 1   Peripheral IV 01/22/19 Right Antecubital   01/22/19    0944     Antecubital   less than 1          Intake/Output Last 24 hours  Intake/Output Summary (Last 24 hours) at 01/22/2019 1247 Last data filed at 01/22/2019 1146 Gross per 24 hour  Intake 1399.78 ml  Output --  Net 1399.78 ml    Labs/Imaging Results for orders placed or performed during the hospital encounter of 01/22/19 (from the past 48 hour(s))  CBC with Differential     Status: Abnormal   Collection Time: 01/22/19  9:40 AM  Result Value Ref Range   WBC 13.0 (H) 4.0 - 10.5 K/uL   RBC 4.48 4.22 - 5.81 MIL/uL   Hemoglobin 13.9 13.0 - 17.0 g/dL   HCT 67.6 72.0 - 94.7 %   MCV 93.8 80.0 - 100.0 fL   MCH 31.0 26.0 - 34.0 pg   MCHC 33.1 30.0 - 36.0 g/dL   RDW 09.6 28.3 - 66.2 %   Platelets 230 150 - 400 K/uL   nRBC 0.0 0.0 - 0.2 %   Neutrophils Relative % 85 %   Neutro Abs 11.1 (H) 1.7 - 7.7 K/uL   Lymphocytes Relative 4 %   Lymphs Abs 0.6 (L) 0.7 - 4.0 K/uL   Monocytes Relative 9 %   Monocytes Absolute 1.2 (H) 0.1 - 1.0 K/uL   Eosinophils Relative 1 %   Eosinophils Absolute 0.1 0.0 - 0.5 K/uL   Basophils Relative 0 %   Basophils Absolute 0.0 0.0 - 0.1 K/uL   Immature Granulocytes 1 %   Abs Immature Granulocytes 0.07 0.00 - 0.07 K/uL    Comment: Performed at [facility], [address], [place], [place] [address]  Comprehensive metabolic panel     Status: Abnormal   Collection Time: 01/22/19  9:40 AM  Result Value Ref Range   Sodium 139 135 - 145 mmol/L   Potassium 3.9 3.5 - 5.1 mmol/L   Chloride 103 98 - 111 mmol/L   CO2 27 22 - 32 mmol/L   Glucose, Bld 236 (H) 70 - 99 mg/dL   BUN 19 8 - 23 mg/dL   Creatinine, Ser 4.65 0.61 - 1.24 mg/dL   Calcium 9.6 8.9 - 03.5 mg/dL   Total Protein 6.9 6.5 - 8.1 g/dL   Albumin 4.0 3.5 - 5.0 g/dL   AST 17 15 - 41 U/L   ALT 18 0 - 44 U/L   Alkaline Phosphatase 64 38 - 126 U/L   Total Bilirubin 1.0 0.3 - 1.2 mg/dL   GFR calc non Af Amer >60 >60 mL/min   GFR calc Af Amer >60 >60 mL/min   Anion gap 9 5 - 15     Comment: Performed at [facility], [address], [place], [place] [address]  Troponin I - ONCE - STAT     Status: Abnormal   Collection Time: 01/22/19  9:40 AM  Result Value Ref Range   Troponin I 0.03 (HH) <  0.03 ng/mL    Comment: CRITICAL RESULT CALLED TO, READ BACK BY AND VERIFIED WITH: K Kimani Hovis,RN 01/22/19 1028 RHOLMES Performed at [facility], [address] [place], [place] [address]   Lactic acid, plasma     Status: None   Collection Time: 01/22/19  9:40 AM  Result Value Ref Range   Lactic Acid, Venous 1.2 0.5 - 1.9 mmol/L    Comment: Performed at [facility], [address] [place], [place] [address]  Urinalysis, Routine w reflex microscopic     Status: Abnormal   Collection Time: 01/22/19  9:40 AM  Result Value Ref Range   Color, Urine YELLOW YELLOW   APPearance CLEAR CLEAR   Specific Gravity, Urine 1.012 1.005 - 1.030   pH 6.0 5.0 - 8.0   Glucose, UA 50 (A) NEGATIVE mg/dL   Hgb urine dipstick NEGATIVE NEGATIVE   Bilirubin Urine NEGATIVE NEGATIVE   Ketones, ur NEGATIVE NEGATIVE mg/dL   Protein, ur NEGATIVE NEGATIVE mg/dL   Nitrite NEGATIVE NEGATIVE   Leukocytes,Ua NEGATIVE NEGATIVE    Comment: Performed at [facility], [address] [place], [place] [address]  Expectorated sputum assessment w rflx to resp cult     Status: None   Collection Time: 01/22/19 10:04 AM  Result Value Ref Range   Specimen Description SPUTUM    Special Requests Normal    Sputum evaluation      THIS SPECIMEN IS ACCEPTABLE FOR SPUTUM CULTURE Performed at [facility], [address] [place], [place] [address]    Report Status 01/22/2019 FINAL   SARS Coronavirus 2 (CEPHEID - Performed in [facility] hospital lab), Hosp Order     Status: None   Collection Time: 01/22/19 10:04 AM  Result Value Ref Range   SARS Coronavirus 2 NEGATIVE NEGATIVE    Comment: (NOTE) If result is NEGATIVE SARS-CoV-2  target nucleic acids are NOT DETECTED. The SARS-CoV-2 RNA is generally detectable in upper and lower  respiratory specimens during the acute phase of infection. The lowest  concentration of SARS-CoV-2 viral copies this assay can detect is 250  copies / mL. A negative result does not preclude SARS-CoV-2 infection  and should not be used as the sole basis for treatment or other  patient management decisions.  A negative result may occur with  improper specimen collection / handling, submission of specimen other  than nasopharyngeal swab, presence of viral mutation(s) within the  areas targeted by this assay, and inadequate number of viral copies  (<250 copies / mL). A negative result must be combined with clinical  observations, patient history, and epidemiological information. If result is POSITIVE SARS-CoV-2 target nucleic acids are DETECTED. The SARS-CoV-2 RNA is generally detectable in upper and lower  respiratory specimens dur ing the acute phase of infection.  Positive  results are indicative of active infection with SARS-CoV-2.  Clinical  correlation with patient history and other diagnostic information is  necessary to determine patient infection status.  Positive results do  not rule out bacterial infection or co-infection with other viruses. If result is PRESUMPTIVE POSTIVE SARS-CoV-2 nucleic acids MAY BE PRESENT.   A presumptive positive result was obtained on the submitted specimen  and confirmed on repeat testing.  While 2019 novel coronavirus  (SARS-CoV-2) nucleic acids may be present in the submitted sample  additional confirmatory testing may be necessary for epidemiological  and / or clinical management purposes  to differentiate between  SARS-CoV-2 and other Sarbecovirus currently known  to infect humans.  If clinically indicated additional testing with an alternate test  methodology [id number]) is advised. The SARS-CoV-2 RNA is generally  detectable in upper and lower  respiratory sp ecimens during the acute  phase of infection. The expected result is Negative. Fact Sheet for Patients:  BoilerBrush.com.cy Fact Sheet for Healthcare Providers: https://pope.com/ This test is not yet approved or cleared by the [place] FDA and has been authorized for detection and/or diagnosis of SARS-CoV-2 by FDA under an Emergency Use Authorization (EUA).  This EUA will remain in effect (meaning this test can be used) for the duration of the COVID-19 declaration under Section 564(b)(1) of the Act, 21 U.S.C. section 360bbb-3(b)(1), unless the authorization is terminated or revoked sooner. Performed at [facility], [address] [place], [place] [address]   Blood gas, arterial (at [facility])     Status: Abnormal   Collection Time: 01/22/19 10:29 AM  Result Value Ref Range   O2 Content 4.0 L/min   Delivery systems NASAL CANNULA    pH, Arterial 7.361 7.350 - 7.450   pCO2 arterial 50.5 (H) 32.0 - 48.0 mmHg   pO2, Arterial 78.2 (L) 83.0 - 108.0 mmHg   Bicarbonate 27.9 20.0 - 28.0 mmol/L   Acid-Base Excess 2.2 (H) 0.0 - 2.0 mmol/L   O2 Saturation 95.1 %   Patient temperature 37.0    Collection site RIGHT RADIAL    Drawn by [phone]    Sample type ARTERIAL DRAW    Allens test (pass/fail) PASS PASS    Comment: Performed at [facility], [address], [place], [place] [address]   Dg Chest Port 1 View  Result Date: 01/22/2019 CLINICAL DATA:  Hypoxia, dyspnea EXAM: PORTABLE CHEST 1 VIEW COMPARISON:  11/18/2018 chest radiograph. FINDINGS: Stable cardiomediastinal silhouette with normal heart size. No pneumothorax. No pleural effusion. New patchy right upper lung opacity. Stable elevation of the anterior right hemidiaphragm. IMPRESSION: New patchy right upper lung opacity compatible with pneumonia. Chest radiograph follow-up advised. Electronically Signed   By: Delbert Phenix M.D.   On:  01/22/2019 10:37    Pending Labs Unresulted Labs (From admission, onward)    Start     Ordered   01/22/19 1004  Culture, respiratory  Once,   STAT     01/22/19 1004   01/22/19 0930  Urine culture  ONCE - STAT,   STAT    Question:  Patient immune status  Answer:  Normal   01/22/19 0930   01/22/19 0929  Blood culture (routine x 2)  BLOOD CULTURE X 2,   STAT    Question:  Patient immune status  Answer:  Normal   01/22/19 0930          Vitals/Pain Today's Vitals   01/22/19 1047 01/22/19 1047 01/22/19 1047 01/22/19 1239  BP: (!) 140/54   (!) 143/87  Pulse: 84   78  Resp: (!) 25   (!) 23  Temp:  99.2 F (37.3 C)    TempSrc:  Oral    SpO2: 94%   99%  Weight:      Height:      PainSc:   0-No pain     Isolation Precautions No active isolations  Medications Medications  ceFEPIme (MAXIPIME) 2 g in sodium chloride 0.9 % 100 mL IVPB (0 g Intravenous Stopped 01/22/19 1025)  metroNIDAZOLE (FLAGYL) IVPB 500 mg (0 mg Intravenous Stopped 01/22/19 1058)  vancomycin (VANCOCIN) IVPB 1000 mg/200 mL premix (0 mg Intravenous  Stopped 01/22/19 1146)  sodium chloride 0.9 % bolus 1,000 mL (0 mLs Intravenous Stopped 01/22/19 1046)  acetaminophen (TYLENOL) tablet 1,000 mg (1,000 mg Oral Given 01/22/19 0949)  ondansetron (ZOFRAN) injection 4 mg (4 mg Intravenous Given 01/22/19 0953)    Mobility walks with device Low fall risk   Focused Assessments Cardiac Assessment Handoff:    Lab Results  Component Value Date   TROPONINI 0.03 (HH) 01/22/2019   No results found for: DDIMER Does the Patient currently have chest pain? No     R Recommendations: See Admitting Provider Note  Report given to:   Additional Notes: Covid negative

## 2019-01-22 NOTE — Progress Notes (Signed)
A consult was received from an ED physician for vanc/cefepime per pharmacy dosing.  The patient's profile has been reviewed for ht/wt/allergies/indication/available labs.   A one time order has been placed for vanc 1g and cefeime 2g.  Further antibiotics/pharmacy consults should be ordered by admitting physician if indicated.                       Thank you, Berkley Harvey 01/22/2019  9:41 AM

## 2019-01-22 NOTE — Progress Notes (Addendum)
Bradycardia with heart rate as low as 30's and 40's.  ECG completed and paged MD.

## 2019-01-22 NOTE — H&P (Signed)
History and Physical    Jackson [name]:[id number] DOB: 1928/12/18 DOA: 01/22/2019  PCP: Marden Noble, MD Patient coming from: Home  Chief Complaint: Shortness of breath  HPI: Jackson [name] is a 85 y.o. male with medical history significant of with past medical history of diabetes mellitus type 2, essential hypertension, hyperlipidemia, GERD, chronic sinusitis came to the hospital for evaluation of one episode of vomiting followed by some cough and shortness of breath.  He denies any fevers but report of subjective chills.  Lives at home with his wife and denies any contact with person with known COVID.  He was admitted back here in March with similar issues and was diagnosed with pneumonia and bronchitis.  No other complaints. - In the ER patient was noted to be febrile, slightly tachycardic with relative leukocytosis.  X-ray was concerning for pneumonia in the right upper lobe.  Medical team was requested to admit the patient.  Patient tells me off and on eat spicy food and lites his coffee.  He takes his Prilosec in the morning but not always 30 minutes before meals.  He also admits of recurring issues with his sinuses.  In the ER he was desaturating to 83% on room air therefore had to be placed on supplemental oxygen.   Review of Systems: As per HPI otherwise 10 point review of systems negative.  Review of Systems Otherwise negative except as per HPI, including: General: Denies fever, chills, night sweats or unintended weight loss. Resp: Denies hemoptysis Cardiac: Denies chest pain, palpitations, orthopnea, paroxysmal nocturnal dyspnea. GI: Denies abdominal pain, nausea, vomiting, diarrhea or constipation GU: Denies dysuria, frequency, hesitancy or incontinence MS: Denies muscle aches, joint pain or swelling Neuro: Denies headache, neurologic deficits (focal weakness, numbness, tingling), abnormal gait Psych: Denies anxiety, depression, SI/HI/AVH Skin: Denies new rashes or lesions  ID: Denies sick contacts, exotic exposures, travel  Past Medical History:  Diagnosis Date  . Aortic stenosis    Echo 3/18: mild aortic stenosis (mean 12/peak 22 // Echo 3/19: mild AS (mean 12, peak 20)   . Atherosclerosis of aorta (HCC) 12/16/2016   CXR 6/16: IMPRESSION: 1. Resolved left basilar airspace opacities. 2. Hazy nodularity in both lungs compatible with the patient's known pleural plaques. 3. Right anterior hemidiaphragmatic eventration. 4. Thoracic spondylosis. 5. Atherosclerotic calcification of the aortic arch.  . Bradycardia    Low HR (30s) noted during sleep during admx for sepsis in 2016; Mobitz 1, 2:1 block >> no indication for pacer; management of OSA recommended // transient CHB noted on ECG during admit in 10/2018   . Diabetes mellitus without complication (HCC)   . Diabetic neuropathy (HCC)   . DJD (degenerative joint disease)    knee, hip  . Echocardiogram    Echo 6/16: mod LVH, EF 65-70, vigorous LVF, no RWMA, mild MR, mild LAE // Echo 3/18: EF 60-65, Gr 1 DD, mild AS, PASP 47 // Echo 3/19: EF 60-65, Gr 1 DD, mild AS (mean 12) // Echo 7/19: EF 55-60, Gr 1 DD, PASP 36  . GERD (gastroesophageal reflux disease)   . Heart murmur   . HLD (hyperlipidemia)   . Hypertension   . Hyperthyroidism   . Lexiscan Myoview 06/2018   Normal perfusion, EF 81; Low Risk  . Mild diastolic dysfunction 11/18/2018   Noted on Echocardiogram; Patient does not require diuretic Rx  . Phlebitis of left leg   . Pneumonia 02/2015   admx with sepsis     Past Surgical History:  Procedure Laterality Date  . CATARACT EXTRACTION Right   . CIRCUMCISION    . FRACTURE SURGERY    . TOTAL HIP ARTHROPLASTY Left 09/24/2017   Procedure: LEFT TOTAL HIP ARTHROPLASTY ANTERIOR APPROACH;  Surgeon: Kathryne Hitch, MD;  Location: [facility];  Service: Orthopedics;  Laterality: Left;    SOCIAL HISTORY:  reports that he quit smoking about 60 years ago. He has never used smokeless tobacco. He reports that  he does not drink alcohol or use drugs.  Allergies  Allergen Reactions  . Actos [Pioglitazone] Swelling  . Formaldehyde Other (See Comments)    Flu-like symptoms  . Lipitor [Atorvastatin Calcium]   . Metformin And Related     FAMILY HISTORY: Family History  Problem Relation Age of Onset  . Diabetes Mother   . Diabetes Sister   . Diabetes Brother   . Heart attack Neg Hx   . Stroke Neg Hx   . Hypertension Neg Hx      Prior to Admission medications   Medication Sig Start Date End Date Taking? Authorizing Provider  ACCU-CHEK AVIVA PLUS test strip  07/30/17  Yes [provider]  albuterol (PROVENTIL HFA;VENTOLIN HFA) 108 (90 Base) MCG/ACT inhaler Inhale 2 puffs into the lungs every 6 (six) hours as needed for wheezing or shortness of breath. 03/30/18  Yes Emokpae, Courage, MD  aspirin EC 81 MG tablet Take 81 mg by mouth daily.   Yes [provider]  Calcium Carbonate-Vitamin D (CALCIUM 600+D PO) Take 1 tablet by mouth daily.   Yes [provider]  fluticasone (FLONASE) 50 MCG/ACT nasal spray Place 2 sprays into both nostrils 2 (two) times daily. 11/20/18  Yes Arrien, York Ram, MD  gabapentin (NEURONTIN) 100 MG capsule Take 300 mg by mouth at bedtime.    Yes [provider]  insulin aspart (NOVOLOG FLEXPEN) 100 UNIT/ML FlexPen Before each meal 3 times a day, 140-199 - 2 units, 200-250 - 4 units, 251-299 - 6 units,  300-349 - 8 units,  350 or above 10 units. 02/28/15  Yes Albertine Grates, MD  Insulin Glargine (LANTUS) 100 UNIT/ML Solostar Pen Inject 15 Units into the skin daily at 10 pm.   Yes [provider]  lisinopril (PRINIVIL,ZESTRIL) 5 MG tablet Take 1 tablet (5 mg total) by mouth daily. 12/26/18  Yes Patwardhan, Manish J, MD  Multiple Vitamins-Minerals (MULTIVITAMIN PO) Take 1 tablet by mouth daily. Centrum Silver   Yes [provider]  omeprazole (PRILOSEC) 20 MG capsule Take 20 mg by mouth daily.  10/20/16  Yes [provider]   rosuvastatin (CRESTOR) 10 MG tablet Take 1 tablet (10 mg total) by mouth at bedtime. 12/27/18  Yes Yates Decamp, MD  senna-docusate (SENOKOT-S) 8.6-50 MG tablet Take 2 tablets by mouth at bedtime. Patient taking differently: Take 1 tablet by mouth at bedtime.  03/30/18 03/30/19 Yes Emokpae, Courage, MD  sodium chloride (OCEAN) 0.65 % SOLN nasal spray Place 1 spray into both nostrils as needed for congestion. 11/20/18  Yes Arrien, York Ram, MD  tetrahydrozoline-zinc (VISINE-AC) 0.05-0.25 % ophthalmic solution Place 2 drops into both eyes 3 (three) times daily as needed (dry eyes).   Yes [provider]    Physical Exam: Vitals:   01/22/19 1239 01/22/19 1400 01/22/19 1405 01/22/19 1500  BP: (!) 143/87 (!) 127/39  (!) 137/44  Pulse: 78 67  (!) 57  Resp: (!) 23 19  (!) 0  Temp:   97.7 F (36.5 C)   TempSrc:   Oral  SpO2: 99% (!) 83%  100%  Weight:      Height:          Constitutional: NAD, calm, comfortable, 3 L nasal cannula Eyes: PERRL, lids and conjunctivae normal ENMT: Mucous membranes are moist. Posterior pharynx clear of any exudate or lesions.Normal dentition.  Neck: normal, supple, no masses, no thyromegaly Respiratory: Diffuse coarse breath sounds especially right upper lobe Cardiovascular: Regular rate and rhythm, no murmurs / rubs / gallops. No extremity edema. 2+ pedal pulses. No carotid bruits.  Abdomen: no tenderness, no masses palpated. No hepatosplenomegaly. Bowel sounds positive.  Musculoskeletal: no clubbing / cyanosis. No joint deformity upper and lower extremities. Good ROM, no contractures. Normal muscle tone.  Skin: no rashes, lesions, ulcers. No induration Neurologic: CN 2-12 grossly intact. Sensation intact, DTR normal. Strength 5/5 in all 4.  Psychiatric: Normal judgment and insight. Alert and oriented x 3. Normal mood.     Labs on Admission: I have personally reviewed following labs and imaging studies  CBC: Recent Labs  Lab 01/22/19 0940   WBC 13.0*  NEUTROABS 11.1*  HGB 13.9  HCT 42.0  MCV 93.8  PLT 230   Basic Metabolic Panel: Recent Labs  Lab 01/22/19 0940  NA 139  K 3.9  CL 103  CO2 27  GLUCOSE 236*  BUN 19  CREATININE 1.03  CALCIUM 9.6   GFR: Estimated Creatinine Clearance: 50.4 mL/min (by C-G formula based on SCr of 1.03 mg/dL). Liver Function Tests: Recent Labs  Lab 01/22/19 0940  AST 17  ALT 18  ALKPHOS 64  BILITOT 1.0  PROT 6.9  ALBUMIN 4.0   No results for input(s): LIPASE, AMYLASE in the last 168 hours. No results for input(s): AMMONIA in the last 168 hours. Coagulation Profile: No results for input(s): INR, PROTIME in the last 168 hours. Cardiac Enzymes: Recent Labs  Lab 01/22/19 0940  TROPONINI 0.03*   BNP (last 3 results) No results for input(s): PROBNP in the last 8760 hours. HbA1C: No results for input(s): HGBA1C in the last 72 hours. CBG: No results for input(s): GLUCAP in the last 168 hours. Lipid Profile: No results for input(s): CHOL, HDL, LDLCALC, TRIG, CHOLHDL, LDLDIRECT in the last 72 hours. Thyroid Function Tests: No results for input(s): TSH, T4TOTAL, FREET4, T3FREE, THYROIDAB in the last 72 hours. Anemia Panel: No results for input(s): VITAMINB12, FOLATE, FERRITIN, TIBC, IRON, RETICCTPCT in the last 72 hours. Urine analysis:    Component Value Date/Time   COLORURINE YELLOW 01/22/2019 0940   APPEARANCEUR CLEAR 01/22/2019 0940   LABSPEC 1.012 01/22/2019 0940   PHURINE 6.0 01/22/2019 0940   GLUCOSEU 50 (A) 01/22/2019 0940   HGBUR NEGATIVE 01/22/2019 0940   BILIRUBINUR NEGATIVE 01/22/2019 0940   KETONESUR NEGATIVE 01/22/2019 0940   PROTEINUR NEGATIVE 01/22/2019 0940   UROBILINOGEN 0.2 02/27/2015 1536   NITRITE NEGATIVE 01/22/2019 0940   LEUKOCYTESUR NEGATIVE 01/22/2019 0940   Sepsis Labs: !!!!!!!!!!!!!!!!!!!!!!!!!!!!!!!!!!!!!!!!!!!! @LABRCNTIP (procalcitonin:4,lacticidven:4) ) Recent Results (from the past 240 hour(s))  Expectorated sputum assessment w  rflx to resp cult     Status: None   Collection Time: 01/22/19 10:04 AM  Result Value Ref Range Status   Specimen Description SPUTUM  Final   Special Requests Normal  Final   Sputum evaluation   Final    THIS SPECIMEN IS ACCEPTABLE FOR SPUTUM CULTURE Performed at [facility], [address] [place], [place] [address]    Report Status 01/22/2019 FINAL  Final  SARS Coronavirus 2 (CEPHEID - Performed in [facility]  hospital lab), Hosp Order     Status: None   Collection Time: 01/22/19 10:04 AM  Result Value Ref Range Status   SARS Coronavirus 2 NEGATIVE NEGATIVE Final    Comment: (NOTE) If result is NEGATIVE SARS-CoV-2 target nucleic acids are NOT DETECTED. The SARS-CoV-2 RNA is generally detectable in upper and lower  respiratory specimens during the acute phase of infection. The lowest  concentration of SARS-CoV-2 viral copies this assay can detect is 250  copies / mL. A negative result does not preclude SARS-CoV-2 infection  and should not be used as the sole basis for treatment or other  patient management decisions.  A negative result may occur with  improper specimen collection / handling, submission of specimen other  than nasopharyngeal swab, presence of viral mutation(s) within the  areas targeted by this assay, and inadequate number of viral copies  (<250 copies / mL). A negative result must be combined with clinical  observations, patient history, and epidemiological information. If result is POSITIVE SARS-CoV-2 target nucleic acids are DETECTED. The SARS-CoV-2 RNA is generally detectable in upper and lower  respiratory specimens dur ing the acute phase of infection.  Positive  results are indicative of active infection with SARS-CoV-2.  Clinical  correlation with patient history and other diagnostic information is  necessary to determine patient infection status.  Positive results do  not rule out bacterial infection or co-infection with other  viruses. If result is PRESUMPTIVE POSTIVE SARS-CoV-2 nucleic acids MAY BE PRESENT.   A presumptive positive result was obtained on the submitted specimen  and confirmed on repeat testing.  While 2019 novel coronavirus  (SARS-CoV-2) nucleic acids may be present in the submitted sample  additional confirmatory testing may be necessary for epidemiological  and / or clinical management purposes  to differentiate between  SARS-CoV-2 and other Sarbecovirus currently known to infect humans.  If clinically indicated additional testing with an alternate test  methodology [phone]) is advised. The SARS-CoV-2 RNA is generally  detectable in upper and lower respiratory sp ecimens during the acute  phase of infection. The expected result is Negative. Fact Sheet for Patients:  BoilerBrush.com.cy Fact Sheet for Healthcare Providers: https://pope.com/ This test is not yet approved or cleared by the [place] FDA and has been authorized for detection and/or diagnosis of SARS-CoV-2 by FDA under an Emergency Use Authorization (EUA).  This EUA will remain in effect (meaning this test can be used) for the duration of the COVID-19 declaration under Section 564(b)(1) of the Act, 21 U.S.C. section 360bbb-3(b)(1), unless the authorization is terminated or revoked sooner. Performed at [facility], [address], [place], [place] [address]   Culture, respiratory     Status: None (Preliminary result)   Collection Time: 01/22/19 10:04 AM  Result Value Ref Range Status   Specimen Description   Final    SPUTUM Performed at [facility], [address] [place], [place] [address]    Special Requests   Final    Normal Reflexed from [phone] Performed at [facility], [address], [place], [place] [address]    Gram Stain   Final    RARE WBC PRESENT, PREDOMINANTLY PMN FEW GRAM POSITIVE COCCI IN PAIRS  FEW GRAM NEGATIVE RODS Performed at [facility], [address], [place], [place] [address]    Culture PENDING  Incomplete   Report Status PENDING  Incomplete  MRSA PCR Screening     Status: None   Collection Time:  01/22/19  1:24 PM  Result Value Ref Range Status   MRSA by PCR NEGATIVE NEGATIVE Final    Comment:        The GeneXpert MRSA Assay (FDA approved for NASAL specimens only), is one component of a comprehensive MRSA colonization surveillance program. It is not intended to diagnose MRSA infection nor to guide or monitor treatment for MRSA infections. Performed at [facility], [address] [place], [place] [id number]      Radiological Exams on Admission: Dg Chest Port 1 View  Result Date: 01/22/2019 CLINICAL DATA:  Hypoxia, dyspnea EXAM: PORTABLE CHEST 1 VIEW COMPARISON:  11/18/2018 chest radiograph. FINDINGS: Stable cardiomediastinal silhouette with normal heart size. No pneumothorax. No pleural effusion. New patchy right upper lung opacity. Stable elevation of the anterior right hemidiaphragm. IMPRESSION: New patchy right upper lung opacity compatible with pneumonia. Chest radiograph follow-up advised. Electronically Signed   By: Delbert PhenixJason A Poff M.D.   On: 01/22/2019 10:37     All images have been reviewed by me personally.    Assessment/Plan Principal Problem:   Sepsis due to pneumonia [facility](HCC) Active Problems:   Type 2 diabetes mellitus with hyperglycemia (HCC)   Essential hypertension   Dyslipidemia   Chronic diastolic CHF (congestive heart failure) (HCC)    Sepsis secondary to pneumonia, healthcare acquired, right upper lobe - Admit the patient for further hospital care.  Sepsis protocol initiated -Cultures ordered.  Chest x-ray suggestive of right upper lobe pneumonia.  Raises concerns for aspiration.  Grossly does not appear to be having coughing spells therefore we will let him eat today but get speech and swallow evaluation tomorrow.   There is also concerned that this could be from severe heartburn causing regurgitation or due to recurrent sinus issues - Aspiration precautions, bronchodilators, incentive spirometry and flutter valve.  IV vancomycin, Zosyn and azithromycin. -COVID test negative -Supportive care.  Supplemental oxygen as needed, wean off as deemed appropriate. Fluids with caution  Mild acute on chronic bronchitis -Suspect from pneumonia.  Continue bronchodilator treatment.  Hold off on steroids.  Chronic diastolic congestive heart failure, ejection fraction 60%, grade 1 diastolic dysfunction -Currently appears to be euvolemic.  Will closely monitor him.  Diabetes mellitus type 2 Peripheral neuropathy -Insulin with Accu-Chek and sliding scale.  Lantus 15 units daily -Continue gabapentin.  Essential hypertension -Continue lisinopril 5 mg daily  GERD -PPI daily 30 minutes before meals  Hyperlipidemia -Crestor   DVT prophylaxis: Lovenox Code Status: Full code Family Communication: None Disposition Plan: To be determined Consults called: None Admission status: Stepdown admission for closer monitoring, inpatient   Time Spent: 65 minutes.  >50% of the time was devoted to discussing the patients care, assessment, plan and disposition with other care givers along with counseling the patient about the risks and benefits of treatment.    Finian Helvey Joline Maxcyhirag Tra Wilemon MD [facility]  If 7PM-7AM, please contact night-coverage [email or web address]  01/22/2019, 3:56 PM

## 2019-01-22 NOTE — ED Provider Notes (Addendum)
[facility]-EMERGENCY DEPT Provider Note   CSN: [id number] Arrival date & time: 01/22/19  [id number]    History   Chief Complaint Chief Complaint  Patient presents with  . Emesis    HPI Jackson [name] is a 85 y.o. male.     HPI 85 year old male with past medical history as below here with fever, vomiting, and fatigue.  Patient states he was in his usual state of health upon going to bed last night.  He did have jalapeno for dinner, and has a history of reflux.  He awoke at 4 AM with vomiting.  He is unsure whether he was vomiting versus spitting up thick, blood-tinged sputum.  He had an associated cough.  He does not recall whether he aspirated or not.  Since then, he has had persistent nausea, vomiting, and generalized weakness.  He does not recall any specific sick contacts.  He does endorse some associated shortness of breath and feels generally weak.  He has had some generalized shaking today.  Denies any shortness of breath at rest.  No chest pain.  Denies any overt abdominal pain.  He does have a history of intermittent reflux as mentioned, but states that he has never been told or had to use oxygen in the past.  No other complaints.  Past Medical History:  Diagnosis Date  . Aortic stenosis    Echo 3/18: mild aortic stenosis (mean 12/peak 22 // Echo 3/19: mild AS (mean 12, peak 20)   . Atherosclerosis of aorta (HCC) 12/16/2016   CXR 6/16: IMPRESSION: 1. Resolved left basilar airspace opacities. 2. Hazy nodularity in both lungs compatible with the patient's known pleural plaques. 3. Right anterior hemidiaphragmatic eventration. 4. Thoracic spondylosis. 5. Atherosclerotic calcification of the aortic arch.  . Bradycardia    Low HR (30s) noted during sleep during admx for sepsis in 2016; Mobitz 1, 2:1 block >> no indication for pacer; management of OSA recommended // transient CHB noted on ECG during admit in 10/2018   . Diabetes mellitus without complication (HCC)   .  Diabetic neuropathy (HCC)   . DJD (degenerative joint disease)    knee, hip  . Echocardiogram    Echo 6/16: mod LVH, EF 65-70, vigorous LVF, no RWMA, mild MR, mild LAE // Echo 3/18: EF 60-65, Gr 1 DD, mild AS, PASP 47 // Echo 3/19: EF 60-65, Gr 1 DD, mild AS (mean 12) // Echo 7/19: EF 55-60, Gr 1 DD, PASP 36  . GERD (gastroesophageal reflux disease)   . Heart murmur   . HLD (hyperlipidemia)   . Hypertension   . Hyperthyroidism   . Lexiscan Myoview 06/2018   Normal perfusion, EF 81; Low Risk  . Mild diastolic dysfunction 11/18/2018   Noted on Echocardiogram; Patient does not require diuretic Rx  . Phlebitis of left leg   . Pneumonia 02/2015   admx with sepsis     Patient Active Problem List   Diagnosis Date Noted  . Essential hypertension 11/18/2018  . Dyslipidemia 11/18/2018  . Chronic diastolic CHF (congestive heart failure) (HCC) 11/18/2018  . Status post total replacement of left hip 09/24/2017  . Pain in left hip 08/11/2017  . Unilateral primary osteoarthritis, left hip 08/11/2017  . Bradycardia 12/16/2016  . Atherosclerosis of aorta (HCC) 12/16/2016  . Aortic stenosis   . Severe sepsis (HCC) 02/25/2015  . Bilateral pneumonia 02/25/2015  . Lactic acidosis 02/25/2015  . Hypoxia 02/25/2015  . Sepsis due to pneumonia (HCC) 02/25/2015  . Acute  respiratory failure with hypoxia (HCC)   . CAP (community acquired pneumonia)   . SOB (shortness of breath)   . Type 2 diabetes mellitus with hyperglycemia [facility])     Past Surgical History:  Procedure Laterality Date  . CATARACT EXTRACTION Right   . CIRCUMCISION    . FRACTURE SURGERY    . TOTAL HIP ARTHROPLASTY Left 09/24/2017   Procedure: LEFT TOTAL HIP ARTHROPLASTY ANTERIOR APPROACH;  Surgeon: Kathryne Hitch, MD;  Location: [facility]S;  Service: Orthopedics;  Laterality: Left;        Home Medications    Prior to Admission medications   Medication Sig Start Date End Date Taking? Authorizing Provider  ACCU-CHEK AVIVA  PLUS test strip  07/30/17   [provider]  albuterol (PROVENTIL HFA;VENTOLIN HFA) 108 (90 Base) MCG/ACT inhaler Inhale 2 puffs into the lungs every 6 (six) hours as needed for wheezing or shortness of breath. 03/30/18   Shon Hale, MD  Alcohol Swabs (B-D SINGLE USE SWABS REGULAR) PADS  07/12/17   [provider]  aspirin EC 81 MG tablet Take 81 mg by mouth daily.    [provider]  Calcium Carbonate-Vitamin D (CALCIUM 600+D PO) Take 1 tablet by mouth daily.    [provider]  fluticasone (FLONASE) 50 MCG/ACT nasal spray Place 2 sprays into both nostrils 2 (two) times daily. 11/20/18   Arrien, York Ram, MD  gabapentin (NEURONTIN) 100 MG capsule Take 300 mg by mouth at bedtime.     [provider]  insulin aspart (NOVOLOG FLEXPEN) 100 UNIT/ML FlexPen Before each meal 3 times a day, 140-199 - 2 units, 200-250 - 4 units, 251-299 - 6 units,  300-349 - 8 units,  350 or above 10 units. 02/28/15   Albertine Grates, MD  Insulin Glargine (LANTUS) 100 UNIT/ML Solostar Pen Inject 15 Units into the skin daily at 10 pm.    [provider]  lisinopril (PRINIVIL,ZESTRIL) 5 MG tablet Take 1 tablet (5 mg total) by mouth daily. 12/26/18   Patwardhan, Anabel Bene, MD  Multiple Vitamins-Minerals (MULTIVITAMIN PO) Take 1 tablet by mouth daily. Centrum Silver    [provider]  omeprazole (PRILOSEC) 20 MG capsule Take 20 mg by mouth daily as needed (reflux).  10/20/16   [provider]  rosuvastatin (CRESTOR) 10 MG tablet Take 1 tablet (10 mg total) by mouth at bedtime. 12/27/18   Yates Decamp, MD  senna-docusate (SENOKOT-S) 8.6-50 MG tablet Take 2 tablets by mouth at bedtime. Patient taking differently: Take 1 tablet by mouth at bedtime.  03/30/18 03/30/19  Shon Hale, MD  sodium chloride (OCEAN) 0.65 % SOLN nasal spray Place 1 spray into both nostrils as needed for congestion. 11/20/18   Arrien, York Ram, MD  tetrahydrozoline-zinc (VISINE-AC)  0.05-0.25 % ophthalmic solution Place 2 drops into both eyes 3 (three) times daily as needed (dry eyes).    [provider]    Family History Family History  Problem Relation Age of Onset  . Diabetes Mother   . Diabetes Sister   . Diabetes Brother   . Heart attack Neg Hx   . Stroke Neg Hx   . Hypertension Neg Hx     Social History Social History   Tobacco Use  . Smoking status: Former Smoker    Last attempt to quit: 1960    Years since quitting: 60.3  . Smokeless tobacco: Never Used  Substance Use Topics  . Alcohol use: No  . Drug use: No  Allergies   Actos [pioglitazone]; Formaldehyde; Lipitor [atorvastatin calcium]; and Metformin and related   Review of Systems Review of Systems  Constitutional: Positive for fatigue and fever. Negative for chills.  HENT: Negative for congestion and rhinorrhea.   Eyes: Negative for visual disturbance.  Respiratory: Positive for cough and shortness of breath. Negative for wheezing.   Cardiovascular: Negative for chest pain and leg swelling.  Gastrointestinal: Positive for nausea and vomiting. Negative for abdominal pain and diarrhea.  Genitourinary: Negative for dysuria and flank pain.  Musculoskeletal: Negative for neck pain and neck stiffness.  Skin: Negative for rash and wound.  Allergic/Immunologic: Negative for immunocompromised state.  Neurological: Positive for weakness. Negative for syncope, numbness and headaches.  Hematological: Does not bruise/bleed easily.  All other systems reviewed and are negative.    Physical Exam Updated Vital Signs BP (!) 174/89 (BP Location: Left Arm)   Pulse 72   Temp 99.2 F (37.3 C) (Oral)   Resp 16   Ht 5' 9.5" (1.765 m)   Wt 83.5 kg   SpO2 (!) 87% Comment: 94 on 2L [place]  BMI 26.78 kg/m   Physical Exam Vitals signs and nursing note reviewed.  Constitutional:      General: He is not in acute distress.    Appearance: He is well-developed. He is ill-appearing.  HENT:      Head: Normocephalic and atraumatic.     Mouth/Throat:     Comments: Mildly dry MM Eyes:     Conjunctiva/sclera: Conjunctivae normal.  Neck:     Musculoskeletal: Neck supple.  Cardiovascular:     Rate and Rhythm: Normal rate and regular rhythm.     Heart sounds: Normal heart sounds. No murmur. No friction rub.  Pulmonary:     Effort: Pulmonary effort is normal. Tachypnea present. No respiratory distress.     Breath sounds: Examination of the right-middle field reveals rhonchi. Examination of the left-middle field reveals rhonchi. Examination of the right-lower field reveals rhonchi. Examination of the left-lower field reveals rhonchi. Rhonchi present. No wheezing or rales.  Abdominal:     General: There is no distension.     Palpations: Abdomen is soft.     Tenderness: There is no abdominal tenderness.  Skin:    General: Skin is warm.     Capillary Refill: Capillary refill takes less than 2 seconds.  Neurological:     Mental Status: He is alert and oriented to person, place, and time.     Motor: No abnormal muscle tone.      ED Treatments / Results  Labs (all labs ordered are listed, but only abnormal results are displayed) Labs Reviewed  CBC WITH DIFFERENTIAL/PLATELET - Abnormal; Notable for the following components:      Result Value   WBC 13.0 (*)    Neutro Abs 11.1 (*)    Lymphs Abs 0.6 (*)    Monocytes Absolute 1.2 (*)    All other components within normal limits  COMPREHENSIVE METABOLIC PANEL - Abnormal; Notable for the following components:   Glucose, Bld 236 (*)    All other components within normal limits  TROPONIN I - Abnormal; Notable for the following components:   Troponin I 0.03 (*)    All other components within normal limits  URINALYSIS, ROUTINE W REFLEX MICROSCOPIC - Abnormal; Notable for the following components:   Glucose, UA 50 (*)    All other components within normal limits  EXPECTORATED SPUTUM ASSESSMENT W REFEX TO RESP CULTURE  CULTURE, BLOOD  (ROUTINE X  2)  CULTURE, BLOOD (ROUTINE X 2)  URINE CULTURE  SARS CORONAVIRUS 2 (HOSPITAL ORDER, PERFORMED IN [facility])  CULTURE, RESPIRATORY  LACTIC ACID, PLASMA  BLOOD GAS, ARTERIAL    EKG EKG Interpretation  Date/Time:  Sunday Jan 22 2019 09:16:27 EDT Ventricular Rate:  75 PR Interval:    QRS Duration: 91 QT Interval:  366 QTC Calculation: 409 R Axis:   -78 Text Interpretation:  Complete heart block with junctional bradycardia Left anterior fascicular block Consider anterior infarct Since last EKG, CHB remains evident Confirmed by Shaune Pollack [phone]) on 01/22/2019 9:40:54 AM   Radiology Dg Chest Port 1 View  Result Date: 01/22/2019 CLINICAL DATA:  Hypoxia, dyspnea EXAM: PORTABLE CHEST 1 VIEW COMPARISON:  11/18/2018 chest radiograph. FINDINGS: Stable cardiomediastinal silhouette with normal heart size. No pneumothorax. No pleural effusion. New patchy right upper lung opacity. Stable elevation of the anterior right hemidiaphragm. IMPRESSION: New patchy right upper lung opacity compatible with pneumonia. Chest radiograph follow-up advised. Electronically Signed   By: Delbert Phenix M.D.   On: 01/22/2019 10:37    Procedures .Critical Care Performed by: Shaune Pollack, MD Authorized by: Shaune Pollack, MD   Critical care provider statement:    Critical care time (minutes):  45   Critical care time was exclusive of:  Separately billable procedures and treating other patients and teaching time   Critical care was necessary to treat or prevent imminent or life-threatening deterioration of the following conditions:  Circulatory failure, cardiac failure and respiratory failure   Critical care was time spent personally by me on the following activities:  Development of treatment plan with patient or surrogate, discussions with consultants, evaluation of patient's response to treatment, examination of patient, obtaining history from patient or surrogate, ordering and  performing treatments and interventions, ordering and review of laboratory studies, ordering and review of radiographic studies, pulse oximetry, re-evaluation of patient's condition and review of old charts   I assumed direction of critical care for this patient from another provider in my specialty: no     (including critical care time)  Medications Ordered in ED Medications  metroNIDAZOLE (FLAGYL) IVPB 500 mg (500 mg Intravenous New Bag/Given 01/22/19 0958)  vancomycin (VANCOCIN) IVPB 1000 mg/200 mL premix (1,000 mg Intravenous New Bag/Given 01/22/19 1046)  ceFEPIme (MAXIPIME) 2 g in sodium chloride 0.9 % 100 mL IVPB (0 g Intravenous Stopped 01/22/19 1025)  sodium chloride 0.9 % bolus 1,000 mL (0 mLs Intravenous Stopped 01/22/19 1046)  acetaminophen (TYLENOL) tablet 1,000 mg (1,000 mg Oral Given 01/22/19 0949)  ondansetron (ZOFRAN) injection 4 mg (4 mg Intravenous Given 01/22/19 0953)     Initial Impression / Assessment and Plan / ED Course  I have reviewed the triage vital signs and the nursing notes.  Pertinent labs & imaging results that were available during my care of the patient were reviewed by me and considered in my medical decision making (see chart for details).  Clinical Course as of Jan 22 1047  Sun Jan 22, 2019  0942 85 yo M here w/ fever, hypoxia, nausea, vomiting. Suspect sepsis, likely aspiration PNA, CAP, possibly COVID-19. Pt also w/ vomiting but this appears more so 2/2 sputum swallowing, and he has no abd TTP. Will consider further imaging pending CXR. IVF, ABX started. Cautious fluids in setting of CHF.   [CI]  [id number] Interestingly has persistent CHB with JR, which has been seen by Cardiology and noted as recently as March 2020  ED EKG 12-Lead [CI]  0954 Tylenol  given.  Temp(!): 103.4 F (39.7 C) [CI]  1006 Consistent with sepsis/acute inflammation. Hgb normal, doubt significant GIB.  CBC with Differential(!) [CI]  1017 D/w wife, Geoffery Aultman - pt has h/o similar  presentations, she is in agreement w/ plan for ABX, admission. Pt is DNR per February and confirmed this with wife.   [CI]  1029 Overall reassuring.  Normal renal function.  Mild hyperglycemia.  Comprehensive metabolic panel(!) [CI]  1029 History of previous mild elevations.  I suspect this is demand related.  No ischemia on EKG.  Troponin I - ONCE - STAT(!!) [CI]  1038 Chest x-ray reviewed by myself, concerning for dense right-sided pneumonia, consistent with his aspiration history.  Admit to medicine.   [CI]  1047 ABG c/w hypoxia, no signs of significant resp acidosis.    [CI]  1047 Reassuring, will be cautious on fluids.  Lactic acid, plasma [CI]    Clinical Course User Index [CI] Shaune Pollack, MD         Final Clinical Impressions(s) / ED Diagnoses   Final diagnoses:  Acute respiratory failure with hypoxia (HCC)  Sepsis due to pneumonia [facility])    ED Discharge Orders    None       Shaune Pollack, MD 01/22/19 1039    Shaune Pollack, MD 01/22/19 1048

## 2019-01-22 NOTE — Progress Notes (Signed)
Pharmacy Antibiotic Note  Jackson [name] is a 85 y.o. male admitted on 01/22/2019 with aspiration PNA.  Pharmacy has been consulted for vancomycin and Zosyn dosing. Azithromycin per MD  Plan:  Per discussion with MD, will hold vanc since PCR already back negative  Zosyn 3.375 g IV given every 8 hrs by 4-hr infusion  Pharmacy will sign off, following peripherally for renal dose adjustments and culture results  Height: 5' 9.5" (176.5 cm) Weight: 184 lb (83.5 kg) IBW/kg (Calculated) : 71.85  Temp (24hrs), Avg:100.1 F (37.8 C), Min:97.7 F (36.5 C), Max:103.4 F (39.7 C)  Recent Labs  Lab 01/22/19 0940  WBC 13.0*  CREATININE 1.03  LATICACIDVEN 1.2    Estimated Creatinine Clearance: 50.4 mL/min (by C-G formula based on SCr of 1.03 mg/dL).    Allergies  Allergen Reactions  . Actos [Pioglitazone] Swelling  . Formaldehyde Other (See Comments)    Flu-like symptoms  . Lipitor [Atorvastatin Calcium]   . Metformin And Related     Antimicrobials this admission: 5/3 Vanc/Cefepime/Flagyl x 1 5/3 Zosyn >>  5/3 Zmax (MD) >>  Dose adjustments this admission: n/a  Microbiology results: 5/3 BCx: ngtd 5/3 UCx: sent  5/3 Sputum: few GPC pairs, few GNR 5/3 COVID: neg  5/3 MRSA PCR: neg  Thank you for allowing pharmacy to be a part of this patient's care.  Bernadene Person, PharmD, BCPS [phone] 01/22/2019, 4:35 PM

## 2019-01-22 NOTE — ED Triage Notes (Signed)
Patient is coming from home. Pt reports emesis this morning after eating jalepeno poppers. Patient's wife and son report that patient has emesis with some blood in it whenever he eats anything spicy. Patient reportedly not on oxygen at home but is in the 80's today. Patient placed on 2L [place] by EMS.

## 2019-01-22 NOTE — ED Notes (Signed)
Bed: WA20 Expected date:  Expected time:  Means of arrival:  Comments: 85 yo N/V

## 2019-01-22 NOTE — ED Notes (Signed)
Condom cath has been placed. Secured with tape to leg.

## 2019-01-23 LAB — COMPREHENSIVE METABOLIC PANEL
ALT: 12 U/L (ref 0–44)
AST: 14 U/L — ABNORMAL LOW (ref 15–41)
Albumin: 3.2 g/dL — ABNORMAL LOW (ref 3.5–5.0)
Alkaline Phosphatase: 50 U/L (ref 38–126)
Anion gap: 7 (ref 5–15)
BUN: 22 mg/dL (ref 8–23)
CO2: 28 mmol/L (ref 22–32)
Calcium: 8.9 mg/dL (ref 8.9–10.3)
Chloride: 103 mmol/L (ref 98–111)
Creatinine, Ser: 1.25 mg/dL — ABNORMAL HIGH (ref 0.61–1.24)
GFR calc Af Amer: 59 mL/min — ABNORMAL LOW (ref >60)
GFR calc non Af Amer: 51 mL/min — ABNORMAL LOW (ref >60)
Glucose, Bld: 144 mg/dL — ABNORMAL HIGH (ref 70–99)
Potassium: 4.2 mmol/L (ref 3.5–5.1)
Sodium: 138 mmol/L (ref 135–145)
Total Bilirubin: 0.7 mg/dL (ref 0.3–1.2)
Total Protein: 5.7 g/dL — ABNORMAL LOW (ref 6.5–8.1)

## 2019-01-23 LAB — URINE CULTURE
Culture: NO GROWTH
Special Requests: NORMAL

## 2019-01-23 LAB — GLUCOSE, CAPILLARY
Glucose-Capillary: 156 mg/dL — ABNORMAL HIGH (ref 70–99)
Glucose-Capillary: 254 mg/dL — ABNORMAL HIGH (ref 70–99)
Glucose-Capillary: 234 mg/dL — ABNORMAL HIGH (ref 70–99)
Glucose-Capillary: 125 mg/dL — ABNORMAL HIGH (ref 70–99)

## 2019-01-23 LAB — CBC
HCT: 37.1 % — ABNORMAL LOW (ref 39.0–52.0)
Hemoglobin: 11.5 g/dL — ABNORMAL LOW (ref 13.0–17.0)
MCH: 30.3 pg (ref 26.0–34.0)
MCHC: 31 g/dL (ref 30.0–36.0)
MCV: 97.6 fL (ref 80.0–100.0)
Platelets: 226 10*3/uL (ref 150–400)
RBC: 3.8 MIL/uL — ABNORMAL LOW (ref 4.22–5.81)
RDW: 13.2 % (ref 11.5–15.5)
WBC: 11.5 10*3/uL — ABNORMAL HIGH (ref 4.0–10.5)
nRBC: 0 % (ref 0.0–0.2)

## 2019-01-23 MED ORDER — IPRATROPIUM BROMIDE 0.02 % IN SOLN
0.5000 mg | Freq: Three times a day (TID) | RESPIRATORY_TRACT | Status: DC
  Administered 2019-01-24 (×2): 0.5 mg via RESPIRATORY_TRACT
  Filled 2019-01-23 (×2): qty 2.5

## 2019-01-23 MED ORDER — LEVALBUTEROL HCL 0.63 MG/3ML IN NEBU
0.6300 mg | INHALATION_SOLUTION | Freq: Three times a day (TID) | RESPIRATORY_TRACT | Status: DC
  Administered 2019-01-24 (×2): 0.63 mg via RESPIRATORY_TRACT
  Filled 2019-01-23 (×2): qty 3

## 2019-01-23 MED ORDER — AZITHROMYCIN 250 MG PO TABS
500.0000 mg | ORAL_TABLET | Freq: Every day | ORAL | Status: DC
  Administered 2019-01-23: 17:00:00 500 mg via ORAL
  Filled 2019-01-23: qty 2

## 2019-01-23 NOTE — Evaluation (Signed)
Clinical/Bedside Swallow Evaluation Patient Details  Name: Jackson [name] MRN: [id number] Date of Birth: July 10, 1929  Today's Date: 01/23/2019 Time: SLP Start Time (ACUTE ONLY): 1340 SLP Stop Time (ACUTE ONLY): 1416 SLP Time Calculation (min) (ACUTE ONLY): 36 min  Past Medical History:  Past Medical History:  Diagnosis Date  . Aortic stenosis    Echo 3/18: mild aortic stenosis (mean 12/peak 22 // Echo 3/19: mild AS (mean 12, peak 20)   . Atherosclerosis of aorta (HCC) 12/16/2016   CXR 6/16: IMPRESSION: 1. Resolved left basilar airspace opacities. 2. Hazy nodularity in both lungs compatible with the patient's known pleural plaques. 3. Right anterior hemidiaphragmatic eventration. 4. Thoracic spondylosis. 5. Atherosclerotic calcification of the aortic arch.  . Bradycardia    Low HR (30s) noted during sleep during admx for sepsis in 2016; Mobitz 1, 2:1 block >> no indication for pacer; management of OSA recommended // transient CHB noted on ECG during admit in 10/2018   . Diabetes mellitus without complication (HCC)   . Diabetic neuropathy (HCC)   . DJD (degenerative joint disease)    knee, hip  . Echocardiogram    Echo 6/16: mod LVH, EF 65-70, vigorous LVF, no RWMA, mild MR, mild LAE // Echo 3/18: EF 60-65, Gr 1 DD, mild AS, PASP 47 // Echo 3/19: EF 60-65, Gr 1 DD, mild AS (mean 12) // Echo 7/19: EF 55-60, Gr 1 DD, PASP 36  . GERD (gastroesophageal reflux disease)   . Heart murmur   . HLD (hyperlipidemia)   . Hypertension   . Hyperthyroidism   . Lexiscan Myoview 06/2018   Normal perfusion, EF 81; Low Risk  . Mild diastolic dysfunction 11/18/2018   Noted on Echocardiogram; Patient does not require diuretic Rx  . Phlebitis of left leg   . Pneumonia 02/2015   admx with sepsis    Past Surgical History:  Past Surgical History:  Procedure Laterality Date  . CATARACT EXTRACTION Right   . CIRCUMCISION    . FRACTURE SURGERY    . TOTAL HIP ARTHROPLASTY Left 09/24/2017   Procedure: LEFT TOTAL  HIP ARTHROPLASTY ANTERIOR APPROACH;  Surgeon: Kathryne Hitch, MD;  Location: [facility];  Service: Orthopedics;  Laterality: Left;   HPI:  Jackson [name] is a 85 yo male adm to [facility] with respiratory deficits after waking up coughing causing vomiting.  He reports he consumed  jalapeno poppers at 6 pm, followed by nuts and a chocolate bar later in the evening and he questions this causing reflux.  Jackson [name] has h/o sinusitis, GERD, hip replacement, asbestos exposure, prior smoker *quit 60 years ago, DM, cardiac disease.  He has had 2 other admits with similar situations (Feb 2020, July 2019) when he had left lobe pna.  Jackson [name] CXR showed RLL Pna during this admit.  Jackson [name] states has has taken prescription reflux medicine since 2019 and prior this this OTC Prilosec x5 years.  He states he only has symptoms when he eats spicy foods.  His wife uses Arts development officer at home in the bedroom which Jackson [name] states has decreased his sinusitis.     Assessment / Plan / Recommendation Clinical Impression  Jackson [name] presents with functional oropharyngeal swallow ability clnically.  No indication of airway compromise, Jackson [name] passed 3 ounce Yale water test and tolerated all po well.  His swallow is timely and without indications of oropharygneal residuals.  Jackson [name] admits every pna episode in the last 11 months has been caused by him waking with coughing that elicits vomiting.  He does  admit to one occasion when he had difficulty clearing meat pointing to his mid sternum- He stated it cleared with a rest break.  In addition, he reports occasional problems with soda "bubbling up" in his throat.  He also was noted to frequently belch during the session without awareness but stated it was "dry".  SlP will follow up x1 to provide Jackson [name] with education regarding reflux mitigation strategies.   SLP Visit Diagnosis: Dysphagia, unspecified (R13.10)    Aspiration Risk  Mild aspiration risk    Diet Recommendation Regular;Thin liquid(small frequent meals)   Liquid Administration  via: Cup;Straw Medication Administration: Whole meds with liquid Supervision: Patient able to self feed Compensations: Slow rate;Small sips/bites Postural Changes: Seated upright at 90 degrees;Remain upright for at least 30 minutes after po intake    Other  Recommendations Oral Care Recommendations: Oral care QID   Follow up Recommendations        Frequency and Duration min 1 x/week  1 week       Prognosis Prognosis for Safe Diet Advancement: Good      Swallow Study   General Date of Onset: 01/23/19 HPI: Jackson [name] is a 85 yo male adm to [facility] with respiratory deficits after waking up coughing causing vomiting.  He reports he consumed  jalapeno poppers at 6 pm, followed by nuts and a chocolate bar later in the evening and he questions this causing reflux.  Jackson [name] has h/o sinusitis, GERD, hip replacement, asbestos exposure, prior smoker *quit 60 years ago, DM, cardiac disease.  He has had 2 other admits with similar situations (Feb 2020, July 2019) when he had left lobe pna.  Jackson [name] CXR showed RLL Pna during this admit.  Jackson [name] states has has taken prescription reflux medicine since 2019 and prior this this OTC Prilosec x5 years.  He states he only has symptoms when he eats spicy foods.  His wife uses Arts development officer at home in the bedroom which Jackson [name] states has decreased his sinusitis.   Type of Study: Bedside Swallow Evaluation Previous Swallow Assessment: none Diet Prior to this Study: Regular;Thin liquids Temperature Spikes Noted: No Respiratory Status: Nasal cannula History of Recent Intubation: No Behavior/Cognition: Alert;Cooperative;Pleasant mood Oral Cavity Assessment: Other (comment)(upper partial, otherwise all intact) Oral Cavity - Dentition: (upper partial, otherwise all intact) Vision: Functional for self-feeding Self-Feeding Abilities: Able to feed self Patient Positioning: Upright in bed Baseline Vocal Quality: Normal Volitional Cough: Strong Volitional Swallow: Able to elicit     Oral/Motor/Sensory Function Overall Oral Motor/Sensory Function: Within functional limits   Ice Chips Ice chips: Not tested   Thin Liquid Thin Liquid: Within functional limits Presentation: Straw;Cup    Nectar Thick Nectar Thick Liquid: Not tested   Honey Thick Honey Thick Liquid: Not tested   Puree Puree: Not tested   Solid     Solid: Within functional limits Presentation: Self [place] July 01/23/2019,3:44 PM   Donavan Burnet, MS [facility] SLP Acute Rehab Services Pager [phone] Office [phone]

## 2019-01-23 NOTE — Progress Notes (Signed)
PHARMACIST - PHYSICIAN COMMUNICATION CONCERNING: Antibiotic IV to Oral Route Change Policy  RECOMMENDATION: This patient is receiving azithromycin by the intravenous route.  Based on criteria approved by the Pharmacy and Therapeutics Committee, the antibiotic(s) is/are being converted to the equivalent oral dose form(s).   DESCRIPTION: These criteria include:  Patient being treated for a respiratory tract infection, urinary tract infection, cellulitis or clostridium difficile associated diarrhea if on metronidazole  The patient is not neutropenic and does not exhibit a GI malabsorption state  The patient is eating (either orally or via tube) and/or has been taking other orally administered medications for a least 24 hours  The patient is improving clinically and has a Tmax < 100.5  If you have questions about this conversion, please contact the Pharmacy Department  []   [phone] )  [facility] []   [phone] )  [facility] []   [phone] )  [facility] []   [phone] )  [facility] [x]   [phone] )  [facility]  Herby Abraham, [place].D  01/23/2019 11:13 AM

## 2019-01-23 NOTE — Progress Notes (Signed)
  Speech Language Pathology Treatment: Dysphagia  Patient Details Name: Jackson [name] MRN: [id number] DOB: [birth year] Today's Date: 01/23/2019 Time: 1287-8676 SLP Time Calculation (min) (ACUTE ONLY): 17 min  Assessment / Plan / Recommendation Clinical Impression  SLP follow up to provide pt written strategies for reflux mitigation.  Using teach back reviewed strategies.  Encouraged pt to continue to sleep on his left side, avoid eating within several hours before bed, eat frequent small meals, raising HOB with blocks, wedge or sleeping on recliner.  RSI (reflux symptom index) completed with pt with him scoring 13/45 - low indication of LPR (laryngopharyngeal reflux).  All education completed in detail with this patient.     HPI HPI: pt is a 85 yo male adm to [facility] with respiratory deficits after waking up coughing causing vomiting.  He reports he consumed  jalapeno poppers at 6 pm, followed by nuts and a chocolate bar later in the evening and he questions this causing reflux.  Pt has h/o sinusitis, GERD, hip replacement, asbestos exposure, prior smoker *quit 60 years ago, DM, cardiac disease.  He has had 2 other admits with similar situations (Feb 2020, July 2019) when he had left lobe pna.  Pt CXR showed RLL Pna during this admit.  Pt states has has taken prescription reflux medicine since 2019 and prior this this OTC Prilosec x5 years.  He states he only has symptoms when he eats spicy foods.  His wife uses Nurse, adult at home in the bedroom which pt states has decreased his sinusitis.        SLP Plan  All goals met       Recommendations  Diet recommendations: Regular;Thin liquid Liquids provided via: Cup;Straw Medication Administration: Whole meds with liquid Compensations: Slow rate;Small sips/bites(small frequent meals) Postural Changes and/or Swallow Maneuvers: Seated upright 90 degrees;Upright 30-60 min after meal                Oral Care Recommendations: Oral care  QID Follow up Recommendations: None SLP Visit Diagnosis: Dysphagia, unspecified (R13.10) Plan: All goals met       GO               Jackson Salk, MS [facility] SLP Acute Rehab Services Pager [phone] Office [phone]  Jackson [name] 01/23/2019, 3:46 PM

## 2019-01-24 LAB — CBC WITH DIFFERENTIAL/PLATELET
Abs Immature Granulocytes: 0.05 10*3/uL (ref 0.00–0.07)
Basophils Absolute: 0.1 10*3/uL (ref 0.0–0.1)
Basophils Relative: 1 %
Eosinophils Absolute: 0.4 10*3/uL (ref 0.0–0.5)
Eosinophils Relative: 4 %
HCT: 41.6 % (ref 39.0–52.0)
Hemoglobin: 13.1 g/dL (ref 13.0–17.0)
Immature Granulocytes: 1 %
Lymphocytes Relative: 12 %
Lymphs Abs: 1.1 10*3/uL (ref 0.7–4.0)
MCH: 30.5 pg (ref 26.0–34.0)
MCHC: 31.5 g/dL (ref 30.0–36.0)
MCV: 97 fL (ref 80.0–100.0)
Monocytes Absolute: 1.1 10*3/uL — ABNORMAL HIGH (ref 0.1–1.0)
Monocytes Relative: 12 %
Neutro Abs: 6.4 10*3/uL (ref 1.7–7.7)
Neutrophils Relative %: 70 %
Platelets: 228 10*3/uL (ref 150–400)
RBC: 4.29 MIL/uL (ref 4.22–5.81)
RDW: 13.1 % (ref 11.5–15.5)
WBC: 9 10*3/uL (ref 4.0–10.5)
nRBC: 0 % (ref 0.0–0.2)

## 2019-01-24 LAB — BASIC METABOLIC PANEL
Anion gap: 8 (ref 5–15)
BUN: 19 mg/dL (ref 8–23)
CO2: 30 mmol/L (ref 22–32)
Calcium: 9.8 mg/dL (ref 8.9–10.3)
Chloride: 100 mmol/L (ref 98–111)
Creatinine, Ser: 1.12 mg/dL (ref 0.61–1.24)
GFR calc Af Amer: >60 mL/min (ref >60)
GFR calc non Af Amer: 58 mL/min — ABNORMAL LOW (ref >60)
Glucose, Bld: 204 mg/dL — ABNORMAL HIGH (ref 70–99)
Potassium: 4.1 mmol/L (ref 3.5–5.1)
Sodium: 138 mmol/L (ref 135–145)

## 2019-01-24 LAB — GLUCOSE, CAPILLARY
Glucose-Capillary: 113 mg/dL — ABNORMAL HIGH (ref 70–99)
Glucose-Capillary: 197 mg/dL — ABNORMAL HIGH (ref 70–99)

## 2019-01-24 MED ORDER — LEVOFLOXACIN 750 MG PO TABS: 750.0000 mg | ORAL_TABLET | Freq: Every day | ORAL | 0 refills | Status: AC

## 2019-01-24 NOTE — Discharge Instructions (Signed)
Community-Acquired Pneumonia, Adult  Pneumonia is an infection of the lungs. It causes swelling in the airways of the lungs. Mucus and fluid may also build up inside the airways.  One type of pneumonia can happen while a person is in a hospital. A different type can happen when a person is not in a hospital (community-acquired pneumonia).   What are the causes?    This condition is caused by germs (viruses, bacteria, or fungi). Some types of germs can be passed from one person to another. This can happen when you breathe in droplets from the cough or sneeze of an infected person.  What increases the risk?  You are more likely to develop this condition if you:   Have a long-term (chronic) disease, such as:  ? Chronic obstructive pulmonary disease (COPD).  ? Asthma.  ? Cystic fibrosis.  ? Congestive heart failure.  ? Diabetes.  ? Kidney disease.   Have HIV.   Have sickle cell disease.   Have had your spleen removed.   Do not take good care of your teeth and mouth (poor dental hygiene).   Have a medical condition that increases the risk of breathing in droplets from your own mouth and nose.   Have a weakened body defense system (immune system).   Are a smoker.   Travel to areas where the germs that cause this illness are common.   Are around certain animals or the places they live.  What are the signs or symptoms?   A dry cough.   A wet (productive) cough.   Fever.   Sweating.   Chest pain. This often happens when breathing deeply or coughing.   Fast breathing or trouble breathing.   Shortness of breath.   Shaking chills.   Feeling tired (fatigue).   Muscle aches.  How is this treated?  Treatment for this condition depends on many things. Most adults can be treated at home. In some cases, treatment must happen in a hospital. Treatment may include:   Medicines given by mouth or through an IV tube.   Being given extra oxygen.   Respiratory therapy.  In rare cases, treatment for very bad pneumonia  may include:   Using a machine to help you breathe.   Having a procedure to remove fluid from around your lungs.  Follow these instructions at home:  Medicines   Take over-the-counter and prescription medicines only as told by your doctor.  ? Only take cough medicine if you are losing sleep.   If you were prescribed an antibiotic medicine, take it as told by your doctor. Do not stop taking the antibiotic even if you start to feel better.  General instructions     Sleep with your head and neck raised (elevated). You can do this by sleeping in a recliner or by putting a few pillows under your head.   Rest as needed. Get at least 8 hours of sleep each night.   Drink enough water to keep your pee (urine) pale yellow.   Eat a healthy diet that includes plenty of vegetables, fruits, whole grains, low-fat dairy products, and lean protein.   Do not use any products that contain nicotine or tobacco. These include cigarettes, e-cigarettes, and chewing tobacco. If you need help quitting, ask your doctor.   Keep all follow-up visits as told by your doctor. This is important.  How is this prevented?  A shot (vaccine) can help prevent pneumonia. Shots are often suggested for:   People   older than 83 years of age.   People older than 83 years of age who:  ? Are having cancer treatment.  ? Have long-term (chronic) lung disease.  ? Have problems with their body's defense system.  You may also prevent pneumonia if you take these actions:   Get the flu (influenza) shot every year.   Go to the dentist as often as told.   Wash your hands often. If you cannot use soap and water, use hand sanitizer.  Contact a doctor if:   You have a fever.   You lose sleep because your cough medicine does not help.  Get help right away if:   You are short of breath and it gets worse.   You have more chest pain.   Your sickness gets worse. This is very serious if:  ? You are an older adult.  ? Your body's defense system is weak.   You  cough up blood.  Summary   Pneumonia is an infection of the lungs.   Most adults can be treated at home. Some will need treatment in a hospital.   Drink enough water to keep your pee pale yellow.   Get at least 8 hours of sleep each night.  This information is not intended to replace advice given to you by your health care provider. Make sure you discuss any questions you have with your health care provider.  Document Released: 02/24/2008 Document Revised: 05/05/2018 Document Reviewed: 05/05/2018  [employer]vier Interactive Patient Education  2019 [employer]

## 2019-01-24 NOTE — Progress Notes (Signed)
PT demonstrated hands on understanding of Flutter device- NP cough at this time. 

## 2019-01-24 NOTE — Discharge Summary (Signed)
Physician Discharge Summary  Jackson COURINGTON XLK:[id number] DOB: [birth year] DOA: 01/22/2019  PCP: Marden Noble, MD  Admit date: 01/22/2019 Discharge date: 01/24/2019  Admitted From: Home Disposition: Home  Recommendations for Outpatient Follow-up:  1. Follow up with PCP in 1-2 weeks 2. Please obtain BMP/CBC in one week 3. Please follow up on the following pending results:  Home Health: None Equipment/Devices: None  Discharge Condition: Stable CODE STATUS: Full code Diet recommendation: Regular with thin liquids  Subjective: Patient seen and examined.  He has no complaints.  Denies any cough or shortness of breath or chest pain or other complaint.  Brief/Interim Summary: Jackson Langlinais [name] a 85 y.o.malewith medical history significant ofwith past medical history of diabetes mellitus type 2, essential hypertension, hyperlipidemia, GERD, chronic sinusitis came to the ER on 01/22/2019 for evaluation of one episode of vomiting followed by some cough and shortness of breath.  Reported subjective chills but no fever. Lives at home with his wife and denies any contact with person with known COVID. He was admitted back here in March with similar issues and was diagnosed with pneumonia and bronchitis. No other complaints.  - In the ER patient was noted to be febrile, slightly tachycardic with relative leukocytosis. X-ray was concerning for pneumonia in the right upper lobe.   He was admitted to hospital service due to acute hypoxic respiratory failure sepsis secondary to pneumonia and was started on broad-spectrum IV antibiotics of Zosyn and azithromycin.  Patient was admitted in the stepdown unit.  He was requiring 2 to 3 L of oxygen but he has been off of oxygen since this morning.  He was saturating 94% on room air at rest and about 90% with activity when he walked with the nursing staff.  He was seemed to be doing well and not requiring any further assistant and thus no indication of any PT  evaluation.  Since patient is doing stable and not requiring oxygen so he will be discharged today.  Per chart review, it sounds like he was prescribed Augmentin for his pneumonia when he was admitted to the hospital 2 months ago so at this point in time I will prescribe him Levaquin 750 mg p.o. daily for 5 days.  He will resume all his home medications.   Discharge Diagnoses:  Principal Problem:   Sepsis due to pneumonia [facility]) Active Problems:   Acute on chronic respiratory failure with hypoxia (HCC)   Type 2 diabetes mellitus with hyperglycemia (HCC)   Essential hypertension   Dyslipidemia   Chronic diastolic CHF (congestive heart failure) (HCC)  Discharge Instructions   Allergies as of 01/24/2019      Reactions   Actos [pioglitazone] Swelling   Formaldehyde Other (See Comments)   Flu-like symptoms   Lipitor [atorvastatin Calcium]    Metformin And Related       Medication List    TAKE these medications   Accu-Chek Aviva Plus test strip Generic drug:  glucose blood   albuterol 108 (90 Base) MCG/ACT inhaler Commonly known as:  VENTOLIN HFA Inhale 2 puffs into the lungs every 6 (six) hours as needed for wheezing or shortness of breath.   aspirin EC 81 MG tablet Take 81 mg by mouth daily.   CALCIUM 600+D PO Take 1 tablet by mouth daily.   fluticasone 50 MCG/ACT nasal spray Commonly known as:  FLONASE Place 2 sprays into both nostrils 2 (two) times daily.   gabapentin 100 MG capsule Commonly known as:  NEURONTIN Take 300 mg by  mouth at bedtime.   insulin aspart 100 UNIT/ML FlexPen Commonly known as:  NovoLOG FlexPen Before each meal 3 times a day, 140-199 - 2 units, 200-250 - 4 units, 251-299 - 6 units,  300-349 - 8 units,  350 or above 10 units.   Insulin Glargine 100 UNIT/ML Solostar Pen Commonly known as:  LANTUS Inject 15 Units into the skin daily at 10 pm.   levofloxacin 750 MG tablet Commonly known as:  Levaquin Take 1 tablet (750 mg total) by mouth daily  for 5 days.   lisinopril 5 MG tablet Commonly known as:  ZESTRIL Take 1 tablet (5 mg total) by mouth daily.   MULTIVITAMIN PO Take 1 tablet by mouth daily. Centrum Silver   omeprazole 20 MG capsule Commonly known as:  PRILOSEC Take 20 mg by mouth daily.   rosuvastatin 10 MG tablet Commonly known as:  CRESTOR Take 1 tablet (10 mg total) by mouth at bedtime.   senna-docusate 8.6-50 MG tablet Commonly known as:  Senokot-S Take 2 tablets by mouth at bedtime. What changed:  how much to take   sodium chloride 0.65 % Soln nasal spray Commonly known as:  OCEAN Place 1 spray into both nostrils as needed for congestion.   tetrahydrozoline-zinc 0.05-0.25 % ophthalmic solution Commonly known as:  VISINE-AC Place 2 drops into both eyes 3 (three) times daily as needed (dry eyes).      Follow-up Information    Marden Noble, MD Follow up in 1 month(s).   Specialty:  Internal Medicine Contact information: [address] [place] [address] [phone]        Duke Salvia, MD .   Specialty:  Cardiology Contact information: [id number] [place] [address] [phone]          Allergies  Allergen Reactions  . Actos [Pioglitazone] Swelling  . Formaldehyde Other (See Comments)    Flu-like symptoms  . Lipitor [Atorvastatin Calcium]   . Metformin And Related     Consultations: None   Procedures/Studies: Dg Chest Port 1 View  Result Date: 01/22/2019 CLINICAL DATA:  Hypoxia, dyspnea EXAM: PORTABLE CHEST 1 VIEW COMPARISON:  11/18/2018 chest radiograph. FINDINGS: Stable cardiomediastinal silhouette with normal heart size. No pneumothorax. No pleural effusion. New patchy right upper lung opacity. Stable elevation of the anterior right hemidiaphragm. IMPRESSION: New patchy right upper lung opacity compatible with pneumonia. Chest radiograph follow-up advised. Electronically Signed   By: Delbert Phenix M.D.   On: 01/22/2019 10:37      Discharge Exam: Vitals:   01/24/19 1100 01/24/19 1140  BP: (!) 163/69   Pulse: 66 66  Resp: 16 13  Temp:    SpO2: 97% 96%   Vitals:   01/24/19 0724 01/24/19 0800 01/24/19 1100 01/24/19 1140  BP:   (!) 163/69   Pulse:   66 66  Resp:   16 13  Temp:  97.7 F (36.5 C)    TempSrc:  Oral    SpO2: 100%  97% 96%  Weight:      Height:        General: Pt is alert, awake, not in acute distress Cardiovascular: RRR, S1/S2 +, no rubs, no gallops Respiratory: CTA bilaterally, no wheezing, no rhonchi Abdominal: Soft, NT, ND, bowel sounds + Extremities: no edema, no cyanosis    The results of significant diagnostics from this hospitalization (including imaging, microbiology, ancillary and laboratory) are listed below for reference.     Microbiology: Recent Results (from the  past 240 hour(s))  Blood culture (routine x 2)     Status: None (Preliminary result)   Collection Time: 01/22/19  9:40 AM  Result Value Ref Range Status   Specimen Description   Final    BLOOD RIGHT ANTECUBITAL Performed at [facility], [address] [place], [place] [address]    Special Requests   Final    BOTTLES DRAWN AEROBIC AND ANAEROBIC Blood Culture adequate volume Performed at [facility], [address], [place], [place] [address]    Culture   Final    NO GROWTH 2 DAYS Performed at [facility], [address], [place], [place] [address]    Report Status PENDING  Incomplete  Blood culture (routine x 2)     Status: None (Preliminary result)   Collection Time: 01/22/19  9:40 AM  Result Value Ref Range Status   Specimen Description   Final    BLOOD BLOOD LEFT FOREARM Performed at [facility], [address] [place], [place] [address]    Special Requests   Final    BOTTLES DRAWN AEROBIC AND ANAEROBIC Blood Culture adequate volume Performed at [facility], [address] [place], [place] [address]     Culture   Final    NO GROWTH 2 DAYS Performed at [facility], [address] [place], [place] [address]    Report Status PENDING  Incomplete  Urine culture     Status: None   Collection Time: 01/22/19 10:04 AM  Result Value Ref Range Status   Specimen Description   Final    URINE, CLEAN CATCH Performed at [facility], [address] [place], [place] [address]    Special Requests   Final    Normal Performed at [facility], [address] [place], [place] [address]    Culture   Final    NO GROWTH Performed at [facility], [address], [place], [place] [address]    Report Status 01/23/2019 FINAL  Final  Expectorated sputum assessment w rflx to resp cult     Status: None   Collection Time: 01/22/19 10:04 AM  Result Value Ref Range Status   Specimen Description SPUTUM  Final   Special Requests Normal  Final   Sputum evaluation   Final    THIS SPECIMEN IS ACCEPTABLE FOR SPUTUM CULTURE Performed at [facility], [address] [place], [place] [address]    Report Status 01/22/2019 FINAL  Final  SARS Coronavirus 2 (CEPHEID - Performed in [facility] hospital lab), Hosp Order     Status: None   Collection Time: 01/22/19 10:04 AM  Result Value Ref Range Status   SARS Coronavirus 2 NEGATIVE NEGATIVE Final    Comment: (NOTE) If result is NEGATIVE SARS-CoV-2 target nucleic acids are NOT DETECTED. The SARS-CoV-2 RNA is generally detectable in upper and lower  respiratory specimens during the acute phase of infection. The lowest  concentration of SARS-CoV-2 viral copies this assay can detect is 250  copies / mL. A negative result does not preclude SARS-CoV-2 infection  and should not be used as the sole basis for treatment or other  patient management decisions.  A negative result may occur with  improper specimen collection / handling, submission of specimen other  than nasopharyngeal swab, presence of  viral mutation(s) within the  areas targeted by this assay, and inadequate number of viral copies  (<250 copies /  mL). A negative result must be combined with clinical  observations, patient history, and epidemiological information. If result is POSITIVE SARS-CoV-2 target nucleic acids are DETECTED. The SARS-CoV-2 RNA is generally detectable in upper and lower  respiratory specimens dur ing the acute phase of infection.  Positive  results are indicative of active infection with SARS-CoV-2.  Clinical  correlation with patient history and other diagnostic information is  necessary to determine patient infection status.  Positive results do  not rule out bacterial infection or co-infection with other viruses. If result is PRESUMPTIVE POSTIVE SARS-CoV-2 nucleic acids MAY BE PRESENT.   A presumptive positive result was obtained on the submitted specimen  and confirmed on repeat testing.  While 2019 novel coronavirus  (SARS-CoV-2) nucleic acids may be present in the submitted sample  additional confirmatory testing may be necessary for epidemiological  and / or clinical management purposes  to differentiate between  SARS-CoV-2 and other Sarbecovirus currently known to infect humans.  If clinically indicated additional testing with an alternate test  methodology [phone] is advised. The SARS-CoV-2 RNA is generally  detectable in upper and lower respiratory sp ecimens during the acute  phase of infection. The expected result is Negative. Fact Sheet for Patients:  BoilerBrush.com.cyhttps://www.fda.gov/media/136312/download Fact Sheet for Healthcare Providers: https://pope.com/https://www.fda.gov/media/136313/download This test is not yet approved or cleared by the [place] FDA and has been authorized for detection and/or diagnosis of SARS-CoV-2 by FDA under an Emergency Use Authorization (EUA).  This EUA will remain in effect (meaning this test can be used) for the duration of the COVID-19 declaration under Section  564(b)(1) of the Act, 21 U.S.C. section 360bbb-3(b)(1), unless the authorization is terminated or revoked sooner. Performed at [facility], [address] [place], [place] [address]   Culture, respiratory     Status: None (Preliminary result)   Collection Time: 01/22/19 10:04 AM  Result Value Ref Range Status   Specimen Description   Final    SPUTUM Performed at [facility], [address] [place], [place] [address]    Special Requests   Final    Normal Reflexed from [id number] Performed at [facility], [address] [place], [place] [address]    Gram Stain   Final    RARE WBC PRESENT, PREDOMINANTLY PMN FEW GRAM POSITIVE COCCI IN PAIRS FEW GRAM NEGATIVE RODS    Culture   Final    MODERATE Consistent with normal respiratory flora. Performed at [facility], [address] [place], [place] [address]    Report Status PENDING  Incomplete  MRSA PCR Screening     Status: None   Collection Time: 01/22/19  1:24 PM  Result Value Ref Range Status   MRSA by PCR NEGATIVE NEGATIVE Final    Comment:        The GeneXpert MRSA Assay (FDA approved for NASAL specimens only), is one component of a comprehensive MRSA colonization surveillance program. It is not intended to diagnose MRSA infection nor to guide or monitor treatment for MRSA infections. Performed at [facility], [address] [place], [place] [address]      Labs: BNP (last 3 results) Recent Labs    03/27/18 0730 03/27/18 1645 11/18/18 0907  BNP 223.8* 768.1* 205.0*   Basic Metabolic Panel: Recent Labs  Lab 01/22/19 0940 01/23/19 0224 01/24/19 1033  NA 139 138 138  K 3.9 4.2 4.1  CL 103 103 100  CO2 27 28 30   GLUCOSE 236* 144*  204*  BUN CREATININE 1.03 1.25* 1.12  CALCIUM 9.6 8.9 9.8   Liver Function Tests: Recent Labs  Lab 01/22/19 0940 01/23/19 0224  AST 17 14*  ALT 18 12  ALKPHOS 64 50  BILITOT  1.0 0.7  PROT 6.9 5.7*  ALBUMIN 4.0 3.2*   No results for input(s): LIPASE, AMYLASE in the last 168 hours. No results for input(s): AMMONIA in the last 168 hours. CBC: Recent Labs  Lab 01/22/19 0940 01/23/19 0224 01/24/19 1033  WBC 13.0* 11.5* 9.0  NEUTROABS 11.1*  --  6.4  HGB 13.9 11.5* 13.1  HCT 42.0 37.1* 41.6  MCV 93.8 97.6 97.0  PLT 230 226 228   Cardiac Enzymes: Recent Labs  Lab 01/22/19 0940  TROPONINI 0.03*   BNP: Invalid input(s): POCBNP CBG: Recent Labs  Lab 01/23/19 0758 01/23/19 1149 01/23/19 1612 01/23/19 2210 01/24/19 0750  GLUCAP 156* 254* 234* 125* 113*   D-Dimer No results for input(s): DDIMER in the last 72 hours. Hgb A1c No results for input(s): HGBA1C in the last 72 hours. Lipid Profile No results for input(s): CHOL, HDL, LDLCALC, TRIG, CHOLHDL, LDLDIRECT in the last 72 hours. Thyroid function studies No results for input(s): TSH, T4TOTAL, T3FREE, THYROIDAB in the last 72 hours.  Invalid input(s): FREET3 Anemia work up No results for input(s): VITAMINB12, FOLATE, FERRITIN, TIBC, IRON, RETICCTPCT in the last 72 hours. Urinalysis    Component Value Date/Time   COLORURINE YELLOW 01/22/2019 0940   APPEARANCEUR CLEAR 01/22/2019 0940   LABSPEC 1.012 01/22/2019 0940   PHURINE 6.0 01/22/2019 0940   GLUCOSEU 50 (A) 01/22/2019 0940   HGBUR NEGATIVE 01/22/2019 0940   BILIRUBINUR NEGATIVE 01/22/2019 0940   KETONESUR NEGATIVE 01/22/2019 0940   PROTEINUR NEGATIVE 01/22/2019 0940   UROBILINOGEN 0.2 02/27/2015 1536   NITRITE NEGATIVE 01/22/2019 0940   LEUKOCYTESUR NEGATIVE 01/22/2019 0940   Sepsis Labs Invalid input(s): PROCALCITONIN,  WBC,  LACTICIDVEN Microbiology Recent Results (from the past 240 hour(s))  Blood culture (routine x 2)     Status: None (Preliminary result)   Collection Time: 01/22/19  9:40 AM  Result Value Ref Range Status   Specimen Description   Final    BLOOD RIGHT ANTECUBITAL Performed at [facility], [address], [place], [place] [address]    Special Requests   Final    BOTTLES DRAWN AEROBIC AND ANAEROBIC Blood Culture adequate volume Performed at [facility], [address], [place], [place] [address]    Culture   Final    NO GROWTH 2 DAYS Performed at [facility], [address] [place], [place] [address]    Report Status PENDING  Incomplete  Blood culture (routine x 2)     Status: None (Preliminary result)   Collection Time: 01/22/19  9:40 AM  Result Value Ref Range Status   Specimen Description   Final    BLOOD BLOOD LEFT FOREARM Performed at [facility], [address] [place], [place] [address]    Special Requests   Final    BOTTLES DRAWN AEROBIC AND ANAEROBIC Blood Culture adequate volume Performed at [facility], [address] [place], [place] [address]    Culture   Final    NO GROWTH 2 DAYS Performed at [facility], [address], [place], [place] [address]    Report Status PENDING  Incomplete  Urine culture     Status: None   Collection Time: 01/22/19  10:04 AM  Result Value Ref Range Status   Specimen Description   Final    URINE, CLEAN CATCH Performed at [facility], [address] [place], [place] [address]    Special Requests   Final    Normal Performed at [facility], [address], [place], [place] [address]    Culture   Final    NO GROWTH Performed at [facility], [address], [place], [place] [address]    Report Status 01/23/2019 FINAL  Final  Expectorated sputum assessment w rflx to resp cult     Status: None   Collection Time: 01/22/19 10:04 AM  Result Value Ref Range Status   Specimen Description SPUTUM  Final   Special Requests Normal  Final   Sputum evaluation   Final    THIS SPECIMEN IS ACCEPTABLE FOR SPUTUM CULTURE Performed at [facility], [address], [place], [place]  [address]    Report Status 01/22/2019 FINAL  Final  SARS Coronavirus 2 (CEPHEID - Performed in [facility] hospital lab), Hosp Order     Status: None   Collection Time: 01/22/19 10:04 AM  Result Value Ref Range Status   SARS Coronavirus 2 NEGATIVE NEGATIVE Final    Comment: (NOTE) If result is NEGATIVE SARS-CoV-2 target nucleic acids are NOT DETECTED. The SARS-CoV-2 RNA is generally detectable in upper and lower  respiratory specimens during the acute phase of infection. The lowest  concentration of SARS-CoV-2 viral copies this assay can detect is 250  copies / mL. A negative result does not preclude SARS-CoV-2 infection  and should not be used as the sole basis for treatment or other  patient management decisions.  A negative result may occur with  improper specimen collection / handling, submission of specimen other  than nasopharyngeal swab, presence of viral mutation(s) within the  areas targeted by this assay, and inadequate number of viral copies  (<250 copies / mL). A negative result must be combined with clinical  observations, patient history, and epidemiological information. If result is POSITIVE SARS-CoV-2 target nucleic acids are DETECTED. The SARS-CoV-2 RNA is generally detectable in upper and lower  respiratory specimens dur ing the acute phase of infection.  Positive  results are indicative of active infection with SARS-CoV-2.  Clinical  correlation with patient history and other diagnostic information is  necessary to determine patient infection status.  Positive results do  not rule out bacterial infection or co-infection with other viruses. If result is PRESUMPTIVE POSTIVE SARS-CoV-2 nucleic acids MAY BE PRESENT.   A presumptive positive result was obtained on the submitted specimen  and confirmed on repeat testing.  While 2019 novel coronavirus  (SARS-CoV-2) nucleic acids may be present in the submitted sample  additional confirmatory testing may be necessary for  epidemiological  and / or clinical management purposes  to differentiate between  SARS-CoV-2 and other Sarbecovirus currently known to infect humans.  If clinically indicated additional testing with an alternate test  methodology [phone]) is advised. The SARS-CoV-2 RNA is generally  detectable in upper and lower respiratory sp ecimens during the acute  phase of infection. The expected result is Negative. Fact Sheet for Patients:  BoilerBrush.com.cy Fact Sheet for Healthcare Providers: https://pope.com/ This test is not yet approved or cleared by the [place] FDA and has been authorized for detection and/or diagnosis of SARS-CoV-2 by FDA under an Emergency Use Authorization (EUA).  This EUA will remain in effect (meaning this test can  be used) for the duration of the COVID-19 declaration under Section 564(b)(1) of the Act, 21 U.S.C. section 360bbb-3(b)(1), unless the authorization is terminated or revoked sooner. Performed at [facility], [address] [place], [place] [address]   Culture, respiratory     Status: None (Preliminary result)   Collection Time: 01/22/19 10:04 AM  Result Value Ref Range Status   Specimen Description   Final    SPUTUM Performed at [facility], [address] [place], [place] [address]    Special Requests   Final    Normal Reflexed from [phone] Performed at [facility], [address], [place], [place] [address]    Gram Stain   Final    RARE WBC PRESENT, PREDOMINANTLY PMN FEW GRAM POSITIVE COCCI IN PAIRS FEW GRAM NEGATIVE RODS    Culture   Final    MODERATE Consistent with normal respiratory flora. Performed at [facility], [address] [place], [place] [address]    Report Status PENDING  Incomplete  MRSA PCR Screening     Status: None   Collection Time: 01/22/19  1:24 PM  Result Value Ref Range Status   MRSA by PCR  NEGATIVE NEGATIVE Final    Comment:        The GeneXpert MRSA Assay (FDA approved for NASAL specimens only), is one component of a comprehensive MRSA colonization surveillance program. It is not intended to diagnose MRSA infection nor to guide or monitor treatment for MRSA infections. Performed at [facility], [address], [place], [place] [address]      Time coordinating discharge: 35 minutes.  SIGNED:   Hughie Closs, MD  [facility] 01/24/2019, 11:57 AM Pager [phone]  If 7PM-7AM, please contact night-coverage [email or web address] Password [email or web address]

## 2019-01-24 NOTE — Progress Notes (Signed)
Inpatient Diabetes Program Recommendations  AACE/ADA: New Consensus Statement on Inpatient Glycemic Control (2015)  Target Ranges:  Prepandial:   less than 140 mg/dL      Peak postprandial:   less than 180 mg/dL (1-2 hours)      Critically ill patients:  140 - 180 mg/dL   Lab Results  Component Value Date   GLUCAP 113 (H) 01/24/2019   HGBA1C 8.2 (H) 11/18/2018    Review of Glycemic Control  Diabetes history: DM2 Outpatient Diabetes medications: Lantus 15 units QHS, Novolog 2-10 units tidwc Current orders for Inpatient glycemic control: Lantus 15 units QHS, Novolog 0-15 units tidwc and hs  HgbA1C - 8.2% - appropriate for age Post-prandials slightly elevated. May benefit from small amount of meal coverage insulin. Pt eating 75-100%.  Inpatient Diabetes Program Recommendations:     Consider adding Novolog 2 units tidwc for meal coverage insulin if pt eats > 50% meal.  Will continue to follow.  Thank you. Ailene Ards, RD, LDN, CDE Inpatient Diabetes Coordinator [phone]

## 2019-01-24 NOTE — Progress Notes (Signed)
PROGRESS NOTE  Please note that this is a late entry.  Patient was seen and examined on 01/23/2019.  Jackson [name]  WUJ:[id number] DOB: March 18, 1929 DOA: 01/22/2019 PCP: Marden Noble, MD    Brief Narrative:  Jackson [name] is a 85 y.o. male with medical history significant of with past medical history of diabetes mellitus type 2, essential hypertension, hyperlipidemia, GERD, chronic sinusitis came to the ER on 01/22/2019 for evaluation of one episode of vomiting followed by some cough and shortness of breath.  Reported subjective chills but no fever.  Lives at home with his wife and denies any contact with person with known COVID.  He was admitted back here in March with similar issues and was diagnosed with pneumonia and bronchitis.  No other complaints.  - In the ER patient was noted to be febrile, slightly tachycardic with relative leukocytosis.  X-ray was concerning for pneumonia in the right upper lobe.    He was admitted to hospital service due to sepsis secondary to pneumonia and was started on broad-spectrum IV antibiotics.  Patient has improved as far as his symptoms course and he has remained afebrile since then.  Leukocytosis improved.  Consultants:   None  Procedures:   None  Antimicrobials:   Zosyn and azithromycin started on 01/22/2019   Subjective: Patient seen and examined.  He stated that he was feeling better.  Much less shortness of breath.  No other complaint.  Objective: Vitals:   01/24/19 0500 01/24/19 0600 01/24/19 0724 01/24/19 0800  BP: (!) 189/48 (!) 176/46    Pulse: 66 65    Resp: 10 11    Temp:    97.7 F (36.5 C)  TempSrc:    Oral  SpO2: 100% 100% 100%   Weight: 85.2 kg     Height:        Intake/Output Summary (Last 24 hours) at 01/24/2019 1029 Last data filed at 01/24/2019 1016 Gross per 24 hour  Intake 1167.24 ml  Output 3175 ml  Net -2007.76 ml   Filed Weights   01/22/19 0948 01/23/19 0500 01/24/19 0500  Weight: 83.5 kg 86.6 kg 85.2 kg     Examination:  General exam: Appears calm and comfortable, very hard of hearing Respiratory system: Scattered rhonchi bilaterally Cardiovascular system: S1 & S2 heard, RRR. No JVD, murmurs, rubs, gallops or clicks. No pedal edema. Gastrointestinal system: Abdomen is nondistended, soft and nontender. No organomegaly or masses felt. Normal bowel sounds heard. Central nervous system: Alert and oriented. No focal neurological deficits. Extremities: Symmetric 5 x 5 power. Skin: No rashes, lesions or ulcers Psychiatry: Judgement and insight appear normal. Mood & affect appropriate.    Data Reviewed: I have personally reviewed following labs and imaging studies  CBC: Recent Labs  Lab 01/22/19 0940 01/23/19 0224  WBC 13.0* 11.5*  NEUTROABS 11.1*  --   HGB 13.9 11.5*  HCT 42.0 37.1*  MCV 93.8 97.6  PLT 230 226   Basic Metabolic Panel: Recent Labs  Lab 01/22/19 0940 01/23/19 0224  NA 139 138  K 3.9 4.2  CL 103 103  CO2 27 28  GLUCOSE 236* 144*  BUN 19 22  CREATININE 1.03 1.25*  CALCIUM 9.6 8.9   GFR: Estimated Creatinine Clearance: 41.5 mL/min (A) (by C-G formula based on SCr of 1.25 mg/dL (H)). Liver Function Tests: Recent Labs  Lab 01/22/19 0940 01/23/19 0224  AST 17 14*  ALT 18 12  ALKPHOS 64 50  BILITOT 1.0 0.7  PROT 6.9 5.7*  ALBUMIN 4.0 3.2*   No results for input(s): LIPASE, AMYLASE in the last 168 hours. No results for input(s): AMMONIA in the last 168 hours. Coagulation Profile: No results for input(s): INR, PROTIME in the last 168 hours. Cardiac Enzymes: Recent Labs  Lab 01/22/19 0940  TROPONINI 0.03*   BNP (last 3 results) No results for input(s): PROBNP in the last 8760 hours. HbA1C: No results for input(s): HGBA1C in the last 72 hours. CBG: Recent Labs  Lab 01/23/19 0758 01/23/19 1149 01/23/19 1612 01/23/19 2210 01/24/19 0750  GLUCAP 156* 254* 234* 125* 113*   Lipid Profile: No results for input(s): CHOL, HDL, LDLCALC, TRIG,  CHOLHDL, LDLDIRECT in the last 72 hours. Thyroid Function Tests: No results for input(s): TSH, T4TOTAL, FREET4, T3FREE, THYROIDAB in the last 72 hours. Anemia Panel: No results for input(s): VITAMINB12, FOLATE, FERRITIN, TIBC, IRON, RETICCTPCT in the last 72 hours. Sepsis Labs: Recent Labs  Lab 01/22/19 0940  LATICACIDVEN 1.2    Recent Results (from the past 240 hour(s))  Blood culture (routine x 2)     Status: None (Preliminary result)   Collection Time: 01/22/19  9:40 AM  Result Value Ref Range Status   Specimen Description   Final    BLOOD RIGHT ANTECUBITAL Performed at [facility], [address], [place], [place] [address]    Special Requests   Final    BOTTLES DRAWN AEROBIC AND ANAEROBIC Blood Culture adequate volume Performed at [facility], [address] [place] [address], [place], [place] [address]    Culture   Final    NO GROWTH 2 DAYS Performed at [facility], [address] [place], [place] [address]    Report Status PENDING  Incomplete  Blood culture (routine x 2)     Status: None (Preliminary result)   Collection Time: 01/22/19  9:40 AM  Result Value Ref Range Status   Specimen Description   Final    BLOOD BLOOD LEFT FOREARM Performed at [facility], [address] [place], [place] [address]    Special Requests   Final    BOTTLES DRAWN AEROBIC AND ANAEROBIC Blood Culture adequate volume Performed at [facility], [address] [place], [place] [address]    Culture   Final    NO GROWTH 2 DAYS Performed at [facility], [address] [place], [place] [address]    Report Status PENDING  Incomplete  Urine culture     Status: None   Collection Time: 01/22/19 10:04 AM  Result Value Ref Range Status   Specimen Description   Final    URINE, CLEAN CATCH Performed at [facility], [address], [place], [place] [address]    Special Requests   Final     Normal Performed at [facility], [address], [place], [place] [address]    Culture   Final    NO GROWTH Performed at [facility], [address], [place], [place] [address]    Report Status 01/23/2019 FINAL  Final  Expectorated sputum assessment w rflx to resp cult     Status: None   Collection Time: 01/22/19 10:04 AM  Result Value Ref Range Status   Specimen Description SPUTUM  Final   Special Requests Normal  Final   Sputum evaluation   Final    THIS SPECIMEN IS ACCEPTABLE FOR SPUTUM CULTURE Performed at [facility], [address] [place], [place] [address]  Report Status 01/22/2019 FINAL  Final  SARS Coronavirus 2 (CEPHEID - Performed in [facility] hospital lab), Hosp Order     Status: None   Collection Time: 01/22/19 10:04 AM  Result Value Ref Range Status   SARS Coronavirus 2 NEGATIVE NEGATIVE Final    Comment: (NOTE) If result is NEGATIVE SARS-CoV-2 target nucleic acids are NOT DETECTED. The SARS-CoV-2 RNA is generally detectable in upper and lower  respiratory specimens during the acute phase of infection. The lowest  concentration of SARS-CoV-2 viral copies this assay can detect is 250  copies / mL. A negative result does not preclude SARS-CoV-2 infection  and should not be used as the sole basis for treatment or other  patient management decisions.  A negative result may occur with  improper specimen collection / handling, submission of specimen other  than nasopharyngeal swab, presence of viral mutation(s) within the  areas targeted by this assay, and inadequate number of viral copies  (<250 copies / mL). A negative result must be combined with clinical  observations, patient history, and epidemiological information. If result is POSITIVE SARS-CoV-2 target nucleic acids are DETECTED. The SARS-CoV-2 RNA is generally detectable in upper and lower  respiratory specimens dur ing the acute phase of infection.   Positive  results are indicative of active infection with SARS-CoV-2.  Clinical  correlation with patient history and other diagnostic information is  necessary to determine patient infection status.  Positive results do  not rule out bacterial infection or co-infection with other viruses. If result is PRESUMPTIVE POSTIVE SARS-CoV-2 nucleic acids MAY BE PRESENT.   A presumptive positive result was obtained on the submitted specimen  and confirmed on repeat testing.  While 2019 novel coronavirus  (SARS-CoV-2) nucleic acids may be present in the submitted sample  additional confirmatory testing may be necessary for epidemiological  and / or clinical management purposes  to differentiate between  SARS-CoV-2 and other Sarbecovirus currently known to infect humans.  If clinically indicated additional testing with an alternate test  methodology [phone] is advised. The SARS-CoV-2 RNA is generally  detectable in upper and lower respiratory sp ecimens during the acute  phase of infection. The expected result is Negative. Fact Sheet for Patients:  BoilerBrush.com.cyhttps://www.fda.gov/media/136312/download Fact Sheet for Healthcare Providers: https://pope.com/https://www.fda.gov/media/136313/download This test is not yet approved or cleared by the [place] FDA and has been authorized for detection and/or diagnosis of SARS-CoV-2 by FDA under an Emergency Use Authorization (EUA).  This EUA will remain in effect (meaning this test can be used) for the duration of the COVID-19 declaration under Section 564(b)(1) of the Act, 21 U.S.C. section 360bbb-3(b)(1), unless the authorization is terminated or revoked sooner. Performed at [facility], [address] [place], [place] [address]   Culture, respiratory     Status: None (Preliminary result)   Collection Time: 01/22/19 10:04 AM  Result Value Ref Range Status   Specimen Description   Final    SPUTUM Performed at [facility], [address] [place], [place] [address]    Special Requests   Final    Normal Reflexed from [id number] Performed at [facility], [address] [place], [place] [address]    Gram Stain   Final    RARE WBC PRESENT, PREDOMINANTLY PMN FEW GRAM POSITIVE COCCI IN PAIRS FEW GRAM NEGATIVE RODS    Culture   Final    MODERATE Consistent with normal respiratory flora. Performed at [facility], [address], [place], [place] [address]    Report Status PENDING  Incomplete  MRSA PCR Screening     Status: None   Collection Time: 01/22/19  1:24 PM  Result Value Ref Range Status   MRSA by PCR NEGATIVE NEGATIVE Final    Comment:        The GeneXpert MRSA Assay (FDA approved for NASAL specimens only), is one component of a comprehensive MRSA colonization surveillance program. It is not intended to diagnose MRSA infection nor to guide or monitor treatment for MRSA infections. Performed at [facility], [address] [place], [place] [address]       Radiology Studies: Dg Chest Port 1 View  Result Date: 01/22/2019 CLINICAL DATA:  Hypoxia, dyspnea EXAM: PORTABLE CHEST 1 VIEW COMPARISON:  11/18/2018 chest radiograph. FINDINGS: Stable cardiomediastinal silhouette with normal heart size. No pneumothorax. No pleural effusion. New patchy right upper lung opacity. Stable elevation of the anterior right hemidiaphragm. IMPRESSION: New patchy right upper lung opacity compatible with pneumonia. Chest radiograph follow-up advised. Electronically Signed   By: Delbert Phenix M.D.   On: 01/22/2019 10:37    Scheduled Meds:  aspirin EC  81 mg Oral Daily   azithromycin  500 mg Oral q1800   calcium-vitamin D  1 tablet Oral Q breakfast   Chlorhexidine Gluconate Cloth  6 each Topical Daily   enoxaparin (LOVENOX) injection  40 mg Subcutaneous Q24H   fluticasone  2 spray Each Nare BID   gabapentin  300 mg Oral QHS   insulin aspart  0-15 Units Subcutaneous TID WC    insulin aspart  0-5 Units Subcutaneous QHS   insulin glargine  15 Units Subcutaneous QHS   ipratropium  0.5 mg Nebulization TID   levalbuterol  0.63 mg Nebulization TID   lisinopril  5 mg Oral Daily   mouth rinse  15 mL Mouth Rinse BID   pantoprazole  40 mg Oral Daily   rosuvastatin  10 mg Oral QHS   Continuous Infusions:  piperacillin-tazobactam (ZOSYN)  IV 3.375 g (01/24/19 0941)     LOS: 2 days   Assessment & Plan:   Principal Problem:   Sepsis due to pneumonia [facility]) Active Problems:   Type 2 diabetes mellitus with hyperglycemia (HCC)   Essential hypertension   Dyslipidemia   Chronic diastolic CHF (congestive heart failure) (HCC)   Sepsis secondary to pneumonia, healthcare acquired, right upper lobe: Much better than yesterday.  Continue gentle IV hydration as well as continue Zosyn and vancomycin.  Covid test negative.  There was some concern of possible aspiration by admitting physician.  Seen by speech therapy but does not seem to have any problem with swallowing.  Mild acute on chronic bronchitis -Suspect from pneumonia.  Continue bronchodilator treatment.    Chronic diastolic congestive heart failure, ejection fraction 60%, grade 1 diastolic dysfunction -Currently appears to be euvolemic.  Will closely monitor him.  Diabetes mellitus type 2 Peripheral neuropathy -Insulin with Accu-Chek and sliding scale.  Lantus 15 units daily -Continue gabapentin.  Blood sugar stable.  Essential hypertension -Continue lisinopril 5 mg daily, blood pressure stable.  GERD -PPI daily 30 minutes before meals  Hyperlipidemia -Crestor  DVT prophylaxis: Lovenox Code Status: Full code Family Communication: Discussed with patient Disposition Plan: Possible discharge tomorrow   Time spent: 35 minutes   Hughie Closs, MD [facility] Pager [phone]  If 7PM-7AM, please contact night-coverage [email or web address] Password [email or web address] 01/24/2019, 10:29 AM

## 2019-01-25 LAB — CULTURE, RESPIRATORY W GRAM STAIN
Culture: NORMAL
Special Requests: NORMAL

## 2019-01-27 LAB — CULTURE, BLOOD (ROUTINE X 2)
Culture: NO GROWTH
Culture: NO GROWTH
Special Requests: ADEQUATE
Special Requests: ADEQUATE

## 2019-02-02 DIAGNOSIS — J189 Pneumonia, unspecified organism: Secondary | ICD-10-CM | POA: Diagnosis not present

## 2019-02-02 DIAGNOSIS — K219 Gastro-esophageal reflux disease without esophagitis: Secondary | ICD-10-CM | POA: Diagnosis not present

## 2019-02-02 DIAGNOSIS — Z794 Long term (current) use of insulin: Secondary | ICD-10-CM | POA: Diagnosis not present

## 2019-02-02 DIAGNOSIS — E113299 Type 2 diabetes mellitus with mild nonproliferative diabetic retinopathy without macular edema, unspecified eye: Secondary | ICD-10-CM | POA: Diagnosis not present

## 2019-03-06 DIAGNOSIS — E11319 Type 2 diabetes mellitus with unspecified diabetic retinopathy without macular edema: Secondary | ICD-10-CM | POA: Diagnosis not present

## 2019-03-06 DIAGNOSIS — E1140 Type 2 diabetes mellitus with diabetic neuropathy, unspecified: Secondary | ICD-10-CM | POA: Diagnosis not present

## 2019-03-06 DIAGNOSIS — N183 Chronic kidney disease, stage 3 (moderate): Secondary | ICD-10-CM | POA: Diagnosis not present

## 2019-03-06 DIAGNOSIS — J189 Pneumonia, unspecified organism: Secondary | ICD-10-CM | POA: Diagnosis not present

## 2019-03-06 DIAGNOSIS — K219 Gastro-esophageal reflux disease without esophagitis: Secondary | ICD-10-CM | POA: Diagnosis not present

## 2019-03-09 ENCOUNTER — Telehealth (HOSPITAL_COMMUNITY): Payer: Self-pay

## 2019-03-09 NOTE — Telephone Encounter (Signed)

## 2019-03-10 ENCOUNTER — Ambulatory Visit (HOSPITAL_COMMUNITY): Payer: Medicare HMO | Attending: Cardiology

## 2019-03-10 ENCOUNTER — Encounter: Payer: Self-pay | Admitting: Physician Assistant

## 2019-03-10 ENCOUNTER — Other Ambulatory Visit: Payer: Self-pay

## 2019-03-10 DIAGNOSIS — I700 Atherosclerosis of aorta: Secondary | ICD-10-CM | POA: Diagnosis not present

## 2019-03-10 DIAGNOSIS — I350 Nonrheumatic aortic (valve) stenosis: Secondary | ICD-10-CM | POA: Diagnosis not present

## 2019-04-22 ENCOUNTER — Emergency Department (HOSPITAL_COMMUNITY): Payer: Medicare HMO

## 2019-04-22 ENCOUNTER — Encounter (HOSPITAL_COMMUNITY): Payer: Self-pay | Admitting: Internal Medicine

## 2019-04-22 ENCOUNTER — Other Ambulatory Visit: Payer: Self-pay

## 2019-04-22 ENCOUNTER — Inpatient Hospital Stay (HOSPITAL_COMMUNITY)
Admission: EM | Admit: 2019-04-22 | Discharge: 2019-04-24 | DRG: 871 | Disposition: A | Discharge status: Home / Self Care | Payer: Medicare HMO | Attending: Internal Medicine | Admitting: Internal Medicine

## 2019-04-22 DIAGNOSIS — Z66 Do not resuscitate: Secondary | ICD-10-CM | POA: Diagnosis present

## 2019-04-22 DIAGNOSIS — J329 Chronic sinusitis, unspecified: Secondary | ICD-10-CM | POA: Diagnosis present

## 2019-04-22 DIAGNOSIS — I5032 Chronic diastolic (congestive) heart failure: Secondary | ICD-10-CM | POA: Diagnosis not present

## 2019-04-22 DIAGNOSIS — Z7982 Long term (current) use of aspirin: Secondary | ICD-10-CM | POA: Diagnosis not present

## 2019-04-22 DIAGNOSIS — R112 Nausea with vomiting, unspecified: Secondary | ICD-10-CM

## 2019-04-22 DIAGNOSIS — Z794 Long term (current) use of insulin: Secondary | ICD-10-CM | POA: Diagnosis not present

## 2019-04-22 DIAGNOSIS — I700 Atherosclerosis of aorta: Secondary | ICD-10-CM | POA: Diagnosis present

## 2019-04-22 DIAGNOSIS — Z7951 Long term (current) use of inhaled steroids: Secondary | ICD-10-CM

## 2019-04-22 DIAGNOSIS — R0902 Hypoxemia: Secondary | ICD-10-CM | POA: Diagnosis not present

## 2019-04-22 DIAGNOSIS — Z888 Allergy status to other drugs, medicaments and biological substances status: Secondary | ICD-10-CM | POA: Diagnosis not present

## 2019-04-22 DIAGNOSIS — Z833 Family history of diabetes mellitus: Secondary | ICD-10-CM

## 2019-04-22 DIAGNOSIS — E0590 Thyrotoxicosis, unspecified without thyrotoxic crisis or storm: Secondary | ICD-10-CM | POA: Diagnosis present

## 2019-04-22 DIAGNOSIS — K219 Gastro-esophageal reflux disease without esophagitis: Secondary | ICD-10-CM | POA: Diagnosis present

## 2019-04-22 DIAGNOSIS — I110 Hypertensive heart disease with heart failure: Secondary | ICD-10-CM | POA: Diagnosis present

## 2019-04-22 DIAGNOSIS — R52 Pain, unspecified: Secondary | ICD-10-CM

## 2019-04-22 DIAGNOSIS — I350 Nonrheumatic aortic (valve) stenosis: Secondary | ICD-10-CM | POA: Diagnosis present

## 2019-04-22 DIAGNOSIS — E785 Hyperlipidemia, unspecified: Secondary | ICD-10-CM | POA: Diagnosis present

## 2019-04-22 DIAGNOSIS — Z96642 Presence of left artificial hip joint: Secondary | ICD-10-CM | POA: Diagnosis present

## 2019-04-22 DIAGNOSIS — Z20828 Contact with and (suspected) exposure to other viral communicable diseases: Secondary | ICD-10-CM | POA: Diagnosis present

## 2019-04-22 DIAGNOSIS — J690 Pneumonitis due to inhalation of food and vomit: Secondary | ICD-10-CM | POA: Diagnosis not present

## 2019-04-22 DIAGNOSIS — R509 Fever, unspecified: Secondary | ICD-10-CM

## 2019-04-22 DIAGNOSIS — J189 Pneumonia, unspecified organism: Secondary | ICD-10-CM

## 2019-04-22 DIAGNOSIS — I10 Essential (primary) hypertension: Secondary | ICD-10-CM | POA: Diagnosis present

## 2019-04-22 DIAGNOSIS — R1310 Dysphagia, unspecified: Secondary | ICD-10-CM | POA: Diagnosis present

## 2019-04-22 DIAGNOSIS — A419 Sepsis, unspecified organism: Secondary | ICD-10-CM | POA: Diagnosis not present

## 2019-04-22 DIAGNOSIS — E1165 Type 2 diabetes mellitus with hyperglycemia: Secondary | ICD-10-CM | POA: Diagnosis present

## 2019-04-22 DIAGNOSIS — E1140 Type 2 diabetes mellitus with diabetic neuropathy, unspecified: Secondary | ICD-10-CM | POA: Diagnosis not present

## 2019-04-22 DIAGNOSIS — J90 Pleural effusion, not elsewhere classified: Secondary | ICD-10-CM | POA: Diagnosis not present

## 2019-04-22 DIAGNOSIS — Z87891 Personal history of nicotine dependence: Secondary | ICD-10-CM

## 2019-04-22 DIAGNOSIS — I959 Hypotension, unspecified: Secondary | ICD-10-CM | POA: Diagnosis not present

## 2019-04-22 LAB — COMPREHENSIVE METABOLIC PANEL
ALT: 21 U/L (ref 0–44)
AST: 21 U/L (ref 15–41)
Albumin: 3.6 g/dL (ref 3.5–5.0)
Alkaline Phosphatase: 68 U/L (ref 38–126)
Anion gap: 9 (ref 5–15)
BUN: 22 mg/dL (ref 8–23)
CO2: 28 mmol/L (ref 22–32)
Calcium: 9.5 mg/dL (ref 8.9–10.3)
Chloride: 100 mmol/L (ref 98–111)
Creatinine, Ser: 1.38 mg/dL — ABNORMAL HIGH (ref 0.61–1.24)
GFR calc Af Amer: 53 mL/min — ABNORMAL LOW (ref >60)
GFR calc non Af Amer: 45 mL/min — ABNORMAL LOW (ref >60)
Glucose, Bld: 247 mg/dL — ABNORMAL HIGH (ref 70–99)
Potassium: 4 mmol/L (ref 3.5–5.1)
Sodium: 137 mmol/L (ref 135–145)
Total Bilirubin: 0.8 mg/dL (ref 0.3–1.2)
Total Protein: 6.4 g/dL — ABNORMAL LOW (ref 6.5–8.1)

## 2019-04-22 LAB — URINALYSIS, ROUTINE W REFLEX MICROSCOPIC
Bilirubin Urine: NEGATIVE
Glucose, UA: 50 mg/dL — AB
Hgb urine dipstick: NEGATIVE
Ketones, ur: NEGATIVE mg/dL
Leukocytes,Ua: NEGATIVE
Nitrite: NEGATIVE
Protein, ur: NEGATIVE mg/dL
Specific Gravity, Urine: 1.015 (ref 1.005–1.030)
pH: 5 (ref 5.0–8.0)

## 2019-04-22 LAB — CBC WITH DIFFERENTIAL/PLATELET
Abs Immature Granulocytes: 0.06 10*3/uL (ref 0.00–0.07)
Basophils Absolute: 0 10*3/uL (ref 0.0–0.1)
Basophils Relative: 0 %
Eosinophils Absolute: 0.2 10*3/uL (ref 0.0–0.5)
Eosinophils Relative: 1 %
HCT: 42.7 % (ref 39.0–52.0)
Hemoglobin: 14.1 g/dL (ref 13.0–17.0)
Immature Granulocytes: 0 %
Lymphocytes Relative: 7 %
Lymphs Abs: 0.9 10*3/uL (ref 0.7–4.0)
MCH: 30.5 pg (ref 26.0–34.0)
MCHC: 33 g/dL (ref 30.0–36.0)
MCV: 92.2 fL (ref 80.0–100.0)
Monocytes Absolute: 1 10*3/uL (ref 0.1–1.0)
Monocytes Relative: 7 %
Neutro Abs: 11.4 10*3/uL — ABNORMAL HIGH (ref 1.7–7.7)
Neutrophils Relative %: 85 %
Platelets: 215 10*3/uL (ref 150–400)
RBC: 4.63 MIL/uL (ref 4.22–5.81)
RDW: 13.1 % (ref 11.5–15.5)
WBC: 13.5 10*3/uL — ABNORMAL HIGH (ref 4.0–10.5)
nRBC: 0 % (ref 0.0–0.2)

## 2019-04-22 LAB — PROTIME-INR
INR: 1 (ref 0.8–1.2)
Prothrombin Time: 13.4 seconds (ref 11.4–15.2)

## 2019-04-22 LAB — GLUCOSE, CAPILLARY
Glucose-Capillary: 254 mg/dL — ABNORMAL HIGH (ref 70–99)
Glucose-Capillary: 199 mg/dL — ABNORMAL HIGH (ref 70–99)

## 2019-04-22 LAB — SARS CORONAVIRUS 2 BY RT PCR (HOSPITAL ORDER, PERFORMED IN ~~LOC~~ HOSPITAL LAB): SARS Coronavirus 2: NEGATIVE

## 2019-04-22 LAB — LACTIC ACID, PLASMA
Lactic Acid, Venous: 2.6 mmol/L (ref 0.5–1.9)
Lactic Acid, Venous: 1.8 mmol/L (ref 0.5–1.9)

## 2019-04-22 LAB — TROPONIN I (HIGH SENSITIVITY)
Troponin I (High Sensitivity): 17 ng/L (ref <18)
Troponin I (High Sensitivity): 30 ng/L — ABNORMAL HIGH (ref <18)

## 2019-04-22 LAB — STREP PNEUMONIAE URINARY ANTIGEN: Strep Pneumo Urinary Antigen: NEGATIVE

## 2019-04-22 LAB — PROCALCITONIN: Procalcitonin: 0.82 ng/mL

## 2019-04-22 MED ORDER — ROSUVASTATIN CALCIUM 5 MG PO TABS
10.0000 mg | ORAL_TABLET | Freq: Every day | ORAL | Status: DC
  Administered 2019-04-22 – 2019-04-23 (×2): 10 mg via ORAL
  Filled 2019-04-22 (×2): qty 2

## 2019-04-22 MED ORDER — FLUTICASONE PROPIONATE 50 MCG/ACT NA SUSP
2.0000 | Freq: Two times a day (BID) | NASAL | Status: DC
  Administered 2019-04-23 (×2): 2 via NASAL
  Filled 2019-04-22: qty 16

## 2019-04-22 MED ORDER — GABAPENTIN 300 MG PO CAPS
300.0000 mg | ORAL_CAPSULE | Freq: Every day | ORAL | Status: DC
  Administered 2019-04-22 – 2019-04-23 (×2): 300 mg via ORAL
  Filled 2019-04-22 (×2): qty 1

## 2019-04-22 MED ORDER — SODIUM CHLORIDE 0.9 % IV SOLN
3.0000 g | Freq: Four times a day (QID) | INTRAVENOUS | Status: DC
  Administered 2019-04-22 – 2019-04-23 (×4): 3 g via INTRAVENOUS
  Filled 2019-04-22 (×6): qty 8

## 2019-04-22 MED ORDER — PREDNISONE 20 MG PO TABS
20.0000 mg | ORAL_TABLET | Freq: Every day | ORAL | Status: DC
  Administered 2019-04-23 – 2019-04-24 (×2): 20 mg via ORAL
  Filled 2019-04-22 (×2): qty 1

## 2019-04-22 MED ORDER — INSULIN GLARGINE 100 UNIT/ML SOLOSTAR PEN: 15.0000 [IU] | PEN_INJECTOR | Freq: Every day | SUBCUTANEOUS | Status: DC

## 2019-04-22 MED ORDER — INSULIN ASPART 100 UNIT/ML ~~LOC~~ SOLN
0.0000 [IU] | Freq: Three times a day (TID) | SUBCUTANEOUS | Status: DC
  Administered 2019-04-22: 8 [IU] via SUBCUTANEOUS
  Administered 2019-04-23: 12:00:00 5 [IU] via SUBCUTANEOUS
  Administered 2019-04-23 – 2019-04-24 (×2): 8 [IU] via SUBCUTANEOUS
  Administered 2019-04-24: 2 [IU] via SUBCUTANEOUS

## 2019-04-22 MED ORDER — PANTOPRAZOLE SODIUM 40 MG PO TBEC
40.0000 mg | DELAYED_RELEASE_TABLET | Freq: Every day | ORAL | Status: DC
  Administered 2019-04-23 – 2019-04-24 (×2): 40 mg via ORAL
  Filled 2019-04-22 (×2): qty 1

## 2019-04-22 MED ORDER — SODIUM CHLORIDE 0.9 % IV SOLN
2.0000 g | INTRAVENOUS | Status: DC
  Administered 2019-04-22: 2 g via INTRAVENOUS
  Filled 2019-04-22: qty 20

## 2019-04-22 MED ORDER — ACETAMINOPHEN 325 MG PO TABS
650.0000 mg | ORAL_TABLET | Freq: Four times a day (QID) | ORAL | Status: DC | PRN
  Administered 2019-04-22: 650 mg via ORAL

## 2019-04-22 MED ORDER — ALBUTEROL SULFATE (2.5 MG/3ML) 0.083% IN NEBU: 3.0000 mL | INHALATION_SOLUTION | Freq: Four times a day (QID) | RESPIRATORY_TRACT | Status: DC | PRN

## 2019-04-22 MED ORDER — INSULIN GLARGINE 100 UNIT/ML ~~LOC~~ SOLN
15.0000 [IU] | Freq: Every day | SUBCUTANEOUS | Status: DC
  Administered 2019-04-22 – 2019-04-23 (×2): 15 [IU] via SUBCUTANEOUS
  Filled 2019-04-22 (×3): qty 0.15

## 2019-04-22 MED ORDER — SODIUM CHLORIDE 0.9 % IV SOLN
500.0000 mg | INTRAVENOUS | Status: DC
  Administered 2019-04-22 – 2019-04-24 (×3): 500 mg via INTRAVENOUS
  Filled 2019-04-22 (×4): qty 500

## 2019-04-22 MED ORDER — SALINE SPRAY 0.65 % NA SOLN
1.0000 | NASAL | Status: DC | PRN
  Filled 2019-04-22: qty 44

## 2019-04-22 MED ORDER — LISINOPRIL 5 MG PO TABS
5.0000 mg | ORAL_TABLET | Freq: Every day | ORAL | Status: DC
  Administered 2019-04-23 – 2019-04-24 (×2): 5 mg via ORAL
  Filled 2019-04-22 (×2): qty 1

## 2019-04-22 MED ORDER — SODIUM CHLORIDE 0.9 % IV BOLUS
1000.0000 mL | Freq: Once | INTRAVENOUS | Status: AC
  Administered 2019-04-22: 1000 mL via INTRAVENOUS

## 2019-04-22 MED ORDER — SODIUM CHLORIDE 0.9 % IV SOLN
INTRAVENOUS | Status: DC
  Administered 2019-04-22 – 2019-04-23 (×3): via INTRAVENOUS

## 2019-04-22 MED ORDER — ENOXAPARIN SODIUM 40 MG/0.4ML ~~LOC~~ SOLN
40.0000 mg | SUBCUTANEOUS | Status: DC
  Administered 2019-04-22 – 2019-04-23 (×2): 40 mg via SUBCUTANEOUS
  Filled 2019-04-22 (×3): qty 0.4

## 2019-04-22 MED ORDER — ASPIRIN EC 81 MG PO TBEC
81.0000 mg | DELAYED_RELEASE_TABLET | Freq: Every day | ORAL | Status: DC
  Administered 2019-04-23 – 2019-04-24 (×2): 81 mg via ORAL
  Filled 2019-04-22 (×2): qty 1

## 2019-04-22 MED ORDER — INSULIN ASPART 100 UNIT/ML ~~LOC~~ SOLN: 0.0000 [IU] | Freq: Every day | SUBCUTANEOUS | Status: DC

## 2019-04-22 MED ORDER — NAPHAZOLINE-GLYCERIN 0.012-0.2 % OP SOLN
1.0000 [drp] | Freq: Four times a day (QID) | OPHTHALMIC | Status: DC | PRN
  Filled 2019-04-22: qty 15

## 2019-04-22 NOTE — H&P (Signed)
History and Physical    [place] [name] TMH:[id number] DOB: [birth year] DOA: 04/22/2019  PCP: Josetta Huddle, MD Consultants:  [facility] - cardiology Patient coming from:  Home - lives with wife; NOK: Wife  Chief Complaint:  N/V  HPI: [name] is a 85 y.o. male with medical history significant of PNA with sepsis (2016); chronic diastolic CHF; hyperthyroidism; HLD; HTN; and DM presenting with n/v.  He reports that his wife "couldn't stand it", called the ambulance.  He was shaking and "carrying on."  Denies fever, just shaking.  He coughs "up junk - every time I cough she thinks I ought to get checked out."  +n/v which he attributes to "congestion".   ED Course:  Started with n/v last night.  This AM, increased cough, SOB, fever to 103.  Family reports h/o aspiration PNA.  Sats 82-83% on RA, improved on 3-4L.  CXR with RLL infiltrate - PNA vs. Aspiration.  Code sepsis workup done, given antibiotics, 1L IVF.  Lactate 2.6.  No known COVID exposure, test pending.  Review of Systems: As per HPI; otherwise review of systems reviewed and negative.   Ambulatory Status:  Ambulates without assistance  Past Medical History:  Diagnosis Date  . Aortic stenosis    Echo 3/18: mild aortic stenosis (mean 12/peak 22 // Echo 3/19: mild AS (mean 12, peak 20) // Echo 02/2019: EF 92-11, grade 1 diastolic dysfunction, mild MAC, mild aortic stenosis (mean gradient 9), mild dilation of the ascending aorta (38 mm)   . Atherosclerosis of aorta ([place]) 12/16/2016   CXR 6/16: IMPRESSION: 1. Resolved left basilar airspace opacities. 2. Hazy nodularity in both lungs compatible with the patient's known pleural plaques. 3. Right anterior hemidiaphragmatic eventration. 4. Thoracic spondylosis. 5. Atherosclerotic calcification of the aortic arch.  . Bradycardia    Low HR (30s) noted during sleep during admx for sepsis in 2016; [place] 1, 2:1 block >> no indication for pacer; management of OSA recommended // transient CHB noted  on ECG during admit in 10/2018   . Diabetes mellitus without complication ([place])   . Diabetic neuropathy ([place])   . DJD (degenerative joint disease)    knee, hip  . Echocardiogram    Echo 6/16: mod LVH, EF 65-70, vigorous LVF, no RWMA, mild MR, mild LAE // Echo 3/18: EF 60-65, Gr 1 DD, mild AS, PASP 47 // Echo 3/19: EF 60-65, Gr 1 DD, mild AS (mean 12) // Echo 7/19: EF 55-60, Gr 1 DD, PASP 36  . GERD (gastroesophageal reflux disease)   . Heart murmur   . HLD (hyperlipidemia)   . Hypertension   . Hyperthyroidism   . Lexiscan Myoview 06/2018   Normal perfusion, EF 81; Low Risk  . Mild diastolic dysfunction 94/17/4081   Noted on Echocardiogram; Patient does not require diuretic Rx  . Phlebitis of left leg   . Pneumonia 02/2015   admx with sepsis     Past Surgical History:  Procedure Laterality Date  . CATARACT EXTRACTION Right   . CIRCUMCISION    . FRACTURE SURGERY    . TOTAL HIP ARTHROPLASTY Left 09/24/2017   Procedure: LEFT TOTAL HIP ARTHROPLASTY ANTERIOR APPROACH;  Surgeon: Mcarthur Rossetti, MD;  Location: [facility];  Service: Orthopedics;  Laterality: Left;    Social History   Socioeconomic History  . Marital status: Married    Spouse name: Not on file  . Number of children: Not on file  . Years of education: Not on file  . Highest education  level: Not on file  Occupational History  . Occupation: retired  Scientific laboratory technician  . Financial resource strain: Not on file  . Food insecurity    Worry: Not on file    Inability: Not on file  . Transportation needs    Medical: Not on file    Non-medical: Not on file  Tobacco Use  . Smoking status: Former Smoker    Quit date: 1960    Years since quitting: 60.6  . Smokeless tobacco: Never Used  Substance and Sexual Activity  . Alcohol use: No  . Drug use: No  . Sexual activity: Not on file  Lifestyle  . Physical activity    Days per week: Not on file    Minutes per session: Not on file  . Stress: Not on file  Relationships   . Social Herbalist on phone: Not on file    Gets together: Not on file    Attends religious service: Not on file    Active member of club or organization: Not on file    Attends meetings of clubs or organizations: Not on file    Relationship status: Not on file  . Intimate partner violence    Fear of current or ex partner: Not on file    Emotionally abused: Not on file    Physically abused: Not on file    Forced sexual activity: Not on file  Other Topics Concern  . Not on file  Social History Narrative  . Not on file    Allergies  Allergen Reactions  . Actos [Pioglitazone] Swelling  . Formaldehyde Other (See Comments)    Flu-like symptoms  . Lipitor [Atorvastatin Calcium]   . Metformin And Related     Family History  Problem Relation Age of Onset  . Diabetes Mother   . Diabetes Sister   . Diabetes Brother   . Heart attack Neg Hx   . Stroke Neg Hx   . Hypertension Neg Hx     Prior to Admission medications   Medication Sig Start Date End Date Taking? Authorizing Provider  ACCU-CHEK AVIVA PLUS test strip  07/30/17   [provider]  albuterol (PROVENTIL HFA;VENTOLIN HFA) 108 (90 Base) MCG/ACT inhaler Inhale 2 puffs into the lungs every 6 (six) hours as needed for wheezing or shortness of breath. 03/30/18   Roxan Hockey, MD  aspirin EC 81 MG tablet Take 81 mg by mouth daily.    [provider]  Calcium Carbonate-Vitamin D (CALCIUM 600+D PO) Take 1 tablet by mouth daily.    [provider]  fluticasone (FLONASE) 50 MCG/ACT nasal spray Place 2 sprays into both nostrils 2 (two) times daily. 11/20/18   Arrien, Jimmy Picket, MD  gabapentin (NEURONTIN) 100 MG capsule Take 300 mg by mouth at bedtime.     [provider]  insulin aspart (NOVOLOG FLEXPEN) 100 UNIT/ML FlexPen Before each meal 3 times a day, 140-199 - 2 units, 200-250 - 4 units, 251-299 - 6 units,  300-349 - 8 units,  350 or above 10 units. 02/28/15   Florencia Reasons, MD   Insulin Glargine (LANTUS) 100 UNIT/ML Solostar Pen Inject 15 Units into the skin daily at 10 pm.    [provider]  lisinopril (PRINIVIL,ZESTRIL) 5 MG tablet Take 1 tablet (5 mg total) by mouth daily. 12/26/18   Patwardhan, Reynold Bowen, MD  Multiple Vitamins-Minerals (MULTIVITAMIN PO) Take 1 tablet by mouth daily. Centrum Silver    [provider]  omeprazole (PRILOSEC) 20 MG capsule Take 20 mg by mouth daily.  10/20/16   [provider]  rosuvastatin (CRESTOR) 10 MG tablet Take 1 tablet (10 mg total) by mouth at bedtime. 12/27/18   Adrian Prows, MD  sodium chloride (OCEAN) 0.65 % SOLN nasal spray Place 1 spray into both nostrils as needed for congestion. 11/20/18   Arrien, Jimmy Picket, MD  tetrahydrozoline-zinc (VISINE-AC) 0.05-0.25 % ophthalmic solution Place 2 drops into both eyes 3 (three) times daily as needed (dry eyes).    [provider]    Physical Exam: Vitals:   04/22/19 1315 04/22/19 1330 04/22/19 1407 04/22/19 1515  BP:  (!) 137/47 (!) 142/54 (!) 125/108  Pulse: 70 75 (!) 58 83  Resp: (!) 24  18   Temp:   99.2 F (37.3 C) 98.4 F (36.9 C)  TempSrc:    Oral  SpO2: 98% 100% 99% 94%  Weight:      Height:    _0  (1.727 m)     . General:  Appears calm and comfortable and is NAD . Eyes:  PERRL, EOMI, normal lids, iris . ENT:  grossly normal hearing, lips & tongue, mmm . Neck:  no LAD, masses or thyromegaly . Cardiovascular:  RRR, no m/r/g. No LE edema.  Marland Kitchen Respiratory:   CTA bilaterally with scattered rhonchi.  Normal to mildly increased respiratory effort. . Abdomen:  soft, NT, ND, NABS . Skin:  no rash or induration seen on limited exam . Musculoskeletal:  grossly normal tone BUE/BLE, good ROM, no bony abnormality . Psychiatric:  grossly normal mood and affect, speech fluent and appropriate, AOx3 . Neurologic:  CN 2-12 grossly intact, moves all extremities in coordinated fashion    Radiological Exams on Admission: Dg Chest Port 1 View   Result Date: 04/22/2019 CLINICAL DATA:  85 year old male with suspected sepsis.  Vomiting. EXAM: PORTABLE CHEST 1 VIEW COMPARISON:  Portable chest 01/22/2019 and earlier. FINDINGS: Portable AP semi upright view at 0803 hours. Chronic elevation and eventration of the right hemidiaphragm. Stable cardiac size at the upper limits of normal. Other mediastinal contours are within normal limits. Visualized tracheal air column is within normal limits. Patchy opacity at the medial right lung base appears increased from 01/22/2019, but right upper lobe opacity at that time has resolved. No pneumothorax, pulmonary edema, pleural effusion or other confluent opacity. No acute osseous abnormality identified. IMPRESSION: 1. Suspect acute airspace opacity at the right lung base superimposed on chronic elevation/eventration of the right hemidiaphragm. Atelectasis possible but consider pneumonia or aspiration in this clinical setting. 2. No other acute cardiopulmonary abnormality. Electronically Signed   By: Genevie Ann M.D.   On: 04/22/2019 08:23    EKG: Independently reviewed.  NSR with rate 69; nonspecific ST changes with no evidence of acute ischemia   Labs on Admission: I have personally reviewed the available labs and imaging studies at the time of the admission.  Pertinent labs:   Glucose 247 BUN 22/Creatinine 1.38/GFR 45 HS troponin 17 Lactate 2.6, 1.8 WBC 13.5 INR 1.0  Assessment/Plan Principal Problem:   Sepsis due to pneumonia [facility]) Active Problems:   Type 2 diabetes mellitus with hyperglycemia (HCC)   Essential hypertension   Dyslipidemia   Chronic diastolic CHF (congestive heart failure) (HCC)    Sepsis due to PNA, concern for aspiration -SIRS criteria in this patient includes: Leukocytosis, fever, tachypnea, hypoxia  -Patient has evidence of acute organ failure with elevated lactate to 2.6 -While awaiting blood cultures, this appears to  be a preseptic condition. -Sepsis protocol initiated  -Suspected source is PNA, with concern for aspiration based on history provided as well as CXR findings -Blood and urine cultures pending -Will admit with telemetry and continue to monitor -NPO pending speech therapy evaluation -CURB-65 score is 2, with an estimated mortality of 9.2% -Pneumonia Severity Index (PSI) is Class 4, 9% mortality. -Corticosteroids have been to shown to low overall mortality rate; risk of ARDS; and need for mechanical ventilation.  This is particularly true in severe PNA (class 3+ PSI).  Will add 20 mg prednisone daily for 5 days. -Sputum cultures -Strep pneumo testing -Treat with IV Unasyn for aspiration PNA and Azithromycin to cover atypicals for CAP. -Will order lower respiratory tract procalcitonin level.  Antibiotics would not be indicated for PCT <0.1 and probably should not be used for < 0.25.  >0.5 indicates infection and >>0.5 indicates more serious disease.  As the procalcitonin level normalizes, it will be reasonable to consider de-escalation of antibiotic coverage. -Continue Albuterol HFA  DM -Continue Lantus -Cover with moderate-scale SSI  HTN -Continue Lisinopril, but his GFR is chronically decreasing and so this may need to be changed in the future  HLD -Continue Crestor   Note: This patient has been tested and is negative for the novel coronavirus COVID-19.   DVT prophylaxis:  Eliquis Code Status:  DNR - confirmed with patient Family Communication: None present  Disposition Plan:  Home once clinically improved Consults called: None  Admission status: Admit - It is my clinical opinion that admission to INPATIENT is reasonable and necessary because of the expectation that this patient will require hospital care that crosses at least 2 midnights to treat this condition based on the medical complexity of the problems presented.  Given the aforementioned information, the predictability of an adverse outcome is felt to be significant.      Karmen Bongo MD [facility]   How to contact the [facility] Attending or Consulting provider [place] or covering provider during after hours [place], for this patient?  1. Check the care team in [facility] and look for a) attending/consulting [facility] provider listed and b) the [facility] team listed 2. Log into [email or web address] and use [place]'s universal password to access. If you do not have the password, please contact the hospital operator. 3. Locate the [facility] provider you are looking for under [facility] and page to a number that you can be directly reached. 4. If you still have difficulty reaching the provider, please page the [facility] (Director on Call) for the Hospitalists listed on amion for assistance.   04/22/2019, 6:26 PM

## 2019-04-22 NOTE — ED Triage Notes (Signed)
Pt arrived via EMS after they were called out for pt vomiting. Pt has hx of GERD with vomiting and aspirates. Pt given 4mg  Zofran by EMS. Pt arrived with sats of 84% room air. Pt appears very sleepy but is arousable if you speak loudly.

## 2019-04-22 NOTE — ED Provider Notes (Signed)
**Note Jackson via Obfuscation** [facility] EMERGENCY DEPARTMENT Provider Note   CSN: [id number] Arrival date & time: 04/22/19  4196     History   Chief Complaint Chief Complaint  Patient presents with  . Emesis    HPI Jackson [name] is a 85 y.o. male.     Patient with hx dm, c/o nausea and vomiting last night, several episodes, and also increased non productive cough, sob, and fever 102.9. Patient's symptoms acute onset, moderate, episodic, persistent, felt worse this AM. Family indicates to EMS that with n/v in past has developed pneumonia/aspiration. Patient denies sore throat or runny nose. No headache. No neck pain/stiffness. No chest pain. No abd pain or distension. No dysuria or gu c/o. No rash. Patients pulse ox on room air is in low to mid 80s. No specific known covid+ exposure.   The history is provided by the patient and the EMS personnel.  Emesis Associated symptoms: cough and fever   Associated symptoms: no abdominal pain, no headaches and no sore throat     Past Medical History:  Diagnosis Date  . Aortic stenosis    Echo 3/18: mild aortic stenosis (mean 12/peak 22 // Echo 3/19: mild AS (mean 12, peak 20) // Echo 02/2019: EF 22-29, grade 1 diastolic dysfunction, mild MAC, mild aortic stenosis (mean gradient 9), mild dilation of the ascending aorta (38 mm)   . Atherosclerosis of aorta ([place]) 12/16/2016   CXR 6/16: IMPRESSION: 1. Resolved left basilar airspace opacities. 2. Hazy nodularity in both lungs compatible with the patient's known pleural plaques. 3. Right anterior hemidiaphragmatic eventration. 4. Thoracic spondylosis. 5. Atherosclerotic calcification of the aortic arch.  . Bradycardia    Low HR (30s) noted during sleep during admx for sepsis in 2016; [place] 1, 2:1 block >> no indication for pacer; management of OSA recommended // transient CHB noted on ECG during admit in 10/2018   . Diabetes mellitus without complication ([place])   . Diabetic neuropathy ([place])   . DJD  (degenerative joint disease)    knee, hip  . Echocardiogram    Echo 6/16: mod LVH, EF 65-70, vigorous LVF, no RWMA, mild MR, mild LAE // Echo 3/18: EF 60-65, Gr 1 DD, mild AS, PASP 47 // Echo 3/19: EF 60-65, Gr 1 DD, mild AS (mean 12) // Echo 7/19: EF 55-60, Gr 1 DD, PASP 36  . GERD (gastroesophageal reflux disease)   . Heart murmur   . HLD (hyperlipidemia)   . Hypertension   . Hyperthyroidism   . Lexiscan Myoview 06/2018   Normal perfusion, EF 81; Low Risk  . Mild diastolic dysfunction 79/89/2119   Noted on Echocardiogram; Patient does not require diuretic Rx  . Phlebitis of left leg   . Pneumonia 02/2015   admx with sepsis     Patient Active Problem List   Diagnosis Date Noted  . Aspiration into airway 01/22/2019  . Essential hypertension 11/18/2018  . Dyslipidemia 11/18/2018  . Chronic diastolic CHF (congestive heart failure) ([place]) 11/18/2018  . Status post total replacement of left hip 09/24/2017  . Pain in left hip 08/11/2017  . Unilateral primary osteoarthritis, left hip 08/11/2017  . Bradycardia 12/16/2016  . Atherosclerosis of aorta ([place]) 12/16/2016  . Aortic stenosis   . Severe sepsis ([place]) 02/25/2015  . Bilateral pneumonia 02/25/2015  . Lactic acidosis 02/25/2015  . Hypoxia 02/25/2015  . Sepsis due to pneumonia ([place]) 02/25/2015  . Acute on chronic respiratory failure with hypoxia ([place])   . CAP (community acquired pneumonia)   .  SOB (shortness of breath)   . Type 2 diabetes mellitus with hyperglycemia [facility])     Past Surgical History:  Procedure Laterality Date  . CATARACT EXTRACTION Right   . CIRCUMCISION    . FRACTURE SURGERY    . TOTAL HIP ARTHROPLASTY Left 09/24/2017   Procedure: LEFT TOTAL HIP ARTHROPLASTY ANTERIOR APPROACH;  Surgeon: Mcarthur Rossetti, MD;  Location: [facility] ORS;  Service: Orthopedics;  Laterality: Left;        Home Medications    Prior to Admission medications   Medication Sig Start Date End Date Taking? Authorizing Provider   ACCU-CHEK AVIVA PLUS test strip  07/30/17   [provider]  albuterol (PROVENTIL HFA;VENTOLIN HFA) 108 (90 Base) MCG/ACT inhaler Inhale 2 puffs into the lungs every 6 (six) hours as needed for wheezing or shortness of breath. 03/30/18   Roxan Hockey, MD  aspirin EC 81 MG tablet Take 81 mg by mouth daily.    [provider]  Calcium Carbonate-Vitamin D (CALCIUM 600+D PO) Take 1 tablet by mouth daily.    [provider]  fluticasone (FLONASE) 50 MCG/ACT nasal spray Place 2 sprays into both nostrils 2 (two) times daily. 11/20/18   Arrien, Jimmy Picket, MD  gabapentin (NEURONTIN) 100 MG capsule Take 300 mg by mouth at bedtime.     [provider]  insulin aspart (NOVOLOG FLEXPEN) 100 UNIT/ML FlexPen Before each meal 3 times a day, 140-199 - 2 units, 200-250 - 4 units, 251-299 - 6 units,  300-349 - 8 units,  350 or above 10 units. 02/28/15   Florencia Reasons, MD  Insulin Glargine (LANTUS) 100 UNIT/ML Solostar Pen Inject 15 Units into the skin daily at 10 pm.    [provider]  lisinopril (PRINIVIL,ZESTRIL) 5 MG tablet Take 1 tablet (5 mg total) by mouth daily. 12/26/18   Patwardhan, Reynold Bowen, MD  Multiple Vitamins-Minerals (MULTIVITAMIN PO) Take 1 tablet by mouth daily. Centrum Silver    [provider]  omeprazole (PRILOSEC) 20 MG capsule Take 20 mg by mouth daily.  10/20/16   [provider]  rosuvastatin (CRESTOR) 10 MG tablet Take 1 tablet (10 mg total) by mouth at bedtime. 12/27/18   Adrian Prows, MD  sodium chloride (OCEAN) 0.65 % SOLN nasal spray Place 1 spray into both nostrils as needed for congestion. 11/20/18   Arrien, Jimmy Picket, MD  tetrahydrozoline-zinc (VISINE-AC) 0.05-0.25 % ophthalmic solution Place 2 drops into both eyes 3 (three) times daily as needed (dry eyes).    [provider]    Family History Family History  Problem Relation Age of Onset  . Diabetes Mother   . Diabetes Sister   . Diabetes Brother   . Heart  attack Neg Hx   . Stroke Neg Hx   . Hypertension Neg Hx     Social History Social History   Tobacco Use  . Smoking status: Former Smoker    Quit date: 1960    Years since quitting: 60.6  . Smokeless tobacco: Never Used  Substance Use Topics  . Alcohol use: No  . Drug use: No     Allergies   Actos [pioglitazone], Formaldehyde, Lipitor [atorvastatin calcium], and Metformin and related   Review of Systems Review of Systems  Constitutional: Positive for fever.  HENT: Negative for sore throat.   Eyes: Negative for redness.  Respiratory: Positive for cough and shortness of breath.   Cardiovascular: Negative for chest pain.  Gastrointestinal: Positive for nausea and vomiting. Negative for abdominal pain.  Endocrine: Negative for polyuria.  Genitourinary: Negative for dysuria and flank pain.  Musculoskeletal: Negative for back pain, neck pain and neck stiffness.  Skin: Negative for rash.  Neurological: Negative for headaches.  Hematological: Does not bruise/bleed easily.  Psychiatric/Behavioral: Negative for confusion.     Physical Exam Updated Vital Signs BP (!) 159/58   Pulse 76   Temp (!) 102.9 F (39.4 C) (Oral)   Resp (!) 26   Ht 1.753 m (_0 )   Wt 72.6 kg   SpO2 94%   BMI 23.63 kg/m   Physical Exam Vitals signs and nursing note reviewed.  Constitutional:      Appearance: He is well-developed. He is ill-appearing.     Comments: Febrile.   HENT:     Head: Atraumatic.     Nose: Nose normal.     Mouth/Throat:     Mouth: Mucous membranes are moist.     Pharynx: Oropharynx is clear.  Eyes:     General: No scleral icterus.    Conjunctiva/sclera: Conjunctivae normal.     Pupils: Pupils are equal, round, and reactive to light.  Neck:     Musculoskeletal: Normal range of motion and neck supple. No neck rigidity.     Trachea: No tracheal deviation.     Comments: No stiffness or rigidity.  Cardiovascular:     Rate and Rhythm: Normal rate and regular  rhythm.     Pulses: Normal pulses.     Heart sounds: Normal heart sounds. No murmur. No friction rub. No gallop.   Pulmonary:     Effort: Pulmonary effort is normal. No accessory muscle usage or respiratory distress.     Breath sounds: Rhonchi present.  Abdominal:     General: Bowel sounds are normal. There is no distension.     Palpations: Abdomen is soft.     Tenderness: There is no abdominal tenderness. There is no guarding.  Genitourinary:    Comments: No cva tenderness. Musculoskeletal:        General: No swelling or tenderness.  Skin:    General: Skin is warm and dry.     Findings: No rash.  Neurological:     Mental Status: He is alert.     Comments: Alert, speech clear. +HOH. Motor intact bil, stre 5/5. sens grossly intact bil.   Psychiatric:        Mood and Affect: Mood normal.      ED Treatments / Results  Labs (all labs ordered are listed, but only abnormal results are displayed) Results for orders placed or performed during the hospital encounter of 04/22/19  Comprehensive metabolic panel  Result Value Ref Range   Sodium 137 135 - 145 mmol/L   Potassium 4.0 3.5 - 5.1 mmol/L   Chloride 100 98 - 111 mmol/L   CO2 28 22 - 32 mmol/L   Glucose, Bld 247 (H) 70 - 99 mg/dL   BUN 22 8 - 23 mg/dL   Creatinine, Ser 1.38 (H) 0.61 - 1.24 mg/dL   Calcium 9.5 8.9 - 10.3 mg/dL   Total Protein 6.4 (L) 6.5 - 8.1 g/dL   Albumin 3.6 3.5 - 5.0 g/dL   AST 21 15 - 41 U/L   ALT 21 0 - 44 U/L   Alkaline Phosphatase 68 38 - 126 U/L   Total Bilirubin 0.8 0.3 - 1.2 mg/dL   GFR calc non Af Amer 45 (L) >60 mL/min   GFR calc Af Amer 53 (L) >60 mL/min   Anion gap  9 5 - 15  Lactic acid, plasma  Result Value Ref Range   Lactic Acid, Venous 2.6 (HH) 0.5 - 1.9 mmol/L  CBC with Differential  Result Value Ref Range   WBC 13.5 (H) 4.0 - 10.5 K/uL   RBC 4.63 4.22 - 5.81 MIL/uL   Hemoglobin 14.1 13.0 - 17.0 g/dL   HCT 42.7 39.0 - 52.0 %   MCV 92.2 80.0 - 100.0 fL   MCH 30.5 26.0 - 34.0 pg    MCHC 33.0 30.0 - 36.0 g/dL   RDW 13.1 11.5 - 15.5 %   Platelets 215 150 - 400 K/uL   nRBC 0.0 0.0 - 0.2 %   Neutrophils Relative % 85 %   Neutro Abs 11.4 (H) 1.7 - 7.7 K/uL   Lymphocytes Relative 7 %   Lymphs Abs 0.9 0.7 - 4.0 K/uL   Monocytes Relative 7 %   Monocytes Absolute 1.0 0.1 - 1.0 K/uL   Eosinophils Relative 1 %   Eosinophils Absolute 0.2 0.0 - 0.5 K/uL   Basophils Relative 0 %   Basophils Absolute 0.0 0.0 - 0.1 K/uL   Immature Granulocytes 0 %   Abs Immature Granulocytes 0.06 0.00 - 0.07 K/uL  Protime-INR  Result Value Ref Range   Prothrombin Time 13.4 11.4 - 15.2 seconds   INR 1.0 0.8 - 1.2  Troponin I (High Sensitivity)  Result Value Ref Range   Troponin I (High Sensitivity) 17 <18 ng/L   Dg Chest Port 1 View  Result Date: 04/22/2019 CLINICAL DATA:  85 year old male with suspected sepsis.  Vomiting. EXAM: PORTABLE CHEST 1 VIEW COMPARISON:  Portable chest 01/22/2019 and earlier. FINDINGS: Portable AP semi upright view at 0803 hours. Chronic elevation and eventration of the right hemidiaphragm. Stable cardiac size at the upper limits of normal. Other mediastinal contours are within normal limits. Visualized tracheal air column is within normal limits. Patchy opacity at the medial right lung base appears increased from 01/22/2019, but right upper lobe opacity at that time has resolved. No pneumothorax, pulmonary edema, pleural effusion or other confluent opacity. No acute osseous abnormality identified. IMPRESSION: 1. Suspect acute airspace opacity at the right lung base superimposed on chronic elevation/eventration of the right hemidiaphragm. Atelectasis possible but consider pneumonia or aspiration in this clinical setting. 2. No other acute cardiopulmonary abnormality. Electronically Signed   By: Genevie Ann M.D.   On: 04/22/2019 08:23    EKG EKG Interpretation  Date/Time:  Saturday April 22 2019 06:57:05 EDT Ventricular Rate:  69 PR Interval:    QRS Duration: 90 QT  Interval:  350 QTC Calculation: 375 R Axis:   -84 Text Interpretation:  Junctional rhythm Nonspecific ST abnormality Confirmed by Lajean Saver [phone]) on 04/22/2019 7:15:55 AM   Radiology Dg Chest Port 1 View  Result Date: 04/22/2019 CLINICAL DATA:  85 year old male with suspected sepsis.  Vomiting. EXAM: PORTABLE CHEST 1 VIEW COMPARISON:  Portable chest 01/22/2019 and earlier. FINDINGS: Portable AP semi upright view at 0803 hours. Chronic elevation and eventration of the right hemidiaphragm. Stable cardiac size at the upper limits of normal. Other mediastinal contours are within normal limits. Visualized tracheal air column is within normal limits. Patchy opacity at the medial right lung base appears increased from 01/22/2019, but right upper lobe opacity at that time has resolved. No pneumothorax, pulmonary edema, pleural effusion or other confluent opacity. No acute osseous abnormality identified. IMPRESSION: 1. Suspect acute airspace opacity at the right lung base superimposed on chronic elevation/eventration of the right hemidiaphragm. Atelectasis  possible but consider pneumonia or aspiration in this clinical setting. 2. No other acute cardiopulmonary abnormality. Electronically Signed   By: Genevie Ann M.D.   On: 04/22/2019 08:23    Procedures Procedures (including critical care time)  Medications Ordered in ED Medications  cefTRIAXone (ROCEPHIN) 2 g in sodium chloride 0.9 % 100 mL IVPB (has no administration in time range)  azithromycin (ZITHROMAX) 500 mg in sodium chloride 0.9 % 250 mL IVPB (has no administration in time range)     Initial Impression / Assessment and Plan / ED Course  I have reviewed the triage vital signs and the nursing notes.  Pertinent labs & imaging results that were available during my care of the patient were reviewed by me and considered in my medical decision making (see chart for details).  Patient is febrile and hypoxic. o2 [place] - titrated - currently 4 liters,  sats 93%.   Code sepsis initiated. Blood cultures sent. covid swab sent given cough and hypoxia.  IV antibiotics ordered. Stat labs and pcxr. Ecg.   Reviewed nursing notes and prior charts for additional history.   Labs reviewed by me - wbc elev, initial lactate high - iv ns boluses.   covid test pending.  DEONTREY [name] was evaluated in Emergency Department on 04/22/2019 for the symptoms described in the history of present illness. He was evaluated in the context of the global COVID-19 pandemic, which necessitated consideration that the patient might be at risk for infection with the SARS-CoV-2 virus that causes COVID-19. Institutional protocols and algorithms that pertain to the evaluation of patients at risk for COVID-19 are in a state of rapid change based on information released by regulatory bodies including the CDC and federal and state organizations. These policies and algorithms were followed during the patient's care in the ED.  Pt currently on 5 liters o2 [place], sats 96%.   Hospitalists consulted for admission - will admit.  Delta lactate pending.   CRITICAL CARE RE: acute respiratory failure with hypoxia, RLL pna, aspiration pna, elevate lactic acid.  Performed by: Mirna Mires Total critical care time: 45 minutes Critical care time was exclusive of separately billable procedures and treating other patients. Critical care was necessary to treat or prevent imminent or life-threatening deterioration. Critical care was time spent personally by me on the following activities: development of treatment plan with patient and/or surrogate as well as nursing, discussions with consultants, evaluation of patient's response to treatment, examination of patient, obtaining history from patient or surrogate, ordering and performing treatments and interventions, ordering and review of laboratory studies, ordering and review of radiographic studies, pulse oximetry and re-evaluation of patient's  condition.   Final Clinical Impressions(s) / ED Diagnoses   Final diagnoses:  None    ED Discharge Orders    None       Lajean Saver, MD 04/22/19 [phone]

## 2019-04-22 NOTE — ED Notes (Signed)
ED TO INPATIENT HANDOFF REPORT  ED Nurse Name and Phone #:  [phone]  S Name/Age/Gender Jackson [name] 85 y.o. male Room/Bed: 028C/028C  Code Status   Code Status: DNR  Home/SNF/Other Home Patient oriented to: self, place, time and situation Is this baseline? Yes   Triage Complete: Triage complete  Chief Complaint NV  Triage Note Pt arrived via EMS after they were called out for pt vomiting. Pt has hx of GERD with vomiting and aspirates. Pt given 48m Zofran by EMS. Pt arrived with sats of 84% room air. Pt appears very sleepy but is arousable if you speak loudly.   Allergies Allergies  Allergen Reactions  . Actos [Pioglitazone] Swelling  . Formaldehyde Other (See Comments)    Flu-like symptoms  . Lipitor [Atorvastatin Calcium]   . Metformin And Related     Level of Care/Admitting Diagnosis ED Disposition    [id number]  Level of Care: Telemetry Medical [104]  Covid Evaluation: Confirmed COVID Negative  Diagnosis: Sepsis due to pneumonia ([facility]) [[id number]] Admitting Physician: YKarmen Bongo[[id number]]  Attending Physician: YKarmen Bongo[[id number]]  Estimated length of stay: past midnight tomorrow  Certification:: I certify this patient will need inpatient services for at least 2 midnights  PT Class (Do Not Modify): Inpatient [101]  PT Acc Code (Do Not Modify): Private [1]       B Medical/Surgery History Past Medical History:  Diagnosis Date  . Aortic stenosis    Echo 3/18: mild aortic stenosis (mean 12/peak 22 // Echo 3/19: mild AS (mean 12, peak 20) // Echo 02/2019: EF 678-46 grade 1 diastolic dysfunction, mild MAC, mild aortic stenosis (mean gradient 9), mild dilation of the ascending aorta (38 mm)   . [place]ros[place] of [place] ([place] 12/16/2016   CXR 6/16: IMPRESSION: 1. Resolved left basilar airspace opacities. 2. Hazy nodularity in both lungs compatible with the patient's known  pleural plaques. 3. Right anterior hemidiaphragmatic eventration. 4. Thoracic spondylosis. 5. Atherosclerotic calcification of the aortic arch.  . Bradycardia    Low HR (30s) noted during sleep during admx for sepsis in 2016; Mobitz 1, 2:1 block >> no indication for pacer; management of OSA recommended // transient CHB noted on ECG during admit in 10/2018   . Diabetes mellitus without complication ([place]   . Diabetic neuropathy ([facility]   . DJD (degenerative joint disease)    knee, hip  . Echocardiogram    Echo 6/16: mod LVH, EF 65-70, vigorous LVF, no RWMA, mild MR, mild LAE // Echo 3/18: EF 60-65, Gr 1 DD, mild AS, PASP 47 // Echo 3/19: EF 60-65, Gr 1 DD, mild AS (mean 12) // Echo 7/19: EF 55-60, Gr 1 DD, PASP 36  . GERD (gastroesophageal reflux disease)   . Heart murmur   . HLD (hyperlipidemia)   . Hypertension   . Hyperthyroidism   . Lexiscan Myoview 06/2018   Normal perfusion, EF 81; Low Risk  . Mild diastolic dysfunction 096/29/5284  Noted on Echocardiogram; Patient does not require diuretic Rx  . Phlebitis of left leg   . Pneumonia 02/2015   admx with sepsis    Past Surgical History:  Procedure Laterality Date  . CATARACT EXTRACTION Right   . CIRCUMCISION    . FRACTURE SURGERY    . TOTAL HIP ARTHROPLASTY Left 09/24/2017   Procedure: LEFT TOTAL HIP ARTHROPLASTY ANTERIOR APPROACH;  Surgeon: BMcarthur Rossetti MD;  Location: [facility];  Service: Orthopedics;  Laterality: Left;     A IV Location/Drains/Wounds Patient Lines/Drains/Airways Status   Active Line/Drains/Airways    Name:   Placement date:   Placement time:   Site:   Days:   Peripheral IV 04/22/19 Left Antecubital   04/22/19    0703    Antecubital   less than 1   Peripheral IV 04/22/19 Left Wrist   04/22/19    0704    Wrist   less than 1          Intake/Output Last 24 hours  Intake/Output Summary (Last 24 hours) at 04/22/2019 1427 Last data filed at 04/22/2019 1016 Gross per 24 hour  Intake 1350.32 ml  Output -   Net 1350.32 ml    Labs/Imaging Results for orders placed or performed during the hospital encounter of 04/22/19 (from the past 48 hour(s))  Comprehensive metabolic panel     Status: Abnormal   Collection Time: 04/22/19  7:00 AM  Result Value Ref Range   Sodium 137 135 - 145 mmol/L   Potassium 4.0 3.5 - 5.1 mmol/L   Chloride 100 98 - 111 mmol/L   CO2 28 22 - 32 mmol/L   Glucose, Bld 247 (H) 70 - 99 mg/dL   BUN 22 8 - 23 mg/dL   Creatinine, Ser 1.38 (H) 0.61 - 1.24 mg/dL   Calcium 9.5 8.9 - 10.3 mg/dL   Total Protein 6.4 (L) 6.5 - 8.1 g/dL   Albumin 3.6 3.5 - 5.0 g/dL   AST 21 15 - 41 U/L   ALT 21 0 - 44 U/L   Alkaline Phosphatase 68 38 - 126 U/L   Total Bilirubin 0.8 0.3 - 1.2 mg/dL   GFR calc non Af Amer 45 (L) >60 mL/min   GFR calc Af Amer 53 (L) >60 mL/min   Anion gap 9 5 - 15    Comment: Performed at [facility], [address], [place], [place] [address]  Lactic acid, plasma     Status: Abnormal   Collection Time: 04/22/19  7:00 AM  Result Value Ref Range   Lactic Acid, Venous 2.6 (HH) 0.5 - 1.9 mmol/L    Comment: CRITICAL RESULT CALLED TO, READ BACK BY AND VERIFIED WITH: N.Marchetta Navratil,RN @ 9233 04/22/2019 [place] Performed at [facility], [place] [address], [place], [place] [address]   CBC with Differential     Status: Abnormal   Collection Time: 04/22/19  7:00 AM  Result Value Ref Range   WBC 13.5 (H) 4.0 - 10.5 K/uL   RBC 4.63 4.22 - 5.81 MIL/uL   Hemoglobin 14.1 13.0 - 17.0 g/dL   HCT 42.7 39.0 - 52.0 %   MCV 92.2 80.0 - 100.0 fL   MCH 30.5 26.0 - 34.0 pg   MCHC 33.0 30.0 - 36.0 g/dL   RDW 13.1 11.5 - 15.5 %   Platelets 215 150 - 400 K/uL   nRBC 0.0 0.0 - 0.2 %   Neutrophils Relative % 85 %   Neutro Abs 11.4 (H) 1.7 - 7.7 K/uL   Lymphocytes Relative 7 %   Lymphs Abs 0.9 0.7 - 4.0 K/uL   Monocytes Relative 7 %   Monocytes Absolute 1.0 0.1 - 1.0 K/uL   Eosinophils Relative 1 %   Eosinophils Absolute 0.2 0.0 - 0.5 K/uL   Basophils Relative 0 %    Basophils Absolute 0.0 0.0 - 0.1 K/uL   Immature Granulocytes 0 %   Abs Immature Granulocytes 0.06 0.00 -  0.07 K/uL    Comment: Performed at [facility], [place] [address], [place], [place] [address]  Protime-INR     Status: None   Collection Time: 04/22/19  7:00 AM  Result Value Ref Range   Prothrombin Time 13.4 11.4 - 15.2 seconds   INR 1.0 0.8 - 1.2    Comment: (NOTE) INR goal varies based on device and disease states. Performed at [facility], [place] [address], [place], [place] [address]   Troponin I (High Sensitivity)     Status: None   Collection Time: 04/22/19  7:00 AM  Result Value Ref Range   Troponin I (High Sensitivity) 17 <18 ng/L    Comment: (NOTE) Elevated high sensitivity troponin I (hsTnI) values and significant  changes across serial measurements may suggest ACS but many other  chronic and acute conditions are known to elevate hsTnI results.  Refer to the "Links" section for chest pain algorithms and additional  guidance. Performed at [facility], [place] [address] [place], [place] [address]   SARS Coronavirus 2 [facility] order, Performed in [facility] hospital lab) Nasopharyngeal Nasopharyngeal Swab     Status: None   Collection Time: 04/22/19  7:35 AM   Specimen: Nasopharyngeal Swab  Result Value Ref Range   SARS Coronavirus 2 NEGATIVE NEGATIVE    Comment: (NOTE) If result is NEGATIVE SARS-CoV-2 target nucleic acids are NOT DETECTED. The SARS-CoV-2 RNA is generally detectable in upper and lower  respiratory specimens during the acute phase of infection. The lowest  concentration of SARS-CoV-2 viral copies this assay can detect is 250  copies / mL. A negative result does not preclude SARS-CoV-2 infection  and should not be used as the sole basis for treatment or other  patient management decisions.  A negative result may occur with  improper specimen collection / handling, submission of specimen other  than nasopharyngeal swab, presence of  viral mutation(s) within the  areas targeted by this assay, and inadequate number of viral copies  (<250 copies / mL). A negative result must be combined with clinical  observations, patient history, and epidemiological information. If result is POSITIVE SARS-CoV-2 target nucleic acids are DETECTED. The SARS-CoV-2 RNA is generally detectable in upper and lower  respiratory specimens dur ing the acute phase of infection.  Positive  results are indicative of active infection with SARS-CoV-2.  Clinical  correlation with patient history and other diagnostic information is  necessary to determine patient infection status.  Positive results do  not rule out bacterial infection or co-infection with other viruses. If result is PRESUMPTIVE POSTIVE SARS-CoV-2 nucleic acids MAY BE PRESENT.   A presumptive positive result was obtained on the submitted specimen  and confirmed on repeat testing.  While 2019 novel coronavirus  (SARS-CoV-2) nucleic acids may be present in the submitted sample  additional confirmatory testing may be necessary for epidemiological  and / or clinical management purposes  to differentiate between  SARS-CoV-2 and other Sarbecovirus currently known to infect humans.  If clinically indicated additional testing with an alternate test  methodology [phone]) is advised. The SARS-CoV-2 RNA is generally  detectable in upper and lower respiratory sp ecimens during the acute  phase of infection. The expected result is Negative. Fact Sheet for Patients:  StrictlyIdeas.no Fact Sheet for Healthcare Providers: BankingDealers.co.za This test is not yet approved or cleared by the [place] FDA and has been authorized for detection and/or diagnosis of SARS-CoV-2 by FDA under an Emergency Use Authorization (EUA).  This EUA will remain in effect (meaning this test can be used) for the duration of the COVID-19 declaration under Section  564(b)(1) of the Act, 21 U.S.C. section 360bbb-3(b)(1), unless the authorization is terminated or revoked sooner. Performed at [facility], [place] [address], [place], [place] [address]   Lactic acid, plasma     Status: None   Collection Time: 04/22/19  9:11 AM  Result Value Ref Range   Lactic Acid, Venous 1.8 0.5 - 1.9 mmol/L    Comment: Performed at [place] [address] [place], [place] [address]  Troponin I (High Sensitivity)     Status: Abnormal   Collection Time: 04/22/19  9:11 AM  Result Value Ref Range   Troponin I (High Sensitivity) 30 (H) <18 ng/L    Comment: (NOTE) Elevated high sensitivity troponin I (hsTnI) values and significant  changes across serial measurements may suggest ACS but many other  chronic and acute conditions are known to elevate hsTnI results.  Refer to the "Links" section for chest pain algorithms and additional  guidance. Performed at [facility], [place] [address], [place], [place] [address]   Procalcitonin     Status: None   Collection Time: 04/22/19  9:11 AM  Result Value Ref Range   Procalcitonin 0.82 ng/mL    Comment:        Interpretation: PCT > 0.5 ng/mL and <= 2 ng/mL: Systemic infection (sepsis) is possible, but other conditions are known to elevate PCT as well. (NOTE)       Sepsis PCT Algorithm           Lower Respiratory Tract                                      Infection PCT Algorithm    ----------------------------     ----------------------------         PCT < 0.25 ng/mL                PCT < 0.10 ng/mL         Strongly encourage             Strongly discourage   discontinuation of antibiotics    initiation of antibiotics    ----------------------------     -----------------------------       PCT 0.25 - 0.50 ng/mL            PCT 0.10 - 0.25 ng/mL               OR       >80% decrease in PCT            Discourage initiation of                                            antibiotics      Encourage  discontinuation           of antibiotics    ----------------------------     -----------------------------         PCT >= 0.50 ng/mL              PCT 0.26 - 0.50 ng/mL                AND       <80%  decrease in PCT             Encourage initiation of                                             antibiotics       Encourage continuation           of antibiotics    ----------------------------     -----------------------------        PCT >= 0.50 ng/mL                  PCT > 0.50 ng/mL               AND         increase in PCT                  Strongly encourage                                      initiation of antibiotics    Strongly encourage escalation           of antibiotics                                     -----------------------------                                           PCT <= 0.25 ng/mL                                                 OR                                        > 80% decrease in PCT                                     Discontinue / Do not initiate                                             antibiotics Performed at [facility], [address] [place], [place] [address]   Urinalysis, Routine w reflex microscopic     Status: Abnormal   Collection Time: 04/22/19 11:49 AM  Result Value Ref Range   Color, Urine YELLOW YELLOW   APPearance CLEAR CLEAR   Specific Gravity, Urine 1.015 1.005 - 1.030   pH 5.0 5.0 - 8.0   Glucose, UA 50 (A) NEGATIVE mg/dL   Hgb urine dipstick NEGATIVE NEGATIVE   Bilirubin Urine NEGATIVE NEGATIVE   Ketones, ur NEGATIVE NEGATIVE mg/dL   Protein, ur NEGATIVE NEGATIVE mg/dL   Nitrite NEGATIVE NEGATIVE   Leukocytes,Ua NEGATIVE NEGATIVE  Comment: Performed at [facility], [place] [address] [place], [place] [address]   Dg Chest Port 1 View  Result Date: 04/22/2019 CLINICAL DATA:  85 year old male with suspected sepsis.  Vomiting. EXAM: PORTABLE CHEST 1 VIEW COMPARISON:  Portable chest 01/22/2019 and earlier. FINDINGS:  Portable AP semi upright view at 0803 hours. Chronic elevation and eventration of the right hemidiaphragm. Stable cardiac size at the upper limits of normal. Other mediastinal contours are within normal limits. Visualized tracheal air column is within normal limits. Patchy opacity at the medial right lung base appears increased from 01/22/2019, but right upper lobe opacity at that time has resolved. No pneumothorax, pulmonary edema, pleural effusion or other confluent opacity. No acute osseous abnormality identified. IMPRESSION: 1. Suspect acute airspace opacity at the right lung base superimposed on chronic elevation/eventration of the right hemidiaphragm. Atelectasis possible but consider pneumonia or aspiration in this clinical setting. 2. No other acute cardiopulmonary abnormality. Electronically Signed   By: Genevie Ann M.D.   On: 04/22/2019 08:23    Pending Labs Unresulted Labs (From admission, onward)    Start     Ordered   04/23/19 3419  Basic metabolic panel  Tomorrow morning,   R     04/22/19 1223   04/23/19 0500  CBC WITH DIFFERENTIAL  Tomorrow morning,   R     04/22/19 1223   04/22/19 1219  Culture, sputum-assessment  Once,   R    Question:  Patient immune status  Answer:  Normal   04/22/19 1223   04/22/19 1219  Strep pneumoniae urinary antigen  Once,   STAT     04/22/19 1223   04/22/19 0657  Culture, blood (Routine x 2)  BLOOD CULTURE X 2,   STAT     04/22/19 0657          Vitals/Pain Today's Vitals   04/22/19 1300 04/22/19 1315 04/22/19 1330 04/22/19 1407  BP: (!) 126/56  (!) 137/47 (!) 142/54  Pulse: 61 70 75 (!) 58  Resp: (!) 26 (!) 24  18  Temp:    99.2 F (37.3 C)  TempSrc:      SpO2: 97% 98% 100% 99%  Weight:      Height:      PainSc:        Isolation Precautions No active isolations  Medications Medications  azithromycin (ZITHROMAX) 500 mg in sodium chloride 0.9 % 250 mL IVPB (0 mg Intravenous Stopped 04/22/19 1016)  aspirin EC tablet 81 mg (has no  administration in time range)  lisinopril (ZESTRIL) tablet 5 mg (has no administration in time range)  rosuvastatin (CRESTOR) tablet 10 mg (has no administration in time range)  Insulin Glargine (LANTUS) Solostar Pen 15 Units (has no administration in time range)  pantoprazole (PROTONIX) EC tablet 40 mg (has no administration in time range)  gabapentin (NEURONTIN) capsule 300 mg (has no administration in time range)  albuterol (PROVENTIL) (2.5 MG/3ML) 0.083% nebulizer solution 3 mL (has no administration in time range)  fluticasone (FLONASE) 50 MCG/ACT nasal spray 2 spray (has no administration in time range)  sodium chloride (OCEAN) 0.65 % nasal spray 1 spray (has no administration in time range)  naphazoline-glycerin (CLEAR EYES REDNESS) ophth solution 1-2 drop (has no administration in time range)  enoxaparin (LOVENOX) injection 40 mg (has no administration in time range)  0.9 %  sodium chloride infusion ( Intravenous New Bag/Given 04/22/19 1257)  Ampicillin-Sulbactam (UNASYN) 3 g in sodium chloride 0.9 % 100 mL IVPB (3 g  Intravenous New Bag/Given 04/22/19 1316)  sodium chloride 0.9 % bolus 1,000 mL (0 mLs Intravenous Stopped 04/22/19 1016)    Mobility walks High fall risk   Focused Assessments Pulmonary Assessment Handoff:  Lung sounds:   O2 Device: Nasal Cannula O2 Flow Rate (L/min): 4 L/min      R Recommendations: See Admitting Provider Note  Report given to:   Additional Notes:

## 2019-04-22 NOTE — Evaluation (Signed)
Clinical/Bedside Swallow Evaluation Patient Details  Name: Jackson [name] MRN: [id number] Date of Birth: 03-Oct-1928  Today's Date: 04/22/2019 Time: SLP Start Time (ACUTE ONLY): 2637 SLP Stop Time (ACUTE ONLY): 1606 SLP Time Calculation (min) (ACUTE ONLY): 18 min  Past Medical History:  Past Medical History:  Diagnosis Date  . Aortic stenosis    Echo 3/18: mild aortic stenosis (mean 12/peak 22 // Echo 3/19: mild AS (mean 12, peak 20) // Echo 02/2019: EF 85-88, grade 1 diastolic dysfunction, mild MAC, mild aortic stenosis (mean gradient 9), mild dilation of the ascending aorta (38 mm)   . Atherosclerosis of aorta ([place]) 12/16/2016   CXR 6/16: IMPRESSION: 1. Resolved left basilar airspace opacities. 2. Hazy nodularity in both lungs compatible with the patient's known pleural plaques. 3. Right anterior hemidiaphragmatic eventration. 4. Thoracic spondylosis. 5. Atherosclerotic calcification of the aortic arch.  . Bradycardia    Low HR (30s) noted during sleep during admx for sepsis in 2016; Mobitz 1, 2:1 block >> no indication for pacer; management of OSA recommended // transient CHB noted on ECG during admit in 10/2018   . Diabetes mellitus without complication ([place])   . Diabetic neuropathy ([place])   . DJD (degenerative joint disease)    knee, hip  . Echocardiogram    Echo 6/16: mod LVH, EF 65-70, vigorous LVF, no RWMA, mild MR, mild LAE // Echo 3/18: EF 60-65, Gr 1 DD, mild AS, PASP 47 // Echo 3/19: EF 60-65, Gr 1 DD, mild AS (mean 12) // Echo 7/19: EF 55-60, Gr 1 DD, PASP 36  . GERD (gastroesophageal reflux disease)   . Heart murmur   . HLD (hyperlipidemia)   . Hypertension   . Hyperthyroidism   . Lexiscan Myoview 06/2018   Normal perfusion, EF 81; Low Risk  . Mild diastolic dysfunction 50/27/7412   Noted on Echocardiogram; Patient does not require diuretic Rx  . Phlebitis of left leg   . Pneumonia 02/2015   admx with sepsis    Past Surgical History:  Past Surgical History:  Procedure  Laterality Date  . CATARACT EXTRACTION Right   . CIRCUMCISION    . FRACTURE SURGERY    . TOTAL HIP ARTHROPLASTY Left 09/24/2017   Procedure: LEFT TOTAL HIP ARTHROPLASTY ANTERIOR APPROACH;  Surgeon: Mcarthur Rossetti, MD;  Location: [facility];  Service: Orthopedics;  Laterality: Left;   HPI:  Pt is an 85 yo male admitted with SOB and fever after nausea/vomiting at home. Pt has a h/o similar episodes resulting in aspiration PNA. SLP has evaluated x2 previously with oropharyngeal swallow appearing WFL but with concern for a primary esophageal issue. PMH includes: GERD, PNA, HTN, HLD, DM   Assessment / Plan / Recommendation Clinical Impression  Pt's oropharyngeal swallow appears functional. He describes eating a large amount of sweets late last night, and attributes his nausea/vomiting to this. He can verbalize foods that are better avoided to reduce symptoms of GERD, and he describes utilizing esophageal precautions at home. He does say that he will start to "cheat" and have or do things that trigger his reflux because he had been tolerating foods well for a while. Recommend starting regular solids and thin liquids. Would continue to use esophageal and aspiration precautions. SLP f/u not acutely indicated, but could consider additional esophageal and/or GI work up if symptoms persist.  SLP Visit Diagnosis: Dysphagia, unspecified (R13.10)    Aspiration Risk  Mild aspiration risk    Diet Recommendation Regular;Thin liquid   Liquid Administration via: Cup;Straw  Medication Administration: Whole meds with liquid Supervision: Patient able to self feed;Intermittent supervision to cue for compensatory strategies Compensations: Slow rate;Small sips/bites Postural Changes: Seated upright at 90 degrees;Remain upright for at least 30 minutes after po intake    Other  Recommendations Recommended Consults: Consider GI evaluation;Consider esophageal assessment Oral Care Recommendations: Oral care BID    Follow up Recommendations None      Frequency and Duration            Prognosis Prognosis for Safe Diet Advancement: Good      Swallow Study   General HPI: Pt is an 85 yo male admitted with SOB and fever after nausea/vomiting at home. Pt has a h/o similar episodes resulting in aspiration PNA. SLP has evaluated x2 previously with oropharyngeal swallow appearing WFL but with concern for a primary esophageal issue. PMH includes: GERD, PNA, HTN, HLD, DM Type of Study: Bedside Swallow Evaluation Previous Swallow Assessment: see HPI Diet Prior to this Study: NPO Temperature Spikes Noted: Yes(102.9) Respiratory Status: Room air History of Recent Intubation: No Behavior/Cognition: Alert;Cooperative;Pleasant mood Oral Cavity Assessment: Within Functional Limits Oral Care Completed by SLP: No Oral Cavity - Dentition: Adequate natural dentition Vision: Functional for self-feeding Self-Feeding Abilities: Able to feed self Patient Positioning: Upright in bed Baseline Vocal Quality: Normal    Oral/Motor/Sensory Function     Ice Chips Ice chips: Not tested   Thin Liquid Thin Liquid: Within functional limits Presentation: Cup;Self Fed;Straw    Nectar Thick Nectar Thick Liquid: Not tested   Honey Thick Honey Thick Liquid: Not tested   Puree Puree: Within functional limits Presentation: Self Fed;Spoon   Solid     Solid: Within functional limits Presentation: [place] 04/22/2019,4:25 PM  Jackson [name], M.A. [place] Acute Environmental education officer [phone] Office [phone]

## 2019-04-22 NOTE — Progress Notes (Signed)
Pharmacy Antibiotic Note  Jackson [name] is a 85 y.o. male admitted on 04/22/2019 with aspiration pneumonia and sepsis.  Pharmacy has been consulted for Unasyn dosing for aspiration pneumonia. WBC 13.5. Tmax 102.9. Lactic acid 2.6 >>1.8. Patient received ceftriaxone and azithromycin in the ED.   Plan: Start unasyn 3g IV q6h  Monitor creatinine clearance, C/S, and clinical progression   Height: 5\' 9"  (175.3 cm) Weight: 160 lb (72.6 kg) IBW/kg (Calculated) : 70.7  Temp (24hrs), Avg:102.9 F (39.4 C), Min:102.9 F (39.4 C), Max:102.9 F (39.4 C)  Recent Labs  Lab 04/22/19 0700 04/22/19 0911  WBC 13.5*  --   CREATININE 1.38*  --   LATICACIDVEN 2.6* 1.8    Estimated Creatinine Clearance: 37 mL/min (A) (by C-G formula based on SCr of 1.38 mg/dL (H)).    Allergies  Allergen Reactions  . Actos [Pioglitazone] Swelling  . Formaldehyde Other (See Comments)    Flu-like symptoms  . Lipitor [Atorvastatin Calcium]   . Metformin And Related     Antimicrobials this admission: Unasyn 8/1 >> Azithromycin 8/1 >> Ceftriaxone 2g x1   Microbiology results: 8/1 BCx: sent 8/1 Sputum: sent 8/1 covid: negative  Thank you for allowing pharmacy to be a part of this patient's care.  Cristela Felt, PharmD PGY1 Pharmacy Resident [employer]: [phone]  04/22/2019 12:45 PM

## 2019-04-22 NOTE — ED Notes (Signed)
RN unable to take report at this time 

## 2019-04-23 LAB — BASIC METABOLIC PANEL
Anion gap: 10 (ref 5–15)
BUN: 20 mg/dL (ref 8–23)
CO2: 28 mmol/L (ref 22–32)
Calcium: 9 mg/dL (ref 8.9–10.3)
Chloride: 101 mmol/L (ref 98–111)
Creatinine, Ser: 1.44 mg/dL — ABNORMAL HIGH (ref 0.61–1.24)
GFR calc Af Amer: 50 mL/min — ABNORMAL LOW (ref >60)
GFR calc non Af Amer: 43 mL/min — ABNORMAL LOW (ref >60)
Glucose, Bld: 127 mg/dL — ABNORMAL HIGH (ref 70–99)
Potassium: 4.1 mmol/L (ref 3.5–5.1)
Sodium: 139 mmol/L (ref 135–145)

## 2019-04-23 LAB — GLUCOSE, CAPILLARY
Glucose-Capillary: 87 mg/dL (ref 70–99)
Glucose-Capillary: 221 mg/dL — ABNORMAL HIGH (ref 70–99)
Glucose-Capillary: 260 mg/dL — ABNORMAL HIGH (ref 70–99)

## 2019-04-23 LAB — CBC WITH DIFFERENTIAL/PLATELET
Abs Immature Granulocytes: 0.04 10*3/uL (ref 0.00–0.07)
Basophils Absolute: 0.1 10*3/uL (ref 0.0–0.1)
Basophils Relative: 0 %
Eosinophils Absolute: 0.4 10*3/uL (ref 0.0–0.5)
Eosinophils Relative: 3 %
HCT: 37.5 % — ABNORMAL LOW (ref 39.0–52.0)
Hemoglobin: 12.2 g/dL — ABNORMAL LOW (ref 13.0–17.0)
Immature Granulocytes: 0 %
Lymphocytes Relative: 12 %
Lymphs Abs: 1.5 10*3/uL (ref 0.7–4.0)
MCH: 30.7 pg (ref 26.0–34.0)
MCHC: 32.5 g/dL (ref 30.0–36.0)
MCV: 94.2 fL (ref 80.0–100.0)
Monocytes Absolute: 1 10*3/uL (ref 0.1–1.0)
Monocytes Relative: 9 %
Neutro Abs: 8.9 10*3/uL — ABNORMAL HIGH (ref 1.7–7.7)
Neutrophils Relative %: 76 %
Platelets: 176 10*3/uL (ref 150–400)
RBC: 3.98 MIL/uL — ABNORMAL LOW (ref 4.22–5.81)
RDW: 13.2 % (ref 11.5–15.5)
WBC: 11.9 10*3/uL — ABNORMAL HIGH (ref 4.0–10.5)
nRBC: 0 % (ref 0.0–0.2)

## 2019-04-23 MED ORDER — SODIUM CHLORIDE 0.9 % IV SOLN
3.0000 g | Freq: Three times a day (TID) | INTRAVENOUS | Status: DC
  Administered 2019-04-23 – 2019-04-24 (×4): 3 g via INTRAVENOUS
  Filled 2019-04-23: qty 3
  Filled 2019-04-23 (×4): qty 8
  Filled 2019-04-23: qty 3

## 2019-04-23 NOTE — Progress Notes (Signed)
PROGRESS NOTE  THEODEN MAUCH MPN:[id number] DOB: 1929/03/01 DOA: 04/22/2019 PCP: Josetta Huddle, MD   LOS: 1 day   Brief narrative: Jackson [name] is a 85 y.o. male with extensive past medical history including dysphagia, recurrent aspiration pneumonia, hypertension, hyperlipidemia, diabetes mellitus chronic diastolic CHF, hypothyroidism who was brought to the ED for worsening productive cough, fever and shortness shortness of breath. In the ED, fever of 102.9, will decide 85% room air which improved to more than 90% with 3 to 4 L oxygen by nasal cannula.   Chest x-ray showed right lower lobe infiltrate. Work-up showed WBC 11 elevated 11.9, lactic acid level elevated to 2.6, procalcitonin level elevated to 0.82.  Subjective: Patient was seen and examined this morning. Propped up in bed.  Not in distress.  Sitting comfortably on room air at the time of my evaluation.  Assessment/Plan:  Principal Problem:   Sepsis due to pneumonia [facility]) Active Problems:   Type 2 diabetes mellitus with hyperglycemia (HCC)   Essential hypertension   Dyslipidemia   Chronic diastolic CHF (congestive heart failure) (HCC)  Sepsis secondary to pneumonia -likely aspiration pneumonia -Presented with shortness of breath, cough.  History of dysphagia. -Chest x-ray showed right lower lobe infiltrate. -Met sepsis criteria on admission. -Blood culture sent.  Broad-spectrum antibiotic started with IV Unasyn and IV azithromycin..  IV fluids started.  Lactic acid level normalized. -Speech therapy evaluation ordered. -Pending sputum culture. -Also started on prednisone 20 mg daily for 5 days. -Continue bronchodilators.  DM -Continue Lantus and sliding scale with Accu-Cheks.  HTN -Continue Lisinopril, but his GFR is chronically decreasing and so this may need to be changed in the future  HLD -Continue Crestor  Body mass index is 24.33 kg/m. Mobility: Encourage ambulation.  Patient states that he can  ambulate independently at home. Diet: Carb modified diet.  Speech therapy evaluated. DVT prophylaxis:  On Eliquis Code Status:   Code Status: DNR  Family Communication:  Expected Discharge:  Anticipate discharge home in next 1 to 2 days  Consultants:  None  Procedures:  None  Antimicrobials:  Anti-infectives (From admission, onward)   Start     Dose/Rate Route Frequency Ordered Stop   04/23/19 1407  Ampicillin-Sulbactam (UNASYN) 3 g in sodium chloride 0.9 % 100 mL IVPB     3 g 200 mL/hr over 30 Minutes Intravenous Every 8 hours 04/23/19 0735     04/22/19 1300  Ampicillin-Sulbactam (UNASYN) 3 g in sodium chloride 0.9 % 100 mL IVPB  Status:  Discontinued     3 g 200 mL/hr over 30 Minutes Intravenous Every 6 hours 04/22/19 1244 04/23/19 0735   04/22/19 0715  cefTRIAXone (ROCEPHIN) 2 g in sodium chloride 0.9 % 100 mL IVPB  Status:  Discontinued     2 g 200 mL/hr over 30 Minutes Intravenous Every 24 hours 04/22/19 0708 04/22/19 1223   04/22/19 0715  azithromycin (ZITHROMAX) 500 mg in sodium chloride 0.9 % 250 mL IVPB     500 mg 250 mL/hr over 60 Minutes Intravenous Every 24 hours 04/22/19 0708        Infusions:  . sodium chloride 75 mL/hr at 04/23/19 0606  . ampicillin-sulbactam (UNASYN) IV 3 g (04/23/19 1241)  . azithromycin 500 mg (04/23/19 0452)    Scheduled Meds: . aspirin EC  81 mg Oral Daily  . enoxaparin (LOVENOX) injection  40 mg Subcutaneous Q24H  . fluticasone  2 spray Each Nare BID  . gabapentin  300 mg Oral QHS  .  insulin aspart  0-15 Units Subcutaneous TID WC  . insulin aspart  0-5 Units Subcutaneous QHS  . insulin glargine  15 Units Subcutaneous Q2200  . lisinopril  5 mg Oral Daily  . pantoprazole  40 mg Oral Daily  . predniSONE  20 mg Oral Q breakfast  . rosuvastatin  10 mg Oral QHS    PRN meds: acetaminophen, albuterol, naphazoline-glycerin, sodium chloride   Objective: Vitals:   04/23/19 0846 04/23/19 1215  BP: (!) 114/91 (!) 118/98  Pulse:  80   Resp:  19  Temp:  98.9 F (37.2 C)  SpO2:  97%    Intake/Output Summary (Last 24 hours) at 04/23/2019 1432 Last data filed at 04/23/2019 1109 Gross per 24 hour  Intake 1759.51 ml  Output 200 ml  Net 1559.51 ml   Filed Weights   04/22/19 0655  Weight: 72.6 kg   Weight change:  Body mass index is 24.33 kg/m.   Physical Exam: General exam: Appears calm and comfortable.  Propped up in bed Skin: No rashes, lesions or ulcers. HEENT: Atraumatic, normocephalic, supple neck, no obvious bleeding Lungs: Decreased air entry in both bases more on the right, no crackles, no wheezing CVS: First and second heart sound heard, no murmur GI/Abd soft, nondistended, nontender, bowel sound present CNS: Alert, awake, oriented x3 Psychiatry: Mood appropriate Extremities: No pedal edema, no calf tenderness  Data Review: I have personally reviewed the laboratory data and studies available.  Recent Labs  Lab 04/22/19 0700 04/23/19 0442  WBC 13.5* 11.9*  NEUTROABS 11.4* 8.9*  HGB 14.1 12.2*  HCT 42.7 37.5*  MCV 92.2 94.2  PLT 215 176   Recent Labs  Lab 04/22/19 0700 04/23/19 0442  NA 137 139  K 4.0 4.1  CL 100 101  CO2 28 28  GLUCOSE 247* 127*  BUN 22 20  CREATININE 1.38* 1.44*  CALCIUM 9.5 9.0    Terrilee Croak, MD  [facility] 04/23/2019

## 2019-04-24 LAB — GLUCOSE, CAPILLARY
Glucose-Capillary: 146 mg/dL — ABNORMAL HIGH (ref 70–99)
Glucose-Capillary: 284 mg/dL — ABNORMAL HIGH (ref 70–99)

## 2019-04-24 MED ORDER — AMOXICILLIN-POT CLAVULANATE 875-125 MG PO TABS: 1.0000 | ORAL_TABLET | Freq: Two times a day (BID) | ORAL | 0 refills | Status: AC

## 2019-04-24 MED ORDER — PREDNISONE 20 MG PO TABS: 20.0000 mg | ORAL_TABLET | Freq: Every day | ORAL | 0 refills | Status: AC

## 2019-04-24 MED ORDER — LACTINEX PO CHEW: 1.0000 | CHEWABLE_TABLET | Freq: Three times a day (TID) | ORAL | 0 refills | Status: AC

## 2019-04-24 NOTE — Consult Note (Signed)
   [facility] CM Inpatient Consult   04/24/2019  Jackson [name] [birth year] [id number]  Patient screened for high risk score for unplanned readmission score with 3 hospitalizations in the past 6 months noted. Patient with [facility] HMO in the [place].  Review of patient's medical record reviewed for disposition and needs reveals patient is Jackson [name] a 85 y.o.malewithextensive past medical history including dysphagia, recurrent aspiration pneumonia, hypertension, hyperlipidemia, diabetes mellitus chronic diastolic CHF, hypothyroidismwhowas brought to the ED for worsening productive cough,fever and shortness shortness of breath. In the ED, fever of 102.9, will decide 85% room air which improved to more than 90% with 3 to 4 L oxygen by nasal cannula.  Chest x-ray showed right lower lobe infiltrate. Work-up showed WBC 11 elevated 11.9, lactic acid level elevated to 2.6,procalcitonin level elevated to 0.82.  Primary Care Provider is Dr. Josetta Huddle, [facility] Physician.  This office provides the transition of care follow up.  Reviewed inpatient TOC notes.   Plan:  Follow up with General EMMI calls. No other needs noted for care management at this time.  For questions contact:   Natividad Brood, RN BSN [facility] Liaison  [phone] business mobile phone Toll free office [phone]  Fax number: [phone] [email or web address]

## 2019-04-24 NOTE — TOC Initial Note (Signed)
Transition of Care [facility]) - Initial/Assessment Note    Patient Details  Name: Jackson [name] MRN: [id number] Date of Birth: October 25, 1928  Transition [facility]) CM/SW Contact:    Mearl Latin, LCSW Phone Number: 04/24/2019, 11:41 AM  Clinical Narrative:                 High readmission risk completed with patient. PCP confirmed; no difficulty accessing medication. Patient reports he lives at home with his wife. He states he will do his best to stay on a good medical regiment and diet at home. No further needs reported. Wife on her way to the hospital to pick him up.   Expected Discharge Plan: Home/Self Care Barriers to Discharge: No Barriers Identified   Patient Goals and CMS Choice Patient states their goals for this hospitalization and ongoing recovery are:: Return home   Choice offered to / list presented to : NA  Expected Discharge Plan and Services Expected Discharge Plan: Home/Self Care In-house Referral: NA Discharge Planning Services: NA Post Acute Care Choice: NA Living arrangements for the past 2 months: Single Family Home Expected Discharge Date: 04/24/19               DME Arranged: N/A DME Agency: NA       HH Arranged: NA          Prior Living Arrangements/Services Living arrangements for the past 2 months: Single Family Home Lives with:: Spouse Patient language and need for interpreter reviewed:: Yes Do you feel safe going back to the place where you live?: Yes      Need for Family Participation in Patient Care: No (Comment) Care giver support system in place?: Yes (comment)   Criminal Activity/Legal Involvement Pertinent to Current Situation/Hospitalization: No - Comment as needed  Activities of Daily Living Home Assistive Devices/Equipment: Blood pressure cuff, CBG Meter, Dentures (specify type), Grab bars in shower(partials) ADL Screening (condition at time of admission) Patient's cognitive ability adequate to safely complete daily activities?:  Yes Is the patient deaf or have difficulty hearing?: Yes Does the patient have difficulty seeing, even when wearing glasses/contacts?: No Does the patient have difficulty concentrating, remembering, or making decisions?: No Patient able to express need for assistance with ADLs?: Yes Does the patient have difficulty dressing or bathing?: No Independently performs ADLs?: Yes (appropriate for developmental age) Does the patient have difficulty walking or climbing stairs?: No Weakness of Legs: None Weakness of Arms/Hands: None  Permission Sought/Granted                  Emotional Assessment Appearance:: Appears stated age Attitude/Demeanor/Rapport: Engaged, Charismatic, Gracious Affect (typically observed): Pleasant, Accepting, Appropriate Orientation: : Oriented to Self, Oriented to Place, Oriented to  Time, Oriented to Situation Alcohol / Substance Use: Not Applicable Psych Involvement: No (comment)  Admission diagnosis:  Pain [R52] Acute febrile illness [R50.9] Nausea and vomiting in adult [R11.2] Aspiration pneumonia of right lower lobe due to vomit (HCC) [J69.0] Community acquired pneumonia of right lower lobe of lung (HCC) [J18.1] Patient Active Problem List   Diagnosis Date Noted  . Aspiration into airway 01/22/2019  . Essential hypertension 11/18/2018  . Dyslipidemia 11/18/2018  . Chronic diastolic CHF (congestive heart failure) (HCC) 11/18/2018  . Status post total replacement of left hip 09/24/2017  . Pain in left hip 08/11/2017  . Unilateral primary osteoarthritis, left hip 08/11/2017  . Bradycardia 12/16/2016  . Atherosclerosis of aorta (HCC) 12/16/2016  . Aortic stenosis   . Severe sepsis (HCC)  02/25/2015  . Bilateral pneumonia 02/25/2015  . Lactic acidosis 02/25/2015  . Hypoxia 02/25/2015  . Sepsis due to pneumonia ([employer]) 02/25/2015  . Acute on chronic respiratory failure with hypoxia ([place])   . CAP (community acquired pneumonia)   . SOB (shortness of  breath)   . Type 2 diabetes mellitus with hyperglycemia ([place])    PCP:  Josetta Huddle, MD Pharmacy:   [id number] - [place], [place] - [address] [place] [address] Phone: [phone] Fax: [phone]     Social Determinants of Health (SDOH) Interventions    Readmission Risk Interventions Readmission Risk Prevention Plan 04/24/2019  Transportation Screening Complete  PCP or Specialist Appt within 3-5 Days Complete  HRI or [place] Complete  Social Work Consult for [place] Planning/Counseling Complete  Palliative Care Screening Not Applicable  Medication Review Press photographer) Complete  Some recent data might be hidden

## 2019-04-24 NOTE — Progress Notes (Signed)
Discharge instructions given. Pt verbalized understanding and all questions were answered.  

## 2019-04-24 NOTE — Discharge Summary (Signed)
Physician Discharge Summary  Jackson [name] JHE:[id number] DOB: Jun 16, 1929 DOA: 04/22/2019  PCP: Jackson Huddle, MD  Admit date: 04/22/2019 Discharge date: 04/24/2019  Admitted From: home Discharge disposition: home   Code Status: DNR   Recommendations for Outpatient Follow-Up:   1. F/u with ENT  Discharge Diagnosis:   Principal Problem:   Sepsis due to pneumonia [name]) Active Problems:   Type 2 diabetes mellitus with hyperglycemia (HCC)   Essential hypertension   Dyslipidemia   Chronic diastolic CHF (congestive heart failure) ([name]   History of Present Illness / Brief narrative:  Jackson Dorn [name] a 85 y.o.malewith extensive past medical history including dysphagia, recurrent aspiration pneumonia, hypertension, hyperlipidemia, diabetes mellitus chronic diastolic CHF, hypothyroidism who was brought to the ED for worsening productive cough, fever and shortness shortness of breath. In the ED, fever of 102.9, will decide 85% room air which improved to more than 90% with 3 to 4 L oxygen by nasal cannula.   Chest x-ray showed right lower lobe infiltrate. Work-up showed WBC 11 elevated 11.9, lactic acid level elevated to 2.6, procalcitonin level elevated to 0.82.  Subjective:  Patient seen and examined this morning.  Pleasant elderly Caucasian male.  Propped up in bed.  Not in distress.  Breathing comfortably on room air.  States that he was able to ambulate in the hallway without oxygen supplementation yesterday.  [name] Course:  Sepsis secondary to pneumonia -likely aspiration pneumonia -Presented with shortness of breath, cough.  History of dysphagia. -Chest x-ray showed right lower lobe infiltrate. -Met sepsis criteria on admission. -Blood culture sent.  Broad-spectrum antibiotic started with IV Unasyn and IV azithromycin..  IV fluids started.  Lactic acid level normalized. -Speech therapy evaluation obtained.  Cleared for regular diet. -No growth in blood culture on sputum  culture so far. -Okay to discharge today on 1 more week of oral Augmentin along with probiotics. -Also started on prednisone 20 mg daily for 5 days. -Continue bronchodilators.  DM -Continue Lantus and sliding scale with Accu-Cheks.  HTN -Continue Lisinopril, but his GFR is chronically decreasing and so this may need to be changed in the future  HLD -Continue Crestor  Chronic sinusitis -Patient states that he has chronic postnasal drip which can lead to coughing fit especially when he reclines.  He suspects he might have had similar symptoms leading to presentation to ED this time.  Uses Flonase at home.  He has been started on 5-day course of prednisone here in the [name] which we will continue.  I ordered for an ambulatory referral to ENT.  Stable for discharge to home today.  Discharge Exam:   Vitals:   04/23/19 0846 04/23/19 1215 04/23/19 2121 04/24/19 0523  BP: (!) 114/91 (!) 118/98 (!) 153/70 (!) 151/55  Pulse:  80 67 (!) 48  Resp:  19    Temp:  98.9 F (37.2 C) 97.7 F (36.5 C) 98.1 F (36.7 C)  TempSrc:  Oral Oral   SpO2:  97% 95% 96%  Weight:      Height:        Body mass index is 24.33 kg/m.  General exam: Appears calm and comfortable.  Skin: No rashes, lesions or ulcers. HEENT: Atraumatic, normocephalic, supple neck, no obvious bleeding Lungs: Clear to auscultation bilaterally, except for mild diminished air entry on the right lung base CVS: Regular rate and rhythm, no murmur GI/Abd soft, nontender, nondistended, bowel sound present CNS: Alert, awake, oriented x3 Psychiatry: Mood appropriate Extremities: No pedal edema, no calf tenderness  Discharge Instructions:  Wound care: None Discharge Instructions    Diet - low sodium heart healthy   Complete by: As directed    Increase activity slowly   Complete by: As directed       Allergies as of 04/24/2019      Reactions   Actos [pioglitazone] Swelling   Formaldehyde Other (See Comments)    Flu-like symptoms   Lipitor [atorvastatin Calcium]    Metformin And Related       Medication List    TAKE these medications   Accu-Chek Aviva Plus test strip Generic drug: glucose blood   albuterol 108 (90 Base) MCG/ACT inhaler Commonly known as: VENTOLIN HFA Inhale 2 puffs into the lungs every 6 (six) hours as needed for wheezing or shortness of breath.   amoxicillin-clavulanate 875-125 MG tablet Commonly known as: Augmentin Take 1 tablet by mouth every 12 (twelve) hours for 7 days.   aspirin EC 81 MG tablet Take 81 mg by mouth daily.   CALCIUM 600+D PO Take 1 tablet by mouth daily.   docusate sodium 100 MG capsule Commonly known as: COLACE Take 100 mg by mouth at bedtime.   fluticasone 50 MCG/ACT nasal spray Commonly known as: FLONASE Place 2 sprays into both nostrils 2 (two) times daily.   gabapentin 100 MG capsule Commonly known as: NEURONTIN Take 300 mg by mouth at bedtime.   insulin aspart 100 UNIT/ML FlexPen Commonly known as: NovoLOG FlexPen Before each meal 3 times a day, 140-199 - 2 units, 200-250 - 4 units, 251-299 - 6 units,  300-349 - 8 units,  350 or above 10 units.   Insulin Glargine 100 UNIT/ML Solostar Pen Commonly known as: LANTUS Inject 15 Units into the skin daily at 10 pm.   lactobacillus acidophilus & bulgar chewable tablet Chew 1 tablet by mouth 3 (three) times daily with meals for 7 days.   lisinopril 5 MG tablet Commonly known as: ZESTRIL Take 1 tablet (5 mg total) by mouth daily.   MULTIVITAMIN PO Take 1 tablet by mouth daily. Centrum Silver   omeprazole 20 MG capsule Commonly known as: PRILOSEC Take 20 mg by mouth daily.   predniSONE 20 MG tablet Commonly known as: DELTASONE Take 1 tablet (20 mg total) by mouth daily with breakfast for 5 days. Start taking on: April 25, 2019   rosuvastatin 10 MG tablet Commonly known as: CRESTOR Take 1 tablet (10 mg total) by mouth at bedtime.   sodium chloride 0.65 % Soln nasal spray  Commonly known as: OCEAN Place 1 spray into both nostrils as needed for congestion.   tetrahydrozoline-zinc 0.05-0.25 % ophthalmic solution Commonly known as: VISINE-AC Place 2 drops into both eyes 3 (three) times daily as needed (dry eyes).       Time coordinating discharge: 35 minutes  The results of significant diagnostics from this hospitalization (including imaging, microbiology, ancillary and laboratory) are listed below for reference.    Procedures and Diagnostic Studies:   Dg Chest Port 1 View  Result Date: 04/22/2019 CLINICAL DATA:  85 year old male with suspected sepsis.  Vomiting. EXAM: PORTABLE CHEST 1 VIEW COMPARISON:  Portable chest 01/22/2019 and earlier. FINDINGS: Portable AP semi upright view at 0803 hours. Chronic elevation and eventration of the right hemidiaphragm. Stable cardiac size at the upper limits of normal. Other mediastinal contours are within normal limits. Visualized tracheal air column is within normal limits. Patchy opacity at the medial right lung base appears increased from 01/22/2019, but right upper lobe opacity at that time  has resolved. No pneumothorax, pulmonary edema, pleural effusion or other confluent opacity. No acute osseous abnormality identified. IMPRESSION: 1. Suspect acute airspace opacity at the right lung base superimposed on chronic elevation/eventration of the right hemidiaphragm. Atelectasis possible but consider pneumonia or aspiration in this clinical setting. 2. No other acute cardiopulmonary abnormality. Electronically Signed   By: Genevie Ann M.D.   On: 04/22/2019 08:23     Labs:   Basic Metabolic Panel: Recent Labs  Lab 04/22/19 0700 04/23/19 0442  NA 137 139  K 4.0 4.1  CL 100 101  CO2 28 28  GLUCOSE 247* 127*  BUN 22 20  CREATININE 1.38* 1.44*  CALCIUM 9.5 9.0   GFR Estimated Creatinine Clearance: 34.3 mL/min (A) (by C-G formula based on SCr of 1.44 mg/dL (H)). Liver Function Tests: Recent Labs  Lab 04/22/19 0700   AST 21  ALT 21  ALKPHOS 68  BILITOT 0.8  PROT 6.4*  ALBUMIN 3.6   No results for input(s): LIPASE, AMYLASE in the last 168 hours. No results for input(s): AMMONIA in the last 168 hours. Coagulation profile Recent Labs  Lab 04/22/19 0700  INR 1.0    CBC: Recent Labs  Lab 04/22/19 0700 04/23/19 0442  WBC 13.5* 11.9*  NEUTROABS 11.4* 8.9*  HGB 14.1 12.2*  HCT 42.7 37.5*  MCV 92.2 94.2  PLT 215 176   Cardiac Enzymes: No results for input(s): CKTOTAL, CKMB, CKMBINDEX, TROPONINI in the last 168 hours. BNP: Invalid input(s): POCBNP CBG: Recent Labs  Lab 04/22/19 2129 04/23/19 0805 04/23/19 1154 04/23/19 1645 04/24/19 0825  GLUCAP 199* 87 221* 260* 146*   D-Dimer No results for input(s): DDIMER in the last 72 hours. Hgb A1c No results for input(s): HGBA1C in the last 72 hours. Lipid Profile No results for input(s): CHOL, HDL, LDLCALC, TRIG, CHOLHDL, LDLDIRECT in the last 72 hours. Thyroid function studies No results for input(s): TSH, T4TOTAL, T3FREE, THYROIDAB in the last 72 hours.  Invalid input(s): FREET3 Anemia work up No results for input(s): VITAMINB12, FOLATE, FERRITIN, TIBC, IRON, RETICCTPCT in the last 72 hours. Microbiology Recent Results (from the past 240 hour(s))  Culture, blood (Routine x 2)     Status: None (Preliminary result)   Collection Time: 04/22/19  7:00 AM   Specimen: BLOOD RIGHT HAND  Result Value Ref Range Status   Specimen Description BLOOD RIGHT HAND  Final   Special Requests   Final    BOTTLES DRAWN AEROBIC AND ANAEROBIC Blood Culture adequate volume   Culture   Final    NO GROWTH 2 DAYS Performed at [name], [address], [place], [place] [address]    Report Status PENDING  Incomplete  Culture, blood (Routine x 2)     Status: None (Preliminary result)   Collection Time: 04/22/19  7:00 AM   Specimen: BLOOD LEFT HAND  Result Value Ref Range Status   Specimen Description BLOOD LEFT HAND  Final   Special Requests    Final    BOTTLES DRAWN AEROBIC AND ANAEROBIC Blood Culture adequate volume   Culture   Final    NO GROWTH 2 DAYS Performed at [name], [address], [place], [place] [address]    Report Status PENDING  Incomplete  SARS Coronavirus 2 [facility] order, Performed in [name]) Nasopharyngeal Nasopharyngeal Swab     Status: None   Collection Time: 04/22/19  7:35 AM   Specimen: Nasopharyngeal Swab  Result Value Ref Range Status  SARS Coronavirus 2 NEGATIVE NEGATIVE Final    Comment: (NOTE) If result is NEGATIVE SARS-CoV-2 target nucleic acids are NOT DETECTED. The SARS-CoV-2 RNA is generally detectable in upper and lower  respiratory specimens during the acute phase of infection. The lowest  concentration of SARS-CoV-2 viral copies this assay can detect is 250  copies / mL. A negative result does not preclude SARS-CoV-2 infection  and should not be used as the sole basis for treatment or other  patient management decisions.  A negative result may occur with  improper specimen collection / handling, submission of specimen other  than nasopharyngeal swab, presence of viral mutation(s) within the  areas targeted by this assay, and inadequate number of viral copies  (<250 copies / mL). A negative result must be combined with clinical  observations, patient history, and epidemiological information. If result is POSITIVE SARS-CoV-2 target nucleic acids are DETECTED. The SARS-CoV-2 RNA is generally detectable in upper and lower  respiratory specimens dur ing the acute phase of infection.  Positive  results are indicative of active infection with SARS-CoV-2.  Clinical  correlation with patient history and other diagnostic information is  necessary to determine patient infection status.  Positive results do  not rule out bacterial infection or co-infection with other viruses. If result is PRESUMPTIVE POSTIVE SARS-CoV-2 nucleic acids MAY BE PRESENT.   A presumptive  positive result was obtained on the submitted specimen  and confirmed on repeat testing.  While 2019 novel coronavirus  (SARS-CoV-2) nucleic acids may be present in the submitted sample  additional confirmatory testing may be necessary for epidemiological  and / or clinical management purposes  to differentiate between  SARS-CoV-2 and other Sarbecovirus currently known to infect humans.  If clinically indicated additional testing with an alternate test  methodology [phone]) is advised. The SARS-CoV-2 RNA is generally  detectable in upper and lower respiratory sp ecimens during the acute  phase of infection. The expected result is Negative. Fact Sheet for Patients:  StrictlyIdeas.no Fact Sheet for Healthcare Providers: BankingDealers.co.za This test is not yet approved or cleared by the [place] FDA and has been authorized for detection and/or diagnosis of SARS-CoV-2 by FDA under an Emergency Use Authorization (EUA).  This EUA will remain in effect (meaning this test can be used) for the duration of the COVID-19 declaration under Section 564(b)(1) of the Act, 21 U.S.C. section 360bbb-3(b)(1), unless the authorization is terminated or revoked sooner. Performed at [name], [place] [address], [place], [place] [address]     Signed: Terrilee Croak  [facility] 04/24/2019, 10:35 AM

## 2019-04-27 LAB — CULTURE, BLOOD (ROUTINE X 2)
Culture: NO GROWTH
Culture: NO GROWTH
Special Requests: ADEQUATE
Special Requests: ADEQUATE

## 2019-05-02 DIAGNOSIS — J690 Pneumonitis due to inhalation of food and vomit: Secondary | ICD-10-CM | POA: Diagnosis not present

## 2019-05-02 DIAGNOSIS — Z6841 Body Mass Index (BMI) 40.0 and over, adult: Secondary | ICD-10-CM | POA: Diagnosis not present

## 2019-05-04 ENCOUNTER — Other Ambulatory Visit: Payer: Self-pay | Admitting: Cardiology

## 2019-05-04 DIAGNOSIS — I440 Atrioventricular block, first degree: Secondary | ICD-10-CM

## 2019-06-20 ENCOUNTER — Encounter: Payer: Self-pay | Admitting: Physician Assistant

## 2019-06-20 ENCOUNTER — Other Ambulatory Visit: Payer: Self-pay

## 2019-06-20 ENCOUNTER — Encounter (INDEPENDENT_AMBULATORY_CARE_PROVIDER_SITE_OTHER): Payer: Self-pay

## 2019-06-20 ENCOUNTER — Ambulatory Visit (INDEPENDENT_AMBULATORY_CARE_PROVIDER_SITE_OTHER): Payer: Medicare HMO | Admitting: Physician Assistant

## 2019-06-20 VITALS — BP 136/68 | HR 69 | Ht 68.0 in | Wt 181.1 lb

## 2019-06-20 DIAGNOSIS — I442 Atrioventricular block, complete: Secondary | ICD-10-CM | POA: Diagnosis not present

## 2019-06-20 DIAGNOSIS — I10 Essential (primary) hypertension: Secondary | ICD-10-CM | POA: Diagnosis not present

## 2019-06-20 DIAGNOSIS — I700 Atherosclerosis of aorta: Secondary | ICD-10-CM

## 2019-06-20 DIAGNOSIS — E785 Hyperlipidemia, unspecified: Secondary | ICD-10-CM

## 2019-06-20 DIAGNOSIS — Z794 Long term (current) use of insulin: Secondary | ICD-10-CM

## 2019-06-20 DIAGNOSIS — I350 Nonrheumatic aortic (valve) stenosis: Secondary | ICD-10-CM | POA: Diagnosis not present

## 2019-06-20 DIAGNOSIS — E1165 Type 2 diabetes mellitus with hyperglycemia: Secondary | ICD-10-CM | POA: Diagnosis not present

## 2019-06-20 NOTE — Progress Notes (Signed)
Cardiology Office Note:    Date:  06/20/2019   ID:  [name], DOB [birth year], MRN [id number]  PCP:  Josetta Huddle, MD  Cardiologist:  Virl Axe, MD / Richardson Dopp, PA-C  Electrophysiologist:  None   Referring MD: Josetta Huddle, MD   Chief Complaint  Patient presents with  . Follow-up    heart block, aortic stenosis    History of Present Illness:    [place] [name] is a 85 y.o. male with:  Heart block  1st degree AV block; Mobitz 1; 2:1 AV block   eval by Dr. Caryl Comes during admx for pneumonia/sepsis in 2016 >> no indication for PPM   admx 10/2018 with pneumonia; complete HB noted on 1 ECG; sinus brady on another  Reviewed with Dr. Caryl Comes >> HB w/ competing Jxnal beats; w/o symptoms >> no further testing  Aortic stenosis  Aortic atherosclerosis   Diabetes mellitus   Peripheral neuropathy   Hypertension   Pulmonary Hypertension   Echocardiogram 7/19: EF 55-60, Gr 1 DD, PASP 36  Hx of recurrent pneumonia  7/19 >> +Tn (demand ischemia) >> Myoview 10/19: low risk  Hx of DVT in 09/2017  Mr. Ashland was last seen via Telemedicine in 11/2018.  I asked Dr. Caryl Comes to review his ECGs from his admission in Feb 2020.  It appeared that he did have heart block but with competing junctional beats, it made interpretation difficult.  As the patient did not have symptoms, it was decided to hold off on monitoring.  Since last seen, he has been admitted twice (May 2020 and August 2020) with recurrent pneumonia and sepsis.  He returns for follow up.  He is here alone.  Since discharge from the hospital, he has felt well.  He is increased the incline of his bed and has not really had any further episodes of aspiration.  He has not had chest pain, significant shortness of breath, orthopnea, lower extremity swelling.  He has not had syncope, near syncope or decreased exercise tolerance.   Prior CV studies:   The following studies were reviewed today:  Echocardiogram 03/10/2019 EF  60-65, impaired relaxation (grade 1 diastolic dysfunction), mild MAC, moderate calcification of aortic valve, mild aortic stenosis (mean gradient 9 mmHg), mild dilation of ascending aorta (38 mm)  Carotid US 05/2018 (Dr. Irven Shelling office) No hemodynamically significant arterial disease in the internal carotid arteries bilaterally  Myoview 06/2018 (Dr. Irven Shelling office) Normal perfusion, EF 81; Low Risk  Echocardiogram 03/28/2018 Mild LVH, EF 55-60, normal wall motion, grade 1 diastolic dysfunction, mildly dilated aortic root, mildly dilated ascending aorta (38 mm), MAC, mild LAE, PASP 36  Echo 12/13/17 Mild LVH, EF 60-65, normal wall motion, grade 1 diastolic dysfunction, mild aortic stenosis (mean 12, peak 20), mildly dilated aortic root and ascending aorta (root 39, ascending aorta 38), MAC, trivial MR, normal RVSF  Echo 12/15/16 Mild LVH, EF 60-65, normal wall motion, grade 1 diastolic dysfunction, mild aortic stenosis (mean 12/peak 22), trivial MR, normal RVSF, PASP 47 (mild pulmonary hypertension)  Echo 02/26/15 Moderate LVH, EF 65-70, normal wall motion, mild MR, mild LAE  Past Medical History:  Diagnosis Date  . Aortic stenosis    Echo 3/18: mild aortic stenosis (mean 12/peak 22 // Echo 3/19: mild AS (mean 12, peak 20) // Echo 02/2019: EF 10-25, grade 1 diastolic dysfunction, mild MAC, mild aortic stenosis (mean gradient 9), mild dilation of the ascending aorta (38 mm)   . Atherosclerosis of aorta ([place]) 12/16/2016   CXR 6/16:  IMPRESSION: 1. Resolved left basilar airspace opacities. 2. Hazy nodularity in both lungs compatible with the patient's known pleural plaques. 3. Right anterior hemidiaphragmatic eventration. 4. Thoracic spondylosis. 5. Atherosclerotic calcification of the aortic arch.  . Bradycardia    Low HR (30s) noted during sleep during admx for sepsis in 2016; Mobitz 1, 2:1 block >> no indication for pacer; management of OSA recommended // transient CHB noted on ECG during admit  in 10/2018   . Diabetes mellitus without complication ([place])   . Diabetic neuropathy ([place])   . DJD (degenerative joint disease)    knee, hip  . Echocardiogram    Echo 6/16: mod LVH, EF 65-70, vigorous LVF, no RWMA, mild MR, mild LAE // Echo 3/18: EF 60-65, Gr 1 DD, mild AS, PASP 47 // Echo 3/19: EF 60-65, Gr 1 DD, mild AS (mean 12) // Echo 7/19: EF 55-60, Gr 1 DD, PASP 36  . GERD (gastroesophageal reflux disease)   . Heart murmur   . HLD (hyperlipidemia)   . Hypertension   . Hyperthyroidism   . Lexiscan Myoview 06/2018   Normal perfusion, EF 81; Low Risk  . Mild diastolic dysfunction 02/54/2706   Noted on Echocardiogram; Patient does not require diuretic Rx  . Phlebitis of left leg   . Pneumonia 02/2015   admx with sepsis    Surgical Hx: The patient  has a past surgical history that includes Circumcision; Cataract extraction (Right); Total hip arthroplasty (Left, 09/24/2017); and Fracture surgery.   Current Medications: Current Meds  Medication Sig  . ACCU-CHEK AVIVA PLUS test strip   . albuterol (PROVENTIL HFA;VENTOLIN HFA) 108 (90 Base) MCG/ACT inhaler Inhale 2 puffs into the lungs every 6 (six) hours as needed for wheezing or shortness of breath.  Marland Kitchen aspirin EC 81 MG tablet Take 81 mg by mouth daily.  . Calcium Carbonate-Vitamin D (CALCIUM 600+D PO) Take 1 tablet by mouth daily.  Marland Kitchen docusate sodium (COLACE) 100 MG capsule Take 100 mg by mouth at bedtime.  . fluticasone (FLONASE) 50 MCG/ACT nasal spray Place 2 sprays into both nostrils 2 (two) times daily.  Marland Kitchen gabapentin (NEURONTIN) 100 MG capsule Take 300 mg by mouth at bedtime.   . insulin aspart (NOVOLOG FLEXPEN) 100 UNIT/ML FlexPen Before each meal 3 times a day, 140-199 - 2 units, 200-250 - 4 units, 251-299 - 6 units,  300-349 - 8 units,  350 or above 10 units.  . Insulin Glargine (LANTUS) 100 UNIT/ML Solostar Pen Inject 15 Units into the skin daily at 10 pm.  . lisinopril (PRINIVIL,ZESTRIL) 5 MG tablet Take 1 tablet (5 mg total) by  mouth daily.  . Multiple Vitamins-Minerals (MULTIVITAMIN PO) Take 1 tablet by mouth daily. Centrum Silver  . omeprazole (PRILOSEC) 20 MG capsule Take 20 mg by mouth daily.   . rosuvastatin (CRESTOR) 10 MG tablet TAKE 1 TABLET AT BEDTIME  . sodium chloride (OCEAN) 0.65 % SOLN nasal spray Place 1 spray into both nostrils as needed for congestion.  Marland Kitchen tetrahydrozoline-zinc (VISINE-AC) 0.05-0.25 % ophthalmic solution Place 2 drops into both eyes 3 (three) times daily as needed (dry eyes).     Allergies:   Actos [pioglitazone], Formaldehyde, Lipitor [atorvastatin calcium], and Metformin and related   Social History   Tobacco Use  . Smoking status: Former Smoker    Quit date: 1960    Years since quitting: 60.7  . Smokeless tobacco: Never Used  Substance Use Topics  . Alcohol use: No  . Drug use: No  Family Hx: The patient's family history includes Diabetes in his brother, mother, and sister. There is no history of Heart attack, Stroke, or Hypertension.  ROS:   Please see the history of present illness.    Review of Systems  Gastrointestinal: Negative for hematochezia.  Genitourinary: Negative for hematuria.   All other systems reviewed and are negative.   EKGs/Labs/Other Test Reviewed:    EKG:  EKG is   ordered today.  The ekg ordered today demonstrates sinus rhythm, heart rate 65, left axis deviation, first-degree AV block, PR 368, QTC 397  Recent Labs: 11/18/2018: B Natriuretic Peptide 205.0 04/22/2019: ALT 21 04/23/2019: BUN 20; Creatinine, Ser 1.44; Hemoglobin 12.2; Platelets 176; Potassium 4.1; Sodium 139   Recent Lipid Panel No results found for: CHOL, TRIG, HDL, CHOLHDL, LDLCALC, LDLDIRECT     Physical Exam:    VS:  BP 136/68   Pulse 69   Ht _0  (1.727 m)   Wt 181 lb 1.9 oz (82.2 kg)   SpO2 96%   BMI 27.54 kg/m     Wt Readings from Last 3 Encounters:  06/20/19 181 lb 1.9 oz (82.2 kg)  04/22/19 160 lb (72.6 kg)  01/24/19 187 lb 13.3 oz (85.2 kg)      Physical Exam  Constitutional: He is oriented to person, place, and time. He appears well-developed and well-nourished. No distress.  HENT:  Head: Normocephalic and atraumatic.  Eyes: No scleral icterus.  Neck: No JVD present. No thyromegaly present.  Cardiovascular: Normal rate and regular rhythm.  Murmur heard.  Systolic murmur is present with a grade of 2/6 at the upper right sternal border. Pulmonary/Chest: Effort normal and breath sounds normal. He has no rales.  Abdominal: Soft. There is no hepatomegaly.  Musculoskeletal:        General: No edema.  Lymphadenopathy:    He has no cervical adenopathy.  Neurological: He is alert and oriented to person, place, and time.  Skin: Skin is warm and dry.  Psychiatric: He has a normal mood and affect.    ASSESSMENT & PLAN:    1. Transient complete heart block ([place]) He remains asymptomatic.  It seems that he typically has transient heart block when he was hospitalized with an acute illness.  ECG today demonstrates Sinus rhythm with first-degree AV block. .  I have reviewed his ECGs again with Dr. Caryl Comes. As he remains asymptomatic, we agree that we should just continue to monitor.  The patient understands that he should follow-up sooner if he has symptoms of syncope, near syncope or decreased exercise tolerance.    2. Aortic valve stenosis, etiology of cardiac valve disease unspecified Mild by most recent echocardiogram.  Mean gradient 9.  3. Atherosclerosis of aorta (HCC) Continue statin therapy.  4. Essential hypertension The patient's blood pressure is controlled on his current regimen.  Continue current therapy.   5. Type 2 diabetes mellitus with hyperglycemia, with long-term current use of insulin (HCC) Most recent hemoglobin A1c 8.2.  Continue follow-up with primary care.  6. Hyperlipidemia, unspecified hyperlipidemia type Managed by primary care.     Dispo:  Return in about 6 months (around 12/18/2019) for Routine Follow Up w/  Dr. Caryl Comes, or Richardson Dopp, PA-C, in person.   Medication Adjustments/Labs and Tests Ordered: Current medicines are reviewed at length with the patient today.  Concerns regarding medicines are outlined above.  Tests Ordered: Orders Placed This Encounter  Procedures  . EKG 12-Lead   Medication Changes: No orders of the defined types  were placed in this encounter.   Signed, Richardson Dopp, PA-C  06/20/2019 2:24 PM    [facility] [place], [place], [place]  [address] Phone: [phone]; Fax: [phone]

## 2019-06-20 NOTE — Patient Instructions (Signed)
Medication Instructions:  Your physician recommends that you continue on your current medications as directed. Please refer to the Current Medication list given to you today.  If you need a refill on your cardiac medications before your next appointment, please call your pharmacy.   Lab work: NONE ORDERED  TODAY   If you have labs (blood work) drawn today and your tests are completely normal, you will receive your results only by: Marland Kitchen MyChart Message (if you have MyChart) OR . A paper copy in the mail If you have any lab test that is abnormal or we need to change your treatment, we will call you to review the results.  Testing/Procedures: NONE ORDERED  TODAY   Follow-Up: At [facility], you and your health needs are our priority.  As part of our continuing mission to provide you with exceptional heart care, we have created designated Provider Care Teams.  These Care Teams include your primary Cardiologist (physician) and Advanced Practice Providers (APPs -  Physician Assistants and Nurse Practitioners) who all work together to provide you with the care you need, when you need it. You will need a follow up appointment in:  6 months.  Please call our office 2 months in advance to schedule this appointment.  You may see Virl Axe, MD or one of the following Advanced Practice Providers on your designated Care Team: Tommye Standard PA-C . Chanetta Marshall NP . Joesph July PA-C   Any Other Special Instructions Will Be Listed Below (If Applicable).

## 2019-07-19 DIAGNOSIS — Z23 Encounter for immunization: Secondary | ICD-10-CM | POA: Diagnosis not present

## 2019-09-27 ENCOUNTER — Other Ambulatory Visit: Payer: Self-pay | Admitting: Cardiology

## 2019-09-27 DIAGNOSIS — I440 Atrioventricular block, first degree: Secondary | ICD-10-CM

## 2019-10-17 ENCOUNTER — Ambulatory Visit: Payer: Medicare HMO

## 2019-10-26 ENCOUNTER — Ambulatory Visit: Payer: Medicare HMO | Attending: Internal Medicine

## 2019-10-26 DIAGNOSIS — Z23 Encounter for immunization: Secondary | ICD-10-CM | POA: Insufficient documentation

## 2019-10-26 NOTE — Progress Notes (Signed)
   Covid-19 Vaccination Clinic  Name:  [name]    MRN: [id number] DOB: October 01, 1928  10/26/2019  [name] was observed post Covid-19 immunization for 15 minutes without incidence. He was provided with Vaccine Information Sheet and instruction to access the V-Safe system.   [name] was instructed to call 911 with any severe reactions post vaccine: Marland Kitchen Difficulty breathing  . Swelling of your face and throat  . A fast heartbeat  . A bad rash all over your body  . Dizziness and weakness    Immunizations Administered    Name Date Dose VIS Date Route   Pfizer COVID-19 Vaccine 10/26/2019  9:09 AM 0.3 mL 09/01/2019 Intramuscular   Manufacturer: [employer], [employer]   Lot: [id number]   NDC: [address]-5

## 2019-11-03 ENCOUNTER — Ambulatory Visit: Payer: Medicare HMO

## 2019-11-20 ENCOUNTER — Ambulatory Visit: Payer: Medicare HMO | Attending: Internal Medicine

## 2019-11-20 DIAGNOSIS — Z23 Encounter for immunization: Secondary | ICD-10-CM | POA: Insufficient documentation

## 2019-11-20 NOTE — Progress Notes (Signed)
   Covid-19 Vaccination Clinic  Name:  BERN FARE    MRN: [id number] DOB: [birth year]  11/20/2019  Mr. Livsey was observed post Covid-19 immunization for 15 minutes without incidence. He was provided with Vaccine Information Sheet and instruction to access the V-Safe system.   Mr. Loescher was instructed to call 911 with any severe reactions post vaccine: Marland Kitchen Difficulty breathing  . Swelling of your face and throat  . A fast heartbeat  . A bad rash all over your body  . Dizziness and weakness    Immunizations Administered    Name Date Dose VIS Date Route   Pfizer COVID-19 Vaccine 11/20/2019  1:35 PM 0.3 mL 09/01/2019 Intramuscular   Manufacturer: [employer], [employer]   Lot: [id number]   NDC: [address]-6

## 2019-11-24 ENCOUNTER — Other Ambulatory Visit: Payer: Self-pay | Admitting: Cardiology

## 2019-11-24 DIAGNOSIS — I10 Essential (primary) hypertension: Secondary | ICD-10-CM

## 2020-01-15 DIAGNOSIS — E1140 Type 2 diabetes mellitus with diabetic neuropathy, unspecified: Secondary | ICD-10-CM | POA: Diagnosis not present

## 2020-01-15 DIAGNOSIS — N183 Chronic kidney disease, stage 3 unspecified: Secondary | ICD-10-CM | POA: Diagnosis not present

## 2020-01-15 DIAGNOSIS — M169 Osteoarthritis of hip, unspecified: Secondary | ICD-10-CM | POA: Diagnosis not present

## 2020-01-15 DIAGNOSIS — E113299 Type 2 diabetes mellitus with mild nonproliferative diabetic retinopathy without macular edema, unspecified eye: Secondary | ICD-10-CM | POA: Diagnosis not present

## 2020-01-15 DIAGNOSIS — E11319 Type 2 diabetes mellitus with unspecified diabetic retinopathy without macular edema: Secondary | ICD-10-CM | POA: Diagnosis not present

## 2020-01-15 DIAGNOSIS — M1990 Unspecified osteoarthritis, unspecified site: Secondary | ICD-10-CM | POA: Diagnosis not present

## 2020-01-15 DIAGNOSIS — E1142 Type 2 diabetes mellitus with diabetic polyneuropathy: Secondary | ICD-10-CM | POA: Diagnosis not present

## 2020-02-01 DIAGNOSIS — E1142 Type 2 diabetes mellitus with diabetic polyneuropathy: Secondary | ICD-10-CM | POA: Diagnosis not present

## 2020-02-01 DIAGNOSIS — E1140 Type 2 diabetes mellitus with diabetic neuropathy, unspecified: Secondary | ICD-10-CM | POA: Diagnosis not present

## 2020-02-01 DIAGNOSIS — M1990 Unspecified osteoarthritis, unspecified site: Secondary | ICD-10-CM | POA: Diagnosis not present

## 2020-02-01 DIAGNOSIS — M169 Osteoarthritis of hip, unspecified: Secondary | ICD-10-CM | POA: Diagnosis not present

## 2020-02-01 DIAGNOSIS — N183 Chronic kidney disease, stage 3 unspecified: Secondary | ICD-10-CM | POA: Diagnosis not present

## 2020-02-01 DIAGNOSIS — E11319 Type 2 diabetes mellitus with unspecified diabetic retinopathy without macular edema: Secondary | ICD-10-CM | POA: Diagnosis not present

## 2020-02-13 DIAGNOSIS — R350 Frequency of micturition: Secondary | ICD-10-CM | POA: Diagnosis not present

## 2020-02-13 DIAGNOSIS — R309 Painful micturition, unspecified: Secondary | ICD-10-CM | POA: Diagnosis not present

## 2020-02-22 DIAGNOSIS — E1140 Type 2 diabetes mellitus with diabetic neuropathy, unspecified: Secondary | ICD-10-CM | POA: Diagnosis not present

## 2020-03-04 DIAGNOSIS — M1990 Unspecified osteoarthritis, unspecified site: Secondary | ICD-10-CM | POA: Diagnosis not present

## 2020-03-04 DIAGNOSIS — E1140 Type 2 diabetes mellitus with diabetic neuropathy, unspecified: Secondary | ICD-10-CM | POA: Diagnosis not present

## 2020-03-04 DIAGNOSIS — M169 Osteoarthritis of hip, unspecified: Secondary | ICD-10-CM | POA: Diagnosis not present

## 2020-03-04 DIAGNOSIS — E1142 Type 2 diabetes mellitus with diabetic polyneuropathy: Secondary | ICD-10-CM | POA: Diagnosis not present

## 2020-03-04 DIAGNOSIS — E11319 Type 2 diabetes mellitus with unspecified diabetic retinopathy without macular edema: Secondary | ICD-10-CM | POA: Diagnosis not present

## 2020-03-04 DIAGNOSIS — N183 Chronic kidney disease, stage 3 unspecified: Secondary | ICD-10-CM | POA: Diagnosis not present

## 2020-03-14 ENCOUNTER — Emergency Department (HOSPITAL_COMMUNITY): Payer: Medicare HMO

## 2020-03-14 ENCOUNTER — Inpatient Hospital Stay (HOSPITAL_COMMUNITY)
Admission: AD | Admit: 2020-03-14 | Discharge: 2020-03-16 | DRG: 871 | Disposition: A | Discharge status: Home / Self Care | Payer: Medicare HMO | Attending: Internal Medicine | Admitting: Internal Medicine

## 2020-03-14 ENCOUNTER — Other Ambulatory Visit: Payer: Self-pay

## 2020-03-14 ENCOUNTER — Encounter (HOSPITAL_COMMUNITY): Payer: Self-pay | Admitting: Radiology

## 2020-03-14 DIAGNOSIS — E1165 Type 2 diabetes mellitus with hyperglycemia: Secondary | ICD-10-CM | POA: Diagnosis present

## 2020-03-14 DIAGNOSIS — Z87891 Personal history of nicotine dependence: Secondary | ICD-10-CM

## 2020-03-14 DIAGNOSIS — I110 Hypertensive heart disease with heart failure: Secondary | ICD-10-CM | POA: Diagnosis present

## 2020-03-14 DIAGNOSIS — K219 Gastro-esophageal reflux disease without esophagitis: Secondary | ICD-10-CM | POA: Diagnosis present

## 2020-03-14 DIAGNOSIS — Z79899 Other long term (current) drug therapy: Secondary | ICD-10-CM

## 2020-03-14 DIAGNOSIS — J189 Pneumonia, unspecified organism: Secondary | ICD-10-CM | POA: Diagnosis not present

## 2020-03-14 DIAGNOSIS — E1142 Type 2 diabetes mellitus with diabetic polyneuropathy: Secondary | ICD-10-CM | POA: Diagnosis present

## 2020-03-14 DIAGNOSIS — I5032 Chronic diastolic (congestive) heart failure: Secondary | ICD-10-CM | POA: Diagnosis present

## 2020-03-14 DIAGNOSIS — J9811 Atelectasis: Secondary | ICD-10-CM | POA: Diagnosis not present

## 2020-03-14 DIAGNOSIS — Z833 Family history of diabetes mellitus: Secondary | ICD-10-CM

## 2020-03-14 DIAGNOSIS — I10 Essential (primary) hypertension: Secondary | ICD-10-CM | POA: Diagnosis present

## 2020-03-14 DIAGNOSIS — Z7982 Long term (current) use of aspirin: Secondary | ICD-10-CM | POA: Diagnosis not present

## 2020-03-14 DIAGNOSIS — Z66 Do not resuscitate: Secondary | ICD-10-CM | POA: Diagnosis not present

## 2020-03-14 DIAGNOSIS — Z8672 Personal history of thrombophlebitis: Secondary | ICD-10-CM | POA: Diagnosis not present

## 2020-03-14 DIAGNOSIS — I350 Nonrheumatic aortic (valve) stenosis: Secondary | ICD-10-CM | POA: Diagnosis not present

## 2020-03-14 DIAGNOSIS — E785 Hyperlipidemia, unspecified: Secondary | ICD-10-CM | POA: Diagnosis present

## 2020-03-14 DIAGNOSIS — E039 Hypothyroidism, unspecified: Secondary | ICD-10-CM | POA: Diagnosis not present

## 2020-03-14 DIAGNOSIS — Z794 Long term (current) use of insulin: Secondary | ICD-10-CM

## 2020-03-14 DIAGNOSIS — Z96642 Presence of left artificial hip joint: Secondary | ICD-10-CM | POA: Diagnosis present

## 2020-03-14 DIAGNOSIS — R0602 Shortness of breath: Secondary | ICD-10-CM | POA: Diagnosis not present

## 2020-03-14 DIAGNOSIS — R509 Fever, unspecified: Secondary | ICD-10-CM | POA: Diagnosis not present

## 2020-03-14 DIAGNOSIS — Z8701 Personal history of pneumonia (recurrent): Secondary | ICD-10-CM | POA: Diagnosis not present

## 2020-03-14 DIAGNOSIS — A419 Sepsis, unspecified organism: Principal | ICD-10-CM | POA: Diagnosis present

## 2020-03-14 DIAGNOSIS — Z20822 Contact with and (suspected) exposure to covid-19: Secondary | ICD-10-CM | POA: Diagnosis not present

## 2020-03-14 DIAGNOSIS — I959 Hypotension, unspecified: Secondary | ICD-10-CM | POA: Diagnosis not present

## 2020-03-14 DIAGNOSIS — R042 Hemoptysis: Secondary | ICD-10-CM | POA: Diagnosis present

## 2020-03-14 DIAGNOSIS — R0902 Hypoxemia: Secondary | ICD-10-CM | POA: Diagnosis not present

## 2020-03-14 LAB — COMPREHENSIVE METABOLIC PANEL
ALT: 20 U/L (ref 0–44)
AST: 17 U/L (ref 15–41)
Albumin: 3.7 g/dL (ref 3.5–5.0)
Alkaline Phosphatase: 60 U/L (ref 38–126)
Anion gap: 9 (ref 5–15)
BUN: 19 mg/dL (ref 8–23)
CO2: 28 mmol/L (ref 22–32)
Calcium: 9 mg/dL (ref 8.9–10.3)
Chloride: 101 mmol/L (ref 98–111)
Creatinine, Ser: 1.06 mg/dL (ref 0.61–1.24)
GFR calc Af Amer: >60 mL/min (ref >60)
GFR calc non Af Amer: >60 mL/min (ref >60)
Glucose, Bld: 192 mg/dL — ABNORMAL HIGH (ref 70–99)
Potassium: 3.9 mmol/L (ref 3.5–5.1)
Sodium: 138 mmol/L (ref 135–145)
Total Bilirubin: 0.7 mg/dL (ref 0.3–1.2)
Total Protein: 6.6 g/dL (ref 6.5–8.1)

## 2020-03-14 LAB — CBC WITH DIFFERENTIAL/PLATELET
Abs Immature Granulocytes: 0.05 10*3/uL (ref 0.00–0.07)
Basophils Absolute: 0 10*3/uL (ref 0.0–0.1)
Basophils Relative: 0 %
Eosinophils Absolute: 0.1 10*3/uL (ref 0.0–0.5)
Eosinophils Relative: 0 %
HCT: 41.5 % (ref 39.0–52.0)
Hemoglobin: 13.9 g/dL (ref 13.0–17.0)
Immature Granulocytes: 0 %
Lymphocytes Relative: 4 %
Lymphs Abs: 0.5 10*3/uL — ABNORMAL LOW (ref 0.7–4.0)
MCH: 31.9 pg (ref 26.0–34.0)
MCHC: 33.5 g/dL (ref 30.0–36.0)
MCV: 95.2 fL (ref 80.0–100.0)
Monocytes Absolute: 1 10*3/uL (ref 0.1–1.0)
Monocytes Relative: 8 %
Neutro Abs: 11 10*3/uL — ABNORMAL HIGH (ref 1.7–7.7)
Neutrophils Relative %: 88 %
Platelets: 209 10*3/uL (ref 150–400)
RBC: 4.36 MIL/uL (ref 4.22–5.81)
RDW: 12.8 % (ref 11.5–15.5)
WBC: 12.7 10*3/uL — ABNORMAL HIGH (ref 4.0–10.5)
nRBC: 0 % (ref 0.0–0.2)

## 2020-03-14 LAB — D-DIMER, QUANTITATIVE: D-Dimer, Quant: 2.51 ug/mL-FEU — ABNORMAL HIGH (ref 0.00–0.50)

## 2020-03-14 LAB — GLUCOSE, CAPILLARY: Glucose-Capillary: 175 mg/dL — ABNORMAL HIGH (ref 70–99)

## 2020-03-14 LAB — URINALYSIS, ROUTINE W REFLEX MICROSCOPIC
Bilirubin Urine: NEGATIVE
Glucose, UA: NEGATIVE mg/dL
Hgb urine dipstick: NEGATIVE
Ketones, ur: NEGATIVE mg/dL
Leukocytes,Ua: NEGATIVE
Nitrite: NEGATIVE
Protein, ur: NEGATIVE mg/dL
Specific Gravity, Urine: 1.017 (ref 1.005–1.030)
pH: 5 (ref 5.0–8.0)

## 2020-03-14 LAB — LACTIC ACID, PLASMA: Lactic Acid, Venous: 1 mmol/L (ref 0.5–1.9)

## 2020-03-14 LAB — APTT: aPTT: 26 seconds (ref 24–36)

## 2020-03-14 LAB — SARS CORONAVIRUS 2 BY RT PCR (HOSPITAL ORDER, PERFORMED IN ~~LOC~~ HOSPITAL LAB): SARS Coronavirus 2: NEGATIVE

## 2020-03-14 LAB — PROTIME-INR
INR: 1.1 (ref 0.8–1.2)
Prothrombin Time: 13.3 seconds (ref 11.4–15.2)

## 2020-03-14 MED ORDER — HEPARIN SODIUM (PORCINE) 5000 UNIT/ML IJ SOLN
5000.0000 [IU] | Freq: Three times a day (TID) | INTRAMUSCULAR | Status: DC
  Administered 2020-03-14 – 2020-03-15 (×4): 5000 [IU] via SUBCUTANEOUS
  Filled 2020-03-14 (×5): qty 1

## 2020-03-14 MED ORDER — ASPIRIN EC 81 MG PO TBEC
81.0000 mg | DELAYED_RELEASE_TABLET | Freq: Every day | ORAL | Status: DC
  Administered 2020-03-15 – 2020-03-16 (×2): 81 mg via ORAL
  Filled 2020-03-14 (×2): qty 1

## 2020-03-14 MED ORDER — ACETAMINOPHEN 325 MG PO TABS
650.0000 mg | ORAL_TABLET | Freq: Once | ORAL | Status: AC
  Administered 2020-03-14: 650 mg via ORAL
  Filled 2020-03-14: qty 2

## 2020-03-14 MED ORDER — ACETAMINOPHEN 650 MG RE SUPP: 650.0000 mg | Freq: Four times a day (QID) | RECTAL | Status: DC | PRN

## 2020-03-14 MED ORDER — SODIUM CHLORIDE (PF) 0.9 % IJ SOLN
INTRAMUSCULAR | Status: AC
  Filled 2020-03-14: qty 50

## 2020-03-14 MED ORDER — FLUTICASONE PROPIONATE 50 MCG/ACT NA SUSP
2.0000 | Freq: Two times a day (BID) | NASAL | Status: DC
  Administered 2020-03-14 – 2020-03-16 (×4): 2 via NASAL
  Filled 2020-03-14: qty 16

## 2020-03-14 MED ORDER — SALINE SPRAY 0.65 % NA SOLN
1.0000 | NASAL | Status: DC | PRN
  Filled 2020-03-14: qty 44

## 2020-03-14 MED ORDER — PIPERACILLIN-TAZOBACTAM 3.375 G IVPB 30 MIN
3.3750 g | Freq: Once | INTRAVENOUS | Status: AC
  Administered 2020-03-14: 3.375 g via INTRAVENOUS
  Filled 2020-03-14: qty 50

## 2020-03-14 MED ORDER — INSULIN GLARGINE 100 UNIT/ML ~~LOC~~ SOLN
15.0000 [IU] | Freq: Every day | SUBCUTANEOUS | Status: DC
  Administered 2020-03-14 – 2020-03-15 (×2): 15 [IU] via SUBCUTANEOUS
  Filled 2020-03-14 (×2): qty 0.15

## 2020-03-14 MED ORDER — ACETAMINOPHEN 325 MG PO TABS: 650.0000 mg | ORAL_TABLET | Freq: Four times a day (QID) | ORAL | Status: DC | PRN

## 2020-03-14 MED ORDER — ONDANSETRON HCL 4 MG PO TABS: 4.0000 mg | ORAL_TABLET | Freq: Four times a day (QID) | ORAL | Status: DC | PRN

## 2020-03-14 MED ORDER — SODIUM CHLORIDE 0.9 % IV SOLN
500.0000 mg | INTRAVENOUS | Status: DC
  Administered 2020-03-14 – 2020-03-15 (×2): 500 mg via INTRAVENOUS
  Filled 2020-03-14 (×3): qty 500

## 2020-03-14 MED ORDER — SODIUM CHLORIDE 0.9 % IV SOLN
1.0000 g | INTRAVENOUS | Status: DC
  Administered 2020-03-14 – 2020-03-15 (×2): 1 g via INTRAVENOUS
  Filled 2020-03-14: qty 10
  Filled 2020-03-14: qty 1
  Filled 2020-03-14: qty 10

## 2020-03-14 MED ORDER — ONDANSETRON HCL 4 MG/2ML IJ SOLN: 4.0000 mg | Freq: Four times a day (QID) | INTRAMUSCULAR | Status: DC | PRN

## 2020-03-14 MED ORDER — DOCUSATE SODIUM 100 MG PO CAPS
100.0000 mg | ORAL_CAPSULE | Freq: Every day | ORAL | Status: DC
  Administered 2020-03-15: 100 mg via ORAL
  Filled 2020-03-14: qty 1

## 2020-03-14 MED ORDER — PANTOPRAZOLE SODIUM 40 MG PO TBEC
40.0000 mg | DELAYED_RELEASE_TABLET | Freq: Every day | ORAL | Status: DC
  Administered 2020-03-15 – 2020-03-16 (×2): 40 mg via ORAL
  Filled 2020-03-14 (×2): qty 1

## 2020-03-14 MED ORDER — IOHEXOL 350 MG/ML SOLN
100.0000 mL | Freq: Once | INTRAVENOUS | Status: AC | PRN
  Administered 2020-03-14: 69 mL via INTRAVENOUS

## 2020-03-14 MED ORDER — INSULIN ASPART 100 UNIT/ML ~~LOC~~ SOLN
0.0000 [IU] | Freq: Three times a day (TID) | SUBCUTANEOUS | Status: DC
  Administered 2020-03-15: 3 [IU] via SUBCUTANEOUS
  Administered 2020-03-15: 5 [IU] via SUBCUTANEOUS
  Administered 2020-03-16: 3 [IU] via SUBCUTANEOUS
  Administered 2020-03-16: 8 [IU] via SUBCUTANEOUS

## 2020-03-14 MED ORDER — PANTOPRAZOLE SODIUM 40 MG IV SOLR
40.0000 mg | Freq: Every day | INTRAVENOUS | Status: AC
  Administered 2020-03-15: 40 mg via INTRAVENOUS
  Filled 2020-03-14: qty 40

## 2020-03-14 MED ORDER — ROSUVASTATIN CALCIUM 10 MG PO TABS
10.0000 mg | ORAL_TABLET | Freq: Every day | ORAL | Status: DC
  Administered 2020-03-15: 10 mg via ORAL

## 2020-03-14 NOTE — ED Notes (Signed)
Family at bedside. 

## 2020-03-14 NOTE — H&P (Signed)
History and Physical  Jackson [name] WGN:[id number] DOB: 09/07/1929 DOA: 03/14/2020  Referring physician: Quintella Reichert, MD PCP: Jackson Huddle, MD  Outpatient Specialists: None Patient coming from: Home At his baseline ambulates independently  Chief Complaint: Cough and bloody sputum  HPI: Jackson [name] is a 86 y.o. male with medical history significant for HTN, diabetes, CHF with preserved EF, hypothyroidism, HLD, history of dysphagia and recurrent pneumonia who presented on 03/14/2020 with worsening shortness of breath, cough and chills .    Patient states several hours after eating (around 2:00 this morning) he felt significant chills with weakness and decreased ability to stand on his own.  His wife noted how poorly he left immediately called EMS.  Patient does not remember coughing or having episodes of emesis or bloody sputum.  In speaking with his wife, she reports he was fine when he went to bed. They sleep in separate rooms. She heard him and he said he was cold. He had coughed up some blood around 7: 30 am. She states he has been to the hospital approximately 4 times for pneumonia for the same lung ( his last hospital stay was about a year ago).  He has had a cough productive of green sputum for a few weeks that they attributed to allergies  In the ED patient was noted to have a fever of 102.9, respiratory rate of 24, SPO2 of 90%. Lab work notable for lactic acid of one, CMP largely unremarkable. WBC 12.7. D-dimer of 2.51. SARS PCR negative.  Chest x-ray showed patchy airspace disease of left mid to lower lung concerning for pneumonia. CTA chest given elevated D-dimer was obtained which was negative for PE but did show multifocal patchy airspace opacities in left lung concerning for multifocal pneumonia.  Patient was started on IV Zosyn in ED due to concern for aspiration pneumonia given reported history of dysphagia in the past.  [facility] was called for  further management and evaluation  Review of Systems:As mentioned in the history of present illness.Review of systems are otherwise negative Patient seen in the ED .   Past Medical History:  Diagnosis Date  . Aortic stenosis    Echo 3/18: mild aortic stenosis (mean 12/peak 22 // Echo 3/19: mild AS (mean 12, peak 20) // Echo 02/2019: EF 78-46, grade 1 diastolic dysfunction, mild MAC, mild aortic stenosis (mean gradient 9), mild dilation of the ascending aorta (38 mm)   . Atherosclerosis of aorta ([place]) 12/16/2016   CXR 6/16: IMPRESSION: 1. Resolved left basilar airspace opacities. 2. Hazy nodularity in both lungs compatible with the patient's known pleural plaques. 3. Right anterior hemidiaphragmatic eventration. 4. Thoracic spondylosis. 5. Atherosclerotic calcification of the aortic arch.  . Bradycardia    Low HR (30s) noted during sleep during admx for sepsis in 2016; [place] 1, 2:1 block >> no indication for pacer; management of OSA recommended // transient CHB noted on ECG during admit in 10/2018   . Diabetes mellitus without complication ([place])   . Diabetic neuropathy ([place])   . DJD (degenerative joint disease)    knee, hip  . Echocardiogram    Echo 6/16: mod LVH, EF 65-70, vigorous LVF, no RWMA, mild MR, mild LAE // Echo 3/18: EF 60-65, Gr 1 DD, mild AS, PASP 47 // Echo 3/19: EF 60-65, Gr 1 DD, mild AS (mean 12) // Echo 7/19: EF 55-60, Gr 1 DD, PASP 36  . GERD (gastroesophageal reflux disease)   . Heart murmur   . HLD (  hyperlipidemia)   . Hypertension   . Hyperthyroidism   . Lexiscan Myoview 06/2018   Normal perfusion, EF 81; Low Risk  . Mild diastolic dysfunction 84/16/6063   Noted on Echocardiogram; Patient does not require diuretic Rx  . Phlebitis of left leg   . Pneumonia 02/2015   admx with sepsis    Past Surgical History:  Procedure Laterality Date  . CATARACT EXTRACTION Right   . CIRCUMCISION    . FRACTURE SURGERY    . TOTAL HIP ARTHROPLASTY Left 09/24/2017   Procedure: LEFT  TOTAL HIP ARTHROPLASTY ANTERIOR APPROACH;  Surgeon: Jackson Rossetti, MD;  Location: [facility]S;  Service: Orthopedics;  Laterality: Left;   Allergies  Allergen Reactions  . Actos [Pioglitazone] Swelling  . Formaldehyde Other (See Comments)    Flu-like symptoms  . Lipitor [Atorvastatin Calcium]   . Metformin And Related    Social History:  reports that he quit smoking about 61 years ago. He has never used smokeless tobacco. He reports that he does not drink alcohol and does not use drugs. Family History  Problem Relation Age of Onset  . Diabetes Mother   . Diabetes Sister   . Diabetes Brother   . Heart attack Neg Hx   . Stroke Neg Hx   . Hypertension Neg Hx       Prior to Admission medications   Medication Sig Start Date End Date Taking? Authorizing Provider  aspirin EC 81 MG tablet Take 81 mg by mouth daily.   Yes [provider]  Calcium Carbonate-Vitamin D (CALCIUM 600+D PO) Take 1 tablet by mouth daily.   Yes [provider]  docusate sodium (COLACE) 100 MG capsule Take 100 mg by mouth at bedtime.   Yes [provider]  fluticasone (FLONASE) 50 MCG/ACT nasal spray Place 2 sprays into both nostrils 2 (two) times daily. 11/20/18  Yes [name], Jackson Picket, MD  gabapentin (NEURONTIN) 100 MG capsule Take 300 mg by mouth at bedtime.    Yes [provider]  insulin aspart (NOVOLOG FLEXPEN) 100 UNIT/ML FlexPen Before each meal 3 times a day, 140-199 - 2 units, 200-250 - 4 units, 251-299 - 6 units,  300-349 - 8 units,  350 or above 10 units. 02/28/15  Yes Jackson Reasons, MD  Insulin Glargine (LANTUS) 100 UNIT/ML Solostar Pen Inject 15 Units into the skin daily at 10 pm.   Yes [provider]  lisinopril (PRINIVIL,ZESTRIL) 5 MG tablet Take 1 tablet (5 mg total) by mouth daily. 12/26/18  Yes [name], Jackson J, MD  Multiple Vitamins-Minerals (MULTIVITAMIN PO) Take 1 tablet by mouth daily. Centrum Silver   Yes [provider]  omeprazole  (PRILOSEC) 20 MG capsule Take 20 mg by mouth daily.  10/20/16  Yes [provider]  rosuvastatin (CRESTOR) 10 MG tablet TAKE 1 TABLET AT BEDTIME Patient taking differently: Take 10 mg by mouth at bedtime.  09/29/19  Yes Adrian Prows, MD  sodium chloride (OCEAN) 0.65 % SOLN nasal spray Place 1 spray into both nostrils as needed for congestion. 11/20/18  Yes [name], Jackson Picket, MD  tetrahydrozoline-zinc (VISINE-AC) 0.05-0.25 % ophthalmic solution Place 2 drops into both eyes 3 (three) times daily as needed (dry eyes).   Yes [provider]  ACCU-CHEK AVIVA PLUS test strip  07/30/17   [provider]    Physical Exam: BP (!) 128/97 (BP Location: Right Arm)   Pulse 66   Temp 98 F (36.7 C) (Oral)   Resp 16   Wt  80.6 kg   SpO2 98%   BMI 27.02 kg/m   Constitutional normal appearing elderly male Eyes: EOMI, anicteric, normal conjunctivae ENMT: Oropharynx with moist mucous membranes, normal dentition Cardiovascular: RRR no MRGs, with no peripheral edema Respiratory: Normal respiratory effort on room air, clear breath sounds  Abdomen: Soft,non-tender, with no HSM Skin: No rash ulcers, or lesions. Without skin tenting  Neurologic: Grossly no focal neuro deficit. Psychiatric:Appropriate affect, and mood. Mental status AAOx3          Labs on Admission:  Basic Metabolic Panel: Recent Labs  Lab 03/14/20 1021  NA 138  K 3.9  CL 101  CO2 28  GLUCOSE 192*  BUN 19  CREATININE 1.06  CALCIUM 9.0   Liver Function Tests: Recent Labs  Lab 03/14/20 1021  AST 17  ALT 20  ALKPHOS 60  BILITOT 0.7  PROT 6.6  ALBUMIN 3.7   No results for input(s): LIPASE, AMYLASE in the last 168 hours. No results for input(s): AMMONIA in the last 168 hours. CBC: Recent Labs  Lab 03/14/20 1021  WBC 12.7*  NEUTROABS 11.0*  HGB 13.9  HCT 41.5  MCV 95.2  PLT 209   Cardiac Enzymes: No results for input(s): CKTOTAL, CKMB, CKMBINDEX, TROPONINI in the last 168 hours.  BNP  (last 3 results) No results for input(s): BNP in the last 8760 hours.  ProBNP (last 3 results) No results for input(s): PROBNP in the last 8760 hours.  CBG: No results for input(s): GLUCAP in the last 168 hours.  Radiological Exams on Admission: CT Angio Chest PE W/Cm &/Or Wo Cm  Result Date: 03/14/2020 CLINICAL DATA:  Shortness of breath hemoptysis EXAM: CT ANGIOGRAPHY CHEST WITH CONTRAST TECHNIQUE: Multidetector CT imaging of the chest was performed using the standard protocol during bolus administration of intravenous contrast. Multiplanar CT image reconstructions and MIPs were obtained to evaluate the vascular anatomy. CONTRAST:  65m OMNIPAQUE IOHEXOL 350 MG/ML SOLN COMPARISON:  March 19, 2008 FINDINGS: Cardiovascular: There is a optimal opacification of the pulmonary arteries. There is no central,segmental, or subsegmental filling defects within the pulmonary arteries. The heart is normal in size. No pericardial effusion or thickening. No evidence right heart strain. There is normal three-vessel brachiocephalic anatomy without proximal stenosis. Coronary artery calcifications are seen. Scattered aortic atherosclerosis is noted. Mediastinum/Nodes: No hilar, mediastinal, or axillary adenopathy. Thyroid gland, trachea, and esophagus demonstrate no significant findings. Lungs/Pleura: Again noted are scattered bilateral pleural plaques as on the prior exam, some of which are mildly calcified. There is multifocal patchy tree-in-bud and ground-glass opacities seen throughout the left lung, predominantly within the left lobe. No pleural effusion is seen. Upper Abdomen: No acute abnormalities present in the visualized portions of the upper abdomen. Musculoskeletal: No chest wall abnormality. No acute or significant osseous findings. Anterior flowing osteophytes are seen in the midthoracic spine. Review of the MIP images confirms the above findings. IMPRESSION: No central, segmental, or subsegmental pulmonary  embolism. Multifocal patchy airspace opacities seen throughout the left lung, predominantly within the left lower lung likely due to multifocal pneumonia. Aortic Atherosclerosis (ICD10-I70.0). Electronically Signed   By: BPrudencio PairM.D.   On: 03/14/2020 15:03   DG Chest Port 1 View  Result Date: 03/14/2020 CLINICAL DATA:  Fever, hemoptysis EXAM: PORTABLE CHEST 1 VIEW COMPARISON:  04/22/2019 FINDINGS: Stable heart size. Atherosclerotic calcification of the aortic knob. Patchy airspace consolidation within the left mid to lower lung. Mild hazy right basilar opacity, possibly atelectasis. No pleural effusion or pneumothorax. IMPRESSION: Patchy airspace  consolidation within the left mid to lower lung concerning for pneumonia. Radiographic follow-up to resolution is recommended. Electronically Signed   By: Davina Poke D.O.   On: 03/14/2020 10:30    EKG: Independently reviewed.  Normal sinus rhythm with prolonged PR interval  Assessment/Plan Present on Admission: . Sepsis due to pneumonia ([place]) . Chronic diastolic CHF (congestive heart failure) ([place]) . Essential hypertension . Type 2 diabetes mellitus with hyperglycemia ([facility])  Active Problems:   Sepsis due to pneumonia [facility])   Multifocal pneumonia   Type 2 diabetes mellitus with hyperglycemia ([place])   Aortic stenosis   Essential hypertension   Chronic diastolic CHF (congestive heart failure) ([facility])    Sepsis secondary to multifocal pneumonia left lobe.  Admitted with fever, leukocytosis, tachypnea, found to have multifocal pneumonia confirmed on chest x-ray/CT chest.  SPO2 has been around 90%, patient is on O2 but this seems to be more for comfort as he is not truly hypoxic.  Reported history of dysphagia in the past -Azithromycin, ceftriaxone -Discontinue IV Zosyn, doubt aspiration given location, have speech evaluate  -Sputum culture, blood cultures -Incentive spirometry continue to monitor O2 saturations have  Elevated D-dimer. CTA  negative for PE  Type 2 diabetes with peripheral neuropathy -Continue Lantus 15 units daily, sliding scale as needed. -Continue home gabapentin  Hypertension Blood pressure slightly on the lower side (115-128), in the setting of sepsis we will hold off on home lisinopril -Continue to monitor -Hold home lisinopril in setting of sepsis  GERD, stable -continue PPI  HLD, stable -continue Crestor  Chronic CHF with preserved EF.  Last TTE on 02/2019.  No signs of volume overload on exam. -Daily weights -Monitor output -Monitor volume status  Mild aortic stenosis, noted on TTE on 02/2019. Stable, no chest pain     DVT prophylaxis: Heparin  Code Status: DNR, confirmed with patient on day of admission  Family Communication: updated wife on phone Disposition Plan: Admitted as inpatient given patient with sepsis related to multifocal pneumonia, currently requiring IV antibiotics and close monitoring respiratory status and blood pressure.  Consults called: none  Admission status: Admitted as inpatient on med-surge unit.      Desiree Hane MD [facility]  Pager [phone]  If 7PM-7AM, please contact night-coverage [email or web address] Password [facility]  03/14/2020, 8:55 PM

## 2020-03-14 NOTE — ED Triage Notes (Signed)
Pt BIBA from home.   Per EMS-  Pt c/o fever (highest recorded 101.0- EMS); bloody sputum, general weakness since yesterday.   AOx4,   144/56 72 HR 89% RA --> 3L [place]

## 2020-03-14 NOTE — ED Provider Notes (Signed)
[facility] DEPT Provider Note   CSN: [id number] Arrival date & time: 03/14/20  2831     History Chief Complaint  Patient presents with  . Fever    LECIL TAPP is a 86 y.o. male.  The history is provided by the patient, medical records, the EMS personnel and a parent. No language interpreter was used.  Fever  JAVONI LUCKEN is a 86 y.o. male who presents to the Emergency Department complaining of fever, hemoptysis.  Level V caveat due to confusion.  Hx per wife, EMS, and the pt. He presents the emergency department by EMS from home for evaluation of chills, shaking and spitting up blood this morning.  Has been coughing for a few weeks. Vomiting blood was this morning - phlegm and blood (3 episodes), small volume, bright red blood.   His wife states that he has these episodes every so often it is usually due to pneumonia and he needs to spend a few days in the hospital. No reports of black or bloody stools. No nosebleeds. No chest pain, abdominal pain. Symptoms are moderate to severe, constant, worsening. On ED arrival patient denies any current complaints.      Past Medical History:  Diagnosis Date  . Aortic stenosis    Echo 3/18: mild aortic stenosis (mean 12/peak 22 // Echo 3/19: mild AS (mean 12, peak 20) // Echo 02/2019: EF 51-76, grade 1 diastolic dysfunction, mild MAC, mild aortic stenosis (mean gradient 9), mild dilation of the ascending aorta (38 mm)   . Atherosclerosis of aorta ([place]) 12/16/2016   CXR 6/16: IMPRESSION: 1. Resolved left basilar airspace opacities. 2. Hazy nodularity in both lungs compatible with the patient's known pleural plaques. 3. Right anterior hemidiaphragmatic eventration. 4. Thoracic spondylosis. 5. Atherosclerotic calcification of the aortic arch.  . Bradycardia    Low HR (30s) noted during sleep during admx for sepsis in 2016; Mobitz 1, 2:1 block >> no indication for pacer; management of OSA recommended // transient  CHB noted on ECG during admit in 10/2018   . Diabetes mellitus without complication ([place])   . Diabetic neuropathy ([place])   . DJD (degenerative joint disease)    knee, hip  . Echocardiogram    Echo 6/16: mod LVH, EF 65-70, vigorous LVF, no RWMA, mild MR, mild LAE // Echo 3/18: EF 60-65, Gr 1 DD, mild AS, PASP 47 // Echo 3/19: EF 60-65, Gr 1 DD, mild AS (mean 12) // Echo 7/19: EF 55-60, Gr 1 DD, PASP 36  . GERD (gastroesophageal reflux disease)   . Heart murmur   . HLD (hyperlipidemia)   . Hypertension   . Hyperthyroidism   . Lexiscan Myoview 06/2018   Normal perfusion, EF 81; Low Risk  . Mild diastolic dysfunction 16/03/3709   Noted on Echocardiogram; Patient does not require diuretic Rx  . Phlebitis of left leg   . Pneumonia 02/2015   admx with sepsis     Patient Active Problem List   Diagnosis Date Noted  . Aspiration into airway 01/22/2019  . Essential hypertension 11/18/2018  . Dyslipidemia 11/18/2018  . Chronic diastolic CHF (congestive heart failure) ([place]) 11/18/2018  . Status post total replacement of left hip 09/24/2017  . Pain in left hip 08/11/2017  . Unilateral primary osteoarthritis, left hip 08/11/2017  . Bradycardia 12/16/2016  . Atherosclerosis of aorta ([place]) 12/16/2016  . Aortic stenosis   . Severe sepsis ([place]) 02/25/2015  . Bilateral pneumonia 02/25/2015  . Lactic acidosis 02/25/2015  .  Hypoxia 02/25/2015  . Sepsis due to pneumonia ([place]) 02/25/2015  . Acute on chronic respiratory failure with hypoxia ([place])   . CAP (community acquired pneumonia)   . SOB (shortness of breath)   . Type 2 diabetes mellitus with hyperglycemia [facility])     Past Surgical History:  Procedure Laterality Date  . CATARACT EXTRACTION Right   . CIRCUMCISION    . FRACTURE SURGERY    . TOTAL HIP ARTHROPLASTY Left 09/24/2017   Procedure: LEFT TOTAL HIP ARTHROPLASTY ANTERIOR APPROACH;  Surgeon: Mcarthur Rossetti, MD;  Location: [facility] ORS;  Service: Orthopedics;  Laterality: Left;        Family History  Problem Relation Age of Onset  . Diabetes Mother   . Diabetes Sister   . Diabetes Brother   . Heart attack Neg Hx   . Stroke Neg Hx   . Hypertension Neg Hx     Social History   Tobacco Use  . Smoking status: Former Smoker    Quit date: 1960    Years since quitting: 61.5  . Smokeless tobacco: Never Used  Vaping Use  . Vaping Use: Never used  Substance Use Topics  . Alcohol use: No  . Drug use: No    Home Medications Prior to Admission medications   Medication Sig Start Date End Date Taking? Authorizing Provider  aspirin EC 81 MG tablet Take 81 mg by mouth daily.   Yes [provider]  Calcium Carbonate-Vitamin D (CALCIUM 600+D PO) Take 1 tablet by mouth daily.   Yes [provider]  docusate sodium (COLACE) 100 MG capsule Take 100 mg by mouth at bedtime.   Yes [provider]  fluticasone (FLONASE) 50 MCG/ACT nasal spray Place 2 sprays into both nostrils 2 (two) times daily. 11/20/18  Yes Arrien, Jimmy Picket, MD  gabapentin (NEURONTIN) 100 MG capsule Take 300 mg by mouth at bedtime.    Yes [provider]  insulin aspart (NOVOLOG FLEXPEN) 100 UNIT/ML FlexPen Before each meal 3 times a day, 140-199 - 2 units, 200-250 - 4 units, 251-299 - 6 units,  300-349 - 8 units,  350 or above 10 units. 02/28/15  Yes Florencia Reasons, MD  Insulin Glargine (LANTUS) 100 UNIT/ML Solostar Pen Inject 15 Units into the skin daily at 10 pm.   Yes [provider]  lisinopril (PRINIVIL,ZESTRIL) 5 MG tablet Take 1 tablet (5 mg total) by mouth daily. 12/26/18  Yes Patwardhan, Manish J, MD  Multiple Vitamins-Minerals (MULTIVITAMIN PO) Take 1 tablet by mouth daily. Centrum Silver   Yes [provider]  omeprazole (PRILOSEC) 20 MG capsule Take 20 mg by mouth daily.  10/20/16  Yes [provider]  rosuvastatin (CRESTOR) 10 MG tablet TAKE 1 TABLET AT BEDTIME Patient taking differently: Take 10 mg by mouth at bedtime.  09/29/19  Yes  Adrian Prows, MD  sodium chloride (OCEAN) 0.65 % SOLN nasal spray Place 1 spray into both nostrils as needed for congestion. 11/20/18  Yes Arrien, Jimmy Picket, MD  tetrahydrozoline-zinc (VISINE-AC) 0.05-0.25 % ophthalmic solution Place 2 drops into both eyes 3 (three) times daily as needed (dry eyes).   Yes [provider]  ACCU-CHEK AVIVA PLUS test strip  07/30/17   [provider]    Allergies    Actos [pioglitazone], Formaldehyde, Lipitor [atorvastatin calcium], and Metformin and related  Review of Systems   Review of Systems  Constitutional: Positive for fever.  All other systems reviewed and are negative.   Physical Exam Updated Vital Signs  BP (!) 115/55   Pulse 76   Temp (!) 102.9 F (39.4 C) (Rectal)   Resp (!) 22   SpO2 95%   Physical Exam Vitals and nursing note reviewed.  Constitutional:      Appearance: He is well-developed.  HENT:     Head: Normocephalic and atraumatic.     Comments: No blood in the posterior oropharynx. No blood in the nares bilaterally. Cardiovascular:     Rate and Rhythm: Normal rate and regular rhythm.     Heart sounds: Murmur heard.   Pulmonary:     Effort: Pulmonary effort is normal. No respiratory distress.     Comments: Faint rhonchi bilaterally, left greater than right Abdominal:     Palpations: Abdomen is soft.     Tenderness: There is no abdominal tenderness. There is no guarding or rebound.     Comments: There are two patches of erythema to the right flank without any local tenderness, fluctuance.  Musculoskeletal:        General: No swelling or tenderness.  Skin:    General: Skin is warm and dry.  Neurological:     Mental Status: He is alert.     Comments: Mildly confused. Oriented to hospital. Does not answer when asked time orientation. Mildly dysarthria speech.  Psychiatric:        Behavior: Behavior normal.     ED Results / Procedures / Treatments   Labs (all labs ordered are listed, but only  abnormal results are displayed) Labs Reviewed  COMPREHENSIVE METABOLIC PANEL - Abnormal; Notable for the following components:      Result Value   Glucose, Bld 192 (*)    All other components within normal limits  CBC WITH DIFFERENTIAL/PLATELET - Abnormal; Notable for the following components:   WBC 12.7 (*)    Neutro Abs 11.0 (*)    Lymphs Abs 0.5 (*)    All other components within normal limits  D-DIMER, QUANTITATIVE (NOT AT [facility]) - Abnormal; Notable for the following components:   D-Dimer, Quant 2.51 (*)    All other components within normal limits  SARS CORONAVIRUS 2 BY RT PCR (HOSPITAL ORDER, [place] [facility])  CULTURE, BLOOD (ROUTINE X 2)  CULTURE, BLOOD (ROUTINE X 2)  URINE CULTURE  LACTIC ACID, PLASMA  APTT  PROTIME-INR  URINALYSIS, ROUTINE W REFLEX MICROSCOPIC    EKG None  Radiology DG Chest Port 1 View  Result Date: 03/14/2020 CLINICAL DATA:  Fever, hemoptysis EXAM: PORTABLE CHEST 1 VIEW COMPARISON:  04/22/2019 FINDINGS: Stable heart size. Atherosclerotic calcification of the aortic knob. Patchy airspace consolidation within the left mid to lower lung. Mild hazy right basilar opacity, possibly atelectasis. No pleural effusion or pneumothorax. IMPRESSION: Patchy airspace consolidation within the left mid to lower lung concerning for pneumonia. Radiographic follow-up to resolution is recommended. Electronically Signed   By: Davina Poke D.O.   On: 03/14/2020 10:30    Procedures Procedures (including critical care time)  Medications Ordered in ED Medications  iohexol (OMNIPAQUE) 350 MG/ML injection 100 mL (has no administration in time range)  acetaminophen (TYLENOL) tablet 650 mg (650 mg Oral Given 03/14/20 1017)  piperacillin-tazobactam (ZOSYN) IVPB 3.375 g (0 g Intravenous Stopped 03/14/20 1142)    ED Course  I have reviewed the triage vital signs and the nursing notes.  Pertinent labs & imaging results that were available during my care  of the patient were reviewed by me and considered in my medical decision making (see chart for details).  MDM Rules/Calculators/A&P                         Patient here for evaluation of chills, hemoptysis that started this morning. He is mildly confused but non-toxic appearing on evaluation. Chest x-rays concerning for pneumonia, treated with IV antibiotics for possible aspiration pneumonia based on his history. Given report of hemoptysis a D dimer was obtained, which is elevated. Will check CTA to rule out PE. Discussed with patient and wife findings of studies recommendation for admission for further treatment and they are in agreement with plan. Hospitalist consulted for admission.  Final Clinical Impression(s) / ED Diagnoses Final diagnoses:  None    Rx / DC Orders ED Discharge Orders    None       Quintella Reichert, MD 03/14/20 1350

## 2020-03-14 NOTE — ED Notes (Signed)
Jackson [name], wife would like to speak with the nurse, [phone].

## 2020-03-15 DIAGNOSIS — I10 Essential (primary) hypertension: Secondary | ICD-10-CM

## 2020-03-15 DIAGNOSIS — E1165 Type 2 diabetes mellitus with hyperglycemia: Secondary | ICD-10-CM

## 2020-03-15 DIAGNOSIS — Z794 Long term (current) use of insulin: Secondary | ICD-10-CM

## 2020-03-15 DIAGNOSIS — I5032 Chronic diastolic (congestive) heart failure: Secondary | ICD-10-CM

## 2020-03-15 DIAGNOSIS — J189 Pneumonia, unspecified organism: Secondary | ICD-10-CM

## 2020-03-15 LAB — BASIC METABOLIC PANEL
Anion gap: 8 (ref 5–15)
BUN: 17 mg/dL (ref 8–23)
CO2: 29 mmol/L (ref 22–32)
Calcium: 9.2 mg/dL (ref 8.9–10.3)
Chloride: 103 mmol/L (ref 98–111)
Creatinine, Ser: 1.19 mg/dL (ref 0.61–1.24)
GFR calc Af Amer: >60 mL/min (ref >60)
GFR calc non Af Amer: 54 mL/min — ABNORMAL LOW (ref >60)
Glucose, Bld: 112 mg/dL — ABNORMAL HIGH (ref 70–99)
Potassium: 4.2 mmol/L (ref 3.5–5.1)
Sodium: 140 mmol/L (ref 135–145)

## 2020-03-15 LAB — CBC
HCT: 37.7 % — ABNORMAL LOW (ref 39.0–52.0)
Hemoglobin: 12.4 g/dL — ABNORMAL LOW (ref 13.0–17.0)
MCH: 31.4 pg (ref 26.0–34.0)
MCHC: 32.9 g/dL (ref 30.0–36.0)
MCV: 95.4 fL (ref 80.0–100.0)
Platelets: 187 10*3/uL (ref 150–400)
RBC: 3.95 MIL/uL — ABNORMAL LOW (ref 4.22–5.81)
RDW: 13 % (ref 11.5–15.5)
WBC: 8.7 10*3/uL (ref 4.0–10.5)
nRBC: 0 % (ref 0.0–0.2)

## 2020-03-15 LAB — GLUCOSE, CAPILLARY
Glucose-Capillary: 114 mg/dL — ABNORMAL HIGH (ref 70–99)
Glucose-Capillary: 188 mg/dL — ABNORMAL HIGH (ref 70–99)
Glucose-Capillary: 245 mg/dL — ABNORMAL HIGH (ref 70–99)

## 2020-03-15 MED ORDER — FAMOTIDINE 20 MG PO TABS
20.0000 mg | ORAL_TABLET | Freq: Every day | ORAL | Status: DC
  Administered 2020-03-15: 20 mg via ORAL
  Filled 2020-03-15: qty 1

## 2020-03-15 NOTE — Evaluation (Addendum)
Clinical/Bedside Swallow Evaluation Patient Details  Name: Jackson [name] MRN: [id number] Date of Birth: April 01, 1929  Today's Date: 03/15/2020 Time: SLP Start Time (ACUTE ONLY): 0903 SLP Stop Time (ACUTE ONLY): 0936 SLP Time Calculation (min) (ACUTE ONLY): 33 min  Past Medical History:  Past Medical History:  Diagnosis Date  . Aortic stenosis    Echo 3/18: mild aortic stenosis (mean 12/peak 22 // Echo 3/19: mild AS (mean 12, peak 20) // Echo 02/2019: EF 40-97, grade 1 diastolic dysfunction, mild MAC, mild aortic stenosis (mean gradient 9), mild dilation of the ascending aorta (38 mm)   . Atherosclerosis of aorta ([place]) 12/16/2016   CXR 6/16: IMPRESSION: 1. Resolved left basilar airspace opacities. 2. Hazy nodularity in both lungs compatible with the patient's known pleural plaques. 3. Right anterior hemidiaphragmatic eventration. 4. Thoracic spondylosis. 5. Atherosclerotic calcification of the aortic arch.  . Bradycardia    Low HR (30s) noted during sleep during admx for sepsis in 2016; Mobitz 1, 2:1 block >> no indication for pacer; management of OSA recommended // transient CHB noted on ECG during admit in 10/2018   . Diabetes mellitus without complication ([place])   . Diabetic neuropathy ([place])   . DJD (degenerative joint disease)    knee, hip  . Echocardiogram    Echo 6/16: mod LVH, EF 65-70, vigorous LVF, no RWMA, mild MR, mild LAE // Echo 3/18: EF 60-65, Gr 1 DD, mild AS, PASP 47 // Echo 3/19: EF 60-65, Gr 1 DD, mild AS (mean 12) // Echo 7/19: EF 55-60, Gr 1 DD, PASP 36  . GERD (gastroesophageal reflux disease)   . Heart murmur   . HLD (hyperlipidemia)   . Hypertension   . Hyperthyroidism   . Lexiscan Myoview 06/2018   Normal perfusion, EF 81; Low Risk  . Mild diastolic dysfunction 35/32/9924   Noted on Echocardiogram; Patient does not require diuretic Rx  . Phlebitis of left leg   . Pneumonia 02/2015   admx with sepsis    Past Surgical History:  Past Surgical History:  Procedure  Laterality Date  . CATARACT EXTRACTION Right   . CIRCUMCISION    . FRACTURE SURGERY    . TOTAL HIP ARTHROPLASTY Left 09/24/2017   Procedure: LEFT TOTAL HIP ARTHROPLASTY ANTERIOR APPROACH;  Surgeon: Mcarthur Rossetti, MD;  Location: [facility];  Service: Orthopedics;  Laterality: Left;   HPI:  Jackson [name] is a 86 y.o. male with medical history significant for HTN, diabetes, CHF with preserved EF, hypothyroidism, HLD, history of dysphagia and recurrent pneumonia who presented on 03/14/2020 with worsening shortness of breath, cough and chills .  CTA chest showed multifocal patchy airspace opacities in left lung concerning for multifocal pneumonia. Patient states several hours after eating (around 2:00 am) he felt significant chills with weakness and decreased ability to stand on his own. Per wife he had coughed up some blood around 7: 30 am. She states he has been to the hospital approximately 4 times for pneumonia for the same lung (his last hospital stay was about a year ago). He has been repeatedly seen by SLP with finding of normal oropharyngeal function, but concern for a primary esophageal issue/postprandial aspriation/GERD. Pt is reportedly aware of appropriate diet and precautions, but at times knowingly eats foods that trigger reflux because he has been tolerating well. For example on 01/2019 documentation states "pt is a 86 yo male adm to [facility] with respiratory deficits after waking up coughing causing vomiting.  He reports he consumed  jalapeno  poppers at 6 pm, followed by nuts and a chocolate bar later in the evening and he questions this causing reflux."   Assessment / Plan / Recommendation Clinical Impression  As seen in prior visits, pt demonstrates no evidence of an oropharyngeal dysphagia. He does endorse being non-compliant with his reflux precautions. He states he has been eating inappropriate foods at 8 or 9 pm. For example, the night prior to admit he ate two SMALL pieces of pizza. SLP  reiterated precautions that he has heard many times and emphasized three major areas to address: 1. stop eating triggering foods in the pm  2. consider a bed that raises up mechanically to at least 30 degrees (pt has bed on a slight incline)  3.  Discuss reflux meds with MD. Pt states he takes prilosec once a day at night time and was not aware Prilosec is intended to be take before eating. Many pts with severe night time reflux benefit from a BID meds or a combination of meds. Emphasized that this is a discussion to be had with his MD.  Pt does not demonstrate understanding as he persists in off topic conversation and does not confirm understanding after multiple repetitions. Provided written education left in room, wife called with no answer, no other family listed in chart. No SLP f/u needed at this time. Pt may resume a regular diet. Will sign off.  SLP Visit Diagnosis: Dysphagia, unspecified (R13.10)    Aspiration Risk  Severe aspiration risk    Diet Recommendation     Medication Administration: Whole meds with liquid Supervision: Patient able to self feed Postural Changes: Seated upright at 90 degrees;Remain upright for at least 30 minutes after po intake    Other  Recommendations Recommended Consults: Consider GI evaluation Oral Care Recommendations: Oral care BID   Follow up Recommendations None      Frequency and Duration            Prognosis        Swallow Study   General HPI: Jackson [name] is a 86 y.o. male with medical history significant for HTN, diabetes, CHF with preserved EF, hypothyroidism, HLD, history of dysphagia and recurrent pneumonia who presented on 03/14/2020 with worsening shortness of breath, cough and chills .  CTA chest showed multifocal patchy airspace opacities in left lung concerning for multifocal pneumonia. Patient states several hours after eating (around 2:00 am) he felt significant chills with weakness and decreased ability to stand on his own. Per  wife he had coughed up some blood around 7: 30 am. She states he has been to the hospital approximately 4 times for pneumonia for the same lung (his last hospital stay was about a year ago). He has been repeatedly seen by SLP with finding of normal oropharyngeal function, but concern for a primary esophageal issue/postprandial aspriation/GERD. Pt is reportedly aware of appropriate diet and precautions, but at times knowingly eats foods that trigger reflux because he has been tolerating well. For example on 01/2019 documentation states "pt is a 86 yo male adm to [facility] with respiratory deficits after waking up coughing causing vomiting.  He reports he consumed  jalapeno poppers at 6 pm, followed by nuts and a chocolate bar later in the evening and he questions this causing reflux." Type of Study: Bedside Swallow Evaluation Previous Swallow Assessment: see HPI Diet Prior to this Study: Dysphagia 3 (soft);Thin liquids Temperature Spikes Noted: No Respiratory Status: Room air History of Recent Intubation: No Behavior/Cognition: Alert;Cooperative;Pleasant mood Oral  Cavity Assessment: Within Functional Limits Oral Care Completed by SLP: No Oral Cavity - Dentition: Dentures, top Vision: Functional for self-feeding Self-Feeding Abilities: Able to feed self Patient Positioning: Upright in bed Baseline Vocal Quality: Normal Volitional Cough: Strong Volitional Swallow: Able to elicit    Oral/Motor/Sensory Function Overall Oral Motor/Sensory Function: Within functional limits   Ice Chips     Thin Liquid Thin Liquid: Within functional limits    Nectar Thick Nectar Thick Liquid: Not tested   Honey Thick Honey Thick Liquid: Not tested   Puree Puree: Not tested   Solid     Solid: Within functional limits     Herbie Baltimore, MA CCC-SLP  Acute Rehabilitation Services Pager [phone] Office [phone]  Lynann Beaver 03/15/2020,9:45 AM

## 2020-03-15 NOTE — Progress Notes (Signed)
[facility] PROGRESS NOTE   EILEEN CROSWELL IHW:[id number] DOB: 08/22/1929 DOA: 03/14/2020  PCP: Marden Noble, MD  Brief History/Interval Summary: 86 y.o. male with medical history significant for HTN, diabetes, CHF with preserved EF, hypothyroidism, HLD, history of dysphagia and recurrent pneumonia who presented on 03/14/2020 with worsening shortness of breath, cough and chills .  Patient stated that several hours after eating (around 2:00 this morning) he felt significant chills with weakness and decreased ability to stand on his own.  His wife noted how poorly he left immediately called EMS.  Patient does not remember coughing or having episodes of emesis or bloody sputum.  Patient was found to have patchy airspace disease in the left lung.  CT scan was negative for PE.  Patient was hospitalized for further management.  Reason for Visit: Community-acquired pneumonia  Consultants: None  Procedures: None  Antibiotics: Anti-infectives (From admission, onward)   Start     Dose/Rate Route Frequency Ordered Stop   03/14/20 1430  cefTRIAXone (ROCEPHIN) 1 g in sodium chloride 0.9 % 100 mL IVPB     Discontinue     1 g 200 mL/hr over 30 Minutes Intravenous Every 24 hours 03/14/20 1409     03/14/20 1430  azithromycin (ZITHROMAX) 500 mg in sodium chloride 0.9 % 250 mL IVPB     Discontinue     500 mg 250 mL/hr over 60 Minutes Intravenous Every 24 hours 03/14/20 1409     03/14/20 1030  piperacillin-tazobactam (ZOSYN) IVPB 3.375 g        3.375 g 100 mL/hr over 30 Minutes Intravenous  Once 03/14/20 1018 03/14/20 1142      Subjective/Interval History: Patient mentions that he feels well this morning.  Has occasional cough with clear expectoration.  No wheezing.  Denies any chest pain.  No shortness of breath at rest.  ROS: Denies any nausea or vomiting    Assessment/Plan:  Community-acquired pneumonia involving left lung with sepsis present on admission Sepsis physiology appears to  have improved.  Patient is currently saturating normal on room air.  Apparently there is a history of dysphagia the patient denies any difficulty with swallowing.  Speech therapy was consulted.  Dysphagia 3 diet.  Some concern for nighttime acid reflux.  Will initiate Pepcid at bedtime.  Follow-up on cultures.  Patient remains on ceftriaxone and azithromycin.  Mobilize.  PT evaluation.  CT angiogram was negative for PE.  Diabetes mellitus type 2, with peripheral neuropathy CBGs are reasonably well controlled.  Continue Lantus and SSI.  Continue gabapentin.  No recent HbA1c.  Essential hypertension Blood pressure was noted to be on the lower side yesterday.  Lisinopril on hold.  Blood pressure is reasonably well controlled.  Continue to monitor.  History of GERD Noted to be on PPI.  Add nighttime Pepcid.    Hyperlipidemia Continue Crestor  Chronic diastolic CHF Euvolemic currently.  Last echocardiogram in June 2020 showed normal systolic function.  Mild aortic stenosis Stable.   DVT Prophylaxis: Subcutaneous heparin Code Status: DNR Family Communication: No family at bedside.  Will call his wife later today. Disposition Plan:  Status is: Inpatient  Remains inpatient appropriate because:IV treatments appropriate due to intensity of illness or inability to take PO   Dispo: The patient is from: Home              Anticipated d/c is to: Home              Anticipated d/c date is: 2 days  Patient currently is not medically stable to d/c.     Medications:  Scheduled: . aspirin EC  81 mg Oral Daily  . docusate sodium  100 mg Oral QHS  . fluticasone  2 spray Each Nare BID  . heparin  5,000 Units Subcutaneous Q8H  . insulin aspart  0-15 Units Subcutaneous TID WC  . insulin glargine  15 Units Subcutaneous Q2200  . pantoprazole  40 mg Oral Daily  . rosuvastatin  10 mg Oral QHS   Continuous: . azithromycin Stopped (03/14/20 1749)  . cefTRIAXone (ROCEPHIN)  IV Stopped  (03/14/20 1748)   QMV:HQIONGEXBMWUX **OR** acetaminophen, ondansetron **OR** ondansetron (ZOFRAN) IV, sodium chloride   Objective:  Vital Signs  Vitals:   03/14/20 1833 03/14/20 2223 03/14/20 2230 03/15/20 0624  BP:  (!) 136/48  (!) 130/48  Pulse:  (!) 126 62 63  Resp:  17    Temp:  99.5 F (37.5 C)  98.3 F (36.8 C)  TempSrc:  Oral  Oral  SpO2:  91%  95%  Weight: 80.6 kg       Intake/Output Summary (Last 24 hours) at 03/15/2020 1119 Last data filed at 03/15/2020 0626 Gross per 24 hour  Intake 400 ml  Output 630 ml  Net -230 ml   Filed Weights   03/14/20 1833  Weight: 80.6 kg    General appearance: Awake alert.  In no distress Resp: Normal effort at rest.  Crackles heard left lung.  No wheezing or rhonchi.   Cardio: S1-S2 is normal regular.  No S3-S4.  No rubs murmurs or bruit GI: Abdomen is soft.  Nontender nondistended.  Bowel sounds are present normal.  No masses organomegaly Extremities: No edema.  Full range of motion of lower extremities. Neurologic: Alert and oriented x3.  No focal neurological deficits.    Lab Results:  Data Reviewed: I have personally reviewed following labs and imaging studies  CBC: Recent Labs  Lab 03/14/20 1021 03/15/20 0538  WBC 12.7* 8.7  NEUTROABS 11.0*  --   HGB 13.9 12.4*  HCT 41.5 37.7*  MCV 95.2 95.4  PLT 209 187    Basic Metabolic Panel: Recent Labs  Lab 03/14/20 1021 03/15/20 0538  NA 138 140  K 3.9 4.2  CL 101 103  CO2 28 29  GLUCOSE 192* 112*  BUN 19 17  CREATININE 1.06 1.19  CALCIUM 9.0 9.2    GFR: CrCl cannot be calculated (Unknown ideal weight.).  Liver Function Tests: Recent Labs  Lab 03/14/20 1021  AST 17  ALT 20  ALKPHOS 60  BILITOT 0.7  PROT 6.6  ALBUMIN 3.7    Coagulation Profile: Recent Labs  Lab 03/14/20 1021  INR 1.1    CBG: Recent Labs  Lab 03/14/20 2142 03/15/20 0739  GLUCAP 175* 114*     Recent Results (from the past 240 hour(s))  SARS Coronavirus 2 by RT PCR  (hospital order, performed in [facility] hospital lab) Nasopharyngeal Nasopharyngeal Swab     Status: None   Collection Time: 03/14/20 10:21 AM   Specimen: Nasopharyngeal Swab  Result Value Ref Range Status   SARS Coronavirus 2 NEGATIVE NEGATIVE Final    Comment: (NOTE) SARS-CoV-2 target nucleic acids are NOT DETECTED.  The SARS-CoV-2 RNA is generally detectable in upper and lower respiratory specimens during the acute phase of infection. The lowest concentration of SARS-CoV-2 viral copies this assay can detect is 250 copies / mL. A negative result does not preclude SARS-CoV-2 infection and should not be used as  the sole basis for treatment or other patient management decisions.  A negative result may occur with improper specimen collection / handling, submission of specimen other than nasopharyngeal swab, presence of viral mutation(s) within the areas targeted by this assay, and inadequate number of viral copies (<250 copies / mL). A negative result must be combined with clinical observations, patient history, and epidemiological information.  Fact Sheet for Patients:   StrictlyIdeas.no  Fact Sheet for Healthcare Providers: BankingDealers.co.za  This test is not yet approved or  cleared by the [place] FDA and has been authorized for detection and/or diagnosis of SARS-CoV-2 by FDA under an Emergency Use Authorization (EUA).  This EUA will remain in effect (meaning this test can be used) for the duration of the COVID-19 declaration under Section 564(b)(1) of the Act, 21 U.S.C. section 360bbb-3(b)(1), unless the authorization is terminated or revoked sooner.  Performed at [facility], [place] [address], [place], [place] [address]       Radiology Studies: CT Angio Chest PE W/Cm &/Or Wo Cm  Result Date: 03/14/2020 CLINICAL DATA:  Shortness of breath hemoptysis EXAM: CT ANGIOGRAPHY CHEST WITH CONTRAST  TECHNIQUE: Multidetector CT imaging of the chest was performed using the standard protocol during bolus administration of intravenous contrast. Multiplanar CT image reconstructions and MIPs were obtained to evaluate the vascular anatomy. CONTRAST:  24mL OMNIPAQUE IOHEXOL 350 MG/ML SOLN COMPARISON:  March 19, 2008 FINDINGS: Cardiovascular: There is a optimal opacification of the pulmonary arteries. There is no central,segmental, or subsegmental filling defects within the pulmonary arteries. The heart is normal in size. No pericardial effusion or thickening. No evidence right heart strain. There is normal three-vessel brachiocephalic anatomy without proximal stenosis. Coronary artery calcifications are seen. Scattered aortic atherosclerosis is noted. Mediastinum/Nodes: No hilar, mediastinal, or axillary adenopathy. Thyroid gland, trachea, and esophagus demonstrate no significant findings. Lungs/Pleura: Again noted are scattered bilateral pleural plaques as on the prior exam, some of which are mildly calcified. There is multifocal patchy tree-in-bud and ground-glass opacities seen throughout the left lung, predominantly within the left lobe. No pleural effusion is seen. Upper Abdomen: No acute abnormalities present in the visualized portions of the upper abdomen. Musculoskeletal: No chest wall abnormality. No acute or significant osseous findings. Anterior flowing osteophytes are seen in the midthoracic spine. Review of the MIP images confirms the above findings. IMPRESSION: No central, segmental, or subsegmental pulmonary embolism. Multifocal patchy airspace opacities seen throughout the left lung, predominantly within the left lower lung likely due to multifocal pneumonia. Aortic Atherosclerosis (ICD10-I70.0). Electronically Signed   By: Prudencio Pair M.D.   On: 03/14/2020 15:03   DG Chest Port 1 View  Result Date: 03/14/2020 CLINICAL DATA:  Fever, hemoptysis EXAM: PORTABLE CHEST 1 VIEW COMPARISON:  04/22/2019  FINDINGS: Stable heart size. Atherosclerotic calcification of the aortic knob. Patchy airspace consolidation within the left mid to lower lung. Mild hazy right basilar opacity, possibly atelectasis. No pleural effusion or pneumothorax. IMPRESSION: Patchy airspace consolidation within the left mid to lower lung concerning for pneumonia. Radiographic follow-up to resolution is recommended. Electronically Signed   By: Davina Poke D.O.   On: 03/14/2020 10:30       LOS: 1 day   [facility] Pager on [email or web address]  03/15/2020, 11:19 AM

## 2020-03-15 NOTE — Progress Notes (Addendum)
Pt's pulse rechecked by this RN. Rate ranging from 56-62. Pt alert and appropriate. Denies any complaints at present.

## 2020-03-16 LAB — URINE CULTURE: Culture: NO GROWTH

## 2020-03-16 LAB — CBC
HCT: 38.6 % — ABNORMAL LOW (ref 39.0–52.0)
Hemoglobin: 12.5 g/dL — ABNORMAL LOW (ref 13.0–17.0)
MCH: 31.2 pg (ref 26.0–34.0)
MCHC: 32.4 g/dL (ref 30.0–36.0)
MCV: 96.3 fL (ref 80.0–100.0)
Platelets: 175 10*3/uL (ref 150–400)
RBC: 4.01 MIL/uL — ABNORMAL LOW (ref 4.22–5.81)
RDW: 12.8 % (ref 11.5–15.5)
WBC: 6.1 10*3/uL (ref 4.0–10.5)
nRBC: 0 % (ref 0.0–0.2)

## 2020-03-16 LAB — GLUCOSE, CAPILLARY
Glucose-Capillary: 166 mg/dL — ABNORMAL HIGH (ref 70–99)
Glucose-Capillary: 206 mg/dL — ABNORMAL HIGH (ref 70–99)
Glucose-Capillary: 269 mg/dL — ABNORMAL HIGH (ref 70–99)
Glucose-Capillary: 171 mg/dL — ABNORMAL HIGH (ref 70–99)
Glucose-Capillary: 218 mg/dL — ABNORMAL HIGH (ref 70–99)
Glucose-Capillary: 257 mg/dL — ABNORMAL HIGH (ref 70–99)

## 2020-03-16 LAB — BASIC METABOLIC PANEL
Anion gap: 11 (ref 5–15)
BUN: 16 mg/dL (ref 8–23)
CO2: 27 mmol/L (ref 22–32)
Calcium: 9.3 mg/dL (ref 8.9–10.3)
Chloride: 101 mmol/L (ref 98–111)
Creatinine, Ser: 1.03 mg/dL (ref 0.61–1.24)
GFR calc Af Amer: >60 mL/min (ref >60)
GFR calc non Af Amer: >60 mL/min (ref >60)
Glucose, Bld: 168 mg/dL — ABNORMAL HIGH (ref 70–99)
Potassium: 3.8 mmol/L (ref 3.5–5.1)
Sodium: 139 mmol/L (ref 135–145)

## 2020-03-16 MED ORDER — AZITHROMYCIN 500 MG PO TABS: 500.0000 mg | ORAL_TABLET | Freq: Every day | ORAL | 0 refills | Status: AC

## 2020-03-16 MED ORDER — FAMOTIDINE 20 MG PO TABS: 20.0000 mg | ORAL_TABLET | Freq: Every day | ORAL | 1 refills | Status: DC

## 2020-03-16 MED ORDER — CEFDINIR 300 MG PO CAPS
300.0000 mg | ORAL_CAPSULE | Freq: Two times a day (BID) | ORAL | Status: DC
  Administered 2020-03-16: 300 mg via ORAL
  Filled 2020-03-16: qty 1

## 2020-03-16 MED ORDER — CEFDINIR 300 MG PO CAPS: 300.0000 mg | ORAL_CAPSULE | Freq: Two times a day (BID) | ORAL | 0 refills | Status: AC

## 2020-03-16 MED ORDER — AZITHROMYCIN 250 MG PO TABS
500.0000 mg | ORAL_TABLET | Freq: Every day | ORAL | Status: DC
  Administered 2020-03-16: 500 mg via ORAL
  Filled 2020-03-16: qty 2

## 2020-03-16 NOTE — Progress Notes (Signed)
Patient discharged home with son in stable condition. Discharge instructions given. Script sent to pharmacy of choice. No immediate questions or concerns at this time. Pt discharged from unit via wheelchair.

## 2020-03-16 NOTE — Discharge Instructions (Signed)
Community-Acquired Pneumonia, Adult Pneumonia is an infection of the lungs. It causes swelling in the airways of the lungs. Mucus and fluid may also build up inside the airways. One type of pneumonia can happen while a person is in a hospital. A different type can happen when a person is not in a hospital (community-acquired pneumonia).  What are the causes?  This condition is caused by germs (viruses, bacteria, or fungi). Some types of germs can be passed from one person to another. This can happen when you breathe in droplets from the cough or sneeze of an infected person. What increases the risk? You are more likely to develop this condition if you:  Have a long-term (chronic) disease, such as: ? Chronic obstructive pulmonary disease (COPD). ? Asthma. ? Cystic fibrosis. ? Congestive heart failure. ? Diabetes. ? Kidney disease.  Have HIV.  Have sickle cell disease.  Have had your spleen removed.  Do not take good care of your teeth and mouth (poor dental hygiene).  Have a medical condition that increases the risk of breathing in droplets from your own mouth and nose.  Have a weakened body defense system (immune system).  Are a smoker.  Travel to areas where the germs that cause this illness are common.  Are around certain animals or the places they live. What are the signs or symptoms?  A dry cough.  A wet (productive) cough.  Fever.  Sweating.  Chest pain. This often happens when breathing deeply or coughing.  Fast breathing or trouble breathing.  Shortness of breath.  Shaking chills.  Feeling tired (fatigue).  Muscle aches. How is this treated? Treatment for this condition depends on many things. Most adults can be treated at home. In some cases, treatment must happen in a hospital. Treatment may include:  Medicines given by mouth or through an IV tube.  Being given extra oxygen.  Respiratory therapy. In rare cases, treatment for very bad pneumonia  may include:  Using a machine to help you breathe.  Having a procedure to remove fluid from around your lungs. Follow these instructions at home: Medicines  Take over-the-counter and prescription medicines only as told by your doctor. ? Only take cough medicine if you are losing sleep.  If you were prescribed an antibiotic medicine, take it as told by your doctor. Do not stop taking the antibiotic even if you start to feel better. General instructions   Sleep with your head and neck raised (elevated). You can do this by sleeping in a recliner or by putting a few pillows under your head.  Rest as needed. Get at least 8 hours of sleep each night.  Drink enough water to keep your pee (urine) pale yellow.  Eat a healthy diet that includes plenty of vegetables, fruits, whole grains, low-fat dairy products, and lean protein.  Do not use any products that contain nicotine or tobacco. These include cigarettes, e-cigarettes, and chewing tobacco. If you need help quitting, ask your doctor.  Keep all follow-up visits as told by your doctor. This is important. How is this prevented? A shot (vaccine) can help prevent pneumonia. Shots are often suggested for:  People older than 84 years of age.  People older than 84 years of age who: ? Are having cancer treatment. ? Have long-term (chronic) lung disease. ? Have problems with their body's defense system. You may also prevent pneumonia if you take these actions:  Get the flu (influenza) shot every year.  Go to the dentist as   often as told.  Wash your hands often. If you cannot use soap and water, use hand sanitizer. Contact a doctor if:  You have a fever.  You lose sleep because your cough medicine does not help. Get help right away if:  You are short of breath and it gets worse.  You have more chest pain.  Your sickness gets worse. This is very serious if: ? You are an older adult. ? Your body's defense system is weak.  You  cough up blood. Summary  Pneumonia is an infection of the lungs.  Most adults can be treated at home. Some will need treatment in a hospital.  Drink enough water to keep your pee pale yellow.  Get at least 8 hours of sleep each night. This information is not intended to replace advice given to you by your health care provider. Make sure you discuss any questions you have with your health care provider. Document Revised: 12/28/2018 Document Reviewed: 05/05/2018 [employer] Patient Education  2020 [employer]  

## 2020-03-16 NOTE — Discharge Summary (Signed)
[facility]  Physician Discharge Summary   Patient ID: Jackson [name] MRN: [id number] DOB/AGE: [birth year] 86 y.o.  Admit date: 03/14/2020 Discharge date: 03/16/2020  PCP: Marden Noble, MD  DISCHARGE DIAGNOSES:  Community-acquired pneumonia involving left lung Sepsis present on admission Diabetes mellitus type 2, with peripheral neuropathy Essential hypertension GERD Hyperlipidemia Chronic diastolic CHF Mild aortic stenosis   RECOMMENDATIONS FOR OUTPATIENT FOLLOW UP: 1. Follow up with PCP    Home Health:None  Equipment/Devices:None   CODE STATUS:DNR   DISCHARGE CONDITION: fair  Diet recommendation: Mod Carb  INITIAL HISTORY: 86 y.o.malewith medical history significant forHTN, diabetes, CHF with preserved EF, hypothyroidism, HLD, history of dysphagia and recurrent pneumonia who presented on 03/14/2020 with worsening shortness of breath, cough and chills.Patient stated that several hours after eating (around 2:00 this morning) he felt significant chills with weakness and decreased ability to stand on his own. His wife noted how poorly he left immediately called EMS. Patient does not remember coughing or having episodes of emesis or bloody sputum.  Patient was found to have patchy airspace disease in the left lung.  CT scan was negative for PE.  Patient was hospitalized for further management.   HOSPITAL COURSE:   Community-acquired pneumonia involving left lung with sepsis present on admission Sepsis physiology appears to have improved.  Patient is currently saturating normal on room air.  Apparently there is a history of dysphagia the patient denies any difficulty with swallowing.  Speech therapy was consulted. Some concern for nighttime acid reflux.  Will initiate Pepcid at bedtime.  Cultures negative so far. Patient was started on ceftriaxone and azithromycin.  CT angiogram was negative for PE. Feels much better today. Not requiring O2. Change to [facility]  and oral Azithromycin. Did well with PT evaluation.  Diabetes mellitus type 2, with peripheral neuropathy Continue Lantus and SSI.  Continue gabapentin.    Essential hypertension Blood pressure was noted to be on the lower side at admission.  Lisinopril was held. BP has improved. Can resume.   History of GERD Noted to be on PPI.  Add nighttime Pepcid.    Hyperlipidemia Continue Crestor  Chronic diastolic CHF Euvolemic currently.  Last echocardiogram in June 2020 showed normal systolic function.  Mild aortic stenosis Stable.  Overall stable. Wants to go home. Ok for discharge.   PERTINENT LABS:  The results of significant diagnostics from this hospitalization (including imaging, microbiology, ancillary and laboratory) are listed below for reference.    Microbiology: Recent Results (from the past 240 hour(s))  Blood Culture (routine x 2)     Status: None (Preliminary result)   Collection Time: 03/14/20 10:21 AM   Specimen: BLOOD  Result Value Ref Range Status   Specimen Description   Final    BLOOD LEFT ANTECUBITAL Performed at [facility], [address], [place], [place] [address]    Special Requests   Final    BOTTLES DRAWN AEROBIC AND ANAEROBIC Blood Culture adequate volume Performed at [facility], [address] [place], [place] [address]    Culture   Final    NO GROWTH 2 DAYS Performed at [facility], [address], [place], [place] [address]    Report Status PENDING  Incomplete  SARS Coronavirus 2 by RT PCR (hospital order, performed in [facility] hospital lab) Nasopharyngeal Nasopharyngeal Swab     Status: None   Collection Time: 03/14/20 10:21 AM   Specimen: Nasopharyngeal Swab  Result Value Ref Range Status   SARS  Coronavirus 2 NEGATIVE NEGATIVE Final    Comment: (NOTE) SARS-CoV-2 target nucleic acids are NOT DETECTED.  The SARS-CoV-2 RNA is generally detectable in upper and lower respiratory  specimens during the acute phase of infection. The lowest concentration of SARS-CoV-2 viral copies this assay can detect is 250 copies / mL. A negative result does not preclude SARS-CoV-2 infection and should not be used as the sole basis for treatment or other patient management decisions.  A negative result may occur with improper specimen collection / handling, submission of specimen other than nasopharyngeal swab, presence of viral mutation(s) within the areas targeted by this assay, and inadequate number of viral copies (<250 copies / mL). A negative result must be combined with clinical observations, patient history, and epidemiological information.  Fact Sheet for Patients:   BoilerBrush.com.cy  Fact Sheet for Healthcare Providers: https://pope.com/  This test is not yet approved or  cleared by the [place] FDA and has been authorized for detection and/or diagnosis of SARS-CoV-2 by FDA under an Emergency Use Authorization (EUA).  This EUA will remain in effect (meaning this test can be used) for the duration of the COVID-19 declaration under Section 564(b)(1) of the Act, 21 U.S.C. section 360bbb-3(b)(1), unless the authorization is terminated or revoked sooner.  Performed at [facility], [address] [place], [place] [address]   Blood Culture (routine x 2)     Status: None (Preliminary result)   Collection Time: 03/14/20 10:23 AM   Specimen: BLOOD  Result Value Ref Range Status   Specimen Description   Final    BLOOD BLOOD LEFT FOREARM Performed at [facility], [address] [place], [place] [address]    Special Requests   Final    BOTTLES DRAWN AEROBIC AND ANAEROBIC Blood Culture adequate volume Performed at [facility], [address], [place], [place] [address]    Culture   Final    NO GROWTH 2 DAYS Performed at [facility], [address], [place], [place] [address]    Report Status PENDING  Incomplete  Urine culture     Status: None   Collection Time: 03/14/20  2:15 PM   Specimen: In/Out Cath Urine  Result Value Ref Range Status   Specimen Description   Final    IN/OUT CATH URINE Performed at [facility], [address] [place], [place] [address]    Special Requests   Final    NONE Performed at [facility], [address] [place], [place] [address]    Culture   Final    NO GROWTH Performed at [facility], [address], [place], [place] [address]    Report Status 03/16/2020 FINAL  Final     Labs:  COVID-19 Labs  Recent Labs    03/14/20 1021  DDIMER 2.51*    Lab Results  Component Value Date   SARSCOV2NAA NEGATIVE 03/14/2020   SARSCOV2NAA NEGATIVE 04/22/2019   SARSCOV2NAA NEGATIVE 01/22/2019      Basic Metabolic Panel: Recent Labs  Lab 03/14/20 1021 03/15/20 0538 03/16/20 0548  NA 138 140 139  K 3.9 4.2 3.8  CL 101 103 101  CO2 28 29 27   GLUCOSE 192* 112* 168*  BUN 19 17 16   CREATININE 1.06 1.19 1.03  CALCIUM 9.0 9.2 9.3   Liver Function Tests: Recent Labs  Lab 03/14/20 1021  AST 17  ALT 20  ALKPHOS 60  BILITOT 0.7  PROT 6.6  ALBUMIN 3.7   No results for input(s): LIPASE, AMYLASE in the last 168 hours. No results for input(s): AMMONIA in the last 168 hours. CBC: Recent Labs  Lab 03/14/20 1021 03/15/20 0538 03/16/20 0548  WBC 12.7* 8.7 6.1  NEUTROABS 11.0*  --   --   HGB 13.9 12.4* 12.5*  HCT 41.5 37.7* 38.6*  MCV 95.2 95.4 96.3  PLT 209 187 175    CBG: Recent Labs  Lab 03/15/20 2012 03/15/20 2353 03/16/20 0410 03/16/20 0738 03/16/20 1209  GLUCAP 206* 269* 166* 171* 218*     IMAGING STUDIES CT Angio Chest PE W/Cm &/Or Wo Cm  Result Date: 03/14/2020 CLINICAL DATA:  Shortness of breath hemoptysis EXAM: CT ANGIOGRAPHY CHEST WITH CONTRAST TECHNIQUE: Multidetector CT imaging of the chest was performed using the  standard protocol during bolus administration of intravenous contrast. Multiplanar CT image reconstructions and MIPs were obtained to evaluate the vascular anatomy. CONTRAST:  68mL OMNIPAQUE IOHEXOL 350 MG/ML SOLN COMPARISON:  March 19, 2008 FINDINGS: Cardiovascular: There is a optimal opacification of the pulmonary arteries. There is no central,segmental, or subsegmental filling defects within the pulmonary arteries. The heart is normal in size. No pericardial effusion or thickening. No evidence right heart strain. There is normal three-vessel brachiocephalic anatomy without proximal stenosis. Coronary artery calcifications are seen. Scattered aortic atherosclerosis is noted. Mediastinum/Nodes: No hilar, mediastinal, or axillary adenopathy. Thyroid gland, trachea, and esophagus demonstrate no significant findings. Lungs/Pleura: Again noted are scattered bilateral pleural plaques as on the prior exam, some of which are mildly calcified. There is multifocal patchy tree-in-bud and ground-glass opacities seen throughout the left lung, predominantly within the left lobe. No pleural effusion is seen. Upper Abdomen: No acute abnormalities present in the visualized portions of the upper abdomen. Musculoskeletal: No chest wall abnormality. No acute or significant osseous findings. Anterior flowing osteophytes are seen in the midthoracic spine. Review of the MIP images confirms the above findings. IMPRESSION: No central, segmental, or subsegmental pulmonary embolism. Multifocal patchy airspace opacities seen throughout the left lung, predominantly within the left lower lung likely due to multifocal pneumonia. Aortic Atherosclerosis (ICD10-I70.0). Electronically Signed   By: Jonna Clark M.D.   On: 03/14/2020 15:03   DG Chest Port 1 View  Result Date: 03/14/2020 CLINICAL DATA:  Fever, hemoptysis EXAM: PORTABLE CHEST 1 VIEW COMPARISON:  04/22/2019 FINDINGS: Stable heart size. Atherosclerotic calcification of the aortic  knob. Patchy airspace consolidation within the left mid to lower lung. Mild hazy right basilar opacity, possibly atelectasis. No pleural effusion or pneumothorax. IMPRESSION: Patchy airspace consolidation within the left mid to lower lung concerning for pneumonia. Radiographic follow-up to resolution is recommended. Electronically Signed   By: Duanne Guess D.O.   On: 03/14/2020 10:30    DISCHARGE EXAMINATION: Vitals:   03/15/20 1420 03/15/20 2017 03/16/20 0619 03/16/20 1345  BP: (!) 149/53 (!) 173/69 (!) 137/52 (!) 148/123  Pulse: (!) 45 (!) 52 60 75  Resp: 17 19 19 16   Temp: 97.8 F (36.6 C) 98 F (36.7 C) 98.4 F (36.9 C) (!) 97.4 F (36.3 C)  TempSrc: Oral Oral Oral Oral  SpO2: 95% 94% 96% 94%  Weight:       General appearance: alert, cooperative, appears stated age and no distress Resp: clear to auscultation bilaterally Cardio: regular rate and rhythm, S1, S2 normal, no murmur, click, rub or gallop GI: soft, non-tender; bowel sounds normal; no masses,  no organomegaly   DISPOSITION: Home  Discharge Instructions    Call MD for:  difficulty breathing, headache or visual disturbances   Complete by: As directed    Call MD for:  extreme fatigue   Complete by: As directed    Call MD for:  persistant dizziness or light-headedness   Complete by: As directed    Call MD for:  persistant nausea and vomiting   Complete by: As directed    Call MD for:  severe uncontrolled pain   Complete by: As directed    Call MD for:  temperature >100.4   Complete by: As directed    Diet Carb Modified   Complete by: As directed    Discharge instructions   Complete by: As directed    Please be sure to follow-up with your primary care provider.  Please take your medications as prescribed.  Keep your head elevated to prevent significant acid reflux which could result in aspiration.  You were cared for by a hospitalist during your hospital stay. If you have any questions about your discharge  medications or the care you received while you were in the hospital after you are discharged, you can call the unit and asked to speak with the hospitalist on call if the hospitalist that took care of you is not available. Once you are discharged, your primary care physician will handle any further medical issues. Please note that NO REFILLS for any discharge medications will be authorized once you are discharged, as it is imperative that you return to your primary care physician (or establish a relationship with a primary care physician if you do not have one) for your aftercare needs so that they can reassess your need for medications and monitor your lab values. If you do not have a primary care physician, you can call [phone] for a physician referral.   Increase activity slowly   Complete by: As directed         Allergies as of 03/16/2020      Reactions   Actos [pioglitazone] Swelling   Formaldehyde Other (See Comments)   Flu-like symptoms   Lipitor [atorvastatin Calcium]    Metformin And Related       Medication List    TAKE these medications   Accu-Chek Aviva Plus test strip Generic drug: glucose blood   aspirin EC 81 MG tablet Take 81 mg by mouth daily.   azithromycin 500 MG tablet Commonly known as: ZITHROMAX Take 1 tablet (500 mg total) by mouth daily for 5 days.   CALCIUM 600+D PO Take 1 tablet by mouth daily.   cefdinir 300 MG capsule Commonly known as: OMNICEF Take 1 capsule (300 mg total) by mouth every 12 (twelve) hours for 5 days.   docusate sodium 100 MG capsule Commonly known as: COLACE Take 100 mg by mouth at bedtime.   famotidine 20 MG tablet Commonly known as: PEPCID Take 1 tablet (20 mg total) by mouth at bedtime.   fluticasone 50 MCG/ACT nasal spray Commonly known as: FLONASE Place 2 sprays into both nostrils 2 (two) times daily.   gabapentin 100 MG capsule Commonly known as: NEURONTIN Take 300 mg by mouth at bedtime.   insulin aspart 100  UNIT/ML FlexPen Commonly known as: NovoLOG FlexPen Before each meal 3 times a day, 140-199 - 2 units, 200-250 - 4 units, 251-299 - 6 units,  300-349 - 8 units,  350 or above 10 units.   insulin glargine 100 UNIT/ML Solostar Pen Commonly known as: LANTUS Inject 15 Units into the skin daily at 10 pm.   lisinopril 5  MG tablet Commonly known as: ZESTRIL Take 1 tablet (5 mg total) by mouth daily.   MULTIVITAMIN PO Take 1 tablet by mouth daily. Centrum Silver   omeprazole 20 MG capsule Commonly known as: PRILOSEC Take 20 mg by mouth daily.   rosuvastatin 10 MG tablet Commonly known as: CRESTOR TAKE 1 TABLET AT BEDTIME   sodium chloride 0.65 % Soln nasal spray Commonly known as: OCEAN Place 1 spray into both nostrils as needed for congestion.   tetrahydrozoline-zinc 0.05-0.25 % ophthalmic solution Commonly known as: VISINE-AC Place 2 drops into both eyes 3 (three) times daily as needed (dry eyes).         Follow-up Information    Marden Noble, MD. Schedule an appointment as soon as possible for a visit in 1 week(s).   Specialty: Internal Medicine Contact information: [address] [place] [address] [phone]               TOTAL DISCHARGE TIME: 35 mins  Jodean Valade Rito Ehrlich  [facility] on [email or web address]  03/16/2020, 2:03 PM

## 2020-03-16 NOTE — Evaluation (Signed)
Physical Therapy Evaluation Patient Details Name: Jackson [name] MRN: [id number] DOB: [birth year] Today's Date: 03/16/2020   History of Present Illness  86 y.o. male with medical history significant for HTN, diabetes, CHF with preserved EF, hypothyroidism, HLD, history of dysphagia and recurrent pneumonia who presented on 03/14/2020 with worsening shortness of breath, cough and chills  Clinical Impression  Patient evaluated by Physical Therapy with no further acute PT needs identified. All education has been completed and the patient has no further questions. Pt reports he is grossly at his baseline for amb other than fatiguing a  Little faster.  Pt amb 180' and then 36' more without AD, unsteady however no overt LOB, does not use AD at baseline.  Encouraged pt to slowly incr activity as tolerated at home, amb with cane initially if feels he is unsteady, educated on slowing speed of movement/amb for safety. No further needs at this time. See below for any follow-up Physical Therapy or equipment needs. PT is signing off. Thank you for this referral.     Follow Up Recommendations No PT follow up    Equipment Recommendations  None recommended by PT    Recommendations for Other Services       Precautions / Restrictions Precautions Precautions: Fall Restrictions Weight Bearing Restrictions: No      Mobility  Bed Mobility               General bed mobility comments: pt in chair  Transfers Overall transfer level: Needs assistance Equipment used: Rolling walker (2 wheeled) Transfers: Sit to/from Stand Sit to Stand: Supervision         General transfer comment: no physical assist, supervision for safety  Ambulation/Gait Ambulation/Gait assistance: Min guard;Supervision Gait Distance (Feet): 180 Feet (80' more) Assistive device: None Gait Pattern/deviations: Step-through pattern;Decreased stride length     General Gait Details: noted leg length discrepancy, occaisonal  slight drifting however no overt LOB; SpO2 reading low after initial mab however believe reading inaccurate d/t decr perfusion; amb second trial with constant monitoring and Sats > or equal to 89% on RA  Stairs            Wheelchair Mobility    Modified Rankin (Stroke Patients Only)       Balance Overall balance assessment: Needs assistance (reports one fall a few years ago)   Sitting balance-Leahy Scale: Good                       High level balance activites: Side stepping;Backward walking;Direction changes;Turns High Level Balance Comments: no LOB with above, min/guard for safety             Pertinent Vitals/Pain Pain Assessment: No/denies pain    Home Living Family/patient expects to be discharged to:: Private residence   Available Help at Discharge: Family;Available 24 hours/day Type of Home: House       Home Layout: Able to live on main level with bedroom/bathroom Home Equipment: Dan Humphreys - 2 wheels;Bedside commode;Cane - single point Additional Comments: pt states he never uses DME, it is in the closet    Prior Function Level of Independence: Independent               Hand Dominance        Extremity/Trunk Assessment   Upper Extremity Assessment Upper Extremity Assessment: Defer to OT evaluation;Overall [facility] for tasks assessed    Lower Extremity Assessment Lower Extremity Assessment: Overall WFL for tasks assessed  Communication   Communication: No difficulties  Cognition Arousal/Alertness: Awake/alert Behavior During Therapy: WFL for tasks assessed/performed Overall Cognitive Status: Within Functional Limits for tasks assessed                                        General Comments      Exercises     Assessment/Plan    PT Assessment Patent does not need any further PT services  PT Problem List         PT Treatment Interventions      PT Goals (Current goals can be found in the Care Plan  section)  Acute Rehab PT Goals Patient Stated Goal: home today PT Goal Formulation: All assessment and education complete, DC therapy    Frequency     Barriers to discharge        Co-evaluation               AM-PAC PT "6 Clicks" Mobility  Outcome Measure Help needed turning from your back to your side while in a flat bed without using bedrails?: None Help needed moving from lying on your back to sitting on the side of a flat bed without using bedrails?: None Help needed moving to and from a bed to a chair (including a wheelchair)?: None Help needed standing up from a chair using your arms (e.g., wheelchair or bedside chair)?: None Help needed to walk in hospital room?: None Help needed climbing 3-5 steps with a railing? : None 6 Click Score: 24    End of Session   Activity Tolerance: Patient tolerated treatment well Patient left: in chair;with call bell/phone within reach Nurse Communication: Mobility status PT Visit Diagnosis: Difficulty in walking, not elsewhere classified (R26.2);Other abnormalities of gait and mobility (R26.89)    Time: 0350-0938 PT Time Calculation (min) (ACUTE ONLY): 19 min   Charges:   PT Evaluation $PT Eval Low Complexity: Woodman, PT  Acute Rehab Dept ([facility]) [phone] Pager [phone]  03/16/2020   [facility] 03/16/2020, 1:48 PM

## 2020-03-19 LAB — CULTURE, BLOOD (ROUTINE X 2)
Culture: NO GROWTH
Culture: NO GROWTH
Special Requests: ADEQUATE
Special Requests: ADEQUATE

## 2020-03-20 DIAGNOSIS — E1140 Type 2 diabetes mellitus with diabetic neuropathy, unspecified: Secondary | ICD-10-CM | POA: Diagnosis not present

## 2020-03-21 DIAGNOSIS — J189 Pneumonia, unspecified organism: Secondary | ICD-10-CM | POA: Diagnosis not present

## 2020-03-23 ENCOUNTER — Other Ambulatory Visit: Payer: Self-pay | Admitting: Cardiology

## 2020-03-23 DIAGNOSIS — I440 Atrioventricular block, first degree: Secondary | ICD-10-CM

## 2020-04-12 DIAGNOSIS — M1990 Unspecified osteoarthritis, unspecified site: Secondary | ICD-10-CM | POA: Diagnosis not present

## 2020-04-12 DIAGNOSIS — N183 Chronic kidney disease, stage 3 unspecified: Secondary | ICD-10-CM | POA: Diagnosis not present

## 2020-04-12 DIAGNOSIS — E11319 Type 2 diabetes mellitus with unspecified diabetic retinopathy without macular edema: Secondary | ICD-10-CM | POA: Diagnosis not present

## 2020-04-12 DIAGNOSIS — M169 Osteoarthritis of hip, unspecified: Secondary | ICD-10-CM | POA: Diagnosis not present

## 2020-04-12 DIAGNOSIS — E1142 Type 2 diabetes mellitus with diabetic polyneuropathy: Secondary | ICD-10-CM | POA: Diagnosis not present

## 2020-04-12 DIAGNOSIS — E1140 Type 2 diabetes mellitus with diabetic neuropathy, unspecified: Secondary | ICD-10-CM | POA: Diagnosis not present

## 2020-04-19 DIAGNOSIS — E1140 Type 2 diabetes mellitus with diabetic neuropathy, unspecified: Secondary | ICD-10-CM | POA: Diagnosis not present

## 2020-04-20 DIAGNOSIS — E1140 Type 2 diabetes mellitus with diabetic neuropathy, unspecified: Secondary | ICD-10-CM | POA: Diagnosis not present

## 2020-05-01 DIAGNOSIS — E1140 Type 2 diabetes mellitus with diabetic neuropathy, unspecified: Secondary | ICD-10-CM | POA: Diagnosis not present

## 2020-05-07 DIAGNOSIS — M169 Osteoarthritis of hip, unspecified: Secondary | ICD-10-CM | POA: Diagnosis not present

## 2020-05-07 DIAGNOSIS — N1831 Chronic kidney disease, stage 3a: Secondary | ICD-10-CM | POA: Diagnosis not present

## 2020-05-07 DIAGNOSIS — E1140 Type 2 diabetes mellitus with diabetic neuropathy, unspecified: Secondary | ICD-10-CM | POA: Diagnosis not present

## 2020-05-07 DIAGNOSIS — E11319 Type 2 diabetes mellitus with unspecified diabetic retinopathy without macular edema: Secondary | ICD-10-CM | POA: Diagnosis not present

## 2020-05-07 DIAGNOSIS — E1142 Type 2 diabetes mellitus with diabetic polyneuropathy: Secondary | ICD-10-CM | POA: Diagnosis not present

## 2020-05-07 DIAGNOSIS — E559 Vitamin D deficiency, unspecified: Secondary | ICD-10-CM | POA: Diagnosis not present

## 2020-05-07 DIAGNOSIS — Z79899 Other long term (current) drug therapy: Secondary | ICD-10-CM | POA: Diagnosis not present

## 2020-05-16 DIAGNOSIS — E1140 Type 2 diabetes mellitus with diabetic neuropathy, unspecified: Secondary | ICD-10-CM | POA: Diagnosis not present

## 2020-05-16 DIAGNOSIS — E1142 Type 2 diabetes mellitus with diabetic polyneuropathy: Secondary | ICD-10-CM | POA: Diagnosis not present

## 2020-05-16 DIAGNOSIS — E11319 Type 2 diabetes mellitus with unspecified diabetic retinopathy without macular edema: Secondary | ICD-10-CM | POA: Diagnosis not present

## 2020-05-16 DIAGNOSIS — M1990 Unspecified osteoarthritis, unspecified site: Secondary | ICD-10-CM | POA: Diagnosis not present

## 2020-05-16 DIAGNOSIS — N183 Chronic kidney disease, stage 3 unspecified: Secondary | ICD-10-CM | POA: Diagnosis not present

## 2020-05-16 DIAGNOSIS — M169 Osteoarthritis of hip, unspecified: Secondary | ICD-10-CM | POA: Diagnosis not present

## 2020-05-21 DIAGNOSIS — E1140 Type 2 diabetes mellitus with diabetic neuropathy, unspecified: Secondary | ICD-10-CM | POA: Diagnosis not present

## 2020-06-12 DIAGNOSIS — M1990 Unspecified osteoarthritis, unspecified site: Secondary | ICD-10-CM | POA: Diagnosis not present

## 2020-06-12 DIAGNOSIS — E11319 Type 2 diabetes mellitus with unspecified diabetic retinopathy without macular edema: Secondary | ICD-10-CM | POA: Diagnosis not present

## 2020-06-12 DIAGNOSIS — N183 Chronic kidney disease, stage 3 unspecified: Secondary | ICD-10-CM | POA: Diagnosis not present

## 2020-06-12 DIAGNOSIS — E1142 Type 2 diabetes mellitus with diabetic polyneuropathy: Secondary | ICD-10-CM | POA: Diagnosis not present

## 2020-06-12 DIAGNOSIS — E1140 Type 2 diabetes mellitus with diabetic neuropathy, unspecified: Secondary | ICD-10-CM | POA: Diagnosis not present

## 2020-06-12 DIAGNOSIS — M169 Osteoarthritis of hip, unspecified: Secondary | ICD-10-CM | POA: Diagnosis not present

## 2020-06-27 ENCOUNTER — Inpatient Hospital Stay (HOSPITAL_COMMUNITY): Payer: Medicare HMO

## 2020-06-27 ENCOUNTER — Emergency Department (HOSPITAL_COMMUNITY): Payer: Medicare HMO

## 2020-06-27 ENCOUNTER — Inpatient Hospital Stay (HOSPITAL_COMMUNITY)
Admission: EM | Admit: 2020-06-27 | Discharge: 2020-07-01 | DRG: 871 | Disposition: A | Discharge status: Home / Self Care | Payer: Medicare HMO | Attending: Internal Medicine | Admitting: Internal Medicine

## 2020-06-27 ENCOUNTER — Other Ambulatory Visit: Payer: Self-pay

## 2020-06-27 ENCOUNTER — Encounter (HOSPITAL_COMMUNITY): Payer: Self-pay

## 2020-06-27 DIAGNOSIS — Z79899 Other long term (current) drug therapy: Secondary | ICD-10-CM | POA: Diagnosis not present

## 2020-06-27 DIAGNOSIS — I441 Atrioventricular block, second degree: Secondary | ICD-10-CM | POA: Diagnosis not present

## 2020-06-27 DIAGNOSIS — Z833 Family history of diabetes mellitus: Secondary | ICD-10-CM | POA: Diagnosis not present

## 2020-06-27 DIAGNOSIS — I442 Atrioventricular block, complete: Secondary | ICD-10-CM | POA: Diagnosis not present

## 2020-06-27 DIAGNOSIS — R0902 Hypoxemia: Secondary | ICD-10-CM

## 2020-06-27 DIAGNOSIS — Z86718 Personal history of other venous thrombosis and embolism: Secondary | ICD-10-CM

## 2020-06-27 DIAGNOSIS — Z87891 Personal history of nicotine dependence: Secondary | ICD-10-CM | POA: Diagnosis not present

## 2020-06-27 DIAGNOSIS — E1165 Type 2 diabetes mellitus with hyperglycemia: Secondary | ICD-10-CM | POA: Diagnosis present

## 2020-06-27 DIAGNOSIS — K219 Gastro-esophageal reflux disease without esophagitis: Secondary | ICD-10-CM | POA: Diagnosis present

## 2020-06-27 DIAGNOSIS — Z66 Do not resuscitate: Secondary | ICD-10-CM | POA: Diagnosis not present

## 2020-06-27 DIAGNOSIS — J189 Pneumonia, unspecified organism: Secondary | ICD-10-CM | POA: Diagnosis not present

## 2020-06-27 DIAGNOSIS — I700 Atherosclerosis of aorta: Secondary | ICD-10-CM | POA: Diagnosis present

## 2020-06-27 DIAGNOSIS — Z96642 Presence of left artificial hip joint: Secondary | ICD-10-CM | POA: Diagnosis present

## 2020-06-27 DIAGNOSIS — R6520 Severe sepsis without septic shock: Secondary | ICD-10-CM | POA: Diagnosis not present

## 2020-06-27 DIAGNOSIS — E11319 Type 2 diabetes mellitus with unspecified diabetic retinopathy without macular edema: Secondary | ICD-10-CM | POA: Diagnosis not present

## 2020-06-27 DIAGNOSIS — I2510 Atherosclerotic heart disease of native coronary artery without angina pectoris: Secondary | ICD-10-CM | POA: Diagnosis not present

## 2020-06-27 DIAGNOSIS — I110 Hypertensive heart disease with heart failure: Secondary | ICD-10-CM | POA: Diagnosis present

## 2020-06-27 DIAGNOSIS — R042 Hemoptysis: Secondary | ICD-10-CM | POA: Diagnosis not present

## 2020-06-27 DIAGNOSIS — Z888 Allergy status to other drugs, medicaments and biological substances status: Secondary | ICD-10-CM

## 2020-06-27 DIAGNOSIS — J690 Pneumonitis due to inhalation of food and vomit: Secondary | ICD-10-CM | POA: Diagnosis present

## 2020-06-27 DIAGNOSIS — N183 Chronic kidney disease, stage 3 unspecified: Secondary | ICD-10-CM | POA: Diagnosis not present

## 2020-06-27 DIAGNOSIS — M1990 Unspecified osteoarthritis, unspecified site: Secondary | ICD-10-CM | POA: Diagnosis not present

## 2020-06-27 DIAGNOSIS — E0590 Thyrotoxicosis, unspecified without thyrotoxic crisis or storm: Secondary | ICD-10-CM | POA: Diagnosis present

## 2020-06-27 DIAGNOSIS — Z794 Long term (current) use of insulin: Secondary | ICD-10-CM | POA: Diagnosis not present

## 2020-06-27 DIAGNOSIS — R778 Other specified abnormalities of plasma proteins: Secondary | ICD-10-CM

## 2020-06-27 DIAGNOSIS — J9601 Acute respiratory failure with hypoxia: Secondary | ICD-10-CM | POA: Diagnosis present

## 2020-06-27 DIAGNOSIS — E785 Hyperlipidemia, unspecified: Secondary | ICD-10-CM | POA: Diagnosis present

## 2020-06-27 DIAGNOSIS — Z8701 Personal history of pneumonia (recurrent): Secondary | ICD-10-CM

## 2020-06-27 DIAGNOSIS — A419 Sepsis, unspecified organism: Secondary | ICD-10-CM | POA: Diagnosis not present

## 2020-06-27 DIAGNOSIS — R0602 Shortness of breath: Secondary | ICD-10-CM | POA: Diagnosis not present

## 2020-06-27 DIAGNOSIS — Z20822 Contact with and (suspected) exposure to covid-19: Secondary | ICD-10-CM | POA: Diagnosis not present

## 2020-06-27 DIAGNOSIS — J9691 Respiratory failure, unspecified with hypoxia: Secondary | ICD-10-CM | POA: Diagnosis not present

## 2020-06-27 DIAGNOSIS — I350 Nonrheumatic aortic (valve) stenosis: Secondary | ICD-10-CM | POA: Diagnosis present

## 2020-06-27 DIAGNOSIS — H9190 Unspecified hearing loss, unspecified ear: Secondary | ICD-10-CM | POA: Diagnosis present

## 2020-06-27 DIAGNOSIS — R0689 Other abnormalities of breathing: Secondary | ICD-10-CM | POA: Diagnosis not present

## 2020-06-27 DIAGNOSIS — I248 Other forms of acute ischemic heart disease: Secondary | ICD-10-CM | POA: Diagnosis present

## 2020-06-27 DIAGNOSIS — E1140 Type 2 diabetes mellitus with diabetic neuropathy, unspecified: Secondary | ICD-10-CM | POA: Diagnosis present

## 2020-06-27 DIAGNOSIS — Z7982 Long term (current) use of aspirin: Secondary | ICD-10-CM | POA: Diagnosis not present

## 2020-06-27 DIAGNOSIS — E1142 Type 2 diabetes mellitus with diabetic polyneuropathy: Secondary | ICD-10-CM | POA: Diagnosis not present

## 2020-06-27 DIAGNOSIS — K449 Diaphragmatic hernia without obstruction or gangrene: Secondary | ICD-10-CM | POA: Diagnosis not present

## 2020-06-27 DIAGNOSIS — I517 Cardiomegaly: Secondary | ICD-10-CM | POA: Diagnosis not present

## 2020-06-27 DIAGNOSIS — I10 Essential (primary) hypertension: Secondary | ICD-10-CM | POA: Diagnosis present

## 2020-06-27 DIAGNOSIS — R7989 Other specified abnormal findings of blood chemistry: Secondary | ICD-10-CM | POA: Diagnosis not present

## 2020-06-27 DIAGNOSIS — R404 Transient alteration of awareness: Secondary | ICD-10-CM | POA: Diagnosis not present

## 2020-06-27 DIAGNOSIS — I5032 Chronic diastolic (congestive) heart failure: Secondary | ICD-10-CM | POA: Diagnosis not present

## 2020-06-27 DIAGNOSIS — J9600 Acute respiratory failure, unspecified whether with hypoxia or hypercapnia: Secondary | ICD-10-CM | POA: Diagnosis present

## 2020-06-27 DIAGNOSIS — J929 Pleural plaque without asbestos: Secondary | ICD-10-CM | POA: Diagnosis not present

## 2020-06-27 DIAGNOSIS — E113291 Type 2 diabetes mellitus with mild nonproliferative diabetic retinopathy without macular edema, right eye: Secondary | ICD-10-CM | POA: Diagnosis not present

## 2020-06-27 LAB — CBC WITH DIFFERENTIAL/PLATELET
Abs Immature Granulocytes: 0.04 10*3/uL (ref 0.00–0.07)
Basophils Absolute: 0 10*3/uL (ref 0.0–0.1)
Basophils Relative: 0 %
Eosinophils Absolute: 0.1 10*3/uL (ref 0.0–0.5)
Eosinophils Relative: 1 %
HCT: 43.1 % (ref 39.0–52.0)
Hemoglobin: 14.4 g/dL (ref 13.0–17.0)
Immature Granulocytes: 0 %
Lymphocytes Relative: 5 %
Lymphs Abs: 0.5 10*3/uL — ABNORMAL LOW (ref 0.7–4.0)
MCH: 31.2 pg (ref 26.0–34.0)
MCHC: 33.4 g/dL (ref 30.0–36.0)
MCV: 93.3 fL (ref 80.0–100.0)
Monocytes Absolute: 0.8 10*3/uL (ref 0.1–1.0)
Monocytes Relative: 8 %
Neutro Abs: 8 10*3/uL — ABNORMAL HIGH (ref 1.7–7.7)
Neutrophils Relative %: 86 %
Platelets: 214 10*3/uL (ref 150–400)
RBC: 4.62 MIL/uL (ref 4.22–5.81)
RDW: 12.8 % (ref 11.5–15.5)
WBC: 9.4 10*3/uL (ref 4.0–10.5)
nRBC: 0 % (ref 0.0–0.2)

## 2020-06-27 LAB — URINALYSIS, ROUTINE W REFLEX MICROSCOPIC
Bilirubin Urine: NEGATIVE
Glucose, UA: 50 mg/dL — AB
Ketones, ur: NEGATIVE mg/dL
Leukocytes,Ua: NEGATIVE
Nitrite: NEGATIVE
Protein, ur: 30 mg/dL — AB
Specific Gravity, Urine: 1.008 (ref 1.005–1.030)
pH: 6 (ref 5.0–8.0)

## 2020-06-27 LAB — TROPONIN I (HIGH SENSITIVITY)
Troponin I (High Sensitivity): 80 ng/L — ABNORMAL HIGH (ref <18)
Troponin I (High Sensitivity): 483 ng/L (ref <18)
Troponin I (High Sensitivity): 657 ng/L (ref <18)
Troponin I (High Sensitivity): 569 ng/L (ref <18)

## 2020-06-27 LAB — COMPREHENSIVE METABOLIC PANEL
ALT: 18 U/L (ref 0–44)
AST: 20 U/L (ref 15–41)
Albumin: 3.9 g/dL (ref 3.5–5.0)
Alkaline Phosphatase: 69 U/L (ref 38–126)
Anion gap: 13 (ref 5–15)
BUN: 14 mg/dL (ref 8–23)
CO2: 27 mmol/L (ref 22–32)
Calcium: 9.5 mg/dL (ref 8.9–10.3)
Chloride: 101 mmol/L (ref 98–111)
Creatinine, Ser: 1.02 mg/dL (ref 0.61–1.24)
GFR calc non Af Amer: >60 mL/min (ref >60)
Glucose, Bld: 219 mg/dL — ABNORMAL HIGH (ref 70–99)
Potassium: 3.8 mmol/L (ref 3.5–5.1)
Sodium: 141 mmol/L (ref 135–145)
Total Bilirubin: 0.9 mg/dL (ref 0.3–1.2)
Total Protein: 7.2 g/dL (ref 6.5–8.1)

## 2020-06-27 LAB — RESPIRATORY PANEL BY RT PCR (FLU A&B, COVID)
Influenza A by PCR: NEGATIVE
Influenza B by PCR: NEGATIVE
SARS Coronavirus 2 by RT PCR: NEGATIVE

## 2020-06-27 LAB — EXPECTORATED SPUTUM ASSESSMENT W GRAM STAIN, RFLX TO RESP C

## 2020-06-27 LAB — LACTIC ACID, PLASMA
Lactic Acid, Venous: 2 mmol/L (ref 0.5–1.9)
Lactic Acid, Venous: 2.1 mmol/L (ref 0.5–1.9)

## 2020-06-27 LAB — GLUCOSE, CAPILLARY
Glucose-Capillary: 141 mg/dL — ABNORMAL HIGH (ref 70–99)
Glucose-Capillary: 162 mg/dL — ABNORMAL HIGH (ref 70–99)

## 2020-06-27 LAB — CBG MONITORING, ED: Glucose-Capillary: 112 mg/dL — ABNORMAL HIGH (ref 70–99)

## 2020-06-27 LAB — PROTIME-INR
INR: 1 (ref 0.8–1.2)
Prothrombin Time: 13.2 seconds (ref 11.4–15.2)

## 2020-06-27 LAB — HEMOGLOBIN A1C
Hgb A1c MFr Bld: 8 % — ABNORMAL HIGH (ref 4.8–5.6)
Mean Plasma Glucose: 182.9 mg/dL

## 2020-06-27 LAB — APTT: aPTT: 25 seconds (ref 24–36)

## 2020-06-27 MED ORDER — LACTATED RINGERS IV BOLUS (SEPSIS)
500.0000 mL | Freq: Once | INTRAVENOUS | Status: AC
  Administered 2020-06-27: 500 mL via INTRAVENOUS

## 2020-06-27 MED ORDER — INSULIN GLARGINE 100 UNIT/ML ~~LOC~~ SOLN
15.0000 [IU] | Freq: Every day | SUBCUTANEOUS | Status: DC
  Administered 2020-06-27 – 2020-06-30 (×4): 15 [IU] via SUBCUTANEOUS
  Filled 2020-06-27 (×5): qty 0.15

## 2020-06-27 MED ORDER — SODIUM CHLORIDE 0.9 % IV SOLN
500.0000 mg | INTRAVENOUS | Status: DC
  Administered 2020-06-27: 500 mg via INTRAVENOUS
  Filled 2020-06-27 (×2): qty 500

## 2020-06-27 MED ORDER — DOCUSATE SODIUM 100 MG PO CAPS
100.0000 mg | ORAL_CAPSULE | Freq: Every day | ORAL | Status: DC
  Administered 2020-06-27 – 2020-06-30 (×4): 100 mg via ORAL
  Filled 2020-06-27 (×4): qty 1

## 2020-06-27 MED ORDER — NAPHAZOLINE-GLYCERIN 0.012-0.2 % OP SOLN
2.0000 [drp] | Freq: Four times a day (QID) | OPHTHALMIC | Status: DC | PRN
  Filled 2020-06-27: qty 15

## 2020-06-27 MED ORDER — SODIUM CHLORIDE 0.9 % IV SOLN
2.0000 g | INTRAVENOUS | Status: DC
  Administered 2020-06-27: 2 g via INTRAVENOUS
  Filled 2020-06-27 (×2): qty 20

## 2020-06-27 MED ORDER — LACTATED RINGERS IV SOLN: INTRAVENOUS | Status: DC

## 2020-06-27 MED ORDER — SALINE SPRAY 0.65 % NA SOLN
1.0000 | NASAL | Status: DC | PRN
  Filled 2020-06-27: qty 44

## 2020-06-27 MED ORDER — LACTATED RINGERS IV BOLUS (SEPSIS)
1000.0000 mL | Freq: Once | INTRAVENOUS | Status: AC
  Administered 2020-06-27: 1000 mL via INTRAVENOUS

## 2020-06-27 MED ORDER — INSULIN ASPART 100 UNIT/ML ~~LOC~~ SOLN
0.0000 [IU] | Freq: Three times a day (TID) | SUBCUTANEOUS | Status: DC
  Administered 2020-06-27 – 2020-06-28 (×2): 2 [IU] via SUBCUTANEOUS
  Administered 2020-06-28 – 2020-06-29 (×3): 8 [IU] via SUBCUTANEOUS
  Administered 2020-06-29: 2 [IU] via SUBCUTANEOUS
  Administered 2020-06-30: 5 [IU] via SUBCUTANEOUS
  Administered 2020-06-30: 8 [IU] via SUBCUTANEOUS
  Administered 2020-06-30: 2 [IU] via SUBCUTANEOUS
  Administered 2020-07-01: 3 [IU] via SUBCUTANEOUS
  Filled 2020-06-27: qty 0.15

## 2020-06-27 MED ORDER — ALBUTEROL SULFATE (2.5 MG/3ML) 0.083% IN NEBU
2.5000 mg | INHALATION_SOLUTION | Freq: Four times a day (QID) | RESPIRATORY_TRACT | Status: DC | PRN
  Administered 2020-06-30: 2.5 mg via RESPIRATORY_TRACT
  Filled 2020-06-27: qty 3

## 2020-06-27 MED ORDER — ONDANSETRON HCL 4 MG PO TABS: 4.0000 mg | ORAL_TABLET | Freq: Four times a day (QID) | ORAL | Status: DC | PRN

## 2020-06-27 MED ORDER — ONDANSETRON HCL 4 MG/2ML IJ SOLN: 4.0000 mg | Freq: Four times a day (QID) | INTRAMUSCULAR | Status: DC | PRN

## 2020-06-27 MED ORDER — GABAPENTIN 300 MG PO CAPS
300.0000 mg | ORAL_CAPSULE | Freq: Every day | ORAL | Status: DC
  Administered 2020-06-27 – 2020-06-30 (×4): 300 mg via ORAL
  Filled 2020-06-27 (×4): qty 1

## 2020-06-27 MED ORDER — INSULIN ASPART 100 UNIT/ML ~~LOC~~ SOLN
0.0000 [IU] | Freq: Every day | SUBCUTANEOUS | Status: DC
  Administered 2020-06-28: 3 [IU] via SUBCUTANEOUS
  Administered 2020-06-29: 2 [IU] via SUBCUTANEOUS
  Filled 2020-06-27: qty 0.05

## 2020-06-27 MED ORDER — SODIUM CHLORIDE (PF) 0.9 % IJ SOLN
INTRAMUSCULAR | Status: AC
  Filled 2020-06-27: qty 50

## 2020-06-27 MED ORDER — ROSUVASTATIN CALCIUM 10 MG PO TABS
10.0000 mg | ORAL_TABLET | Freq: Every day | ORAL | Status: DC
  Administered 2020-06-27 – 2020-06-30 (×4): 10 mg via ORAL
  Filled 2020-06-27 (×4): qty 1

## 2020-06-27 MED ORDER — LACTATED RINGERS IV SOLN: INTRAVENOUS | Status: AC

## 2020-06-27 MED ORDER — ASPIRIN EC 81 MG PO TBEC
81.0000 mg | DELAYED_RELEASE_TABLET | Freq: Every day | ORAL | Status: DC
  Administered 2020-06-27 – 2020-07-01 (×5): 81 mg via ORAL
  Filled 2020-06-27 (×5): qty 1

## 2020-06-27 MED ORDER — FLUTICASONE PROPIONATE 50 MCG/ACT NA SUSP
2.0000 | Freq: Every day | NASAL | Status: DC
  Administered 2020-06-27 – 2020-07-01 (×5): 2 via NASAL
  Filled 2020-06-27: qty 16

## 2020-06-27 MED ORDER — ACETAMINOPHEN 500 MG PO TABS
1000.0000 mg | ORAL_TABLET | Freq: Once | ORAL | Status: AC
  Administered 2020-06-27: 1000 mg via ORAL
  Filled 2020-06-27: qty 2

## 2020-06-27 MED ORDER — IOHEXOL 350 MG/ML SOLN
100.0000 mL | Freq: Once | INTRAVENOUS | Status: AC | PRN
  Administered 2020-06-27: 100 mL via INTRAVENOUS

## 2020-06-27 NOTE — ED Triage Notes (Signed)
Patient BIB [facility]. Patients wife says this happens every couple of years. Patient has been coughing up blood, febrile, short of breath. Wife says he gets aspiration pneumonia very easy. Wife called EMS because she couldn't get him to wake up and he was incontinent. He was 75% Oxygen on room air, up to 92% on a nonrebreather at 15L. Patient is diabetic. 213 CBG. Temp 101.66F oral. Respirations 40. End tidal C02 25-35.

## 2020-06-27 NOTE — Consult Note (Addendum)
Cardiology Consultation:   Patient ID: Jackson [name]; [id number]; June 08, 1929   Admit date: 06/27/2020 Date of Consult: 06/27/2020  Primary Care Provider: Josetta Huddle, MD Primary Cardiologist: Virl Axe, MD televisit 12/20/2018 S. Kathlen Mody, [facility] 06/20/2019 Primary Electrophysiologist:  None   Patient Profile:   Jackson [name] is a 86 y.o. male with a hx of 1st degree and 2nd degree Mobitz I heart block, CHB during acute illness w/ junctional bts (no PPM indicated), AS, DM, HTN, low-risk MV 06/2018, DVT 09/2017, recurrent PNA, who is being seen today for the evaluation of elevated troponin at the request of Dr Eulis Foster.  History of Present Illness:   Mr. Oestreicher was in his usual state of health yesterday.  He was working around the house with his wife.  He did not experience any chest pain or shortness of breath with the exertion.  Today, he was feeling bad thought he had a fever and had a cough.  He developed hemoptysis and shortness of breath. 911 was called.  He was hypoxic with an O2 sat of 75% per EMS.  His condition improved with oxygen.  He is currently off and on rebreather and on nasal cannula with O2 saturation 92%.  He has not had any chest pain.  He has not had any dyspnea on exertion.  He admits that he does not do as much as he used to her work as fast, but does not feel limited by his breathing.    He has not had any palpitations, presyncope or syncope.  He gets a little lightheaded when he first gets up out of a chair or gets up out of bed, this resolves in a short period of time.  No falls.    He does not wake with lower extremity edema.  He does not wake in the night with shortness of breath.  He normally stays pretty busy and is not report any ischemic symptoms.  He is concerned that he sits on the couch in the evenings and does not do anything, this is after working during the day.  Past Medical History:  Diagnosis Date  . Aortic stenosis    Echo 3/18: mild  aortic stenosis (mean 12/peak 22 // Echo 3/19: mild AS (mean 12, peak 20) // Echo 02/2019: EF 67-20, grade 1 diastolic dysfunction, mild MAC, mild aortic stenosis (mean gradient 9), mild dilation of the ascending aorta (38 mm)   . Atherosclerosis of aorta ([place]) 12/16/2016   CXR 6/16: IMPRESSION: 1. Resolved left basilar airspace opacities. 2. Hazy nodularity in both lungs compatible with the patient's known pleural plaques. 3. Right anterior hemidiaphragmatic eventration. 4. Thoracic spondylosis. 5. Atherosclerotic calcification of the aortic arch.  . Bradycardia    Low HR (30s) noted during sleep during admx for sepsis in 2016; Mobitz 1, 2:1 block >> no indication for pacer; management of OSA recommended // transient CHB noted on ECG during admit in 10/2018   . Diabetes mellitus without complication ([place])   . Diabetic neuropathy ([place])   . DJD (degenerative joint disease)    knee, hip  . Echocardiogram    Echo 6/16: mod LVH, EF 65-70, vigorous LVF, no RWMA, mild MR, mild LAE // Echo 3/18: EF 60-65, Gr 1 DD, mild AS, PASP 47 // Echo 3/19: EF 60-65, Gr 1 DD, mild AS (mean 12) // Echo 7/19: EF 55-60, Gr 1 DD, PASP 36  . GERD (gastroesophageal reflux disease)   . Heart murmur   . HLD (hyperlipidemia)   .  Hypertension   . Hyperthyroidism   . Lexiscan Myoview 06/2018   Normal perfusion, EF 81; Low Risk  . Mild diastolic dysfunction 19/62/2297   Noted on Echocardiogram; Patient does not require diuretic Rx  . Phlebitis of left leg   . Pneumonia 02/2015   admx with sepsis     Past Surgical History:  Procedure Laterality Date  . CATARACT EXTRACTION Right   . CIRCUMCISION    . FRACTURE SURGERY    . TOTAL HIP ARTHROPLASTY Left 09/24/2017   Procedure: LEFT TOTAL HIP ARTHROPLASTY ANTERIOR APPROACH;  Surgeon: Mcarthur Rossetti, MD;  Location: [facility];  Service: Orthopedics;  Laterality: Left;     Prior to Admission medications   Medication Sig Start Date End Date Taking? Authorizing Provider    albuterol (PROVENTIL) (2.5 MG/3ML) 0.083% nebulizer solution Take 2.5 mg by nebulization every 6 (six) hours as needed for wheezing or shortness of breath.   Yes [provider]  aspirin EC 81 MG tablet Take 81 mg by mouth daily.   Yes [provider]  Calcium Carbonate-Vitamin D (CALCIUM 600+D PO) Take 1 tablet by mouth daily.   Yes [provider]  docusate sodium (COLACE) 100 MG capsule Take 100 mg by mouth at bedtime.   Yes [provider]  fluticasone (FLONASE) 50 MCG/ACT nasal spray Place 2 sprays into both nostrils 2 (two) times daily. 11/20/18  Yes Arrien, Jimmy Picket, MD  gabapentin (NEURONTIN) 100 MG capsule Take 300 mg by mouth at bedtime.    Yes [provider]  insulin aspart (NOVOLOG FLEXPEN) 100 UNIT/ML FlexPen Before each meal 3 times a day, 140-199 - 2 units, 200-250 - 4 units, 251-299 - 6 units,  300-349 - 8 units,  350 or above 10 units. Patient taking differently: Inject 2-10 Units into the skin See admin instructions. Per sliding scale before each meal 3 times a day, 140-199 - 2 units, 200-250 - 4 units, 251-299 - 6 units,  300-349 - 8 units,  350 or above 10 units. 02/28/15  Yes Florencia Reasons, MD  Insulin Glargine (LANTUS) 100 UNIT/ML Solostar Pen Inject 15 Units into the skin daily at 10 pm.   Yes [provider]  lisinopril (PRINIVIL,ZESTRIL) 5 MG tablet Take 1 tablet (5 mg total) by mouth daily. 12/26/18  Yes Patwardhan, Manish J, MD  omeprazole (PRILOSEC) 20 MG capsule Take 20 mg by mouth daily.  10/20/16  Yes [provider]  rosuvastatin (CRESTOR) 10 MG tablet TAKE 1 TABLET AT BEDTIME Patient taking differently: Take 10 mg by mouth at bedtime.  03/26/20  Yes Adrian Prows, MD  sodium chloride (OCEAN) 0.65 % SOLN nasal spray Place 1 spray into both nostrils as needed for congestion. 11/20/18  Yes Arrien, Jimmy Picket, MD  tetrahydrozoline-zinc (VISINE-AC) 0.05-0.25 % ophthalmic solution Place 2 drops into both eyes 3  (three) times daily as needed (dry eyes).   Yes [provider]    Inpatient Medications: Scheduled Meds:  Continuous Infusions: . azithromycin Stopped (06/27/20 1028)  . cefTRIAXone (ROCEPHIN)  IV Stopped (06/27/20 1002)  . lactated ringers 150 mL/hr at 06/27/20 1102   PRN Meds:   Allergies:    Allergies  Allergen Reactions  . Actos [Pioglitazone] Swelling  . Formaldehyde Other (See Comments)    Flu-like symptoms  . Lipitor [Atorvastatin Calcium] Other (See Comments)    unknown  . Metformin And Related Other (See Comments)    unknown    Social History:   Social History   Socioeconomic  History  . Marital status: Married    Spouse name: Not on file  . Number of children: Not on file  . Years of education: Not on file  . Highest education level: Not on file  Occupational History  . Occupation: retired  Tobacco Use  . Smoking status: Former Smoker    Quit date: 1960    Years since quitting: 61.8  . Smokeless tobacco: Never Used  Vaping Use  . Vaping Use: Never used  Substance and Sexual Activity  . Alcohol use: No  . Drug use: No  . Sexual activity: Not on file  Other Topics Concern  . Not on file  Social History Narrative  . Not on file   Social Determinants of Health   Financial Resource Strain:   . Difficulty of Paying Living Expenses: Not on file  Food Insecurity:   . Worried About Charity fundraiser in the Last Year: Not on file  . Ran Out of Food in the Last Year: Not on file  Transportation Needs:   . Lack of Transportation (Medical): Not on file  . Lack of Transportation (Non-Medical): Not on file  Physical Activity:   . Days of Exercise per Week: Not on file  . Minutes of Exercise per Session: Not on file  Stress:   . Feeling of Stress : Not on file  Social Connections:   . Frequency of Communication with Friends and Family: Not on file  . Frequency of Social Gatherings with Friends and Family: Not on file  . Attends Religious  Services: Not on file  . Active Member of Clubs or Organizations: Not on file  . Attends Archivist Meetings: Not on file  . Marital Status: Not on file  Intimate Partner Violence:   . Fear of Current or Ex-Partner: Not on file  . Emotionally Abused: Not on file  . Physically Abused: Not on file  . Sexually Abused: Not on file    Family History:   Family History  Problem Relation Age of Onset  . Diabetes Mother   . Diabetes Sister   . Diabetes Brother   . Heart attack Neg Hx   . Stroke Neg Hx   . Hypertension Neg Hx    Family Status:  Family Status  Relation Name Status  . Mother  Deceased  . Father  Deceased  . Sister  (Not Specified)  . Brother  (Not Specified)  . MGM  Deceased  . MGF  Deceased  . PGM  Deceased  . PGF  Deceased  . Neg Hx  (Not Specified)    ROS:  Please see the history of present illness.  All other ROS reviewed and negative.     Physical Exam/Data:   Vitals:   06/27/20 1216 06/27/20 1217 06/27/20 1218 06/27/20 1219  BP: 97/64     Pulse: 85     Resp: (!) 21     Temp:      TempSrc:      SpO2: 91% (!) 89% 90% 94%  Weight:      Height:        Intake/Output Summary (Last 24 hours) at 06/27/2020 1222 Last data filed at 06/27/2020 1101 Gross per 24 hour  Intake 3071.41 ml  Output --  Net 3071.41 ml    Last 3 Weights 06/27/2020 03/14/2020 06/20/2019  Weight (lbs) 175 lb 177 lb 11.1 oz 181 lb 1.9 oz  Weight (kg) 79.379 kg 80.6 kg 82.155 kg  Body mass index is 26.61 kg/m.   General:  Well nourished, well developed, elderly male in no acute distress HEENT: normal Lymph: no adenopathy Neck: JVD -not elevated Endocrine:  No thryomegaly Vascular: No carotid bruits; 4/4 extremity pulses 2+  Cardiac:  normal S1, S2; RRR; ?  Soft murmur but respirations are noisy and it is difficult to discern Lungs: Rales bilaterally, right greater than left, no wheezing, rhonchi  Abd: soft, nontender, no hepatomegaly  Ext: no  edema Musculoskeletal:  No deformities, BUE and BLE strength weak but equal Skin: warm and dry  Neuro:  CNs 2-12 intact, no focal abnormalities noted Psych:  Normal affect   EKG:  The EKG was personally reviewed and demonstrates:  ?Wenkebach vs 1st deg AVB and non-conducted PAC Telemetry:  Telemetry was personally reviewed and demonstrates: Wenkebach   CV studies:   ECHO: 03/10/2019 1. The left ventricle has normal systolic function with an ejection  fraction of 60-65%. The cavity size was normal. Left ventricular diastolic  Doppler parameters are consistent with impaired relaxation. Elevated mean  left atrial pressure.  2. The right ventricle has normal systolic function. The cavity was  normal.  3. The mitral valve is abnormal. Mild thickening of the mitral valve  leaflet. There is mild mitral annular calcification present.  4. The tricuspid valve is grossly normal.  5. The aortic valve is tricuspid. Moderate calcification of the aortic  valve. Mild stenosis of the aortic valve.  6. There is mild dilatation of the ascending aorta measuring 38 mm.  7. Normal LV systolic function; mild LVH with proximal septal thickening;  mild diastolic dysfunction; calcified aortic valve with reduced excursion  of left coronary cusp; very mild AS (mean gradient 9 mmHg); no AI; mildly  dilated aortic root.   MYOVIEW: (Dr Einar Gip) 07/04/2018 EF 81%, no RWMA, episodes sinus arrest w/ atrial escape rhythm during infusion, LAFB, IRBBB. Low risk study   Laboratory Data:   Chemistry Recent Labs  Lab 06/27/20 0900  NA 141  K 3.8  CL 101  CO2 27  GLUCOSE 219*  BUN 14  CREATININE 1.02  CALCIUM 9.5  GFRNONAA >60  ANIONGAP 13    Lab Results  Component Value Date   ALT 18 06/27/2020   AST 20 06/27/2020   ALKPHOS 69 06/27/2020   BILITOT 0.9 06/27/2020   Hematology Recent Labs  Lab 06/27/20 0900  WBC 9.4  RBC 4.62  HGB 14.4  HCT 43.1  MCV 93.3  MCH 31.2  MCHC 33.4  RDW  12.8  PLT 214   Cardiac Enzymes High Sensitivity Troponin:   Recent Labs  Lab 06/27/20 0900 06/27/20 1100  TROPONINIHS 80* 483*      BNPNo results for input(s): BNP, PROBNP in the last 168 hours.  DDimer No results for input(s): DDIMER in the last 168 hours. TSH: No results found for: TSH Lipids:No results found for: CHOL, HDL, LDLCALC, LDLDIRECT, TRIG, CHOLHDL HgbA1c: Lab Results  Component Value Date   HGBA1C 8.2 (H) 11/18/2018   Magnesium: No results found for: MG   Radiology/Studies:  DG Chest Port 1 View  Result Date: 06/27/2020 CLINICAL DATA:  Questionable sepsis, evaluate for abnormality EXAM: PORTABLE CHEST 1 VIEW COMPARISON:  03/14/2020 FINDINGS: Mild cardiomegaly. There is dense, heterogeneous airspace opacity of the right midlung, new compared to prior examination. The visualized skeletal structures are unremarkable. IMPRESSION: 1. There is dense, heterogeneous airspace opacity of the right midlung, new compared to prior examination, concerning for infection. Recommend follow-up radiographs in  6-8 weeks to ensure complete radiographic resolution. 2. Mild cardiomegaly. Electronically Signed   By: Eddie Candle M.D.   On: 06/27/2020 09:27    Assessment and Plan:   1. Elevated troponin -This is in the setting of febrile illness, pneumonia, with hypoxia. -No history of ischemic symptoms -ECG is nonacute -Will order an echocardiogram -Continue home dose of ASA 81 mg -Was not on a beta-blocker prior to admission because of his history of bradycardia and heart block -If echo is normal and he continues to have no ischemic symptoms, doubt further work-up is indicated -Would not pursue an ischemic eval during his acute illness, follow-up as an outpatient and discuss at that time  Otherwise, per IM Active Problems:   Sepsis ([place])     For questions or updates, please contact [facility] Please consult [email or web address] for contact info under Cardiology/STEMI.    Signed, Rosaria Ferries, PA-C  06/27/2020 12:22 PM  I have seen and examined the patient along with Rosaria Ferries, PA-C .  I have reviewed the chart, notes and new data.  I agree with PA/NP's note.  Key new complaints: did not have angina Key examination changes: irregular rhythm, murmur of Ao sclerosis/stenosis, no overt HF findings Key new findings / data: ECG shows SR with very long Wenckebach cycles (9:8 Mobitz 1 second degree AV block) On CT, there is minimal coronary calcification for his age (in the mid distal LAD).  PLAN: Suspect demand ischemia in the setting of severe hypoxia and febrile illness. Will follow up on echo, but agree that he will not need inpatient ischemic evaluation. His AV block is asymptomatic.  Sanda Klein, MD, [place] [phone] 06/27/2020, 1:23 PM

## 2020-06-27 NOTE — Progress Notes (Signed)
CRITICAL VALUE ALERT  Critical Value:  Troponin 657  Date & Time Notied:  06/27/20  1616  Provider Notified: Ronaldo Miyamoto @ 1625, Cro[place]u @1628   Orders Received/Actions taken: No new orders, cont IVF, q2 troponins, and monitor Tele.

## 2020-06-27 NOTE — ED Notes (Signed)
Patient transported to CT 

## 2020-06-27 NOTE — ED Notes (Signed)
Jackson [name], wife, [phone]

## 2020-06-27 NOTE — H&P (Signed)
History and Physical    Jackson [name]:[id number] DOB: 21-May-1929 DOA: 06/27/2020  PCP: Josetta Huddle, MD  Patient coming from: Home  Chief Complaint: Chills  HPI: Jackson [name] is a 86 y.o. male with medical history significant of DM, DM neuropathy, HLD, aortic stenosis, aspiration. Presenting with respiratory distress, chills, aspiration starting this morning. Hx from wife by phone. She reports that around 8 AM, she noticed that he was having a lot of chills and then began to vomit. During this vomiting episode, he started choking and had difficulty breathing. She became concerned and called for EMS. When they arrived, they noted him to be hypoxic, and they brought him to the ED. Of note, she states that the patient has had several episodes of aspirations, with his last event being in June. He has a Hx of GERD, for which, he takes prilosec. She reports he had no haralding symptoms for today. No aggravating or alleviating factors noted. No treatment tried prior to called EMS.     ED Course: CXR showed RML opacity concerning for infection. He was initially on NRB at presentation but weaned to 2L [place]. He was started on sepsis protocol in the ED. He was also found to have hemoptysis on presentation.   Review of Systems:  He denies CP, palpitations, ab pain. Review of systems is otherwise negative for all not mentioned in HPI.   PMHx Past Medical History:  Diagnosis Date  . Aortic stenosis    Echo 3/18: mild aortic stenosis (mean 12/peak 22 // Echo 3/19: mild AS (mean 12, peak 20) // Echo 02/2019: EF 24-26, grade 1 diastolic dysfunction, mild MAC, mild aortic stenosis (mean gradient 9), mild dilation of the ascending aorta (38 mm)   . Atherosclerosis of aorta ([place]) 12/16/2016   CXR 6/16: IMPRESSION: 1. Resolved left basilar airspace opacities. 2. Hazy nodularity in both lungs compatible with the patient's known pleural plaques. 3. Right anterior hemidiaphragmatic eventration. 4. Thoracic  spondylosis. 5. Atherosclerotic calcification of the aortic arch.  . Bradycardia    Low HR (30s) noted during sleep during admx for sepsis in 2016; [place] 1, 2:1 block >> no indication for pacer; management of OSA recommended // transient CHB noted on ECG during admit in 10/2018   . Diabetes mellitus without complication ([place])   . Diabetic neuropathy ([place])   . DJD (degenerative joint disease)    knee, hip  . Echocardiogram    Echo 6/16: mod LVH, EF 65-70, vigorous LVF, no RWMA, mild MR, mild LAE // Echo 3/18: EF 60-65, Gr 1 DD, mild AS, PASP 47 // Echo 3/19: EF 60-65, Gr 1 DD, mild AS (mean 12) // Echo 7/19: EF 55-60, Gr 1 DD, PASP 36  . GERD (gastroesophageal reflux disease)   . Heart murmur   . HLD (hyperlipidemia)   . Hypertension   . Hyperthyroidism   . Lexiscan Myoview 06/2018   Normal perfusion, EF 81; Low Risk  . Mild diastolic dysfunction 83/41/9622   Noted on Echocardiogram; Patient does not require diuretic Rx  . Phlebitis of left leg   . Pneumonia 02/2015   admx with sepsis     PSHx Past Surgical History:  Procedure Laterality Date  . CATARACT EXTRACTION Right   . CIRCUMCISION    . FRACTURE SURGERY    . TOTAL HIP ARTHROPLASTY Left 09/24/2017   Procedure: LEFT TOTAL HIP ARTHROPLASTY ANTERIOR APPROACH;  Surgeon: Mcarthur Rossetti, MD;  Location: [facility];  Service: Orthopedics;  Laterality: Left;  SocHx  reports that he quit smoking about 61 years ago. He has never used smokeless tobacco. He reports that he does not drink alcohol and does not use drugs.  Allergies  Allergen Reactions  . Actos [Pioglitazone] Swelling  . Formaldehyde Other (See Comments)    Flu-like symptoms  . Lipitor [Atorvastatin Calcium] Other (See Comments)    unknown  . Metformin And Related Other (See Comments)    unknown    FamHx Family History  Problem Relation Age of Onset  . Diabetes Mother   . Diabetes Sister   . Diabetes Brother   . Heart attack Neg Hx   . Stroke Neg Hx   .  Hypertension Neg Hx     Prior to Admission medications   Medication Sig Start Date End Date Taking? Authorizing Provider  albuterol (PROVENTIL) (2.5 MG/3ML) 0.083% nebulizer solution Take 2.5 mg by nebulization every 6 (six) hours as needed for wheezing or shortness of breath.   Yes [provider]  aspirin EC 81 MG tablet Take 81 mg by mouth daily.   Yes [provider]  Calcium Carbonate-Vitamin D (CALCIUM 600+D PO) Take 1 tablet by mouth daily.   Yes [provider]  docusate sodium (COLACE) 100 MG capsule Take 100 mg by mouth at bedtime.   Yes [provider]  fluticasone (FLONASE) 50 MCG/ACT nasal spray Place 2 sprays into both nostrils 2 (two) times daily. 11/20/18  Yes Arrien, Jimmy Picket, MD  gabapentin (NEURONTIN) 100 MG capsule Take 300 mg by mouth at bedtime.    Yes [provider]  insulin aspart (NOVOLOG FLEXPEN) 100 UNIT/ML FlexPen Before each meal 3 times a day, 140-199 - 2 units, 200-250 - 4 units, 251-299 - 6 units,  300-349 - 8 units,  350 or above 10 units. Patient taking differently: Inject 2-10 Units into the skin See admin instructions. Per sliding scale before each meal 3 times a day, 140-199 - 2 units, 200-250 - 4 units, 251-299 - 6 units,  300-349 - 8 units,  350 or above 10 units. 02/28/15  Yes Florencia Reasons, MD  Insulin Glargine (LANTUS) 100 UNIT/ML Solostar Pen Inject 15 Units into the skin daily at 10 pm.   Yes [provider]  lisinopril (PRINIVIL,ZESTRIL) 5 MG tablet Take 1 tablet (5 mg total) by mouth daily. 12/26/18  Yes Patwardhan, Manish J, MD  omeprazole (PRILOSEC) 20 MG capsule Take 20 mg by mouth daily.  10/20/16  Yes [provider]  rosuvastatin (CRESTOR) 10 MG tablet TAKE 1 TABLET AT BEDTIME Patient taking differently: Take 10 mg by mouth at bedtime.  03/26/20  Yes Adrian Prows, MD  sodium chloride (OCEAN) 0.65 % SOLN nasal spray Place 1 spray into both nostrils as needed for congestion. 11/20/18  Yes Arrien,  Jimmy Picket, MD  tetrahydrozoline-zinc (VISINE-AC) 0.05-0.25 % ophthalmic solution Place 2 drops into both eyes 3 (three) times daily as needed (dry eyes).   Yes [provider]    Physical Exam: Vitals:   06/27/20 1149 06/27/20 1216 06/27/20 1217 06/27/20 1218  BP:  97/64    Pulse:  85    Resp:  (!) 21    Temp:      TempSrc:      SpO2: 99% 91% (!) 89% 90%  Weight:      Height:        General: 86 y.o. male resting in bed in NAD Eyes: PERRL, normal sclera ENMT: Nares patent w/o discharge, orophaynx clear, dentition normal, ears  w/o discharge/lesions/ulcers Neck: Supple, trachea midline Cardiovascular: RRR, +S1, S2, no m/g/r, equal pulses throughout Respiratory: decreased right base, no w/r/r, slightly increased WOB on RA, 2L [place] replaced after exam GI: BS+, NDNT, no masses noted, no organomegaly noted MSK: No e/c/c Skin: No rashes, bruises, ulcerations noted Neuro: A&O x 3, hard of hearing otherwise, no focal deficits Psyc: Appropriate interaction and affect, calm/cooperative  Labs on Admission: I have personally reviewed following labs and imaging studies  CBC: Recent Labs  Lab 06/27/20 0900  WBC 9.4  NEUTROABS 8.0*  HGB 14.4  HCT 43.1  MCV 93.3  PLT 427   Basic Metabolic Panel: Recent Labs  Lab 06/27/20 0900  NA 141  K 3.8  CL 101  CO2 27  GLUCOSE 219*  BUN 14  CREATININE 1.02  CALCIUM 9.5   GFR: Estimated Creatinine Clearance: 47.5 mL/min (by C-G formula based on SCr of 1.02 mg/dL). Liver Function Tests: Recent Labs  Lab 06/27/20 0900  AST 20  ALT 18  ALKPHOS 69  BILITOT 0.9  PROT 7.2  ALBUMIN 3.9   No results for input(s): LIPASE, AMYLASE in the last 168 hours. No results for input(s): AMMONIA in the last 168 hours. Coagulation Profile: Recent Labs  Lab 06/27/20 0900  INR 1.0   Cardiac Enzymes: No results for input(s): CKTOTAL, CKMB, CKMBINDEX, TROPONINI in the last 168 hours. BNP (last 3 results) No results for input(s):  PROBNP in the last 8760 hours. HbA1C: No results for input(s): HGBA1C in the last 72 hours. CBG: No results for input(s): GLUCAP in the last 168 hours. Lipid Profile: No results for input(s): CHOL, HDL, LDLCALC, TRIG, CHOLHDL, LDLDIRECT in the last 72 hours. Thyroid Function Tests: No results for input(s): TSH, T4TOTAL, FREET4, T3FREE, THYROIDAB in the last 72 hours. Anemia Panel: No results for input(s): VITAMINB12, FOLATE, FERRITIN, TIBC, IRON, RETICCTPCT in the last 72 hours. Urine analysis:    Component Value Date/Time   COLORURINE STRAW (A) 06/27/2020 0900   APPEARANCEUR CLEAR 06/27/2020 0900   LABSPEC 1.008 06/27/2020 0900   PHURINE 6.0 06/27/2020 0900   GLUCOSEU 50 (A) 06/27/2020 0900   HGBUR SMALL (A) 06/27/2020 0900   BILIRUBINUR NEGATIVE 06/27/2020 0900   KETONESUR NEGATIVE 06/27/2020 0900   PROTEINUR 30 (A) 06/27/2020 0900   UROBILINOGEN 0.2 02/27/2015 1536   NITRITE NEGATIVE 06/27/2020 0900   LEUKOCYTESUR NEGATIVE 06/27/2020 0900    Radiological Exams on Admission: DG Chest Port 1 View  Result Date: 06/27/2020 CLINICAL DATA:  Questionable sepsis, evaluate for abnormality EXAM: PORTABLE CHEST 1 VIEW COMPARISON:  03/14/2020 FINDINGS: Mild cardiomegaly. There is dense, heterogeneous airspace opacity of the right midlung, new compared to prior examination. The visualized skeletal structures are unremarkable. IMPRESSION: 1. There is dense, heterogeneous airspace opacity of the right midlung, new compared to prior examination, concerning for infection. Recommend follow-up radiographs in 6-8 weeks to ensure complete radiographic resolution. 2. Mild cardiomegaly. Electronically Signed   By: Eddie Candle M.D.   On: 06/27/2020 09:27    EKG: Independently reviewed. No ST elevations; prolonged PR noted  Assessment/Plan Sepsis Aspiration PNA     - sepsis criteria: temp 104.2, RR: 24, lactic acid 2.1, source: PNA     - admit to inpt telemetry     - received zithro, rocephin in  ED; spoke with pharmacy and apparently no guidelines say this is ok for continued use in aspiration; will continue for now     - sepsis protocol; has received 2.5L IVF in ED, will run at  a rate of 125cc/hr; follow I&O  Elevated troponin Hx of bradycardia     - trp: 80 -> 483     - EKG w/o ST changes     - prolonged PR noted     - cardiology consulted by ED; appreciate their assistance     - echo ordered; trend trp  Hemoptysis     - denies Hx of CA     - check CTA PE; follow  IDDM DM neuropathy     - resume home insulin; place on DM diet, SSI, glucose checks     - continue home neurontin  GERD     - PPI  Hx of HTN     - hold home lisinopril d/t soft BP, follow  HLD     - continue home crestor  DVT prophylaxis: SCDs  Code Status: DNR (confirmed w/ wife)  Family Communication: Spoke with wife by phone.   Consults called: Cardiology called by EDP  Admission status: Inpatient d/t severity of illness  Status is: Inpatient  Remains inpatient appropriate because:Inpatient level of care appropriate due to severity of illness   Dispo: The patient is from: Home              Anticipated d/c is to: Home              Anticipated d/c date is: 2 days              Patient currently is not medically stable to d/c.  Time spent coordinating admission: 75 minutes  [facility]  If 7PM-7AM, please contact night-coverage [email or web address]  06/27/2020, 12:57 PM

## 2020-06-27 NOTE — ED Provider Notes (Signed)
[facility] DEPT Provider Note   CSN: [id number] Arrival date & time: 06/27/20  1751     History Chief Complaint  Patient presents with  . Shortness of Breath  . Hemoptysis  . Hypoxia    [name] is a 86 y.o. male.  HPI Patient presents for evaluation of fever and cough with hemoptysis.  He was found to be hypoxic by EMS and placed on oxygen at 15 L by facemask.  It is reported that his room air oxygenation was 75%, prior to being given oxygen.  The patient is unable to give any history.  Level 5 caveat-altered mental status    Past Medical History:  Diagnosis Date  . Aortic stenosis    Echo 3/18: mild aortic stenosis (mean 12/peak 22 // Echo 3/19: mild AS (mean 12, peak 20) // Echo 02/2019: EF 02-58, grade 1 diastolic dysfunction, mild MAC, mild aortic stenosis (mean gradient 9), mild dilation of the ascending aorta (38 mm)   . Atherosclerosis of aorta ([place]) 12/16/2016   CXR 6/16: IMPRESSION: 1. Resolved left basilar airspace opacities. 2. Hazy nodularity in both lungs compatible with the patient's known pleural plaques. 3. Right anterior hemidiaphragmatic eventration. 4. Thoracic spondylosis. 5. Atherosclerotic calcification of the aortic arch.  . Bradycardia    Low HR (30s) noted during sleep during admx for sepsis in 2016; [place] 1, 2:1 block >> no indication for pacer; management of OSA recommended // transient CHB noted on ECG during admit in 10/2018   . Diabetes mellitus without complication ([place])   . Diabetic neuropathy ([place])   . DJD (degenerative joint disease)    knee, hip  . Echocardiogram    Echo 6/16: mod LVH, EF 65-70, vigorous LVF, no RWMA, mild MR, mild LAE // Echo 3/18: EF 60-65, Gr 1 DD, mild AS, PASP 47 // Echo 3/19: EF 60-65, Gr 1 DD, mild AS (mean 12) // Echo 7/19: EF 55-60, Gr 1 DD, PASP 36  . GERD (gastroesophageal reflux disease)   . Heart murmur   . HLD (hyperlipidemia)   . Hypertension   . Hyperthyroidism   .  Lexiscan Myoview 06/2018   Normal perfusion, EF 81; Low Risk  . Mild diastolic dysfunction 52/77/8242   Noted on Echocardiogram; Patient does not require diuretic Rx  . Phlebitis of left leg   . Pneumonia 02/2015   admx with sepsis     Patient Active Problem List   Diagnosis Date Noted  . Sepsis ([place]) 06/27/2020  . Aspiration into airway 01/22/2019  . Essential hypertension 11/18/2018  . Dyslipidemia 11/18/2018  . Chronic diastolic CHF (congestive heart failure) ([place]) 11/18/2018  . Status post total replacement of left hip 09/24/2017  . Pain in left hip 08/11/2017  . Unilateral primary osteoarthritis, left hip 08/11/2017  . Bradycardia 12/16/2016  . Atherosclerosis of aorta ([place]) 12/16/2016  . Aortic stenosis   . Severe sepsis ([employer]) 02/25/2015  . Bilateral pneumonia 02/25/2015  . Lactic acidosis 02/25/2015  . Hypoxia 02/25/2015  . Sepsis due to pneumonia ([place]) 02/25/2015  . Acute on chronic respiratory failure with hypoxia ([place])   . Multifocal pneumonia   . SOB (shortness of breath)   . Type 2 diabetes mellitus with hyperglycemia [facility])     Past Surgical History:  Procedure Laterality Date  . CATARACT EXTRACTION Right   . CIRCUMCISION    . FRACTURE SURGERY    . TOTAL HIP ARTHROPLASTY Left 09/24/2017   Procedure: LEFT TOTAL HIP ARTHROPLASTY ANTERIOR APPROACH;  Surgeon:  Mcarthur Rossetti, MD;  Location: [facility] ORS;  Service: Orthopedics;  Laterality: Left;       Family History  Problem Relation Age of Onset  . Diabetes Mother   . Diabetes Sister   . Diabetes Brother   . Heart attack Neg Hx   . Stroke Neg Hx   . Hypertension Neg Hx     Social History   Tobacco Use  . Smoking status: Former Smoker    Quit date: 1960    Years since quitting: 61.8  . Smokeless tobacco: Never Used  Vaping Use  . Vaping Use: Never used  Substance Use Topics  . Alcohol use: No  . Drug use: No    Home Medications Prior to Admission medications   Medication Sig Start Date End  Date Taking? Authorizing Provider  albuterol (PROVENTIL) (2.5 MG/3ML) 0.083% nebulizer solution Take 2.5 mg by nebulization every 6 (six) hours as needed for wheezing or shortness of breath.   Yes [provider]  aspirin EC 81 MG tablet Take 81 mg by mouth daily.   Yes [provider]  Calcium Carbonate-Vitamin D (CALCIUM 600+D PO) Take 1 tablet by mouth daily.   Yes [provider]  docusate sodium (COLACE) 100 MG capsule Take 100 mg by mouth at bedtime.   Yes [provider]  fluticasone (FLONASE) 50 MCG/ACT nasal spray Place 2 sprays into both nostrils 2 (two) times daily. 11/20/18  Yes Arrien, Jimmy Picket, MD  gabapentin (NEURONTIN) 100 MG capsule Take 300 mg by mouth at bedtime.    Yes [provider]  insulin aspart (NOVOLOG FLEXPEN) 100 UNIT/ML FlexPen Before each meal 3 times a day, 140-199 - 2 units, 200-250 - 4 units, 251-299 - 6 units,  300-349 - 8 units,  350 or above 10 units. Patient taking differently: Inject 2-10 Units into the skin See admin instructions. Per sliding scale before each meal 3 times a day, 140-199 - 2 units, 200-250 - 4 units, 251-299 - 6 units,  300-349 - 8 units,  350 or above 10 units. 02/28/15  Yes Florencia Reasons, MD  Insulin Glargine (LANTUS) 100 UNIT/ML Solostar Pen Inject 15 Units into the skin daily at 10 pm.   Yes [provider]  lisinopril (PRINIVIL,ZESTRIL) 5 MG tablet Take 1 tablet (5 mg total) by mouth daily. 12/26/18  Yes Patwardhan, Manish J, MD  omeprazole (PRILOSEC) 20 MG capsule Take 20 mg by mouth daily.  10/20/16  Yes [provider]  rosuvastatin (CRESTOR) 10 MG tablet TAKE 1 TABLET AT BEDTIME Patient taking differently: Take 10 mg by mouth at bedtime.  03/26/20  Yes Adrian Prows, MD  sodium chloride (OCEAN) 0.65 % SOLN nasal spray Place 1 spray into both nostrils as needed for congestion. 11/20/18  Yes Arrien, Jimmy Picket, MD  tetrahydrozoline-zinc (VISINE-AC) 0.05-0.25 % ophthalmic solution  Place 2 drops into both eyes 3 (three) times daily as needed (dry eyes).   Yes [provider]    Allergies    Actos [pioglitazone], Formaldehyde, Lipitor [atorvastatin calcium], and Metformin and related  Review of Systems   Review of Systems  All other systems reviewed and are negative.   Physical Exam Updated Vital Signs BP 97/64   Pulse 85   Temp 98.2 F (36.8 C) (Oral)   Resp (!) 21   Ht _0  (1.727 m)   Wt 79.4 kg   SpO2 90%   BMI 26.61 kg/m   Physical Exam Vitals and nursing note reviewed.  Constitutional:  General: He is not in acute distress.    Appearance: He is well-developed. He is not ill-appearing, toxic-appearing or diaphoretic.  HENT:     Head: Normocephalic and atraumatic.     Right Ear: External ear normal.     Left Ear: External ear normal.     Nose: No congestion or rhinorrhea.     Mouth/Throat:     Mouth: Mucous membranes are moist.  Eyes:     Conjunctiva/sclera: Conjunctivae normal.     Pupils: Pupils are equal, round, and reactive to light.  Neck:     Trachea: Phonation normal.  Cardiovascular:     Rate and Rhythm: Normal rate and regular rhythm.     Heart sounds: Normal heart sounds.  Pulmonary:     Effort: Pulmonary effort is normal. No respiratory distress.     Breath sounds: No stridor.     Comments: During examination he is expectorating thick clear sputum with blood in it. Chest:     Chest wall: No tenderness.  Abdominal:     General: There is no distension.     Palpations: Abdomen is soft.     Tenderness: There is no abdominal tenderness.  Musculoskeletal:        General: No swelling. Normal range of motion.     Cervical back: Normal range of motion and neck supple.  Skin:    General: Skin is warm and dry.     Coloration: Skin is not jaundiced or pale.  Neurological:     Mental Status: He is alert.     Cranial Nerves: No cranial nerve deficit.     Motor: No abnormal muscle tone.     Coordination: Coordination  normal.     Comments: He is able to follow commands.  He is able to verbalize, but is confused.  Psychiatric:        Mood and Affect: Mood normal.        Behavior: Behavior normal.     ED Results / Procedures / Treatments   Labs (all labs ordered are listed, but only abnormal results are displayed) Labs Reviewed  LACTIC ACID, PLASMA - Abnormal; Notable for the following components:      Result Value   Lactic Acid, Venous 2.0 (*)    All other components within normal limits  LACTIC ACID, PLASMA - Abnormal; Notable for the following components:   Lactic Acid, Venous 2.1 (*)    All other components within normal limits  COMPREHENSIVE METABOLIC PANEL - Abnormal; Notable for the following components:   Glucose, Bld 219 (*)    All other components within normal limits  CBC WITH DIFFERENTIAL/PLATELET - Abnormal; Notable for the following components:   Neutro Abs 8.0 (*)    Lymphs Abs 0.5 (*)    All other components within normal limits  URINALYSIS, ROUTINE W REFLEX MICROSCOPIC - Abnormal; Notable for the following components:   Color, Urine STRAW (*)    Glucose, UA 50 (*)    Hgb urine dipstick SMALL (*)    Protein, ur 30 (*)    Bacteria, UA RARE (*)    All other components within normal limits  TROPONIN I (HIGH SENSITIVITY) - Abnormal; Notable for the following components:   Troponin I (High Sensitivity) 80 (*)    All other components within normal limits  TROPONIN I (HIGH SENSITIVITY) - Abnormal; Notable for the following components:   Troponin I (High Sensitivity) 483 (*)    All other components within normal limits  EXPECTORATED SPUTUM  ASSESSMENT W REFEX TO RESP CULTURE  RESPIRATORY PANEL BY RT PCR (FLU A&B, COVID)  CULTURE, BLOOD (ROUTINE X 2)  CULTURE, BLOOD (ROUTINE X 2)  URINE CULTURE  CULTURE, RESPIRATORY  PROTIME-INR  APTT    EKG EKG Interpretation  Date/Time:  Thursday June 27 2020 09:34:10 EDT Ventricular Rate:  92 PR Interval:    QRS Duration: 93 QT  Interval:  338 QTC Calculation: 419 R Axis:   -87 Text Interpretation: Sinus or ectopic atrial rhythm Sinus pause Borderline prolonged PR interval Left anterior fascicular block Consider anterior infarct Since last tracing ST abnormality resolved Otherwise no significant change Confirmed by Daleen Bo [phone]) on 06/27/2020 11:49:21 AM   Radiology DG Chest Port 1 View  Result Date: 06/27/2020 CLINICAL DATA:  Questionable sepsis, evaluate for abnormality EXAM: PORTABLE CHEST 1 VIEW COMPARISON:  03/14/2020 FINDINGS: Mild cardiomegaly. There is dense, heterogeneous airspace opacity of the right midlung, new compared to prior examination. The visualized skeletal structures are unremarkable. IMPRESSION: 1. There is dense, heterogeneous airspace opacity of the right midlung, new compared to prior examination, concerning for infection. Recommend follow-up radiographs in 6-8 weeks to ensure complete radiographic resolution. 2. Mild cardiomegaly. Electronically Signed   By: Eddie Candle M.D.   On: 06/27/2020 09:27    Procedures .Critical Care Performed by: Daleen Bo, MD Authorized by: Daleen Bo, MD   Critical care provider statement:    Critical care time (minutes):  55   Critical care start time:  06/27/2020 8:40 AM   Critical care end time:  06/27/2020 12:13 PM   Critical care time was exclusive of:  Separately billable procedures and treating other patients   Critical care was necessary to treat or prevent imminent or life-threatening deterioration of the following conditions:  Respiratory failure   Critical care was time spent personally by me on the following activities:  Blood draw for specimens, development of treatment plan with patient or surrogate, discussions with consultants, evaluation of patient's response to treatment, examination of patient, obtaining history from patient or surrogate, ordering and performing treatments and interventions, ordering and review of laboratory  studies, pulse oximetry, re-evaluation of patient's condition, review of old charts and ordering and review of radiographic studies   (including critical care time)  Medications Ordered in ED Medications  lactated ringers infusion ( Intravenous New Bag/Given 06/27/20 1102)  cefTRIAXone (ROCEPHIN) 2 g in sodium chloride 0.9 % 100 mL IVPB (0 g Intravenous Stopped 06/27/20 1002)  azithromycin (ZITHROMAX) 500 mg in sodium chloride 0.9 % 250 mL IVPB (0 mg Intravenous Stopped 06/27/20 1028)  lactated ringers bolus 1,000 mL (0 mLs Intravenous Stopped 06/27/20 1032)    And  lactated ringers bolus 1,000 mL (0 mLs Intravenous Stopped 06/27/20 1101)    And  lactated ringers bolus 500 mL (0 mLs Intravenous Stopped 06/27/20 1101)  acetaminophen (TYLENOL) tablet 1,000 mg (1,000 mg Oral Given 06/27/20 0045)    ED Course  I have reviewed the triage vital signs and the nursing notes.  Pertinent labs & imaging results that were available during my care of the patient were reviewed by me and considered in my medical decision making (see chart for details).  Clinical Course as of Jun 28 1219  Thu Jun 27, 2020  0929 Per wife on phone: This morning he was choking and had a fever. He took some Robitussin this morning. Recently he has looked flushed and didn't feel good. He was fine last night, but took a shower because he was itching on his  abdomen.    [EW]  1145 Abnormal, high  Troponin I (High Sensitivity)(!) [EW]  1146 Normal except glucose elevated   [EW]  1146 Normal   [EW]  1146 Normal  Respiratory Panel by RT PCR (Flu A&B, Covid) - Nasopharyngeal Swab [EW]  1146 Normal except presence of glucose, hemoglobin, protein with bacteria  Urinalysis, Routine w reflex microscopic Urine, Catheterized(!) [EW]  1147 Nonspecific opacity, right midlung, pneumonia versus cancer.  Interpreted by me.  DG Chest Port 1 View [EW]  1149 MCV: 93.3 [EW]  1219 Initial Troponin elevated. Repeat higher, 450. He remains CP  free. Discussed with hospitalist. [facility].   [EW]    Clinical Course User Index [EW] Daleen Bo, MD   MDM Rules/Calculators/A&P                           Patient Vitals for the past 24 hrs:  BP Temp Temp src Pulse Resp SpO2 Height Weight  06/27/20 1218 -- -- -- -- -- 90 % -- --  06/27/20 1217 -- -- -- -- -- (!) 89 % -- --  06/27/20 1216 97/64 -- -- 85 (!) 21 91 % -- --  06/27/20 1149 -- -- -- -- -- 99 % -- --  06/27/20 1147 -- -- -- -- -- 95 % -- --  06/27/20 1145 (!) 134/59 -- -- 88 (!) 26 100 % -- --  06/27/20 1130 130/65 -- -- 87 (!) 27 98 % -- --  06/27/20 1115 (!) 124/56 -- -- 84 (!) 24 100 % -- --  06/27/20 1100 (!) 141/59 -- -- 84 (!) 31 99 % -- --  06/27/20 1045 (!) 135/57 -- -- 80 (!) 22 99 % -- --  06/27/20 1040 -- -- -- -- -- 91 % -- --  06/27/20 1032 -- 98.2 F (36.8 C) Oral -- -- -- -- --  06/27/20 1030 (!) 134/52 -- -- 74 (!) 24 94 % -- --  06/27/20 1028 -- -- -- -- -- (!) 87 % -- --  06/27/20 1015 (!) 126/49 -- -- (!) 30 (!) 26 98 % -- --  06/27/20 1004 -- -- -- -- -- 97 % -- --  06/27/20 1000 132/62 -- -- 74 (!) 25 100 % -- --  06/27/20 0945 (!) 142/58 -- -- 90 (!) 26 98 % -- --  06/27/20 0935 -- -- -- -- -- -- _0  (1.727 m) 79.4 kg  06/27/20 0930 135/62 -- -- 96 14 96 % -- --  06/27/20 0900 (!) 163/57 -- -- (!) 59 (!) 24 100 % -- --  06/27/20 0854 (!) 166/61 -- -- (!) 59 (!) 25 90 % -- --  06/27/20 0853 -- (!) 104.2 F (40.1 C) Rectal -- -- -- -- --    12:06 PM Reevaluation with update and discussion. After initial assessment and treatment, an updated evaluation reveals he is more alert and comfortable. O2 sat 95 % on room air. Daleen Bo   Medical Decision Making:  This patient is presenting for evaluation of fever, which does require a range of treatment options, and is a complaint that involves a high risk of morbidity and mortality. The differential diagnoses include sepsis, PNE, UTI, Covid. I decided to review old records, and in  summary elderly male with recurrent symptoms, cough with hemoptysis, presenting now with high fever.  I obtained additional historical information from his wife at the bedside.  Clinical Laboratory Tests Ordered, included Troponin, lactate.  Review indicates initial troponin elevated.  Urinalysis not indicative of UTI.  Covid test negative.  Flu test negative.  Chemistry panel normal except glucose mildly elevated.  CBC normal.. Radiologic Tests Ordered, included chest x-ray.  I independently Visualized: Radiographic images, which show a pneumonia versus mass  Cardiac Monitor Tracing which shows normal sinus rhythm   Specimen of sputum indicating bleeding.   Critical Interventions-clinical evaluation, laboratory testing, radiography, empiric treatment for sepsis and pneumonia, observation reassessment.  Patient removed from oxygen and did not immediately desaturate.  Oxygenation gradually decreased to 91%.  Findings discussed with hospitalist will see the patient for admission.  After These Interventions, the Patient was reevaluated and was found stable for discharge.  He needs anger management and observation.  [name] was evaluated in Emergency Department on 06/27/2020 for the symptoms described in the history of present illness. He was evaluated in the context of the global COVID-19 pandemic, which necessitated consideration that the patient might be at risk for infection with the SARS-CoV-2 virus that causes COVID-19. Institutional protocols and algorithms that pertain to the evaluation of patients at risk for COVID-19 are in a state of rapid change based on information released by regulatory bodies including the CDC and federal and state organizations. These policies and algorithms were followed during the patient's care in the ED.   CRITICAL CARE-yes Performed by: Daleen Bo  Nursing Notes Reviewed/ Care Coordinated Applicable Imaging Reviewed Interpretation of Laboratory Data  incorporated into ED treatment  Plan admit to hospital service    Final Clinical Impression(s) / ED Diagnoses Final diagnoses:  Community acquired pneumonia of right middle lobe of lung  Elevated troponin  Hypoxia    Rx / DC Orders ED Discharge Orders    None       Daleen Bo, MD 06/27/20 1708

## 2020-06-27 NOTE — ED Notes (Signed)
Patient back from CT.

## 2020-06-27 NOTE — ED Notes (Addendum)
CRITICAL VALUE STICKER  CRITICAL VALUE: Troponin 483; Lactic acid 2.1  RECEIVER (on-site recipient of call): CSimpson, RN   DATE & TIME NOTIFIED: 06/27/20 1213  MESSENGER (representative from lab):TBillingsley  MD NOTIFIED: Effie Shy  TIME OF NOTIFICATION: 1215  RESPONSE: see orders

## 2020-06-27 NOTE — ED Notes (Signed)
X-ray at bedside

## 2020-06-28 ENCOUNTER — Encounter (HOSPITAL_COMMUNITY): Payer: Self-pay | Admitting: Internal Medicine

## 2020-06-28 ENCOUNTER — Inpatient Hospital Stay (HOSPITAL_COMMUNITY): Payer: Medicare HMO

## 2020-06-28 DIAGNOSIS — R0602 Shortness of breath: Secondary | ICD-10-CM

## 2020-06-28 DIAGNOSIS — R778 Other specified abnormalities of plasma proteins: Secondary | ICD-10-CM

## 2020-06-28 DIAGNOSIS — J690 Pneumonitis due to inhalation of food and vomit: Secondary | ICD-10-CM

## 2020-06-28 HISTORY — DX: Other specified abnormalities of plasma proteins: R77.8

## 2020-06-28 LAB — COMPREHENSIVE METABOLIC PANEL
ALT: 14 U/L (ref 0–44)
AST: 15 U/L (ref 15–41)
Albumin: 3.2 g/dL — ABNORMAL LOW (ref 3.5–5.0)
Alkaline Phosphatase: 48 U/L (ref 38–126)
Anion gap: 11 (ref 5–15)
BUN: 14 mg/dL (ref 8–23)
CO2: 28 mmol/L (ref 22–32)
Calcium: 9.2 mg/dL (ref 8.9–10.3)
Chloride: 100 mmol/L (ref 98–111)
Creatinine, Ser: 0.96 mg/dL (ref 0.61–1.24)
GFR calc non Af Amer: >60 mL/min (ref >60)
Glucose, Bld: 138 mg/dL — ABNORMAL HIGH (ref 70–99)
Potassium: 3.6 mmol/L (ref 3.5–5.1)
Sodium: 139 mmol/L (ref 135–145)
Total Bilirubin: 0.8 mg/dL (ref 0.3–1.2)
Total Protein: 6 g/dL — ABNORMAL LOW (ref 6.5–8.1)

## 2020-06-28 LAB — ECHOCARDIOGRAM COMPLETE
Calc EF: 66.3 %
Height: 68 in
S' Lateral: 3.16 cm
Single Plane A2C EF: 59.6 %
Single Plane A4C EF: 74.4 %
Weight: 2800 oz

## 2020-06-28 LAB — CBC
HCT: 39.2 % (ref 39.0–52.0)
Hemoglobin: 12.8 g/dL — ABNORMAL LOW (ref 13.0–17.0)
MCH: 31.7 pg (ref 26.0–34.0)
MCHC: 32.7 g/dL (ref 30.0–36.0)
MCV: 97 fL (ref 80.0–100.0)
Platelets: 181 10*3/uL (ref 150–400)
RBC: 4.04 MIL/uL — ABNORMAL LOW (ref 4.22–5.81)
RDW: 12.9 % (ref 11.5–15.5)
WBC: 11.9 10*3/uL — ABNORMAL HIGH (ref 4.0–10.5)
nRBC: 0 % (ref 0.0–0.2)

## 2020-06-28 LAB — CORTISOL-AM, BLOOD: Cortisol - AM: 13.2 ug/dL (ref 6.7–22.6)

## 2020-06-28 LAB — GLUCOSE, CAPILLARY
Glucose-Capillary: 125 mg/dL — ABNORMAL HIGH (ref 70–99)
Glucose-Capillary: 168 mg/dL — ABNORMAL HIGH (ref 70–99)
Glucose-Capillary: 254 mg/dL — ABNORMAL HIGH (ref 70–99)
Glucose-Capillary: 294 mg/dL — ABNORMAL HIGH (ref 70–99)

## 2020-06-28 LAB — PROTIME-INR
INR: 1.2 (ref 0.8–1.2)
Prothrombin Time: 14.7 seconds (ref 11.4–15.2)

## 2020-06-28 LAB — PROCALCITONIN: Procalcitonin: 18.94 ng/mL

## 2020-06-28 LAB — URINE CULTURE: Culture: NO GROWTH

## 2020-06-28 MED ORDER — SODIUM CHLORIDE 0.9 % IV SOLN
3.0000 g | Freq: Four times a day (QID) | INTRAVENOUS | Status: DC
  Administered 2020-06-28 – 2020-07-01 (×13): 3 g via INTRAVENOUS
  Filled 2020-06-28 (×2): qty 8
  Filled 2020-06-28: qty 3
  Filled 2020-06-28: qty 8
  Filled 2020-06-28: qty 3
  Filled 2020-06-28: qty 8
  Filled 2020-06-28 (×2): qty 3
  Filled 2020-06-28 (×3): qty 8
  Filled 2020-06-28 (×3): qty 3

## 2020-06-28 MED ORDER — PERFLUTREN LIPID MICROSPHERE
1.0000 mL | INTRAVENOUS | Status: AC | PRN
  Administered 2020-06-28: 2 mL via INTRAVENOUS
  Filled 2020-06-28: qty 10

## 2020-06-28 MED ORDER — ALUM & MAG HYDROXIDE-SIMETH 200-200-20 MG/5ML PO SUSP
30.0000 mL | ORAL | Status: AC | PRN
  Administered 2020-06-28: 30 mL via ORAL
  Filled 2020-06-28: qty 30

## 2020-06-28 NOTE — Progress Notes (Signed)
  Echocardiogram 2D Echocardiogram has been performed.  [name] 06/28/2020, 9:16 AM

## 2020-06-28 NOTE — Progress Notes (Signed)
PROGRESS NOTE  Jackson [name] ION:[id number] DOB: [birth year] DOA: 06/27/2020 PCP: Marden Noble, MD   LOS: 1 day   Brief narrative: As per HPI,  Jackson [name] is a 86 y.o. male with medical history significant of DM, DM neuropathy, HLD, aortic stenosis, aspiration pneumonia presented to hospital with complaints of respiratory distress, shortness of breath chills.  Patient also had episodes of vomiting and choking followed by trouble breathing.  EMS was called in and patient was noted to be hypoxic.  Patient does have history of GERD and takes proton pump inhibitors.  ED Course: CXR showed RML opacity concerning for infection. He was initially on NRB at presentation but weaned to 2L [place]. He was started on sepsis protocol in the ED. Patient was subsequently admitted to hospital for further treatment and evaluation.  Assessment/Plan:  Active Problems:   Sepsis (HCC)  Sepsis likely secondary to aspiration pneumonia. Patient with sepsis criteria on presentation with temperature of 104.92F, respiratory rate of 24/min, lactate of 2.1 and source being pneumonia.  On Rocephin and Zithromax. CT scan showed multifocal centrally lobular groundglass nodularity and consolidation of the right lung.  Temperature max of 104.2 F.  We will change the antibiotic to Unasyn at this time to cover for aspiration pneumonia.  We will get a speech and swallow evaluation.      Elevated troponin, Hx of bradycardia Patient did have a elevated troponin and cardiology has been consulted.  Likely demand ischemia.  EKG showed prolonged PR.  Check 2D echocardiogram.  Continue aspirin low-dose   Hemoptysis No evidence of pulmonary embolism as per CT scan.  Could be secondary to pneumonia.  We will continue to monitor.  No active hemoptysis at this time.  Diabetes mellitus type 2, DM neuropathy Continue sliding scale insulin Accu-Cheks.  continue home neurontin  GERD     - PPI  Essential HTN Lisinopril on hold.   Closely monitor blood pressure.  Was soft on presentation.  Hyperlipidemia     - continue home crestor   DVT prophylaxis: SCDs Start: 06/27/20 1607   Code Status: DNR  Family Communication: I tried to call the wife on the phone but was unable to reach her  Status is: Inpatient  Remains inpatient appropriate because:Unsafe d/c plan, IV treatments appropriate due to intensity of illness or inability to take PO and Inpatient level of care appropriate due to severity of illness   Dispo: The patient is from: Home              Anticipated d/c is to: Home  With home health.              Anticipated d/c date is: 2 days              Patient currently is not medically stable to d/c.   Consultants:  Cardiology  Procedures:  None  Antibiotics:  . Rocephin Zithromax , will change to unasyn  Anti-infectives (From admission, onward)   Start     Dose/Rate Route Frequency Ordered Stop   06/27/20 0915  cefTRIAXone (ROCEPHIN) 2 g in sodium chloride 0.9 % 100 mL IVPB        2 g 200 mL/hr over 30 Minutes Intravenous Every 24 hours 06/27/20 0900     06/27/20 0915  azithromycin (ZITHROMAX) 500 mg in sodium chloride 0.9 % 250 mL IVPB        500 mg 250 mL/hr over 60 Minutes Intravenous Every 24 hours 06/27/20 0900  Subjective: Today, patient was seen and examined at bedside.  Hard of hearing.  Patient was sitting at the bedside.  Denies chest pain has mild shortness of breath.  Objective: Vitals:   06/28/20 0030 06/28/20 0430  BP: (!) 143/70 (!) 143/70  Pulse: (!) 54 (!) 54  Resp:  16  Temp: 97.9 F (36.6 C) 97.9 F (36.6 C)  SpO2: 94% 94%    Intake/Output Summary (Last 24 hours) at 06/28/2020 0753 Last data filed at 06/28/2020 0700 Gross per 24 hour  Intake 5905.64 ml  Output 300 ml  Net 5605.64 ml   Filed Weights   06/27/20 0935  Weight: 79.4 kg   Body mass index is 26.61 kg/m.   Physical Exam:  GENERAL: Patient is alert awake and communicative, hard of  hearing.  Not in obvious distress.  On nasal cannula oxygen. HENT: No scleral pallor or icterus. Pupils equally reactive to light. Oral mucosa is moist NECK: is supple, no gross swelling noted. CHEST: Diminished breath sounds bilaterally. CVS: S1 and S2 heard, no murmur. Regular rate and rhythm.  ABDOMEN: Soft, non-tender, bowel sounds are present. EXTREMITIES: No edema. CNS: Cranial nerves are intact. No focal motor deficits. SKIN: warm and dry without rashes.  Data Review: I have personally reviewed the following laboratory data and studies,  CBC: Recent Labs  Lab 06/27/20 0900 06/28/20 0457  WBC 9.4 11.9*  NEUTROABS 8.0*  --   HGB 14.4 12.8*  HCT 43.1 39.2  MCV 93.3 97.0  PLT 214 181   Basic Metabolic Panel: Recent Labs  Lab 06/27/20 0900 06/28/20 0457  NA 141 139  K 3.8 3.6  CL 101 100  CO2 27 28  GLUCOSE 219* 138*  BUN 14 14  CREATININE 1.02 0.96  CALCIUM 9.5 9.2   Liver Function Tests: Recent Labs  Lab 06/27/20 0900 06/28/20 0457  AST 20 15  ALT 18 14  ALKPHOS 69 48  BILITOT 0.9 0.8  PROT 7.2 6.0*  ALBUMIN 3.9 3.2*   No results for input(s): LIPASE, AMYLASE in the last 168 hours. No results for input(s): AMMONIA in the last 168 hours. Cardiac Enzymes: No results for input(s): CKTOTAL, CKMB, CKMBINDEX, TROPONINI in the last 168 hours. BNP (last 3 results) No results for input(s): BNP in the last 8760 hours.  ProBNP (last 3 results) No results for input(s): PROBNP in the last 8760 hours.  CBG: Recent Labs  Lab 06/27/20 1510 06/27/20 1632 06/27/20 2120 06/28/20 0731  GLUCAP 112* 141* 162* 125*   Recent Results (from the past 240 hour(s))  Blood Culture (routine x 2)     Status: None (Preliminary result)   Collection Time: 06/27/20  9:00 AM   Specimen: BLOOD  Result Value Ref Range Status   Specimen Description   Final    BLOOD LEFT WRIST Performed at [facility], [address], [place], [place] [address]    Special  Requests   Final    BOTTLES DRAWN AEROBIC AND ANAEROBIC Blood Culture results may not be optimal due to an excessive volume of blood received in culture bottles Performed at [facility], [address], [place], [place] [address]    Culture   Final    NO GROWTH < 24 HOURS Performed at [facility], [address], [place], [place] [address]    Report Status PENDING  Incomplete  Blood Culture (routine x 2)     Status: None (Preliminary result)   Collection Time: 06/27/20  9:04 AM   Specimen: BLOOD RIGHT HAND  Result Value Ref Range Status   Specimen Description   Final    BLOOD RIGHT HAND Performed at [facility], [address], [place], [place] [address]    Special Requests   Final    BOTTLES DRAWN AEROBIC AND ANAEROBIC Blood Culture results may not be optimal due to an excessive volume of blood received in culture bottles Performed at [facility], [address] [place], [place] [address]    Culture   Final    NO GROWTH < 24 HOURS Performed at [facility], [address], [place], [place] [address]    Report Status PENDING  Incomplete  Respiratory Panel by RT PCR (Flu A&B, Covid) - Nasopharyngeal Swab     Status: None   Collection Time: 06/27/20  9:31 AM   Specimen: Nasopharyngeal Swab  Result Value Ref Range Status   SARS Coronavirus 2 by RT PCR NEGATIVE NEGATIVE Final    Comment: (NOTE) SARS-CoV-2 target nucleic acids are NOT DETECTED.  The SARS-CoV-2 RNA is generally detectable in upper respiratoy specimens during the acute phase of infection. The lowest concentration of SARS-CoV-2 viral copies this assay can detect is 131 copies/mL. A negative result does not preclude SARS-Cov-2 infection and should not be used as the sole basis for treatment or other patient management decisions. A negative result may occur with  improper specimen collection/handling, submission of specimen other than  nasopharyngeal swab, presence of viral mutation(s) within the areas targeted by this assay, and inadequate number of viral copies (<131 copies/mL). A negative result must be combined with clinical observations, patient history, and epidemiological information. The expected result is Negative.  Fact Sheet for Patients:  https://www.moore.com/  Fact Sheet for Healthcare Providers:  https://www.young.biz/  This test is no t yet approved or cleared by the [place] FDA and  has been authorized for detection and/or diagnosis of SARS-CoV-2 by FDA under an Emergency Use Authorization (EUA). This EUA will remain  in effect (meaning this test can be used) for the duration of the COVID-19 declaration under Section 564(b)(1) of the Act, 21 U.S.C. section 360bbb-3(b)(1), unless the authorization is terminated or revoked sooner.     Influenza A by PCR NEGATIVE NEGATIVE Final   Influenza B by PCR NEGATIVE NEGATIVE Final    Comment: (NOTE) The Xpert Xpress SARS-CoV-2/FLU/RSV assay is intended as an aid in  the diagnosis of influenza from Nasopharyngeal swab specimens and  should not be used as a sole basis for treatment. Nasal washings and  aspirates are unacceptable for Xpert Xpress SARS-CoV-2/FLU/RSV  testing.  Fact Sheet for Patients: https://www.moore.com/  Fact Sheet for Healthcare Providers: https://www.young.biz/  This test is not yet approved or cleared by the [place] FDA and  has been authorized for detection and/or diagnosis of SARS-CoV-2 by  FDA under an Emergency Use Authorization (EUA). This EUA will remain  in effect (meaning this test can be used) for the duration of the  Covid-19 declaration under Section 564(b)(1) of the Act, 21  U.S.C. section 360bbb-3(b)(1), unless the authorization is  terminated or revoked. Performed at [facility], [address] [place], [place] [address]   Expectorated sputum assessment w rflx to resp cult     Status: None   Collection Time: 06/27/20 10:10 AM   Specimen: Expectorated Sputum  Result Value Ref Range Status   Specimen Description EXPECTORATED SPUTUM  Final   Special Requests NONE  Final   Sputum evaluation   Final    THIS SPECIMEN IS ACCEPTABLE FOR SPUTUM CULTURE Performed at [facility], [address], [place], [place] [address]    Report Status 06/27/2020 FINAL  Final  Culture, respiratory     Status: None (Preliminary result)   Collection Time: 06/27/20 10:10 AM  Result Value Ref Range Status   Specimen Description   Final    EXPECTORATED SPUTUM Performed at [facility], [address], [place], [place] [address]    Special Requests   Final    NONE Reflexed from [phone] Performed at [facility], [address] [place], [place] [address]    Gram Stain   Final    MODERATE WBC PRESENT,BOTH PMN AND MONONUCLEAR ABUNDANT GRAM POSITIVE COCCI FEW GRAM NEGATIVE RODS FEW GRAM VARIABLE ROD Performed at [facility], [address] [place], [place] [address]    Culture PENDING  Incomplete   Report Status PENDING  Incomplete     Studies: CT ANGIO CHEST PE W OR WO CONTRAST  Result Date: 06/27/2020 CLINICAL DATA:  Several short of breath.  History of aspiration EXAM: CT ANGIOGRAPHY CHEST WITH CONTRAST TECHNIQUE: Multidetector CT imaging of the chest was performed using the standard protocol during bolus administration of intravenous contrast. Multiplanar CT image reconstructions and MIPs were obtained to evaluate the vascular anatomy. CONTRAST:  OMNIPAQUE IOHEXOL 350 MG/ML SOLN COMPARISON:  March 14, 2020 FINDINGS: Cardiovascular: Evaluation of the subsegmental pulmonary arteries is limited secondary to respiratory motion. No pulmonary embolism through the segmental pulmonary arteries. Heart is normal in size. Predominately  LEFT-sided atherosclerotic calcifications of the coronary arteries. Moderate atherosclerotic calcifications of the aorta. No pericardial effusion. Calcifications of the aortic valve leaflets. Mediastinum/Nodes: Moderate to large hiatal hernia, similar in comparison to prior. No suspicious axillary adenopathy. Similar appearance of mildly prominent mediastinal lymph nodes. Subcentimeter LEFT thyroid nodule. Lungs/Pleura: No pleural effusion or pneumothorax. There is centrilobular ground-glass nodularity and consolidative opacity throughout the RIGHT upper lobe, RIGHT middle lobe and RIGHT lower lobe in a perihilar predominant distribution. Resolution of previously seen contralateral centrilobular nodularity. Scattered pleural plaques. Scattered LEFT basilar atelectasis. Evaluation for fine parenchymal detail is limited secondary to extensive respiratory motion. Upper Abdomen: No acute abnormality. Musculoskeletal: Gynecomastia. Degenerative changes of the thoracic spine. Review of the MIP images confirms the above findings. IMPRESSION: 1. No evidence of pulmonary embolism through the segmental pulmonary arteries. 2. Multifocal centrilobular ground-glass nodularity and consolidative opacity throughout the RIGHT lung, most consistent with pneumonia. Recommend radiographic follow-up to resolution. 3. Moderate to large hiatal hernia. Aortic Atherosclerosis (ICD10-I70.0). Electronically Signed   By: Meda Klinefelter MD   On: 06/27/2020 13:23   DG Chest Port 1 View  Result Date: 06/27/2020 CLINICAL DATA:  Questionable sepsis, evaluate for abnormality EXAM: PORTABLE CHEST 1 VIEW COMPARISON:  03/14/2020 FINDINGS: Mild cardiomegaly. There is dense, heterogeneous airspace opacity of the right midlung, new compared to prior examination. The visualized skeletal structures are unremarkable. IMPRESSION: 1. There is dense, heterogeneous airspace opacity of the right midlung, new compared to prior examination, concerning for  infection. Recommend follow-up radiographs in 6-8 weeks to ensure complete radiographic resolution. 2. Mild cardiomegaly. Electronically Signed   By: Lauralyn Primes M.D.   On: 06/27/2020 09:27      Joycelyn Das, MD  [facility] 06/28/2020

## 2020-06-28 NOTE — Evaluation (Signed)
Clinical/Bedside Swallow Evaluation Patient Details  Name: Jackson [name] MRN: [id number] Date of Birth: 10-Aug-1929  Today's Date: 06/28/2020 Time: SLP Start Time (ACUTE ONLY): 1338 SLP Stop Time (ACUTE ONLY): 1354 SLP Time Calculation (min) (ACUTE ONLY): 16 min  Past Medical History:  Past Medical History:  Diagnosis Date  . Aortic stenosis    Echo 3/18: mild aortic stenosis (mean 12/peak 22 // Echo 3/19: mild AS (mean 12, peak 20) // Echo 02/2019: EF 25-00, grade 1 diastolic dysfunction, mild MAC, mild aortic stenosis (mean gradient 9), mild dilation of the ascending aorta (38 mm)   . Atherosclerosis of aorta ([place]) 12/16/2016   CXR 6/16: IMPRESSION: 1. Resolved left basilar airspace opacities. 2. Hazy nodularity in both lungs compatible with the patient's known pleural plaques. 3. Right anterior hemidiaphragmatic eventration. 4. Thoracic spondylosis. 5. Atherosclerotic calcification of the aortic arch.  . Bradycardia    Low HR (30s) noted during sleep during admx for sepsis in 2016; [place] 1, 2:1 block >> no indication for pacer; management of OSA recommended // transient CHB noted on ECG during admit in 10/2018   . Diabetes mellitus without complication ([place])   . Diabetic neuropathy ([place])   . DJD (degenerative joint disease)    knee, hip  . Echocardiogram    Echo 6/16: mod LVH, EF 65-70, vigorous LVF, no RWMA, mild MR, mild LAE // Echo 3/18: EF 60-65, Gr 1 DD, mild AS, PASP 47 // Echo 3/19: EF 60-65, Gr 1 DD, mild AS (mean 12) // Echo 7/19: EF 55-60, Gr 1 DD, PASP 36  . Elevated troponin 06/28/2020  . GERD (gastroesophageal reflux disease)   . Heart murmur   . HLD (hyperlipidemia)   . Hypertension   . Hyperthyroidism   . Lexiscan Myoview 06/2018   Normal perfusion, EF 81; Low Risk  . Mild diastolic dysfunction 37/12/8887   Noted on Echocardiogram; Patient does not require diuretic Rx  . Phlebitis of left leg   . Pneumonia 02/2015   admx with sepsis    Past Surgical History:  Past  Surgical History:  Procedure Laterality Date  . CATARACT EXTRACTION Right   . CIRCUMCISION    . FRACTURE SURGERY    . TOTAL HIP ARTHROPLASTY Left 09/24/2017   Procedure: LEFT TOTAL HIP ARTHROPLASTY ANTERIOR APPROACH;  Surgeon: Mcarthur Rossetti, MD;  Location: [facility];  Service: Orthopedics;  Laterality: Left;   HPI:   Jackson Lowdermilk [name] a 86 y.o.malewith medical history significant ofpna,GERD, DM, DM neuropathy, HLD, aortic stenosis, aspiration pneumonia presented to hospital with complaints of respiratory distress, shortness of breath chills.  Per chart atient also had episodes of vomiting and choking followed by trouble breathing. CT scan showed multifocal centrally lobular groundglass nodularity and consolidation of the right lung.  BSE 02/2020 with symptoms congruent with esophageal reflux- pt given extensive education re: reflux strategies (take Prilosec one hour begore meals not at night, raise HOB) regular/thin recommended   Assessment / Plan / Recommendation Clinical Impression  Assessment of swallow completed with wife at bedside. It appears he may be having LPR (laryngopharyngeal reflux) and began coughing (reported lots of phlegm) and likely aspirated. The larger than normal and spicy volume of fried seafood eaten at dinner may have precipitated this. Wife stated they have the written reflux precautions given by ST last admission on their refrigerator and follow. As instructed he now takes Prilosec an hour before eating in the morning. He raised his head of bed at home with blocks and this therapist  recommends buying a wedge. Also suggested smaller more frequent meals during the day and of course, avoiding spicy/fatty foods. Pt had no s/s aspiration with grapes and sips thin water. Recommended to continue regular texture, thin liquids using caution with bread and meats. Encouraged him to sit up straight if begins to cough in bed. No further ST needed at this time.     SLP Visit  Diagnosis: Dysphagia, unspecified (R13.10)    Aspiration Risk  Mild aspiration risk    Diet Recommendation Regular;Thin liquid   Liquid Administration via: Cup;Straw Medication Administration: Whole meds with liquid Supervision: Patient able to self feed Compensations: Minimize environmental distractions;Slow rate;Small sips/bites Postural Changes: Remain upright for at least 30 minutes after po intake;Seated upright at 90 degrees    Other  Recommendations Oral Care Recommendations: Oral care BID   Follow up Recommendations None      Frequency and Duration            Prognosis        Swallow Study   General HPI:  Jackson Skow [name] a 86 y.o.malewith medical history significant ofpna,GERD, DM, DM neuropathy, HLD, aortic stenosis, aspiration pneumonia presented to hospital with complaints of respiratory distress, shortness of breath chills.  Per chart atient also had episodes of vomiting and choking followed by trouble breathing. CT scan showed multifocal centrally lobular groundglass nodularity and consolidation of the right lung.  BSE 02/2020 with symptoms congruent with esophageal reflux- pt given extensive education re: reflux strategies (take Prilosec one hour begore meals not at night, raise HOB) regular/thin recommended Type of Study:  (see HPI) Diet Prior to this Study: Regular;Thin liquids Temperature Spikes Noted: No Respiratory Status: Room air History of Recent Intubation: No Behavior/Cognition: Alert;Cooperative;Pleasant mood Oral Cavity Assessment: Within Functional Limits Oral Care Completed by SLP: No Oral Cavity - Dentition: Other (Comment) (upper and lower partial) Vision: Functional for self-feeding Self-Feeding Abilities: Able to feed self Patient Positioning: Upright in bed Baseline Vocal Quality: Normal Volitional Cough: Strong Volitional Swallow: Able to elicit    Oral/Motor/Sensory Function Overall Oral Motor/Sensory Function: Within functional  limits   Ice Chips Ice chips: Not tested   Thin Liquid Thin Liquid: Within functional limits Presentation: Straw    Nectar Thick Nectar Thick Liquid: Not tested   Honey Thick Honey Thick Liquid: Not tested   Puree Puree: Not tested   Solid     Solid: Within functional limits      Mick Sell, Orbie Pyo 06/28/2020,2:12 PM  Orbie Pyo [place].Ed Risk analyst [phone] Office [phone]

## 2020-06-28 NOTE — Progress Notes (Signed)
Patient had 3-beat run of V-tach. Paged Linton Flemings.

## 2020-06-29 DIAGNOSIS — I248 Other forms of acute ischemic heart disease: Secondary | ICD-10-CM

## 2020-06-29 DIAGNOSIS — R6520 Severe sepsis without septic shock: Secondary | ICD-10-CM | POA: Diagnosis not present

## 2020-06-29 DIAGNOSIS — A419 Sepsis, unspecified organism: Secondary | ICD-10-CM | POA: Diagnosis not present

## 2020-06-29 DIAGNOSIS — J9601 Acute respiratory failure with hypoxia: Secondary | ICD-10-CM | POA: Diagnosis not present

## 2020-06-29 LAB — CULTURE, RESPIRATORY W GRAM STAIN: Culture: NORMAL

## 2020-06-29 LAB — GLUCOSE, CAPILLARY
Glucose-Capillary: 148 mg/dL — ABNORMAL HIGH (ref 70–99)
Glucose-Capillary: 275 mg/dL — ABNORMAL HIGH (ref 70–99)
Glucose-Capillary: 244 mg/dL — ABNORMAL HIGH (ref 70–99)
Glucose-Capillary: 279 mg/dL — ABNORMAL HIGH (ref 70–99)
Glucose-Capillary: 234 mg/dL — ABNORMAL HIGH (ref 70–99)

## 2020-06-29 MED ORDER — LISINOPRIL 5 MG PO TABS
5.0000 mg | ORAL_TABLET | Freq: Every day | ORAL | Status: DC
  Administered 2020-06-29 – 2020-07-01 (×3): 5 mg via ORAL
  Filled 2020-06-29 (×3): qty 1

## 2020-06-29 MED ORDER — PANTOPRAZOLE SODIUM 40 MG PO TBEC
40.0000 mg | DELAYED_RELEASE_TABLET | Freq: Every day | ORAL | Status: DC
  Administered 2020-06-29 – 2020-07-01 (×3): 40 mg via ORAL
  Filled 2020-06-29 (×3): qty 1

## 2020-06-29 NOTE — Plan of Care (Signed)
  Problem: Health Behavior/Discharge Planning: Goal: Ability to manage health-related needs will improve Outcome: Progressing   Problem: Clinical Measurements: Goal: Ability to maintain clinical measurements within normal limits will improve Outcome: Progressing Goal: Will remain free from infection Outcome: Progressing Goal: Diagnostic test results will improve Outcome: Progressing Goal: Respiratory complications will improve Outcome: Progressing Goal: Cardiovascular complication will be avoided Outcome: Progressing   Problem: Activity: Goal: Risk for activity intolerance will decrease Outcome: Progressing   Problem: Nutrition: Goal: Adequate nutrition will be maintained Outcome: Progressing   Problem: Coping: Goal: Level of anxiety will decrease Outcome: Progressing   Problem: Elimination: Goal: Will not experience complications related to bowel motility Outcome: Progressing Goal: Will not experience complications related to urinary retention Outcome: Progressing   Problem: Safety: Goal: Ability to remain free from injury will improve Outcome: Progressing   Problem: Activity: Goal: Ability to tolerate increased activity will improve Outcome: Progressing   Problem: Clinical Measurements: Goal: Ability to maintain a body temperature in the normal range will improve Outcome: Progressing   Problem: Respiratory: Goal: Ability to maintain adequate ventilation will improve Outcome: Progressing Goal: Ability to maintain a clear airway will improve Outcome: Progressing

## 2020-06-29 NOTE — Progress Notes (Signed)
Progress Note  Patient Name: Jackson [name] Date of Encounter: 06/29/2020  [facility] Cardiologist: Sherryl Manges, MD   Subjective   Feeling well.  No chest pain.  Breathing improving  Inpatient Medications    Scheduled Meds: . aspirin EC  81 mg Oral Daily  . docusate sodium  100 mg Oral QHS  . fluticasone  2 spray Each Nare Daily  . gabapentin  300 mg Oral QHS  . insulin aspart  0-15 Units Subcutaneous TID WC  . insulin aspart  0-5 Units Subcutaneous QHS  . insulin glargine  15 Units Subcutaneous Q2200  . lisinopril  5 mg Oral Daily  . pantoprazole  40 mg Oral Daily  . rosuvastatin  10 mg Oral QHS   Continuous Infusions: . ampicillin-sulbactam (UNASYN) IV Stopped (06/29/20 0534)   PRN Meds: albuterol, alum & mag hydroxide-simeth, naphazoline-glycerin, ondansetron **OR** ondansetron (ZOFRAN) IV, sodium chloride   Vital Signs    Vitals:   06/29/20 0459 06/29/20 0804 06/29/20 0811 06/29/20 0906  BP: (!) 157/58   (!) 139/57  Pulse: 78  (!) 54 88  Resp: 15   (!) 22  Temp: 98 F (36.7 C)   98.5 F (36.9 C)  TempSrc: Oral   Oral  SpO2: 91% (!) 83% 96% (!) 88%  Weight:      Height:        Intake/Output Summary (Last 24 hours) at 06/29/2020 0910 Last data filed at 06/29/2020 0800 Gross per 24 hour  Intake 1440 ml  Output 1900 ml  Net -460 ml   Last 3 Weights 06/27/2020 03/14/2020 06/20/2019  Weight (lbs) 175 lb 177 lb 11.1 oz 181 lb 1.9 oz  Weight (kg) 79.379 kg 80.6 kg 82.155 kg      Telemetry    Sinus rhythm.  First degree AV block.  Mobitz I second degree AV block.  PACs, PVCs.  - Personally Reviewed  ECG   n/a  - Personally Reviewed  Physical Exam   VS:  BP (!) 139/57 (BP Location: Right Arm)   Pulse 88   Temp 98.5 F (36.9 C) (Oral)   Resp (!) 22   Ht 5\' 8"  (1.727 m)   Wt 79.4 kg   SpO2 (!) 88%   BMI 26.61 kg/m  , BMI Body mass index is 26.61 kg/m. GENERAL:  Well appearing HEENT: Pupils equal round and reactive, fundi not visualized, oral  mucosa unremarkable NECK:  No jugular venous distention, waveform within normal limits, carotid upstroke brisk and symmetric, no bruits, no thyromegaly LYMPHATICS:  No cervical adenopathy LUNGS:  R>L basilar rhonchi HEART:  RRR.  PMI not displaced or sustained,S1 and S2 within normal limits, no S3, no S4, no clicks, no rubs, II/VI systolic murmur at LUSB ABD:  Flat, positive bowel sounds normal in frequency in pitch, no bruits, no rebound, no guarding, no midline pulsatile mass, no hepatomegaly, no splenomegaly EXT:  2 plus pulses throughout, no edema, no cyanosis no clubbing SKIN:  No rashes no nodules NEURO:  Cranial nerves II through XII grossly intact, motor grossly intact throughout PSYCH:  Cognitively intact, oriented to person place and time  Labs    High Sensitivity Troponin:   Recent Labs  Lab 06/27/20 0900 06/27/20 1100 06/27/20 1505 06/27/20 1730  TROPONINIHS 80* 483* 657* 569*      Chemistry Recent Labs  Lab 06/27/20 0900 06/28/20 0457  NA 141 139  K 3.8 3.6  CL 101 100  CO2 27 28  GLUCOSE 219* 138*  BUN 14 14  CREATININE 1.02 0.96  CALCIUM 9.5 9.2  PROT 7.2 6.0*  ALBUMIN 3.9 3.2*  AST 20 15  ALT 18 14  ALKPHOS 69 48  BILITOT 0.9 0.8  GFRNONAA >60 >60  ANIONGAP 13 11     Hematology Recent Labs  Lab 06/27/20 0900 06/28/20 0457  WBC 9.4 11.9*  RBC 4.62 4.04*  HGB 14.4 12.8*  HCT 43.1 39.2  MCV 93.3 97.0  MCH 31.2 31.7  MCHC 33.4 32.7  RDW 12.8 12.9  PLT 214 181    BNPNo results for input(s): BNP, PROBNP in the last 168 hours.   DDimer No results for input(s): DDIMER in the last 168 hours.   Radiology    CT ANGIO CHEST PE W OR WO CONTRAST  Result Date: 06/27/2020 CLINICAL DATA:  Several short of breath.  History of aspiration EXAM: CT ANGIOGRAPHY CHEST WITH CONTRAST TECHNIQUE: Multidetector CT imaging of the chest was performed using the standard protocol during bolus administration of intravenous contrast. Multiplanar CT image  reconstructions and MIPs were obtained to evaluate the vascular anatomy. CONTRAST:  OMNIPAQUE IOHEXOL 350 MG/ML SOLN COMPARISON:  March 14, 2020 FINDINGS: Cardiovascular: Evaluation of the subsegmental pulmonary arteries is limited secondary to respiratory motion. No pulmonary embolism through the segmental pulmonary arteries. Heart is normal in size. Predominately LEFT-sided atherosclerotic calcifications of the coronary arteries. Moderate atherosclerotic calcifications of the aorta. No pericardial effusion. Calcifications of the aortic valve leaflets. Mediastinum/Nodes: Moderate to large hiatal hernia, similar in comparison to prior. No suspicious axillary adenopathy. Similar appearance of mildly prominent mediastinal lymph nodes. Subcentimeter LEFT thyroid nodule. Lungs/Pleura: No pleural effusion or pneumothorax. There is centrilobular ground-glass nodularity and consolidative opacity throughout the RIGHT upper lobe, RIGHT middle lobe and RIGHT lower lobe in a perihilar predominant distribution. Resolution of previously seen contralateral centrilobular nodularity. Scattered pleural plaques. Scattered LEFT basilar atelectasis. Evaluation for fine parenchymal detail is limited secondary to extensive respiratory motion. Upper Abdomen: No acute abnormality. Musculoskeletal: Gynecomastia. Degenerative changes of the thoracic spine. Review of the MIP images confirms the above findings. IMPRESSION: 1. No evidence of pulmonary embolism through the segmental pulmonary arteries. 2. Multifocal centrilobular ground-glass nodularity and consolidative opacity throughout the RIGHT lung, most consistent with pneumonia. Recommend radiographic follow-up to resolution. 3. Moderate to large hiatal hernia. Aortic Atherosclerosis (ICD10-I70.0). Electronically Signed   By: Meda Klinefelter MD   On: 06/27/2020 13:23   DG Chest Port 1 View  Result Date: 06/27/2020 CLINICAL DATA:  Questionable sepsis, evaluate for abnormality  EXAM: PORTABLE CHEST 1 VIEW COMPARISON:  03/14/2020 FINDINGS: Mild cardiomegaly. There is dense, heterogeneous airspace opacity of the right midlung, new compared to prior examination. The visualized skeletal structures are unremarkable. IMPRESSION: 1. There is dense, heterogeneous airspace opacity of the right midlung, new compared to prior examination, concerning for infection. Recommend follow-up radiographs in 6-8 weeks to ensure complete radiographic resolution. 2. Mild cardiomegaly. Electronically Signed   By: Lauralyn Primes M.D.   On: 06/27/2020 09:27   ECHOCARDIOGRAM COMPLETE  Result Date: 06/28/2020    ECHOCARDIOGRAM REPORT   Patient Name:   ARJUNA DOEDEN Date of Exam: 06/28/2020 Medical Rec #:  [id number]       Height:       68.0 in Accession #:    [id number]      Weight:       175.0 lb Date of Birth:  Jan 20, 1929       BSA:          1.931  m Patient Age:    11 years        BP:           143/70 mmHg Patient Gender: M               HR:           64 bpm. Exam Location:  Inpatient Procedure: 2D Echo, Cardiac Doppler, Color Doppler and Intracardiac            Opacification Agent Indications:     Elevated Troponin  History:         Patient has prior history of Echocardiogram examinations, most                  recent 03/10/2019. Signs/Symptoms:Shortness of Breath; Risk                  Factors:Hypertension, Diabetes, Dyslipidemia and Former Smoker.  Sonographer:     Renella Cunas RDCS Referring Phys:  [id number] Teddy Spike Diagnosing Phys: Kristeen Miss MD IMPRESSIONS  1. Left ventricular ejection fraction, by estimation, is 60 to 65%. The left ventricle has normal function. The left ventricle has no regional wall motion abnormalities. Left ventricular diastolic parameters are indeterminate.  2. Right ventricular systolic function is moderately reduced. The right ventricular size is mildly enlarged.  3. The mitral valve is grossly normal. No evidence of mitral valve regurgitation. No evidence of mitral stenosis.   4. The aortic valve is calcified. Aortic valve regurgitation is not visualized. No aortic stenosis is present. FINDINGS  Left Ventricle: Left ventricular ejection fraction, by estimation, is 60 to 65%. The left ventricle has normal function. The left ventricle has no regional wall motion abnormalities. Definity contrast agent was given IV to delineate the left ventricular  endocardial borders. The left ventricular internal cavity size was normal in size. There is no left ventricular hypertrophy. Left ventricular diastolic parameters are indeterminate. Right Ventricle: The right ventricular size is mildly enlarged. Right vetricular wall thickness was not well visualized. Right ventricular systolic function is moderately reduced. Left Atrium: Left atrial size was normal in size. Right Atrium: Right atrial size was normal in size. Pericardium: There is no evidence of pericardial effusion. Mitral Valve: The mitral valve is grossly normal. Mild mitral annular calcification. No evidence of mitral valve regurgitation. No evidence of mitral valve stenosis. Tricuspid Valve: The tricuspid valve is normal in structure. Tricuspid valve regurgitation is trivial. Aortic Valve: The aortic valve is calcified. Aortic valve regurgitation is not visualized. No aortic stenosis is present. Pulmonic Valve: The pulmonic valve was normal in structure. Pulmonic valve regurgitation is trivial. Aorta: The aortic root and ascending aorta are structurally normal, with no evidence of dilitation. IAS/Shunts: The atrial septum is grossly normal.  LEFT VENTRICLE PLAX 2D LVIDd:         4.76 cm LVIDs:         3.16 cm LV PW:         1.03 cm LV IVS:        1.06 cm LVOT diam:     2.10 cm LV SV:         79 LV SV Index:   41 LVOT Area:     3.46 cm  LV Volumes (MOD) LV vol d, MOD A2C: 94.6 ml LV vol d, MOD A4C: 85.5 ml LV vol s, MOD A2C: 38.2 ml LV vol s, MOD A4C: 21.9 ml LV SV MOD A2C:     56.4 ml LV SV MOD A4C:  85.5 ml LV SV MOD BP:      60.2 ml  RIGHT VENTRICLE TAPSE (M-mode): 1.2 cm LEFT ATRIUM             Index       RIGHT ATRIUM           Index LA diam:        4.60 cm 2.38 cm/m  RA Area:     17.50 cm LA Vol (A2C):   47.1 ml 24.39 ml/m RA Volume:   44.10 ml  22.84 ml/m LA Vol (A4C):   48.1 ml 24.91 ml/m LA Biplane Vol: 50.0 ml 25.89 ml/m  AORTIC VALVE LVOT Vmax:   103.00 cm/s LVOT Vmean:  73.100 cm/s LVOT VTI:    0.228 m  AORTA Ao Root diam: 3.60 cm  SHUNTS Systemic VTI:  0.23 m Systemic Diam: 2.10 cm Kristeen Miss MD Electronically signed by Kristeen Miss MD Signature Date/Time: 06/28/2020/11:47:09 AM    Final (Updated)     Cardiac Studies   Echo 06/28/20:  IMPRESSIONS    1. Left ventricular ejection fraction, by estimation, is 60 to 65%. The  left ventricle has normal function. The left ventricle has no regional  wall motion abnormalities. Left ventricular diastolic parameters are  indeterminate.  2. Right ventricular systolic function is moderately reduced. The right  ventricular size is mildly enlarged.  3. The mitral valve is grossly normal. No evidence of mitral valve  regurgitation. No evidence of mitral stenosis.  4. The aortic valve is calcified. Aortic valve regurgitation is not  visualized. No aortic stenosis is present.   Patient Profile     86 y.o. male with secondary Mobitz type I heart block, complete heart block, first-degree heart block, aortic stenosis, diabetes, hypertension, prior DVT, and recurrent pneumonia admitted with pneumonia and hypoxia.  Cardiology was consulted for elevated troponin.  Assessment & Plan    # Demand ischemia: High-sensitivity troponin was elevated to 657.  EKG was without acute ischemic changes.  Given his lack of symptoms and normal systolic function on echo, no plans for inpatient ischemia evaluation.  Continue to hold beta-blockers in the setting of his heart block.  Continue aspirin.  Minimal coronary calcification noted on CT.  # Hypertension:  Continue home  lisinopril.  # Hyperlipidemia:  Continue rosuvastatin.  # Heart block: He has experience first, second, and third degree heart block.  Holding nodal agents as above.  [facility] will sign off.   Medication Recommendations:  No nodal agents  Other recommendations (labs, testing, etc):  none Follow up as an outpatient: Will schedule follow up after discharge  For questions or updates, please contact [facility] Please consult [email or web address] for contact info under        Signed, Chilton Si, MD  06/29/2020, 9:10 AM

## 2020-06-29 NOTE — Progress Notes (Addendum)
PROGRESS NOTE  KVION SHAPLEY DPO:[id number] DOB: [birth year] DOA: 06/27/2020 PCP: Marden Noble, MD   LOS: 2 days   Brief narrative: As per HPI,  [name] is a 86 y.o. male with medical history significant of DM, DM neuropathy, HLD, aortic stenosis, aspiration pneumonia presented to hospital with complaints of respiratory distress, shortness of breath and chills.  Patient also had episodes of vomiting and choking followed by trouble breathing.  EMS was called in and patient was noted to be hypoxic.  Patient does have history of GERD and takes proton pump inhibitors.  ED Course: CXR showed RML opacity concerning for infection. He was initially on NRB at presentation but weaned to 2L [place]. He was started on sepsis protocol in the ED. Patient was subsequently admitted to hospital for further treatment and evaluation.  Assessment/Plan:  Principal Problem:   Sepsis (HCC) Active Problems:   Elevated troponin  Sepsis likely secondary to aspiration pneumonia. Patient with sepsis criteria on presentation with temperature of 104.57F, respiratory rate of 24/min, lactate of 2.1 and source being pneumonia.  Currently on Unasyn.  CT scan showed multifocal centrally lobular groundglass nodularity and consolidation of the right lung.  Patient does have risk of aspiration.  Speech and swallow evaluation was performed.  CT angiogram of the chest was negative for PE.  Blood cultures negative in 2 days.  WBC mildly elevated will trend.  Procalcitonin was 18 on presentation. Check procalcitonin in am.      Elevated troponin, Hx of bradycardia Likely demand ischemia.  No chest pain.  Cardiology saw the patient for elevated troponin.  EKG was unremarkable.  2D echocardiogram on 06/28/2020 with normal wall motion and preserved LV function with left ventricular ejection fraction of 60 to 65%.  No further work-up recommended by cardiology.  Continue low-dose aspirin.     Hemoptysis No evidence of pulmonary  embolism as per CT scan.  Could be secondary to pneumonia.  We will continue to monitor.  No active hemoptysis at this time.  Diabetes mellitus type 2, DM neuropathy Continue sliding scale insulin Accu-Cheks.  continue home neurontin  GERD Resume PPI  Essential HTN Lisinopril on hold.  Blood pressure trending up.  Will resume lisinopril.  Hyperlipidemia Continue Crestor  DVT prophylaxis: SCDs Start: 06/27/20 1607   Code Status: DNR  Family Communication: I tried to call the patient's wife on the phone but was unable to reach her today.  Status is: Inpatient  Remains inpatient appropriate because:Unsafe d/c plan, IV treatments appropriate due to intensity of illness or inability to take PO and Inpatient level of care appropriate due to severity of illness   Dispo: The patient is from: Home              Anticipated d/c is to: Home  With home health.              Anticipated d/c date is: 2 days              Patient currently is not medically stable to d/c.   Consultants:  Cardiology   Palliative care  Procedures:  None  Antibiotics:  . Unasyn >06/28/2020  Anti-infectives (From admission, onward)   Start     Dose/Rate Route Frequency Ordered Stop   06/28/20 1200  Ampicillin-Sulbactam (UNASYN) 3 g in sodium chloride 0.9 % 100 mL IVPB        3 g 200 mL/hr over 30 Minutes Intravenous Every 6 hours 06/28/20 1104     06/27/20  0915  cefTRIAXone (ROCEPHIN) 2 g in sodium chloride 0.9 % 100 mL IVPB  Status:  Discontinued        2 g 200 mL/hr over 30 Minutes Intravenous Every 24 hours 06/27/20 0900 06/28/20 1104   06/27/20 0915  azithromycin (ZITHROMAX) 500 mg in sodium chloride 0.9 % 250 mL IVPB  Status:  Discontinued        500 mg 250 mL/hr over 60 Minutes Intravenous Every 24 hours 06/27/20 0900 06/28/20 1104     Subjective:  Today, patient was seen and examined at bedside. Has mild cough without sputum production, shortness of breath mild no fever chills, nausea  or vomiting.   Objective: Vitals:   06/29/20 0140 06/29/20 0459  BP: 135/84 (!) 157/58  Pulse: 81 78  Resp: 18 15  Temp: 98.7 F (37.1 C) 98 F (36.7 C)  SpO2: (!) 87% 91%    Intake/Output Summary (Last 24 hours) at 06/29/2020 0809 Last data filed at 06/29/2020 0635 Gross per 24 hour  Intake 1320 ml  Output 1700 ml  Net -380 ml   Filed Weights   06/27/20 0935  Weight: 79.4 kg   Body mass index is 26.61 kg/m.   Physical Exam:  GENERAL: Patient is alert awake and communicative, hard of hearing.  Not in obvious distress.  On nasal cannula oxygen. HENT: No scleral pallor or icterus. Pupils equally reactive to light. Oral mucosa is moist NECK: is supple, no gross swelling noted. CHEST: Diminished breath sounds bilaterally. Coarse breath sounds noted.  CVS: S1 and S2 heard, no murmur. Regular rate and rhythm.  ABDOMEN: Soft, non-tender, bowel sounds are present. EXTREMITIES: No edema. CNS: Cranial nerves are intact. No focal motor deficits. SKIN: warm and dry without rashes.  Data Review: I have personally reviewed the following laboratory data and studies,  CBC: Recent Labs  Lab 06/27/20 0900 06/28/20 0457  WBC 9.4 11.9*  NEUTROABS 8.0*  --   HGB 14.4 12.8*  HCT 43.1 39.2  MCV 93.3 97.0  PLT 214 181   Basic Metabolic Panel: Recent Labs  Lab 06/27/20 0900 06/28/20 0457  NA 141 139  K 3.8 3.6  CL 101 100  CO2 27 28  GLUCOSE 219* 138*  BUN 14 14  CREATININE 1.02 0.96  CALCIUM 9.5 9.2   Liver Function Tests: Recent Labs  Lab 06/27/20 0900 06/28/20 0457  AST 20 15  ALT 18 14  ALKPHOS 69 48  BILITOT 0.9 0.8  PROT 7.2 6.0*  ALBUMIN 3.9 3.2*   No results for input(s): LIPASE, AMYLASE in the last 168 hours. No results for input(s): AMMONIA in the last 168 hours. Cardiac Enzymes: No results for input(s): CKTOTAL, CKMB, CKMBINDEX, TROPONINI in the last 168 hours. BNP (last 3 results) No results for input(s): BNP in the last 8760 hours.  ProBNP  (last 3 results) No results for input(s): PROBNP in the last 8760 hours.  CBG: Recent Labs  Lab 06/28/20 0731 06/28/20 1210 06/28/20 1619 06/28/20 2054 06/29/20 0747  GLUCAP 125* 168* 254* 294* 148*   Recent Results (from the past 240 hour(s))  Blood Culture (routine x 2)     Status: None (Preliminary result)   Collection Time: 06/27/20  9:00 AM   Specimen: BLOOD  Result Value Ref Range Status   Specimen Description   Final    BLOOD LEFT WRIST Performed at [facility], [address], [place], [place] [address]    Special Requests   Final    BOTTLES  DRAWN AEROBIC AND ANAEROBIC Blood Culture results may not be optimal due to an excessive volume of blood received in culture bottles Performed at [facility], [address] [place], [place] [address]    Culture   Final    NO GROWTH 2 DAYS Performed at [facility], [address] [place], [place] [address]    Report Status PENDING  Incomplete  Urine culture     Status: None   Collection Time: 06/27/20  9:00 AM   Specimen: In/Out Cath Urine  Result Value Ref Range Status   Specimen Description   Final    IN/OUT CATH URINE Performed at [facility], [address], [place], [place] [address]    Special Requests   Final    NONE Performed at [facility], [address], [place], [place] [address]    Culture   Final    NO GROWTH Performed at [facility], [address] [place], [place] [address]    Report Status 06/28/2020 FINAL  Final  Blood Culture (routine x 2)     Status: None (Preliminary result)   Collection Time: 06/27/20  9:04 AM   Specimen: BLOOD RIGHT HAND  Result Value Ref Range Status   Specimen Description   Final    BLOOD RIGHT HAND Performed at [facility], [address] [place], [place] [address]    Special Requests   Final    BOTTLES DRAWN AEROBIC AND ANAEROBIC Blood Culture results may  not be optimal due to an excessive volume of blood received in culture bottles Performed at [facility], [address] [place], [place] [address]    Culture   Final    NO GROWTH 2 DAYS Performed at [facility], [address] [place], [place] [address]    Report Status PENDING  Incomplete  Respiratory Panel by RT PCR (Flu A&B, Covid) - Nasopharyngeal Swab     Status: None   Collection Time: 06/27/20  9:31 AM   Specimen: Nasopharyngeal Swab  Result Value Ref Range Status   SARS Coronavirus 2 by RT PCR NEGATIVE NEGATIVE Final    Comment: (NOTE) SARS-CoV-2 target nucleic acids are NOT DETECTED.  The SARS-CoV-2 RNA is generally detectable in upper respiratoy specimens during the acute phase of infection. The lowest concentration of SARS-CoV-2 viral copies this assay can detect is 131 copies/mL. A negative result does not preclude SARS-Cov-2 infection and should not be used as the sole basis for treatment or other patient management decisions. A negative result may occur with  improper specimen collection/handling, submission of specimen other than nasopharyngeal swab, presence of viral mutation(s) within the areas targeted by this assay, and inadequate number of viral copies (<131 copies/mL). A negative result must be combined with clinical observations, patient history, and epidemiological information. The expected result is Negative.  Fact Sheet for Patients:  https://www.moore.com/  Fact Sheet for Healthcare Providers:  https://www.young.biz/  This test is no t yet approved or cleared by the [place] FDA and  has been authorized for detection and/or diagnosis of SARS-CoV-2 by FDA under an Emergency Use Authorization (EUA). This EUA will remain  in effect (meaning this test can be used) for the duration of the COVID-19 declaration under Section 564(b)(1) of the Act, 21 U.S.C. section 360bbb-3(b)(1), unless  the authorization is terminated or revoked sooner.     Influenza A by PCR NEGATIVE NEGATIVE Final   Influenza B  by PCR NEGATIVE NEGATIVE Final    Comment: (NOTE) The Xpert Xpress SARS-CoV-2/FLU/RSV assay is intended as an aid in  the diagnosis of influenza from Nasopharyngeal swab specimens and  should not be used as a sole basis for treatment. Nasal washings and  aspirates are unacceptable for Xpert Xpress SARS-CoV-2/FLU/RSV  testing.  Fact Sheet for Patients: https://www.moore.com/  Fact Sheet for Healthcare Providers: https://www.young.biz/  This test is not yet approved or cleared by the [place] FDA and  has been authorized for detection and/or diagnosis of SARS-CoV-2 by  FDA under an Emergency Use Authorization (EUA). This EUA will remain  in effect (meaning this test can be used) for the duration of the  Covid-19 declaration under Section 564(b)(1) of the Act, 21  U.S.C. section 360bbb-3(b)(1), unless the authorization is  terminated or revoked. Performed at [facility], [address] [place], [place] [address]   Expectorated sputum assessment w rflx to resp cult     Status: None   Collection Time: 06/27/20 10:10 AM   Specimen: Expectorated Sputum  Result Value Ref Range Status   Specimen Description EXPECTORATED SPUTUM  Final   Special Requests NONE  Final   Sputum evaluation   Final    THIS SPECIMEN IS ACCEPTABLE FOR SPUTUM CULTURE Performed at [facility], [address], [place], [place] [address]    Report Status 06/27/2020 FINAL  Final  Culture, respiratory     Status: None (Preliminary result)   Collection Time: 06/27/20 10:10 AM  Result Value Ref Range Status   Specimen Description   Final    EXPECTORATED SPUTUM Performed at [facility], [address], [place], [place] [address]    Special Requests   Final    NONE Reflexed from [phone] Performed at  [facility], [address] [place], [place] [address]    Gram Stain   Final    MODERATE WBC PRESENT,BOTH PMN AND MONONUCLEAR ABUNDANT GRAM POSITIVE COCCI FEW GRAM NEGATIVE RODS FEW GRAM VARIABLE ROD    Culture   Final    CULTURE REINCUBATED FOR BETTER GROWTH Performed at [facility], [address], [place], [place] [address]    Report Status PENDING  Incomplete     Studies: CT ANGIO CHEST PE W OR WO CONTRAST  Result Date: 06/27/2020 CLINICAL DATA:  Several short of breath.  History of aspiration EXAM: CT ANGIOGRAPHY CHEST WITH CONTRAST TECHNIQUE: Multidetector CT imaging of the chest was performed using the standard protocol during bolus administration of intravenous contrast. Multiplanar CT image reconstructions and MIPs were obtained to evaluate the vascular anatomy. CONTRAST:  OMNIPAQUE IOHEXOL 350 MG/ML SOLN COMPARISON:  March 14, 2020 FINDINGS: Cardiovascular: Evaluation of the subsegmental pulmonary arteries is limited secondary to respiratory motion. No pulmonary embolism through the segmental pulmonary arteries. Heart is normal in size. Predominately LEFT-sided atherosclerotic calcifications of the coronary arteries. Moderate atherosclerotic calcifications of the aorta. No pericardial effusion. Calcifications of the aortic valve leaflets. Mediastinum/Nodes: Moderate to large hiatal hernia, similar in comparison to prior. No suspicious axillary adenopathy. Similar appearance of mildly prominent mediastinal lymph nodes. Subcentimeter LEFT thyroid nodule. Lungs/Pleura: No pleural effusion or pneumothorax. There is centrilobular ground-glass nodularity and consolidative opacity throughout the RIGHT upper lobe, RIGHT middle lobe and RIGHT lower lobe in a perihilar predominant distribution. Resolution of previously seen contralateral centrilobular nodularity. Scattered pleural plaques. Scattered LEFT basilar atelectasis. Evaluation for fine parenchymal detail is  limited secondary to extensive respiratory motion.  Upper Abdomen: No acute abnormality. Musculoskeletal: Gynecomastia. Degenerative changes of the thoracic spine. Review of the MIP images confirms the above findings. IMPRESSION: 1. No evidence of pulmonary embolism through the segmental pulmonary arteries. 2. Multifocal centrilobular ground-glass nodularity and consolidative opacity throughout the RIGHT lung, most consistent with pneumonia. Recommend radiographic follow-up to resolution. 3. Moderate to large hiatal hernia. Aortic Atherosclerosis (ICD10-I70.0). Electronically Signed   By: Meda Klinefelter MD   On: 06/27/2020 13:23   DG Chest Port 1 View  Result Date: 06/27/2020 CLINICAL DATA:  Questionable sepsis, evaluate for abnormality EXAM: PORTABLE CHEST 1 VIEW COMPARISON:  03/14/2020 FINDINGS: Mild cardiomegaly. There is dense, heterogeneous airspace opacity of the right midlung, new compared to prior examination. The visualized skeletal structures are unremarkable. IMPRESSION: 1. There is dense, heterogeneous airspace opacity of the right midlung, new compared to prior examination, concerning for infection. Recommend follow-up radiographs in 6-8 weeks to ensure complete radiographic resolution. 2. Mild cardiomegaly. Electronically Signed   By: Lauralyn Primes M.D.   On: 06/27/2020 09:27   ECHOCARDIOGRAM COMPLETE  Result Date: 06/28/2020    ECHOCARDIOGRAM REPORT   Patient Name:   AYODEJI THI Date of Exam: 06/28/2020 Medical Rec #:  [id number]       Height:       68.0 in Accession #:    [id number]      Weight:       175.0 lb Date of Birth:  Mar 19, 1929       BSA:          1.931 m Patient Age:    89 years        BP:           143/70 mmHg Patient Gender: M               HR:           64 bpm. Exam Location:  Inpatient Procedure: 2D Echo, Cardiac Doppler, Color Doppler and Intracardiac            Opacification Agent Indications:     Elevated Troponin  History:         Patient has prior history of  Echocardiogram examinations, most                  recent 03/10/2019. Signs/Symptoms:Shortness of Breath; Risk                  Factors:Hypertension, Diabetes, Dyslipidemia and Former Smoker.  Sonographer:     Renella Cunas RDCS Referring Phys:  [id number] Teddy Spike Diagnosing Phys: Kristeen Miss MD IMPRESSIONS  1. Left ventricular ejection fraction, by estimation, is 60 to 65%. The left ventricle has normal function. The left ventricle has no regional wall motion abnormalities. Left ventricular diastolic parameters are indeterminate.  2. Right ventricular systolic function is moderately reduced. The right ventricular size is mildly enlarged.  3. The mitral valve is grossly normal. No evidence of mitral valve regurgitation. No evidence of mitral stenosis.  4. The aortic valve is calcified. Aortic valve regurgitation is not visualized. No aortic stenosis is present. FINDINGS  Left Ventricle: Left ventricular ejection fraction, by estimation, is 60 to 65%. The left ventricle has normal function. The left ventricle has no regional wall motion abnormalities. Definity contrast agent was given IV to delineate the left ventricular  endocardial borders. The left ventricular internal cavity size was normal in size. There is no left ventricular hypertrophy. Left ventricular diastolic parameters are indeterminate. Right Ventricle: The right ventricular size  is mildly enlarged. Right vetricular wall thickness was not well visualized. Right ventricular systolic function is moderately reduced. Left Atrium: Left atrial size was normal in size. Right Atrium: Right atrial size was normal in size. Pericardium: There is no evidence of pericardial effusion. Mitral Valve: The mitral valve is grossly normal. Mild mitral annular calcification. No evidence of mitral valve regurgitation. No evidence of mitral valve stenosis. Tricuspid Valve: The tricuspid valve is normal in structure. Tricuspid valve regurgitation is trivial. Aortic Valve:  The aortic valve is calcified. Aortic valve regurgitation is not visualized. No aortic stenosis is present. Pulmonic Valve: The pulmonic valve was normal in structure. Pulmonic valve regurgitation is trivial. Aorta: The aortic root and ascending aorta are structurally normal, with no evidence of dilitation. IAS/Shunts: The atrial septum is grossly normal.  LEFT VENTRICLE PLAX 2D LVIDd:         4.76 cm LVIDs:         3.16 cm LV PW:         1.03 cm LV IVS:        1.06 cm LVOT diam:     2.10 cm LV SV:         79 LV SV Index:   41 LVOT Area:     3.46 cm  LV Volumes (MOD) LV vol d, MOD A2C: 94.6 ml LV vol d, MOD A4C: 85.5 ml LV vol s, MOD A2C: 38.2 ml LV vol s, MOD A4C: 21.9 ml LV SV MOD A2C:     56.4 ml LV SV MOD A4C:     85.5 ml LV SV MOD BP:      60.2 ml RIGHT VENTRICLE TAPSE (M-mode): 1.2 cm LEFT ATRIUM             Index       RIGHT ATRIUM           Index LA diam:        4.60 cm 2.38 cm/m  RA Area:     17.50 cm LA Vol (A2C):   47.1 ml 24.39 ml/m RA Volume:   44.10 ml  22.84 ml/m LA Vol (A4C):   48.1 ml 24.91 ml/m LA Biplane Vol: 50.0 ml 25.89 ml/m  AORTIC VALVE LVOT Vmax:   103.00 cm/s LVOT Vmean:  73.100 cm/s LVOT VTI:    0.228 m  AORTA Ao Root diam: 3.60 cm  SHUNTS Systemic VTI:  0.23 m Systemic Diam: 2.10 cm Kristeen Miss MD Electronically signed by Kristeen Miss MD Signature Date/Time: 06/28/2020/11:47:09 AM    Final (Updated)       Joycelyn Das, MD  [facility] 06/29/2020

## 2020-06-30 DIAGNOSIS — I5032 Chronic diastolic (congestive) heart failure: Secondary | ICD-10-CM

## 2020-06-30 DIAGNOSIS — Z794 Long term (current) use of insulin: Secondary | ICD-10-CM

## 2020-06-30 DIAGNOSIS — I10 Essential (primary) hypertension: Secondary | ICD-10-CM

## 2020-06-30 DIAGNOSIS — E1165 Type 2 diabetes mellitus with hyperglycemia: Secondary | ICD-10-CM

## 2020-06-30 DIAGNOSIS — J189 Pneumonia, unspecified organism: Secondary | ICD-10-CM

## 2020-06-30 LAB — GLUCOSE, CAPILLARY
Glucose-Capillary: 147 mg/dL — ABNORMAL HIGH (ref 70–99)
Glucose-Capillary: 225 mg/dL — ABNORMAL HIGH (ref 70–99)
Glucose-Capillary: 281 mg/dL — ABNORMAL HIGH (ref 70–99)
Glucose-Capillary: 196 mg/dL — ABNORMAL HIGH (ref 70–99)

## 2020-06-30 LAB — PROCALCITONIN: Procalcitonin: 5.42 ng/mL

## 2020-06-30 NOTE — Progress Notes (Addendum)
Patient oxygen saturation 80% at rest on room air  patient asymptomatic denies shortness of breath. Ambulated patient in hallway oxygen decreased to 78% on room air oxygen applied at 2 liters oxygen increased to 84% readjusted oxygen to 4 liters and  oxygen saturation increased to 92%. Patient denies any  discomfort. Assisted patient back to chair. Patient placed on 3 liters oxygen via nasal cannula tolerated well.

## 2020-06-30 NOTE — Progress Notes (Addendum)
PROGRESS NOTE  Jackson [name] ULA:[id number] DOB: [birth year] DOA: 06/27/2020 PCP: Josetta Huddle, MD   LOS: 3 days   Brief narrative: As per HPI,  Jackson [name] is a 86 y.o. male with medical history significant of DM, DM neuropathy, HLD, aortic stenosis, aspiration pneumonia presented to hospital with complaints of respiratory distress, shortness of breath and chills.  Patient also had episodes of vomiting and choking followed by trouble breathing.  EMS was called in and patient was noted to be hypoxic.  Patient does have history of GERD and takes proton pump inhibitors.  ED Course: CXR showed RML opacity concerning for infection. He was initially on NRB at presentation but weaned to 2L [employer]. He was started on sepsis protocol in the ED. Patient was subsequently admitted to hospital for further treatment and evaluation.  Assessment/Plan:  Principal Problem:   Sepsis ([place]) Active Problems:   Acute respiratory failure with hypoxia (HCC)   Multifocal pneumonia   Type 2 diabetes mellitus with hyperglycemia (HCC)   Essential hypertension   Chronic diastolic CHF (congestive heart failure) (HCC)   Elevated troponin   Demand ischemia (HCC)  Sepsis likely secondary to aspiration pneumonia.   Acute hypoxic respiratory failure secondary to aspiration pneumonia. Patient met sepsis criteria on presentation with temperature of 104.43F, respiratory rate of 24/min, lactate of 2.1 and source being pneumonia.  Currently on Unasyn.  CT scan showed multifocal centrally lobular groundglass nodularity and consolidation of the right lung.  Seen by speech therapy in patient does have risk of aspiration.  CT angiogram of the chest was negative for PE.  Blood cultures negative so far.   Procalcitonin was 18 on presentation.  Procalcitonin today at 5.0.  Check CBC in a.m. nursing staff reporting hypoxia in the lower 80s without oxygen but will continue to wean oxygen as able.      Elevated troponin, Hx of  bradycardia Likely demand ischemia.  Seen by cardiology.EKG was unremarkable.  2D echocardiogram on 06/28/2020 with normal wall motion and preserved LV function with left ventricular ejection fraction of 60 to 65%.  No further work-up recommended by cardiology.  Continue low-dose aspirin.      Hemoptysis Likely secondary to pneumonia.  No evidence of pulmonary embolism as per CT scan.  No further episodes  Diabetes mellitus type 2, DM neuropathy Continue sliding scale insulin, Accu-Cheks.  continue home neurontin  GERD Continue PPI  Essential HTN Resumed lisinopril.  Blood pressure marginally high will continue to monitor.  Hyperlipidemia Continue Crestor  DVT prophylaxis: SCDs Start: 06/27/20 1607   Code Status: DNR  Family Communication: I spoke with the patient's wife on the phone and updated her about the clinical condition of the patient.  Status is: Inpatient  Remains inpatient appropriate because:Unsafe d/c plan, IV treatments appropriate due to intensity of illness or inability to take PO and Inpatient level of care appropriate due to severity of illness, acute hypoxic respiratory failure secondary to aspiration pneumonia.   Dispo: The patient is from: Home              Anticipated d/c is to: Home  With home health.  Need to wean oxygen.              Anticipated d/c date is: 1-2 days              Patient currently is not medically stable to d/c.   Consultants:  Cardiology   Palliative care  Procedures:  None  Antibiotics:  . Unasyn >  06/28/2020  Anti-infectives (From admission, onward)   Start     Dose/Rate Route Frequency Ordered Stop   06/28/20 1200  Ampicillin-Sulbactam (UNASYN) 3 g in sodium chloride 0.9 % 100 mL IVPB        3 g 200 mL/hr over 30 Minutes Intravenous Every 6 hours 06/28/20 1104     06/27/20 0915  cefTRIAXone (ROCEPHIN) 2 g in sodium chloride 0.9 % 100 mL IVPB  Status:  Discontinued        2 g 200 mL/hr over 30 Minutes Intravenous  Every 24 hours 06/27/20 0900 06/28/20 1104   06/27/20 0915  azithromycin (ZITHROMAX) 500 mg in sodium chloride 0.9 % 250 mL IVPB  Status:  Discontinued        500 mg 250 mL/hr over 60 Minutes Intravenous Every 24 hours 06/27/20 0900 06/28/20 1104     Subjective:  Today, patient states that he does have cough without any productive sputum.  Has mild abdominal pain while coughing.  He denies any fever chills or rigor.  Has mild shortness of breath.  Patient staff reported that the patient becomes hypoxic on trying to wean the oxygen.  Objective: Vitals:   06/30/20 0500 06/30/20 0735  BP: (!) 152/61   Pulse: (!) 51   Resp: 19   Temp: 97.9 F (36.6 C)   SpO2: 97% 95%    Intake/Output Summary (Last 24 hours) at 06/30/2020 0830 Last data filed at 06/30/2020 0817 Gross per 24 hour  Intake 578.59 ml  Output 2075 ml  Net -1496.41 ml   Filed Weights   06/27/20 0935  Weight: 79.4 kg   Body mass index is 26.61 kg/m.   Physical Exam:  GENERAL: Patient is alert awake and communicative, hard of hearing.  Not in obvious distress.  On nasal cannula oxygen. HENT: No scleral pallor or icterus. Pupils equally reactive to light. Oral mucosa is moist NECK: is supple, no gross swelling noted. CHEST: Diminished breath sounds bilaterally. Coarse breath sounds noted.  CVS: S1 and S2 heard, no murmur. Regular rate and rhythm.  ABDOMEN: Soft, non-tender, bowel sounds are present. EXTREMITIES: No edema. CNS: Cranial nerves are intact. No focal motor deficits. SKIN: warm and dry without rashes.  Data Review: I have personally reviewed the following laboratory data and studies,  CBC: Recent Labs  Lab 06/27/20 0900 06/28/20 0457  WBC 9.4 11.9*  NEUTROABS 8.0*  --   HGB 14.4 12.8*  HCT 43.1 39.2  MCV 93.3 97.0  PLT 214 676   Basic Metabolic Panel: Recent Labs  Lab 06/27/20 0900 06/28/20 0457  NA 141 139  K 3.8 3.6  CL 101 100  CO2 27 28  GLUCOSE 219* 138*  BUN 14 14  CREATININE  1.02 0.96  CALCIUM 9.5 9.2   Liver Function Tests: Recent Labs  Lab 06/27/20 0900 06/28/20 0457  AST 20 15  ALT 18 14  ALKPHOS 69 48  BILITOT 0.9 0.8  PROT 7.2 6.0*  ALBUMIN 3.9 3.2*   No results for input(s): LIPASE, AMYLASE in the last 168 hours. No results for input(s): AMMONIA in the last 168 hours. Cardiac Enzymes: No results for input(s): CKTOTAL, CKMB, CKMBINDEX, TROPONINI in the last 168 hours. BNP (last 3 results) No results for input(s): BNP in the last 8760 hours.  ProBNP (last 3 results) No results for input(s): PROBNP in the last 8760 hours.  CBG: Recent Labs  Lab 06/29/20 1150 06/29/20 1610 06/29/20 1740 06/29/20 2206 06/30/20 0813  GLUCAP 275* 244* 279* 234*  147*   Recent Results (from the past 240 hour(s))  Blood Culture (routine x 2)     Status: None (Preliminary result)   Collection Time: 06/27/20  9:00 AM   Specimen: BLOOD  Result Value Ref Range Status   Specimen Description   Final    BLOOD LEFT WRIST Performed at [facility] [address], [place], [place] [address]    Special Requests   Final    BOTTLES DRAWN AEROBIC AND ANAEROBIC Blood Culture results may not be optimal due to an excessive volume of blood received in culture bottles Performed at [facility] [address] [place], [place] [address]    Culture   Final    NO GROWTH 2 DAYS Performed at [facility] [address] [place], [place] [address]    Report Status PENDING  Incomplete  Urine culture     Status: None   Collection Time: 06/27/20  9:00 AM   Specimen: In/Out Cath Urine  Result Value Ref Range Status   Specimen Description   Final    IN/OUT CATH URINE Performed at [facility] [address] [place], [place] [address]    Special Requests   Final    NONE Performed at [facility], [place] [address], [place], [place] [address]    Culture   Final    NO GROWTH Performed at  [facility], [place] [address] [place], [place] [address]    Report Status 06/28/2020 FINAL  Final  Blood Culture (routine x 2)     Status: None (Preliminary result)   Collection Time: 06/27/20  9:04 AM   Specimen: BLOOD RIGHT HAND  Result Value Ref Range Status   Specimen Description   Final    BLOOD RIGHT HAND Performed at [facility] [address] [place], [place] [address]    Special Requests   Final    BOTTLES DRAWN AEROBIC AND ANAEROBIC Blood Culture results may not be optimal due to an excessive volume of blood received in culture bottles Performed at [facility] [address], [place],  [address]    Culture   Final    NO GROWTH 2 DAYS Performed at [facility] [address] [place],  [address]    Report Status PENDING  Incomplete  Respiratory Panel by RT PCR (Flu A&B, Covid) - Nasopharyngeal Swab     Status: None   Collection Time: 06/27/20  9:31 AM   Specimen: Nasopharyngeal Swab  Result Value Ref Range Status   SARS Coronavirus 2 by RT PCR NEGATIVE NEGATIVE Final    Comment: (NOTE) SARS-CoV-2 target nucleic acids are NOT DETECTED.  The SARS-CoV-2 RNA is generally detectable in upper respiratoy specimens during the acute phase of infection. The lowest concentration of SARS-CoV-2 viral copies this assay can detect is 131 copies/mL. A negative result does not preclude SARS-Cov-2 infection and should not be used as the sole basis for treatment or other patient management decisions. A negative result may occur with  improper specimen collection/handling, submission of specimen other than nasopharyngeal swab, presence of viral mutation(s) within the areas targeted by this assay, and inadequate number of viral copies (<131 copies/mL). A negative result must be combined with clinical observations, patient history, and epidemiological information. The expected result is Negative.  Fact Sheet for  Patients:  PinkCheek.be  Fact Sheet for Healthcare Providers:  GravelBags.it  This test is no t yet  approved or cleared by the [place] and  has been authorized for detection and/or diagnosis of SARS-CoV-2 by FDA under an Emergency Use Authorization (EUA). This EUA will remain  in effect (meaning this test can be used) for the duration of the COVID-19 declaration under Section 564(b)(1) of the Act, 21 U.S.C. section 360bbb-3(b)(1), unless the authorization is terminated or revoked sooner.     Influenza A by PCR NEGATIVE NEGATIVE Final   Influenza B by PCR NEGATIVE NEGATIVE Final    Comment: (NOTE) The Xpert Xpress SARS-CoV-2/FLU/RSV assay is intended as an aid in  the diagnosis of influenza from Nasopharyngeal swab specimens and  should not be used as a sole basis for treatment. Nasal washings and  aspirates are unacceptable for Xpert Xpress SARS-CoV-2/FLU/RSV  testing.  Fact Sheet for Patients: PinkCheek.be  Fact Sheet for Healthcare Providers: GravelBags.it  This test is not yet approved or cleared by the [place] FDA and  has been authorized for detection and/or diagnosis of SARS-CoV-2 by  FDA under an Emergency Use Authorization (EUA). This EUA will remain  in effect (meaning this test can be used) for the duration of the  Covid-19 declaration under Section 564(b)(1) of the Act, 21  U.S.C. section 360bbb-3(b)(1), unless the authorization is  terminated or revoked. Performed at [facility], [place] [address], [place], [place] [address]   Expectorated sputum assessment w rflx to resp cult     Status: None   Collection Time: 06/27/20 10:10 AM   Specimen: Expectorated Sputum  Result Value Ref Range Status   Specimen Description EXPECTORATED SPUTUM  Final   Special Requests NONE  Final   Sputum evaluation   Final    THIS  SPECIMEN IS ACCEPTABLE FOR SPUTUM CULTURE Performed at [facility], [place] [address] [place], [place] [address]    Report Status 06/27/2020 FINAL  Final  Culture, respiratory     Status: None   Collection Time: 06/27/20 10:10 AM  Result Value Ref Range Status   Specimen Description   Final    EXPECTORATED SPUTUM Performed at [facility] [address] [place], [place] [address]    Special Requests   Final    NONE Reflexed from [id number] Performed at [facility], [place] [address] [place], [place] [address]    Gram Stain   Final    MODERATE WBC PRESENT,BOTH PMN AND MONONUCLEAR ABUNDANT GRAM POSITIVE COCCI FEW GRAM NEGATIVE RODS FEW GRAM VARIABLE ROD    Culture   Final    Normal respiratory flora-no Staph aureus or Pseudomonas seen Performed at [facility], [address] [place], [place] [address]    Report Status 06/29/2020 FINAL  Final     Studies: ECHOCARDIOGRAM COMPLETE  Result Date: 06/28/2020    ECHOCARDIOGRAM REPORT   Patient Name:   Jackson [name] Date of Exam: 06/28/2020 Medical Rec #:  [id number]       Height:       68.0 in Accession #:    [id number]      Weight:       175.0 lb Date of Birth:  Mar 21, 1929       BSA:          1.931 m Patient Age:    10 years        BP:           143/70 mmHg Patient Gender: M  HR:           64 bpm. Exam Location:  Inpatient Procedure: 2D Echo, Cardiac Doppler, Color Doppler and Intracardiac            Opacification Agent Indications:     Elevated Troponin  History:         Patient has prior history of Echocardiogram examinations, most                  recent 03/10/2019. Signs/Symptoms:Shortness of Breath; Risk                  Factors:Hypertension, Diabetes, Dyslipidemia and Former Smoker.  Sonographer:     Vickie Epley RDCS Referring Phys:  [id number] Jonnie Finner Diagnosing Phys: Mertie Moores MD IMPRESSIONS  1. Left ventricular ejection fraction, by estimation, is 60 to  65%. The left ventricle has normal function. The left ventricle has no regional wall motion abnormalities. Left ventricular diastolic parameters are indeterminate.  2. Right ventricular systolic function is moderately reduced. The right ventricular size is mildly enlarged.  3. The mitral valve is grossly normal. No evidence of mitral valve regurgitation. No evidence of mitral stenosis.  4. The aortic valve is calcified. Aortic valve regurgitation is not visualized. No aortic stenosis is present. FINDINGS  Left Ventricle: Left ventricular ejection fraction, by estimation, is 60 to 65%. The left ventricle has normal function. The left ventricle has no regional wall motion abnormalities. Definity contrast agent was given IV to delineate the left ventricular  endocardial borders. The left ventricular internal cavity size was normal in size. There is no left ventricular hypertrophy. Left ventricular diastolic parameters are indeterminate. Right Ventricle: The right ventricular size is mildly enlarged. Right vetricular wall thickness was not well visualized. Right ventricular systolic function is moderately reduced. Left Atrium: Left atrial size was normal in size. Right Atrium: Right atrial size was normal in size. Pericardium: There is no evidence of pericardial effusion. Mitral Valve: The mitral valve is grossly normal. Mild mitral annular calcification. No evidence of mitral valve regurgitation. No evidence of mitral valve stenosis. Tricuspid Valve: The tricuspid valve is normal in structure. Tricuspid valve regurgitation is trivial. Aortic Valve: The aortic valve is calcified. Aortic valve regurgitation is not visualized. No aortic stenosis is present. Pulmonic Valve: The pulmonic valve was normal in structure. Pulmonic valve regurgitation is trivial. Aorta: The aortic root and ascending aorta are structurally normal, with no evidence of dilitation. IAS/Shunts: The atrial septum is grossly normal.  LEFT VENTRICLE  PLAX 2D LVIDd:         4.76 cm LVIDs:         3.16 cm LV PW:         1.03 cm LV IVS:        1.06 cm LVOT diam:     2.10 cm LV SV:         79 LV SV Index:   41 LVOT Area:     3.46 cm  LV Volumes (MOD) LV vol d, MOD A2C: 94.6 ml LV vol d, MOD A4C: 85.5 ml LV vol s, MOD A2C: 38.2 ml LV vol s, MOD A4C: 21.9 ml LV SV MOD A2C:     56.4 ml LV SV MOD A4C:     85.5 ml LV SV MOD BP:      60.2 ml RIGHT VENTRICLE TAPSE (M-mode): 1.2 cm LEFT ATRIUM             Index  RIGHT ATRIUM           Index LA diam:        4.60 cm 2.38 cm/m  RA Area:     17.50 cm LA Vol (A2C):   47.1 ml 24.39 ml/m RA Volume:   44.10 ml  22.84 ml/m LA Vol (A4C):   48.1 ml 24.91 ml/m LA Biplane Vol: 50.0 ml 25.89 ml/m  AORTIC VALVE LVOT Vmax:   103.00 cm/s LVOT Vmean:  73.100 cm/s LVOT VTI:    0.228 m  AORTA Ao Root diam: 3.60 cm  SHUNTS Systemic VTI:  0.23 m Systemic Diam: 2.10 cm Mertie Moores MD Electronically signed by Mertie Moores MD Signature Date/Time: 06/28/2020/11:47:09 AM    Final (Updated)       Flora Lipps, MD  [facility] 06/30/2020

## 2020-06-30 NOTE — Plan of Care (Signed)
°  Problem: Health Behavior/Discharge Planning: Goal: Ability to manage health-related needs will improve Outcome: Progressing   Problem: Clinical Measurements: Goal: Ability to maintain clinical measurements within normal limits will improve Outcome: Progressing Goal: Will remain free from infection Outcome: Progressing Goal: Diagnostic test results will improve Outcome: Progressing Goal: Respiratory complications will improve Outcome: Progressing Goal: Cardiovascular complication will be avoided Outcome: Progressing   Problem: Activity: Goal: Risk for activity intolerance will decrease Outcome: Progressing   Problem: Nutrition: Goal: Adequate nutrition will be maintained Outcome: Progressing   Problem: Coping: Goal: Level of anxiety will decrease Outcome: Progressing   Problem: Elimination: Goal: Will not experience complications related to bowel motility Outcome: Progressing Goal: Will not experience complications related to urinary retention Outcome: Progressing   Problem: Safety: Goal: Ability to remain free from injury will improve Outcome: Progressing   Problem: Activity: Goal: Ability to tolerate increased activity will improve Outcome: Progressing   Problem: Clinical Measurements: Goal: Ability to maintain a body temperature in the normal range will improve Outcome: Progressing   Problem: Respiratory: Goal: Ability to maintain adequate ventilation will improve Outcome: Progressing Goal: Ability to maintain a clear airway will improve Outcome: Progressing   

## 2020-06-30 NOTE — Progress Notes (Signed)
SATURATION QUALIFICATIONS: (This note is used to comply with regulatory documentation for home oxygen)  Patient Saturations on Room Air at Rest = 80%  Patient Saturations on Room Air while Ambulating =78%  Patient Saturations on 4 Liters of oxygen while Ambulating = 92 %  Please briefly explain why patient needs home oxygen: Patient unable to maintain adequate oxygen goal of 92% at rest and while ambulating.

## 2020-07-01 LAB — CBC
HCT: 34.1 % — ABNORMAL LOW (ref 39.0–52.0)
Hemoglobin: 11.3 g/dL — ABNORMAL LOW (ref 13.0–17.0)
MCH: 31.5 pg (ref 26.0–34.0)
MCHC: 33.1 g/dL (ref 30.0–36.0)
MCV: 95 fL (ref 80.0–100.0)
Platelets: 211 10*3/uL (ref 150–400)
RBC: 3.59 MIL/uL — ABNORMAL LOW (ref 4.22–5.81)
RDW: 12.5 % (ref 11.5–15.5)
WBC: 6.1 10*3/uL (ref 4.0–10.5)
nRBC: 0 % (ref 0.0–0.2)

## 2020-07-01 LAB — BASIC METABOLIC PANEL
Anion gap: 9 (ref 5–15)
BUN: 12 mg/dL (ref 8–23)
CO2: 29 mmol/L (ref 22–32)
Calcium: 9.2 mg/dL (ref 8.9–10.3)
Chloride: 101 mmol/L (ref 98–111)
Creatinine, Ser: 0.99 mg/dL (ref 0.61–1.24)
GFR, Estimated: >60 mL/min (ref >60)
Glucose, Bld: 126 mg/dL — ABNORMAL HIGH (ref 70–99)
Potassium: 3.6 mmol/L (ref 3.5–5.1)
Sodium: 139 mmol/L (ref 135–145)

## 2020-07-01 LAB — GLUCOSE, CAPILLARY
Glucose-Capillary: 116 mg/dL — ABNORMAL HIGH (ref 70–99)
Glucose-Capillary: 180 mg/dL — ABNORMAL HIGH (ref 70–99)

## 2020-07-01 MED ORDER — AMOXICILLIN-POT CLAVULANATE 875-125 MG PO TABS: 1.0000 | ORAL_TABLET | Freq: Two times a day (BID) | ORAL | 0 refills | Status: AC

## 2020-07-01 MED ORDER — ACETAMINOPHEN 325 MG PO TABS
650.0000 mg | ORAL_TABLET | Freq: Four times a day (QID) | ORAL | Status: DC | PRN
  Administered 2020-07-01: 650 mg via ORAL
  Filled 2020-07-01: qty 2

## 2020-07-01 MED ORDER — FUROSEMIDE 10 MG/ML IJ SOLN
40.0000 mg | Freq: Once | INTRAMUSCULAR | Status: AC
  Administered 2020-07-01: 40 mg via INTRAVENOUS
  Filled 2020-07-01: qty 4

## 2020-07-01 NOTE — Care Management Important Message (Signed)
Important Message  Patient Details IM Letter given to the Patient Name: Jackson [name] MRN: [id number] Date of Birth: 26-Jun-1929   Medicare Important Message Given:  Yes     Caren Macadam 07/01/2020, 11:34 AM

## 2020-07-01 NOTE — Progress Notes (Signed)
Per MD order, weaning patient off of oxygen. Patient 100% on 1 L. Progressed patient to Room air and patient has an O2 saturation of 98%.  Caprice Red, RN

## 2020-07-01 NOTE — Progress Notes (Signed)
SATURATION QUALIFICATIONS: (This note is used to comply with regulatory documentation for home oxygen)  Patient Saturations on Room Air at Rest = 100%  Patient Saturations on Room Air while Ambulating = 91-95%

## 2020-07-01 NOTE — Progress Notes (Signed)
Patient was provided his AVS document printout. Discharge teaching was provided. The patient was provided information about his new medication and information was provided regarding, when to call the MD. The patient left without any distress and said he had no further questions when asked.

## 2020-07-01 NOTE — Discharge Summary (Signed)
Physician Discharge Summary  [name] PFX:[id number] DOB: 03-12-1929 DOA: 06/27/2020  PCP: Josetta Huddle, MD  Admit date: 06/27/2020 Discharge date: 07/01/2020  Admitted From: Home  Discharge disposition: Home  Recommendations for Outpatient Follow-Up:   . Follow up with your primary care provider in one week.  . Check CBC, BMP, magnesium in the next visit . Take aspirations precautions.   . Patient will benefit from repeat chest x-ray in 3 to 4 weeks  Discharge Diagnosis:   Principal Problem:   Sepsis ([place]) Active Problems:   Acute respiratory failure with hypoxia (HCC)   Multifocal pneumonia   Type 2 diabetes mellitus with hyperglycemia (HCC)   Essential hypertension   Chronic diastolic CHF (congestive heart failure) (HCC)   Elevated troponin   Demand ischemia ([place])   Discharge Condition: Improved.  Diet recommendation: Low sodium, heart healthy.  Carbohydrate-modified.    Wound care: None.  Code status: DNR   History of Present Illness:   Jackson Klabunde [name] a 86 y.o.malewith medical history significant ofDM, DM neuropathy, HLD, aortic stenosis, aspiration pneumonia presented to hospital with complaints of respiratory distress, shortness of breath and chills.  Patient also had episodes of vomiting and choking followed by trouble breathing.  EMS was called in and patient was noted to be hypoxic.  Patient does have history of GERD and takes proton pump inhibitors.  ED Course:CXR showed RML opacity concerning for infection. He was initially on NRB at presentation but weaned to 2L . He was started on sepsis protocol in the ED. Patient was subsequently admitted to hospital for further treatment and evaluation.  Hospital Course:   Following conditions were addressed during hospitalization as listed below,  Sepsis likely secondary to aspiration pneumonia.   Acute hypoxic respiratory failure secondary to aspiration pneumonia. Patient met sepsis criteria on  presentation with temperature of 104.67F, respiratory rate of 24/min, lactate of 2.1 and source being pneumonia.  Received IV Unasyn during hospitalization with supplemental oxygen. CT scan showed multifocal centrally lobular groundglass nodularity and consolidation of the right lung without pulmonary embolism..  Seen by speech therapy and patient does have risk of aspiration. blood cultures negative so far.   Procalcitonin was 18 > 5.0.    WBC of 6.1 today.  Afebrile.  Patient has clinically improved.  Currently off oxygen and saturating well.  Patient states that he wants to go home.  Will discharge home on 3 more days of Augmentin on discharge.  Elevated troponin, Hx of bradycardia Likely demand ischemia.  Seen by cardiology.EKG was unremarkable.  2D echocardiogram on 06/28/2020 with normal wall motion and preserved LV function with left ventricular ejection fraction of 60 to 65%.  No further work-up recommended by cardiology.  Continue low-dose aspirin on discharge..     Hemoptysis Likely secondary to pneumonia.  No evidence of pulmonary embolism as per CT scan.  No further episodesafter hospitalization.  Diabetes mellitus type 2, DM neuropathy Patient will resume insulin regimen from home.  Continue Neurontin for neuropathy.  GERD Continue PPI on discharge.  Essential HTN Patient will resume lisinopril on discharge.  Hyperlipidemia Continue Crestor  Disposition.  At this time, patient is stable for disposition home.  He was advised to follow-up with his primary care physician in 1 week.  Spoke with the patient's spouse on the phone yesterday regarding disposition home today  Medical Consultants:    None.  Procedures:    None Subjective:   Today, patient was seen and examined at bedside.  Feels better  with breathing overall.  Saturating well without oxygen.  Having some cough without sputum production.  Denies dyspnea.  Discharge Exam:   Vitals:   07/01/20 0516  07/01/20 0942  BP: (!) 154/49 (!) 160/72  Pulse: (!) 43   Resp: 16   Temp: 98.5 F (36.9 C)   SpO2: 97%    Vitals:   06/30/20 1413 06/30/20 2136 07/01/20 0516 07/01/20 0942  BP: (!) 153/51 (!) 171/57 (!) 154/49 (!) 160/72  Pulse: (!) 46 (!) 46 (!) 43   Resp: 19 20 16    Temp: 98.1 F (36.7 C) 97.8 F (36.6 C) 98.5 F (36.9 C)   TempSrc: Oral Oral Oral   SpO2: 95% 91% 97%   Weight:      Height:        General: Alert awake, not in obvious distress HENT: pupils equally reacting to light,  No scleral pallor or icterus noted. Oral mucosa is moist.  Chest: Diminished breath sounds bilaterally.  Coarse breath sounds noted. CVS: S1 &S2 heard. No murmur.  Regular rate and rhythm. Abdomen: Soft, nontender, nondistended.  Bowel sounds are heard.   Extremities: No cyanosis, clubbing or edema.  Peripheral pulses are palpable. Psych: Alert, awake and oriented, normal mood CNS:  No cranial nerve deficits.  Power equal in all extremities.   Skin: Warm and dry.  No rashes noted.  The results of significant diagnostics from this hospitalization (including imaging, microbiology, ancillary and laboratory) are listed below for reference.     Diagnostic Studies:   CT ANGIO CHEST PE W OR WO CONTRAST  Result Date: 06/27/2020 CLINICAL DATA:  Several short of breath.  History of aspiration EXAM: CT ANGIOGRAPHY CHEST WITH CONTRAST TECHNIQUE: Multidetector CT imaging of the chest was performed using the standard protocol during bolus administration of intravenous contrast. Multiplanar CT image reconstructions and MIPs were obtained to evaluate the vascular anatomy. CONTRAST:  181m OMNIPAQUE IOHEXOL 350 MG/ML SOLN COMPARISON:  March 14, 2020 FINDINGS: Cardiovascular: Evaluation of the subsegmental pulmonary arteries is limited secondary to respiratory motion. No pulmonary embolism through the segmental pulmonary arteries. Heart is normal in size. Predominately LEFT-sided atherosclerotic calcifications of  the coronary arteries. Moderate atherosclerotic calcifications of the aorta. No pericardial effusion. Calcifications of the aortic valve leaflets. Mediastinum/Nodes: Moderate to large hiatal hernia, similar in comparison to prior. No suspicious axillary adenopathy. Similar appearance of mildly prominent mediastinal lymph nodes. Subcentimeter LEFT thyroid nodule. Lungs/Pleura: No pleural effusion or pneumothorax. There is centrilobular ground-glass nodularity and consolidative opacity throughout the RIGHT upper lobe, RIGHT middle lobe and RIGHT lower lobe in a perihilar predominant distribution. Resolution of previously seen contralateral centrilobular nodularity. Scattered pleural plaques. Scattered LEFT basilar atelectasis. Evaluation for fine parenchymal detail is limited secondary to extensive respiratory motion. Upper Abdomen: No acute abnormality. Musculoskeletal: Gynecomastia. Degenerative changes of the thoracic spine. Review of the MIP images confirms the above findings. IMPRESSION: 1. No evidence of pulmonary embolism through the segmental pulmonary arteries. 2. Multifocal centrilobular ground-glass nodularity and consolidative opacity throughout the RIGHT lung, most consistent with pneumonia. Recommend radiographic follow-up to resolution. 3. Moderate to large hiatal hernia. Aortic Atherosclerosis (ICD10-I70.0). Electronically Signed   By: SValentino SaxonMD   On: 06/27/2020 13:23   DG Chest Port 1 View  Result Date: 06/27/2020 CLINICAL DATA:  Questionable sepsis, evaluate for abnormality EXAM: PORTABLE CHEST 1 VIEW COMPARISON:  03/14/2020 FINDINGS: Mild cardiomegaly. There is dense, heterogeneous airspace opacity of the right midlung, new compared to prior examination. The visualized skeletal structures are unremarkable.  IMPRESSION: 1. There is dense, heterogeneous airspace opacity of the right midlung, new compared to prior examination, concerning for infection. Recommend follow-up radiographs in  6-8 weeks to ensure complete radiographic resolution. 2. Mild cardiomegaly. Electronically Signed   By: Eddie Candle M.D.   On: 06/27/2020 09:27   ECHOCARDIOGRAM COMPLETE  Result Date: 06/28/2020    ECHOCARDIOGRAM REPORT   Patient Name:   JAKAVION BILODEAU Date of Exam: 06/28/2020 Medical Rec #:  [id number]       Height:       68.0 in Accession #:    [id number]      Weight:       175.0 lb Date of Birth:  04/14/1929       BSA:          1.931 m Patient Age:    86 years        BP:           143/70 mmHg Patient Gender: M               HR:           64 bpm. Exam Location:  Inpatient Procedure: 2D Echo, Cardiac Doppler, Color Doppler and Intracardiac            Opacification Agent Indications:     Elevated Troponin  History:         Patient has prior history of Echocardiogram examinations, most                  recent 03/10/2019. Signs/Symptoms:Shortness of Breath; Risk                  Factors:Hypertension, Diabetes, Dyslipidemia and Former Smoker.  Sonographer:     Vickie Epley RDCS Referring Phys:  [id number] Jonnie Finner Diagnosing Phys: Mertie Moores MD IMPRESSIONS  1. Left ventricular ejection fraction, by estimation, is 60 to 65%. The left ventricle has normal function. The left ventricle has no regional wall motion abnormalities. Left ventricular diastolic parameters are indeterminate.  2. Right ventricular systolic function is moderately reduced. The right ventricular size is mildly enlarged.  3. The mitral valve is grossly normal. No evidence of mitral valve regurgitation. No evidence of mitral stenosis.  4. The aortic valve is calcified. Aortic valve regurgitation is not visualized. No aortic stenosis is present. FINDINGS  Left Ventricle: Left ventricular ejection fraction, by estimation, is 60 to 65%. The left ventricle has normal function. The left ventricle has no regional wall motion abnormalities. Definity contrast agent was given IV to delineate the left ventricular  endocardial borders. The left ventricular  internal cavity size was normal in size. There is no left ventricular hypertrophy. Left ventricular diastolic parameters are indeterminate. Right Ventricle: The right ventricular size is mildly enlarged. Right vetricular wall thickness was not well visualized. Right ventricular systolic function is moderately reduced. Left Atrium: Left atrial size was normal in size. Right Atrium: Right atrial size was normal in size. Pericardium: There is no evidence of pericardial effusion. Mitral Valve: The mitral valve is grossly normal. Mild mitral annular calcification. No evidence of mitral valve regurgitation. No evidence of mitral valve stenosis. Tricuspid Valve: The tricuspid valve is normal in structure. Tricuspid valve regurgitation is trivial. Aortic Valve: The aortic valve is calcified. Aortic valve regurgitation is not visualized. No aortic stenosis is present. Pulmonic Valve: The pulmonic valve was normal in structure. Pulmonic valve regurgitation is trivial. Aorta: The aortic root and ascending aorta are structurally normal, with no evidence of  dilitation. IAS/Shunts: The atrial septum is grossly normal.  LEFT VENTRICLE PLAX 2D LVIDd:         4.76 cm LVIDs:         3.16 cm LV PW:         1.03 cm LV IVS:        1.06 cm LVOT diam:     2.10 cm LV SV:         79 LV SV Index:   41 LVOT Area:     3.46 cm  LV Volumes (MOD) LV vol d, MOD A2C: 94.6 ml LV vol d, MOD A4C: 85.5 ml LV vol s, MOD A2C: 38.2 ml LV vol s, MOD A4C: 21.9 ml LV SV MOD A2C:     56.4 ml LV SV MOD A4C:     85.5 ml LV SV MOD BP:      60.2 ml RIGHT VENTRICLE TAPSE (M-mode): 1.2 cm LEFT ATRIUM             Index       RIGHT ATRIUM           Index LA diam:        4.60 cm 2.38 cm/m  RA Area:     17.50 cm LA Vol (A2C):   47.1 ml 24.39 ml/m RA Volume:   44.10 ml  22.84 ml/m LA Vol (A4C):   48.1 ml 24.91 ml/m LA Biplane Vol: 50.0 ml 25.89 ml/m  AORTIC VALVE LVOT Vmax:   103.00 cm/s LVOT Vmean:  73.100 cm/s LVOT VTI:    0.228 m  AORTA Ao Root diam: 3.60 cm   SHUNTS Systemic VTI:  0.23 m Systemic Diam: 2.10 cm Mertie Moores MD Electronically signed by Mertie Moores MD Signature Date/Time: 06/28/2020/11:47:09 AM    Final (Updated)      Labs:   Basic Metabolic Panel: Recent Labs  Lab 06/27/20 0900 06/27/20 0900 06/28/20 0457 07/01/20 0428  NA 141  --  139 139  K 3.8   < > 3.6 3.6  CL 101  --  100 101  CO2 27  --  28 29  GLUCOSE 219*  --  138* 126*  BUN 14  --  14 12  CREATININE 1.02  --  0.96 0.99  CALCIUM 9.5  --  9.2 9.2   < > = values in this interval not displayed.   GFR Estimated Creatinine Clearance: 48.9 mL/min (by C-G formula based on SCr of 0.99 mg/dL). Liver Function Tests: Recent Labs  Lab 06/27/20 0900 06/28/20 0457  AST 20 15  ALT 18 14  ALKPHOS 69 48  BILITOT 0.9 0.8  PROT 7.2 6.0*  ALBUMIN 3.9 3.2*   No results for input(s): LIPASE, AMYLASE in the last 168 hours. No results for input(s): AMMONIA in the last 168 hours. Coagulation profile Recent Labs  Lab 06/27/20 0900 06/28/20 0457  INR 1.0 1.2    CBC: Recent Labs  Lab 06/27/20 0900 06/28/20 0457 07/01/20 0428  WBC 9.4 11.9* 6.1  NEUTROABS 8.0*  --   --   HGB 14.4 12.8* 11.3*  HCT 43.1 39.2 34.1*  MCV 93.3 97.0 95.0  PLT 214 181 211   Cardiac Enzymes: No results for input(s): CKTOTAL, CKMB, CKMBINDEX, TROPONINI in the last 168 hours. BNP: Invalid input(s): POCBNP CBG: Recent Labs  Lab 06/30/20 1207 06/30/20 1600 06/30/20 2130 07/01/20 0738 07/01/20 1126  GLUCAP 225* 281* 196* 116* 180*   D-Dimer No results for input(s): DDIMER in the last 72 hours.  Hgb A1c No results for input(s): HGBA1C in the last 72 hours. Lipid Profile No results for input(s): CHOL, HDL, LDLCALC, TRIG, CHOLHDL, LDLDIRECT in the last 72 hours. Thyroid function studies No results for input(s): TSH, T4TOTAL, T3FREE, THYROIDAB in the last 72 hours.  Invalid input(s): FREET3 Anemia work up No results for input(s): VITAMINB12, FOLATE, FERRITIN, TIBC, IRON,  RETICCTPCT in the last 72 hours. Microbiology Recent Results (from the past 240 hour(s))  Blood Culture (routine x 2)     Status: None (Preliminary result)   Collection Time: 06/27/20  9:00 AM   Specimen: BLOOD  Result Value Ref Range Status   Specimen Description   Final    BLOOD LEFT WRIST Performed at [facility] [address] [place], [place] [address]    Special Requests   Final    BOTTLES DRAWN AEROBIC AND ANAEROBIC Blood Culture results may not be optimal due to an excessive volume of blood received in culture bottles Performed at [facility] [address], [place], [place] [address]    Culture   Final    NO GROWTH 4 DAYS Performed at [facility], [place] [address] [place], [place] [address]    Report Status PENDING  Incomplete  Urine culture     Status: None   Collection Time: 06/27/20  9:00 AM   Specimen: In/Out Cath Urine  Result Value Ref Range Status   Specimen Description   Final    IN/OUT CATH URINE Performed at [facility] [address], [place], [place] [address]    Special Requests   Final    NONE Performed at [facility], [place] [address], [place], [place] [address]    Culture   Final    NO GROWTH Performed at [facility], [place] [address] [place], [place] [address]    Report Status 06/28/2020 FINAL  Final  Blood Culture (routine x 2)     Status: None (Preliminary result)   Collection Time: 06/27/20  9:04 AM   Specimen: BLOOD RIGHT HAND  Result Value Ref Range Status   Specimen Description   Final    BLOOD RIGHT HAND Performed at [facility] [address] [place], [place] [address]    Special Requests   Final    BOTTLES DRAWN AEROBIC AND ANAEROBIC Blood Culture results may not be optimal due to an excessive volume of blood received in culture bottles Performed at [facility] [address], [place], [place]  [address]    Culture   Final    NO GROWTH 4 DAYS Performed at [facility], [place] [address], [place],  [address]    Report Status PENDING  Incomplete  Respiratory Panel by RT PCR (Flu A&B, Covid) - Nasopharyngeal Swab     Status: None   Collection Time: 06/27/20  9:31 AM   Specimen: Nasopharyngeal Swab  Result Value Ref Range Status   SARS Coronavirus 2 by RT PCR NEGATIVE NEGATIVE Final    Comment: (NOTE) SARS-CoV-2 target nucleic acids are NOT DETECTED.  The SARS-CoV-2 RNA is generally detectable in upper respiratoy specimens during the acute phase of infection. The lowest concentration of SARS-CoV-2 viral copies this assay can detect is 131 copies/mL. A negative result does not preclude SARS-Cov-2 infection and should not be used as the sole basis for treatment or other patient management decisions. A negative result may occur with  improper specimen collection/handling, submission of  specimen other than nasopharyngeal swab, presence of viral mutation(s) within the areas targeted by this assay, and inadequate number of viral copies (<131 copies/mL). A negative result must be combined with clinical observations, patient history, and epidemiological information. The expected result is Negative.  Fact Sheet for Patients:  PinkCheek.be  Fact Sheet for Healthcare Providers:  GravelBags.it  This test is no t yet approved or cleared by the [place] FDA and  has been authorized for detection and/or diagnosis of SARS-CoV-2 by FDA under an Emergency Use Authorization (EUA). This EUA will remain  in effect (meaning this test can be used) for the duration of the COVID-19 declaration under Section 564(b)(1) of the Act, 21 U.S.C. section 360bbb-3(b)(1), unless the authorization is terminated or revoked sooner.     Influenza A by PCR NEGATIVE NEGATIVE Final   Influenza B by PCR NEGATIVE NEGATIVE Final    Comment:  (NOTE) The Xpert Xpress SARS-CoV-2/FLU/RSV assay is intended as an aid in  the diagnosis of influenza from Nasopharyngeal swab specimens and  should not be used as a sole basis for treatment. Nasal washings and  aspirates are unacceptable for Xpert Xpress SARS-CoV-2/FLU/RSV  testing.  Fact Sheet for Patients: PinkCheek.be  Fact Sheet for Healthcare Providers: GravelBags.it  This test is not yet approved or cleared by the [place] FDA and  has been authorized for detection and/or diagnosis of SARS-CoV-2 by  FDA under an Emergency Use Authorization (EUA). This EUA will remain  in effect (meaning this test can be used) for the duration of the  Covid-19 declaration under Section 564(b)(1) of the Act, 21  U.S.C. section 360bbb-3(b)(1), unless the authorization is  terminated or revoked. Performed at [facility], [place] [address] [place], [place] [address]   Expectorated sputum assessment w rflx to resp cult     Status: None   Collection Time: 06/27/20 10:10 AM   Specimen: Expectorated Sputum  Result Value Ref Range Status   Specimen Description EXPECTORATED SPUTUM  Final   Special Requests NONE  Final   Sputum evaluation   Final    THIS SPECIMEN IS ACCEPTABLE FOR SPUTUM CULTURE Performed at [facility], [place] [address], [place], [place] [address]    Report Status 06/27/2020 FINAL  Final  Culture, respiratory     Status: None   Collection Time: 06/27/20 10:10 AM  Result Value Ref Range Status   Specimen Description   Final    EXPECTORATED SPUTUM Performed at [facility] [address] [place], [place] [address]    Special Requests   Final    NONE Reflexed from [id number] Performed at [facility], [place] [address], [place], [place] [address]    Gram Stain   Final    MODERATE WBC PRESENT,BOTH PMN AND MONONUCLEAR ABUNDANT GRAM POSITIVE  COCCI FEW GRAM NEGATIVE RODS FEW GRAM VARIABLE ROD    Culture   Final    Normal respiratory flora-no Staph aureus or Pseudomonas seen Performed at [facility], [address], [place],  [address]    Report Status 06/29/2020 FINAL  Final     Discharge Instructions:   Discharge Instructions    Call MD for:  difficulty breathing, headache or visual disturbances   Complete by: As directed    Call MD for:  temperature >100.4   Complete by: As directed    Diet Carb Modified   Complete by: As directed    Discharge instructions   Complete by: As  directed    Follow up with your primary care physician in 1 week.  Complete the course of antibiotic.   Increase activity slowly   Complete by: As directed      Allergies as of 07/01/2020      Reactions   Actos [pioglitazone] Swelling   Formaldehyde Other (See Comments)   Flu-like symptoms   Lipitor [atorvastatin Calcium] Other (See Comments)   unknown   Metformin And Related Other (See Comments)   unknown      Medication List    TAKE these medications   albuterol (2.5 MG/3ML) 0.083% nebulizer solution Commonly known as: PROVENTIL Take 2.5 mg by nebulization every 6 (six) hours as needed for wheezing or shortness of breath.   amoxicillin-clavulanate 875-125 MG tablet Commonly known as: Augmentin Take 1 tablet by mouth 2 (two) times daily for 3 days.   aspirin EC 81 MG tablet Take 81 mg by mouth daily.   CALCIUM 600+D PO Take 1 tablet by mouth daily.   docusate sodium 100 MG capsule Commonly known as: COLACE Take 100 mg by mouth at bedtime.   fluticasone 50 MCG/ACT nasal spray Commonly known as: FLONASE Place 2 sprays into both nostrils 2 (two) times daily.   gabapentin 100 MG capsule Commonly known as: NEURONTIN Take 300 mg by mouth at bedtime.   insulin aspart 100 UNIT/ML FlexPen Commonly known as: NovoLOG FlexPen Before each meal 3 times a day, 140-199 - 2 units, 200-250 - 4 units, 251-299 - 6  units,  300-349 - 8 units,  350 or above 10 units. What changed:   how much to take  how to take this  when to take this  additional instructions   insulin glargine 100 UNIT/ML Solostar Pen Commonly known as: LANTUS Inject 15 Units into the skin daily at 10 pm.   lisinopril 5 MG tablet Commonly known as: ZESTRIL Take 1 tablet (5 mg total) by mouth daily.   omeprazole 20 MG capsule Commonly known as: PRILOSEC Take 20 mg by mouth daily.   rosuvastatin 10 MG tablet Commonly known as: CRESTOR TAKE 1 TABLET AT BEDTIME   sodium chloride 0.65 % Soln nasal spray Commonly known as: OCEAN Place 1 spray into both nostrils as needed for congestion.   tetrahydrozoline-zinc 0.05-0.25 % ophthalmic solution Commonly known as: VISINE-AC Place 2 drops into both eyes 3 (three) times daily as needed (dry eyes).       Follow-up Information    Josetta Huddle, MD. Schedule an appointment as soon as possible for a visit.   Specialty: Internal Medicine Why: regular followup Contact information: [address] [place] [address] [phone]        Deboraha Sprang, MD .   Specialty: Cardiology Contact information: [phone] [address] [place] [address] [phone]                Time coordinating discharge: 39 minutes  Signed:  Antoneo Ghrist  [facility] 07/01/2020, 2:33 PM

## 2020-07-02 LAB — CULTURE, BLOOD (ROUTINE X 2)
Culture: NO GROWTH
Culture: NO GROWTH

## 2020-07-08 DIAGNOSIS — J189 Pneumonia, unspecified organism: Secondary | ICD-10-CM | POA: Diagnosis not present

## 2020-07-08 DIAGNOSIS — R351 Nocturia: Secondary | ICD-10-CM | POA: Diagnosis not present

## 2020-07-08 DIAGNOSIS — R39198 Other difficulties with micturition: Secondary | ICD-10-CM | POA: Diagnosis not present

## 2020-07-20 DIAGNOSIS — E1140 Type 2 diabetes mellitus with diabetic neuropathy, unspecified: Secondary | ICD-10-CM | POA: Diagnosis not present

## 2020-08-06 DIAGNOSIS — N183 Chronic kidney disease, stage 3 unspecified: Secondary | ICD-10-CM | POA: Diagnosis not present

## 2020-08-06 DIAGNOSIS — E1140 Type 2 diabetes mellitus with diabetic neuropathy, unspecified: Secondary | ICD-10-CM | POA: Diagnosis not present

## 2020-08-06 DIAGNOSIS — K219 Gastro-esophageal reflux disease without esophagitis: Secondary | ICD-10-CM | POA: Diagnosis not present

## 2020-08-06 DIAGNOSIS — E1142 Type 2 diabetes mellitus with diabetic polyneuropathy: Secondary | ICD-10-CM | POA: Diagnosis not present

## 2020-08-06 DIAGNOSIS — M169 Osteoarthritis of hip, unspecified: Secondary | ICD-10-CM | POA: Diagnosis not present

## 2020-08-06 DIAGNOSIS — E11319 Type 2 diabetes mellitus with unspecified diabetic retinopathy without macular edema: Secondary | ICD-10-CM | POA: Diagnosis not present

## 2020-08-06 DIAGNOSIS — M1990 Unspecified osteoarthritis, unspecified site: Secondary | ICD-10-CM | POA: Diagnosis not present

## 2020-08-06 DIAGNOSIS — E1121 Type 2 diabetes mellitus with diabetic nephropathy: Secondary | ICD-10-CM | POA: Diagnosis not present

## 2020-08-22 DIAGNOSIS — Z0001 Encounter for general adult medical examination with abnormal findings: Secondary | ICD-10-CM | POA: Diagnosis not present

## 2020-08-22 DIAGNOSIS — E1121 Type 2 diabetes mellitus with diabetic nephropathy: Secondary | ICD-10-CM | POA: Diagnosis not present

## 2020-08-22 DIAGNOSIS — Z79899 Other long term (current) drug therapy: Secondary | ICD-10-CM | POA: Diagnosis not present

## 2020-08-22 DIAGNOSIS — R39198 Other difficulties with micturition: Secondary | ICD-10-CM | POA: Diagnosis not present

## 2020-08-22 DIAGNOSIS — R202 Paresthesia of skin: Secondary | ICD-10-CM | POA: Diagnosis not present

## 2020-08-22 DIAGNOSIS — E1140 Type 2 diabetes mellitus with diabetic neuropathy, unspecified: Secondary | ICD-10-CM | POA: Diagnosis not present

## 2020-08-22 DIAGNOSIS — E559 Vitamin D deficiency, unspecified: Secondary | ICD-10-CM | POA: Diagnosis not present

## 2020-08-22 DIAGNOSIS — I350 Nonrheumatic aortic (valve) stenosis: Secondary | ICD-10-CM | POA: Diagnosis not present

## 2020-08-29 ENCOUNTER — Other Ambulatory Visit: Payer: Self-pay | Admitting: Cardiology

## 2020-08-29 DIAGNOSIS — I440 Atrioventricular block, first degree: Secondary | ICD-10-CM

## 2020-09-03 DIAGNOSIS — E11319 Type 2 diabetes mellitus with unspecified diabetic retinopathy without macular edema: Secondary | ICD-10-CM | POA: Diagnosis not present

## 2020-09-03 DIAGNOSIS — E1140 Type 2 diabetes mellitus with diabetic neuropathy, unspecified: Secondary | ICD-10-CM | POA: Diagnosis not present

## 2020-09-03 DIAGNOSIS — M169 Osteoarthritis of hip, unspecified: Secondary | ICD-10-CM | POA: Diagnosis not present

## 2020-09-03 DIAGNOSIS — E1121 Type 2 diabetes mellitus with diabetic nephropathy: Secondary | ICD-10-CM | POA: Diagnosis not present

## 2020-09-03 DIAGNOSIS — E1142 Type 2 diabetes mellitus with diabetic polyneuropathy: Secondary | ICD-10-CM | POA: Diagnosis not present

## 2020-09-03 DIAGNOSIS — K219 Gastro-esophageal reflux disease without esophagitis: Secondary | ICD-10-CM | POA: Diagnosis not present

## 2020-09-03 DIAGNOSIS — N183 Chronic kidney disease, stage 3 unspecified: Secondary | ICD-10-CM | POA: Diagnosis not present

## 2020-10-08 DIAGNOSIS — E1140 Type 2 diabetes mellitus with diabetic neuropathy, unspecified: Secondary | ICD-10-CM | POA: Diagnosis not present

## 2020-10-24 IMAGING — CT CT ANGIO CHEST
2 of 6 series · 18 of 36 positions shown · IV contrast (OMNIPAQUE 350)
Comparison: March 19, 2008

CLINICAL DATA: Shortness of breath hemoptysis

EXAM:
CT ANGIOGRAPHY CHEST WITH CONTRAST
TECHNIQUE: Multidetector CT imaging of the chest was performed using the
standard protocol during bolus administration of intravenous
contrast. Multiplanar CT image reconstructions and MIPs were
obtained to evaluate the vascular anatomy.
CONTRAST:  69mL OMNIPAQUE IOHEXOL 350 MG/ML SOLN

[Series 5: thins · axial · 0.70mm/px · z∈[-239,+12]mm · 17 of 283 slices shown]
[im 16/283  lung]
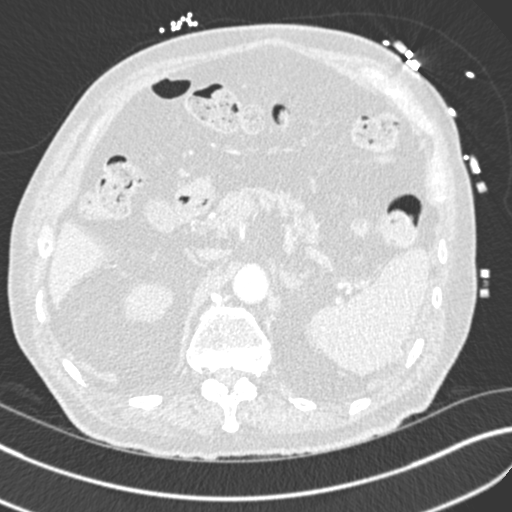
[im 32/283  mediastinal]
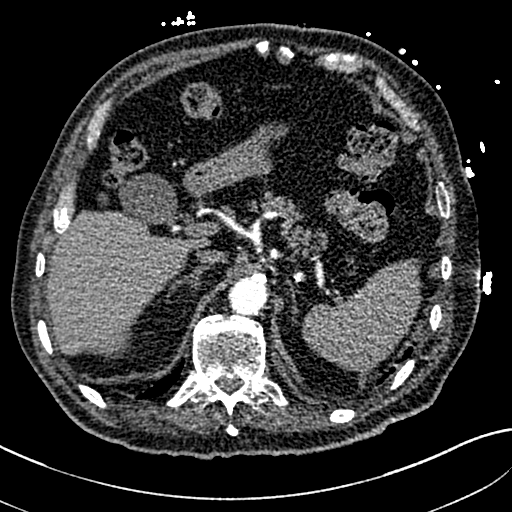
[im 48/283  lung]
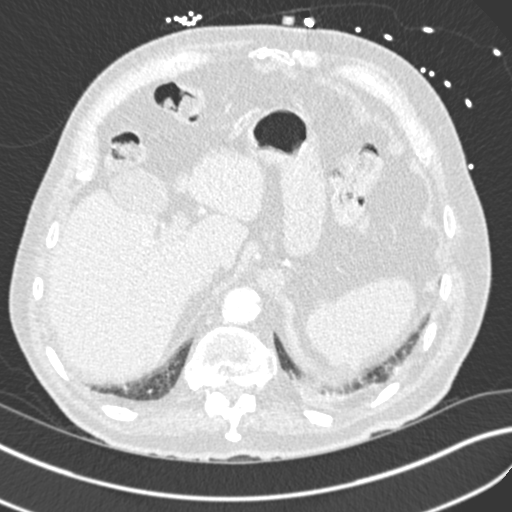
[im 63/283  mediastinal]
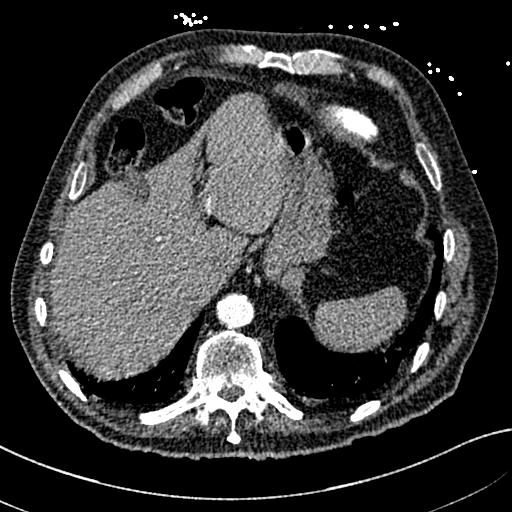
[im 79/283  lung]
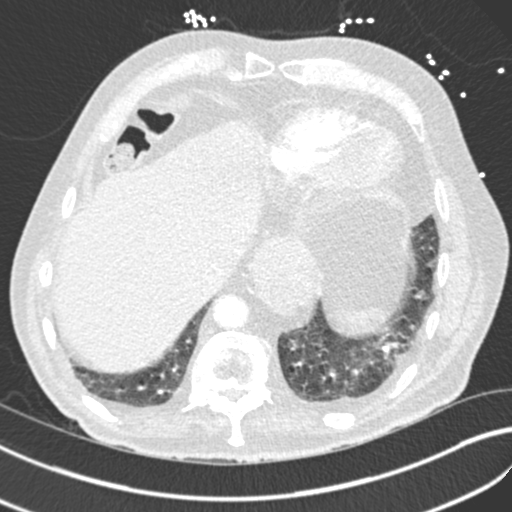
[im 95/283  mediastinal]
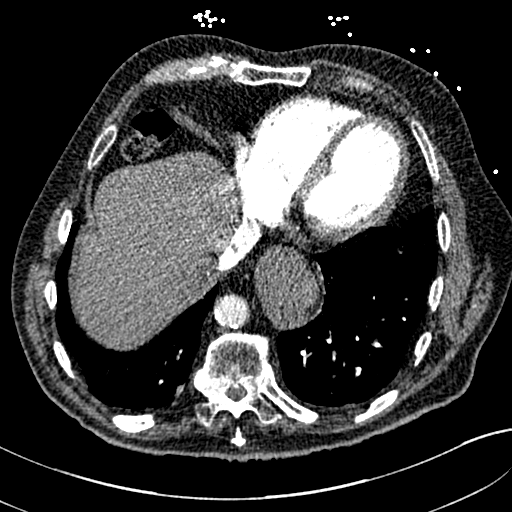
[im 110/283  lung]
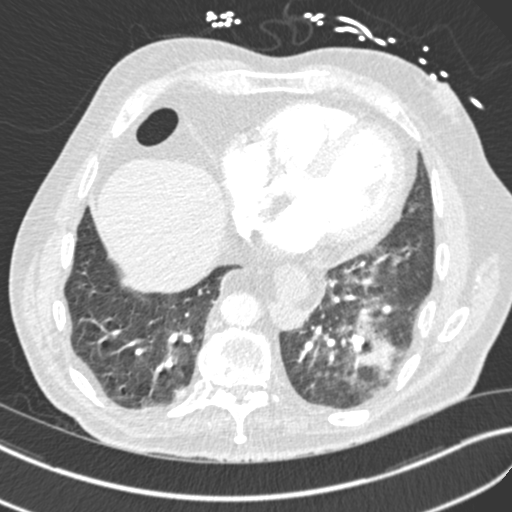
[im 126/283  mediastinal]
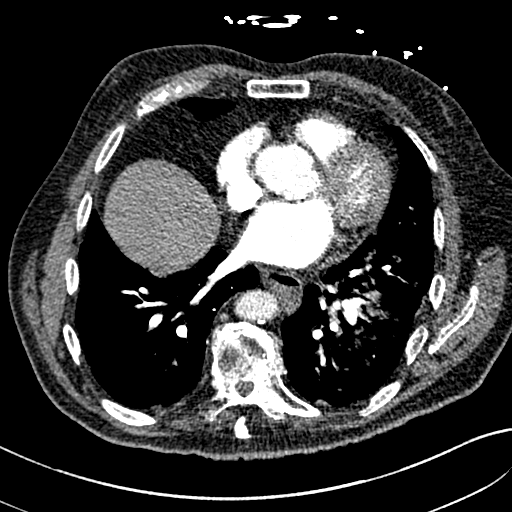
[im 142/283  lung]
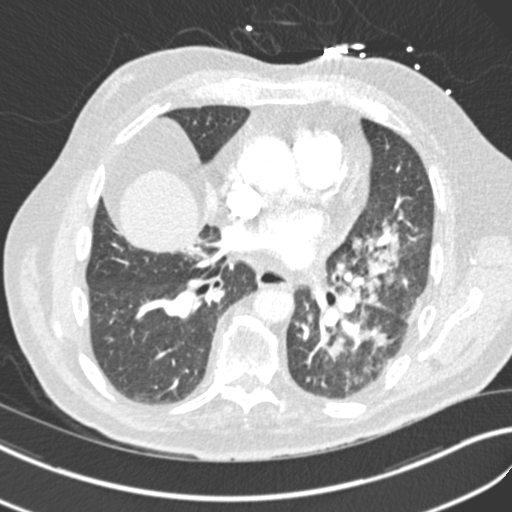
[im 157/283  mediastinal]
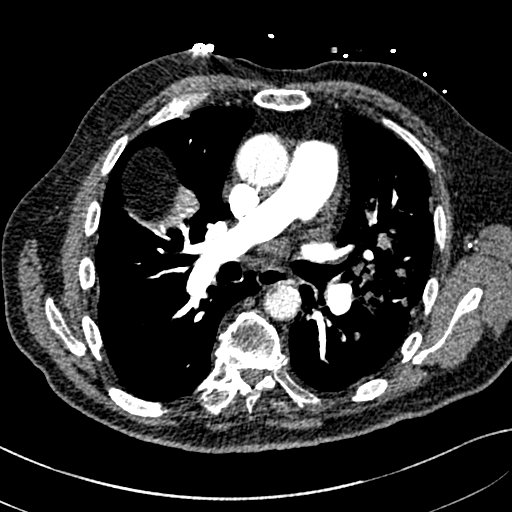
[im 173/283  lung]
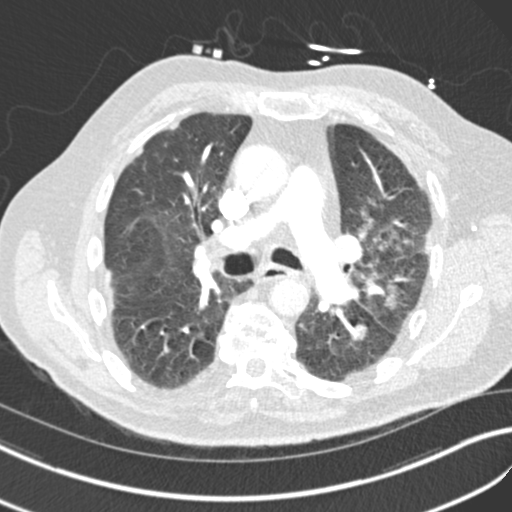
[im 189/283  mediastinal]
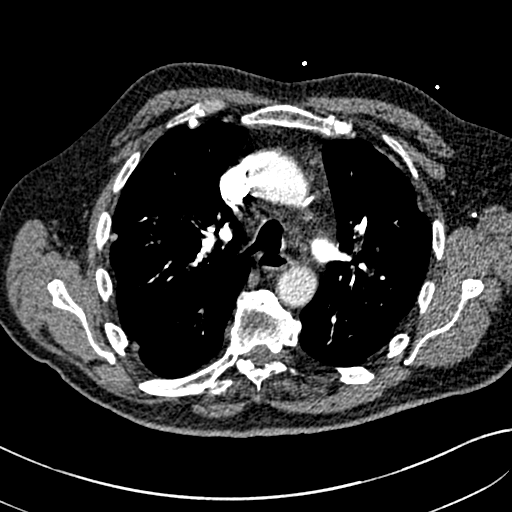
[im 204/283  lung]
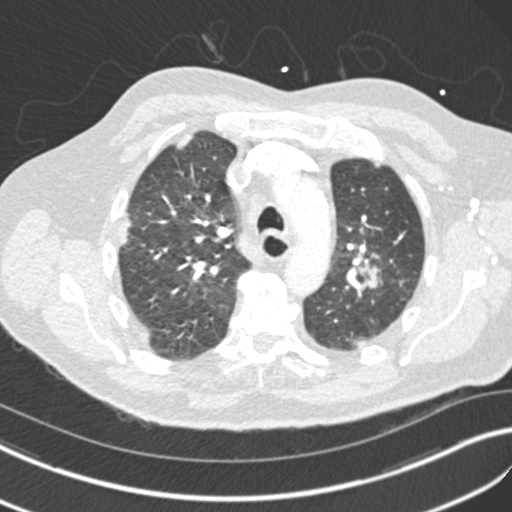
[im 220/283  mediastinal]
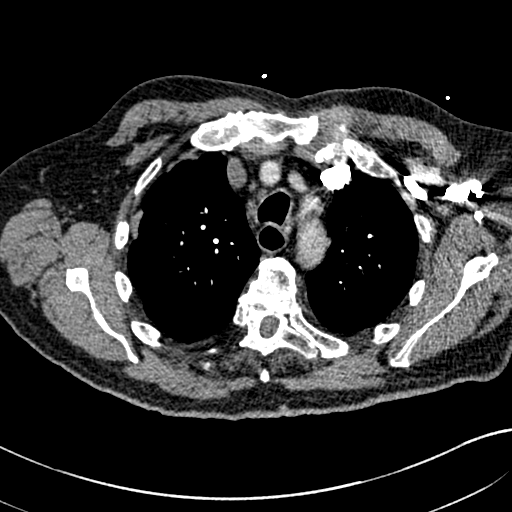
[im 236/283  lung]
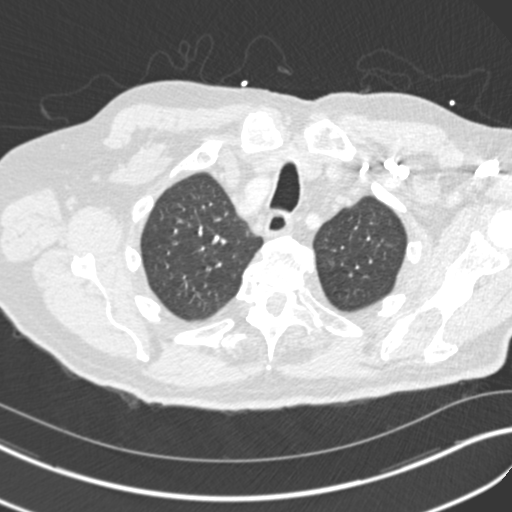
[im 251/283  mediastinal]
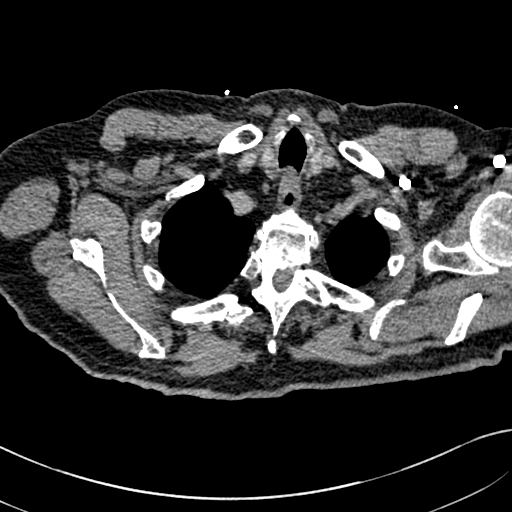
[im 267/283  lung]
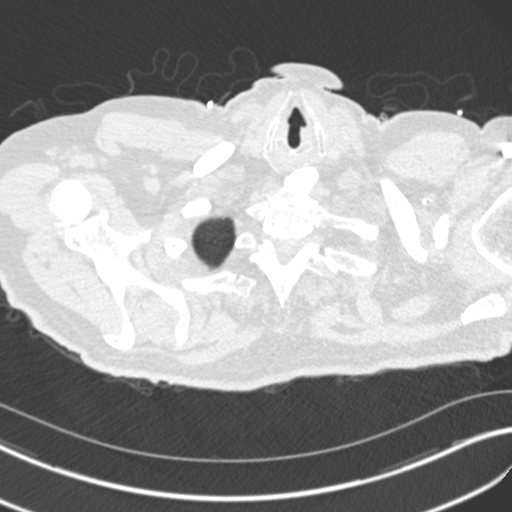

[Series 7: coronal mpr · coronal · 0.61mm/px · 1 of 165 slices shown]
[im 83/165  mediastinal]
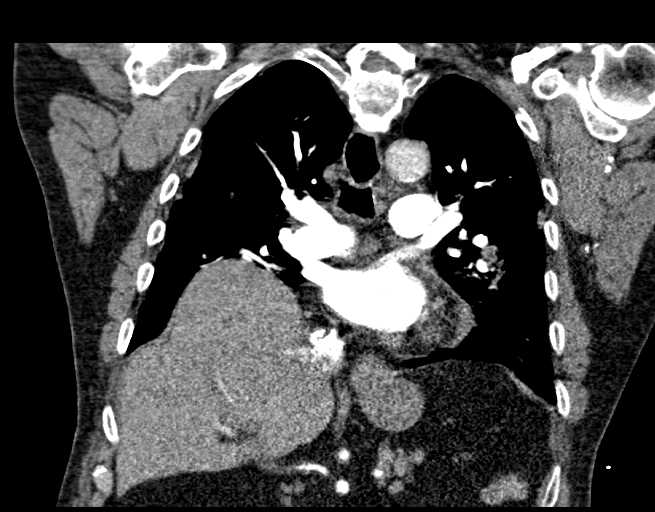

[18 of 36 positions shown; findings below may reference images not displayed]

FINDINGS: Cardiovascular: There is a optimal opacification of the pulmonary
arteries. There is no central,segmental, or subsegmental filling
defects within the pulmonary arteries. The heart is normal in size.
No pericardial effusion or thickening. No evidence right heart
strain. There is normal three-vessel brachiocephalic anatomy without
proximal stenosis. Coronary artery calcifications are seen.
Scattered aortic atherosclerosis is noted.

Mediastinum/Nodes: No hilar, mediastinal, or axillary adenopathy.
Thyroid gland, trachea, and esophagus demonstrate no significant
findings.

Lungs/Pleura: Again noted are scattered bilateral pleural plaques as
on the prior exam, some of which are mildly calcified. There is
multifocal patchy tree-in-bud and ground-glass opacities seen
throughout the left lung, predominantly within the left lobe. No
pleural effusion is seen.

Upper Abdomen: No acute abnormalities present in the visualized
portions of the upper abdomen.

Musculoskeletal: No chest wall abnormality. No acute or significant
osseous findings. Anterior flowing osteophytes are seen in the
midthoracic spine.

Review of the MIP images confirms the above findings.
IMPRESSION: No central, segmental, or subsegmental pulmonary embolism.

Multifocal patchy airspace opacities seen throughout the left lung,
predominantly within the left lower lung likely due to multifocal
pneumonia.

Aortic Atherosclerosis ([id number].N).

## 2020-11-14 DIAGNOSIS — K219 Gastro-esophageal reflux disease without esophagitis: Secondary | ICD-10-CM | POA: Diagnosis not present

## 2020-11-14 DIAGNOSIS — E1142 Type 2 diabetes mellitus with diabetic polyneuropathy: Secondary | ICD-10-CM | POA: Diagnosis not present

## 2020-11-14 DIAGNOSIS — N183 Chronic kidney disease, stage 3 unspecified: Secondary | ICD-10-CM | POA: Diagnosis not present

## 2020-11-14 DIAGNOSIS — E11319 Type 2 diabetes mellitus with unspecified diabetic retinopathy without macular edema: Secondary | ICD-10-CM | POA: Diagnosis not present

## 2020-11-14 DIAGNOSIS — E1121 Type 2 diabetes mellitus with diabetic nephropathy: Secondary | ICD-10-CM | POA: Diagnosis not present

## 2020-11-14 DIAGNOSIS — M169 Osteoarthritis of hip, unspecified: Secondary | ICD-10-CM | POA: Diagnosis not present

## 2020-11-14 DIAGNOSIS — E1140 Type 2 diabetes mellitus with diabetic neuropathy, unspecified: Secondary | ICD-10-CM | POA: Diagnosis not present

## 2020-11-14 DIAGNOSIS — M1990 Unspecified osteoarthritis, unspecified site: Secondary | ICD-10-CM | POA: Diagnosis not present

## 2020-12-18 DIAGNOSIS — K219 Gastro-esophageal reflux disease without esophagitis: Secondary | ICD-10-CM | POA: Diagnosis not present

## 2020-12-18 DIAGNOSIS — E1140 Type 2 diabetes mellitus with diabetic neuropathy, unspecified: Secondary | ICD-10-CM | POA: Diagnosis not present

## 2020-12-18 DIAGNOSIS — M169 Osteoarthritis of hip, unspecified: Secondary | ICD-10-CM | POA: Diagnosis not present

## 2020-12-18 DIAGNOSIS — N183 Chronic kidney disease, stage 3 unspecified: Secondary | ICD-10-CM | POA: Diagnosis not present

## 2020-12-18 DIAGNOSIS — E1121 Type 2 diabetes mellitus with diabetic nephropathy: Secondary | ICD-10-CM | POA: Diagnosis not present

## 2020-12-18 DIAGNOSIS — M1990 Unspecified osteoarthritis, unspecified site: Secondary | ICD-10-CM | POA: Diagnosis not present

## 2020-12-25 DIAGNOSIS — E559 Vitamin D deficiency, unspecified: Secondary | ICD-10-CM | POA: Diagnosis not present

## 2020-12-25 DIAGNOSIS — R351 Nocturia: Secondary | ICD-10-CM | POA: Diagnosis not present

## 2020-12-25 DIAGNOSIS — E11319 Type 2 diabetes mellitus with unspecified diabetic retinopathy without macular edema: Secondary | ICD-10-CM | POA: Diagnosis not present

## 2020-12-25 DIAGNOSIS — R39198 Other difficulties with micturition: Secondary | ICD-10-CM | POA: Diagnosis not present

## 2020-12-25 DIAGNOSIS — R202 Paresthesia of skin: Secondary | ICD-10-CM | POA: Diagnosis not present

## 2020-12-25 DIAGNOSIS — Z79899 Other long term (current) drug therapy: Secondary | ICD-10-CM | POA: Diagnosis not present

## 2020-12-25 DIAGNOSIS — E1121 Type 2 diabetes mellitus with diabetic nephropathy: Secondary | ICD-10-CM | POA: Diagnosis not present

## 2020-12-25 DIAGNOSIS — I350 Nonrheumatic aortic (valve) stenosis: Secondary | ICD-10-CM | POA: Diagnosis not present

## 2020-12-25 DIAGNOSIS — G4737 Central sleep apnea in conditions classified elsewhere: Secondary | ICD-10-CM | POA: Diagnosis not present

## 2020-12-25 DIAGNOSIS — E1140 Type 2 diabetes mellitus with diabetic neuropathy, unspecified: Secondary | ICD-10-CM | POA: Diagnosis not present

## 2020-12-25 DIAGNOSIS — K219 Gastro-esophageal reflux disease without esophagitis: Secondary | ICD-10-CM | POA: Diagnosis not present

## 2020-12-27 DIAGNOSIS — E1140 Type 2 diabetes mellitus with diabetic neuropathy, unspecified: Secondary | ICD-10-CM | POA: Diagnosis not present

## 2021-01-26 DIAGNOSIS — E1140 Type 2 diabetes mellitus with diabetic neuropathy, unspecified: Secondary | ICD-10-CM | POA: Diagnosis not present

## 2021-02-05 DIAGNOSIS — K219 Gastro-esophageal reflux disease without esophagitis: Secondary | ICD-10-CM | POA: Diagnosis not present

## 2021-02-05 DIAGNOSIS — M169 Osteoarthritis of hip, unspecified: Secondary | ICD-10-CM | POA: Diagnosis not present

## 2021-02-05 DIAGNOSIS — M1990 Unspecified osteoarthritis, unspecified site: Secondary | ICD-10-CM | POA: Diagnosis not present

## 2021-02-05 DIAGNOSIS — E11319 Type 2 diabetes mellitus with unspecified diabetic retinopathy without macular edema: Secondary | ICD-10-CM | POA: Diagnosis not present

## 2021-02-05 DIAGNOSIS — E1140 Type 2 diabetes mellitus with diabetic neuropathy, unspecified: Secondary | ICD-10-CM | POA: Diagnosis not present

## 2021-02-05 DIAGNOSIS — E1142 Type 2 diabetes mellitus with diabetic polyneuropathy: Secondary | ICD-10-CM | POA: Diagnosis not present

## 2021-02-05 DIAGNOSIS — E1121 Type 2 diabetes mellitus with diabetic nephropathy: Secondary | ICD-10-CM | POA: Diagnosis not present

## 2021-02-05 DIAGNOSIS — N183 Chronic kidney disease, stage 3 unspecified: Secondary | ICD-10-CM | POA: Diagnosis not present

## 2021-02-26 DIAGNOSIS — E1140 Type 2 diabetes mellitus with diabetic neuropathy, unspecified: Secondary | ICD-10-CM | POA: Diagnosis not present

## 2021-03-12 DIAGNOSIS — E11319 Type 2 diabetes mellitus with unspecified diabetic retinopathy without macular edema: Secondary | ICD-10-CM | POA: Diagnosis not present

## 2021-03-12 DIAGNOSIS — M169 Osteoarthritis of hip, unspecified: Secondary | ICD-10-CM | POA: Diagnosis not present

## 2021-03-12 DIAGNOSIS — E1142 Type 2 diabetes mellitus with diabetic polyneuropathy: Secondary | ICD-10-CM | POA: Diagnosis not present

## 2021-03-12 DIAGNOSIS — E1140 Type 2 diabetes mellitus with diabetic neuropathy, unspecified: Secondary | ICD-10-CM | POA: Diagnosis not present

## 2021-03-12 DIAGNOSIS — E1121 Type 2 diabetes mellitus with diabetic nephropathy: Secondary | ICD-10-CM | POA: Diagnosis not present

## 2021-03-12 DIAGNOSIS — K219 Gastro-esophageal reflux disease without esophagitis: Secondary | ICD-10-CM | POA: Diagnosis not present

## 2021-03-12 DIAGNOSIS — N183 Chronic kidney disease, stage 3 unspecified: Secondary | ICD-10-CM | POA: Diagnosis not present

## 2021-03-12 DIAGNOSIS — M1990 Unspecified osteoarthritis, unspecified site: Secondary | ICD-10-CM | POA: Diagnosis not present

## 2021-04-14 DIAGNOSIS — E113313 Type 2 diabetes mellitus with moderate nonproliferative diabetic retinopathy with macular edema, bilateral: Secondary | ICD-10-CM | POA: Diagnosis not present

## 2021-04-14 DIAGNOSIS — H43813 Vitreous degeneration, bilateral: Secondary | ICD-10-CM | POA: Diagnosis not present

## 2021-04-14 DIAGNOSIS — H353131 Nonexudative age-related macular degeneration, bilateral, early dry stage: Secondary | ICD-10-CM | POA: Diagnosis not present

## 2021-04-14 DIAGNOSIS — H524 Presbyopia: Secondary | ICD-10-CM | POA: Diagnosis not present

## 2021-04-16 DIAGNOSIS — N183 Chronic kidney disease, stage 3 unspecified: Secondary | ICD-10-CM | POA: Diagnosis not present

## 2021-04-16 DIAGNOSIS — M169 Osteoarthritis of hip, unspecified: Secondary | ICD-10-CM | POA: Diagnosis not present

## 2021-04-16 DIAGNOSIS — E1121 Type 2 diabetes mellitus with diabetic nephropathy: Secondary | ICD-10-CM | POA: Diagnosis not present

## 2021-04-16 DIAGNOSIS — K219 Gastro-esophageal reflux disease without esophagitis: Secondary | ICD-10-CM | POA: Diagnosis not present

## 2021-04-16 DIAGNOSIS — M1990 Unspecified osteoarthritis, unspecified site: Secondary | ICD-10-CM | POA: Diagnosis not present

## 2021-04-16 DIAGNOSIS — E1142 Type 2 diabetes mellitus with diabetic polyneuropathy: Secondary | ICD-10-CM | POA: Diagnosis not present

## 2021-04-16 DIAGNOSIS — E11319 Type 2 diabetes mellitus with unspecified diabetic retinopathy without macular edema: Secondary | ICD-10-CM | POA: Diagnosis not present

## 2021-04-23 DIAGNOSIS — H35372 Puckering of macula, left eye: Secondary | ICD-10-CM | POA: Diagnosis not present

## 2021-04-23 DIAGNOSIS — E113311 Type 2 diabetes mellitus with moderate nonproliferative diabetic retinopathy with macular edema, right eye: Secondary | ICD-10-CM | POA: Diagnosis not present

## 2021-04-23 DIAGNOSIS — H3563 Retinal hemorrhage, bilateral: Secondary | ICD-10-CM | POA: Diagnosis not present

## 2021-04-23 DIAGNOSIS — H3582 Retinal ischemia: Secondary | ICD-10-CM | POA: Diagnosis not present

## 2021-04-23 DIAGNOSIS — Z961 Presence of intraocular lens: Secondary | ICD-10-CM | POA: Diagnosis not present

## 2021-04-23 DIAGNOSIS — H3581 Retinal edema: Secondary | ICD-10-CM | POA: Diagnosis not present

## 2021-04-23 DIAGNOSIS — E113412 Type 2 diabetes mellitus with severe nonproliferative diabetic retinopathy with macular edema, left eye: Secondary | ICD-10-CM | POA: Diagnosis not present

## 2021-04-23 DIAGNOSIS — D3132 Benign neoplasm of left choroid: Secondary | ICD-10-CM | POA: Diagnosis not present

## 2021-04-23 DIAGNOSIS — H43813 Vitreous degeneration, bilateral: Secondary | ICD-10-CM | POA: Diagnosis not present

## 2021-05-07 DIAGNOSIS — E113412 Type 2 diabetes mellitus with severe nonproliferative diabetic retinopathy with macular edema, left eye: Secondary | ICD-10-CM | POA: Diagnosis not present

## 2021-05-15 DIAGNOSIS — N183 Chronic kidney disease, stage 3 unspecified: Secondary | ICD-10-CM | POA: Diagnosis not present

## 2021-05-15 DIAGNOSIS — E1140 Type 2 diabetes mellitus with diabetic neuropathy, unspecified: Secondary | ICD-10-CM | POA: Diagnosis not present

## 2021-05-15 DIAGNOSIS — E11319 Type 2 diabetes mellitus with unspecified diabetic retinopathy without macular edema: Secondary | ICD-10-CM | POA: Diagnosis not present

## 2021-05-15 DIAGNOSIS — K219 Gastro-esophageal reflux disease without esophagitis: Secondary | ICD-10-CM | POA: Diagnosis not present

## 2021-05-15 DIAGNOSIS — E1121 Type 2 diabetes mellitus with diabetic nephropathy: Secondary | ICD-10-CM | POA: Diagnosis not present

## 2021-05-15 DIAGNOSIS — M169 Osteoarthritis of hip, unspecified: Secondary | ICD-10-CM | POA: Diagnosis not present

## 2021-05-15 DIAGNOSIS — M1990 Unspecified osteoarthritis, unspecified site: Secondary | ICD-10-CM | POA: Diagnosis not present

## 2021-05-15 DIAGNOSIS — E1142 Type 2 diabetes mellitus with diabetic polyneuropathy: Secondary | ICD-10-CM | POA: Diagnosis not present

## 2021-05-21 DIAGNOSIS — E113412 Type 2 diabetes mellitus with severe nonproliferative diabetic retinopathy with macular edema, left eye: Secondary | ICD-10-CM | POA: Diagnosis not present

## 2021-06-02 DIAGNOSIS — E113311 Type 2 diabetes mellitus with moderate nonproliferative diabetic retinopathy with macular edema, right eye: Secondary | ICD-10-CM | POA: Diagnosis not present

## 2021-06-13 DIAGNOSIS — M1990 Unspecified osteoarthritis, unspecified site: Secondary | ICD-10-CM | POA: Diagnosis not present

## 2021-06-13 DIAGNOSIS — E1121 Type 2 diabetes mellitus with diabetic nephropathy: Secondary | ICD-10-CM | POA: Diagnosis not present

## 2021-06-13 DIAGNOSIS — N183 Chronic kidney disease, stage 3 unspecified: Secondary | ICD-10-CM | POA: Diagnosis not present

## 2021-06-13 DIAGNOSIS — E1140 Type 2 diabetes mellitus with diabetic neuropathy, unspecified: Secondary | ICD-10-CM | POA: Diagnosis not present

## 2021-06-13 DIAGNOSIS — K219 Gastro-esophageal reflux disease without esophagitis: Secondary | ICD-10-CM | POA: Diagnosis not present

## 2021-06-13 DIAGNOSIS — E1142 Type 2 diabetes mellitus with diabetic polyneuropathy: Secondary | ICD-10-CM | POA: Diagnosis not present

## 2021-06-13 DIAGNOSIS — E11319 Type 2 diabetes mellitus with unspecified diabetic retinopathy without macular edema: Secondary | ICD-10-CM | POA: Diagnosis not present

## 2021-06-16 DIAGNOSIS — E113311 Type 2 diabetes mellitus with moderate nonproliferative diabetic retinopathy with macular edema, right eye: Secondary | ICD-10-CM | POA: Diagnosis not present

## 2021-06-25 DIAGNOSIS — E113412 Type 2 diabetes mellitus with severe nonproliferative diabetic retinopathy with macular edema, left eye: Secondary | ICD-10-CM | POA: Diagnosis not present

## 2021-06-26 DIAGNOSIS — Z23 Encounter for immunization: Secondary | ICD-10-CM | POA: Diagnosis not present

## 2021-07-21 DIAGNOSIS — E113311 Type 2 diabetes mellitus with moderate nonproliferative diabetic retinopathy with macular edema, right eye: Secondary | ICD-10-CM | POA: Diagnosis not present

## 2021-07-31 DIAGNOSIS — R042 Hemoptysis: Secondary | ICD-10-CM | POA: Diagnosis not present

## 2021-07-31 DIAGNOSIS — E113412 Type 2 diabetes mellitus with severe nonproliferative diabetic retinopathy with macular edema, left eye: Secondary | ICD-10-CM | POA: Diagnosis not present

## 2021-08-27 DIAGNOSIS — E113311 Type 2 diabetes mellitus with moderate nonproliferative diabetic retinopathy with macular edema, right eye: Secondary | ICD-10-CM | POA: Diagnosis not present

## 2021-08-28 DIAGNOSIS — Z0000 Encounter for general adult medical examination without abnormal findings: Secondary | ICD-10-CM | POA: Diagnosis not present

## 2021-08-28 DIAGNOSIS — G4737 Central sleep apnea in conditions classified elsewhere: Secondary | ICD-10-CM | POA: Diagnosis not present

## 2021-08-28 DIAGNOSIS — I700 Atherosclerosis of aorta: Secondary | ICD-10-CM | POA: Diagnosis not present

## 2021-08-28 DIAGNOSIS — M1990 Unspecified osteoarthritis, unspecified site: Secondary | ICD-10-CM | POA: Diagnosis not present

## 2021-08-28 DIAGNOSIS — I350 Nonrheumatic aortic (valve) stenosis: Secondary | ICD-10-CM | POA: Diagnosis not present

## 2021-08-28 DIAGNOSIS — E559 Vitamin D deficiency, unspecified: Secondary | ICD-10-CM | POA: Diagnosis not present

## 2021-08-28 DIAGNOSIS — E11319 Type 2 diabetes mellitus with unspecified diabetic retinopathy without macular edema: Secondary | ICD-10-CM | POA: Diagnosis not present

## 2021-08-28 DIAGNOSIS — R351 Nocturia: Secondary | ICD-10-CM | POA: Diagnosis not present

## 2021-08-28 DIAGNOSIS — R202 Paresthesia of skin: Secondary | ICD-10-CM | POA: Diagnosis not present

## 2021-08-28 DIAGNOSIS — K219 Gastro-esophageal reflux disease without esophagitis: Secondary | ICD-10-CM | POA: Diagnosis not present

## 2021-08-28 DIAGNOSIS — E1121 Type 2 diabetes mellitus with diabetic nephropathy: Secondary | ICD-10-CM | POA: Diagnosis not present

## 2021-08-28 DIAGNOSIS — E1140 Type 2 diabetes mellitus with diabetic neuropathy, unspecified: Secondary | ICD-10-CM | POA: Diagnosis not present

## 2021-08-28 DIAGNOSIS — Z1389 Encounter for screening for other disorder: Secondary | ICD-10-CM | POA: Diagnosis not present

## 2021-08-28 DIAGNOSIS — N183 Chronic kidney disease, stage 3 unspecified: Secondary | ICD-10-CM | POA: Diagnosis not present

## 2021-09-03 DIAGNOSIS — E113412 Type 2 diabetes mellitus with severe nonproliferative diabetic retinopathy with macular edema, left eye: Secondary | ICD-10-CM | POA: Diagnosis not present

## 2021-10-01 DIAGNOSIS — E113311 Type 2 diabetes mellitus with moderate nonproliferative diabetic retinopathy with macular edema, right eye: Secondary | ICD-10-CM | POA: Diagnosis not present

## 2021-10-16 DIAGNOSIS — E113412 Type 2 diabetes mellitus with severe nonproliferative diabetic retinopathy with macular edema, left eye: Secondary | ICD-10-CM | POA: Diagnosis not present

## 2021-10-27 DIAGNOSIS — E113311 Type 2 diabetes mellitus with moderate nonproliferative diabetic retinopathy with macular edema, right eye: Secondary | ICD-10-CM | POA: Diagnosis not present

## 2021-10-27 DIAGNOSIS — E113412 Type 2 diabetes mellitus with severe nonproliferative diabetic retinopathy with macular edema, left eye: Secondary | ICD-10-CM | POA: Diagnosis not present

## 2021-10-27 DIAGNOSIS — H35372 Puckering of macula, left eye: Secondary | ICD-10-CM | POA: Diagnosis not present

## 2021-10-27 DIAGNOSIS — Z961 Presence of intraocular lens: Secondary | ICD-10-CM | POA: Diagnosis not present

## 2021-10-27 DIAGNOSIS — H3582 Retinal ischemia: Secondary | ICD-10-CM | POA: Diagnosis not present

## 2021-11-03 DIAGNOSIS — E113311 Type 2 diabetes mellitus with moderate nonproliferative diabetic retinopathy with macular edema, right eye: Secondary | ICD-10-CM | POA: Diagnosis not present

## 2021-11-13 DIAGNOSIS — E113311 Type 2 diabetes mellitus with moderate nonproliferative diabetic retinopathy with macular edema, right eye: Secondary | ICD-10-CM | POA: Diagnosis not present

## 2021-11-18 ENCOUNTER — Ambulatory Visit
Admission: RE | Admit: 2021-11-18 | Discharge: 2021-11-18 | Disposition: A | Discharge status: Home / Self Care | Payer: Medicare (Managed Care) | Source: Ambulatory Visit | Attending: Internal Medicine | Admitting: Internal Medicine

## 2021-11-18 ENCOUNTER — Other Ambulatory Visit: Payer: Self-pay | Admitting: Internal Medicine

## 2021-11-18 DIAGNOSIS — J069 Acute upper respiratory infection, unspecified: Secondary | ICD-10-CM | POA: Diagnosis not present

## 2021-11-18 DIAGNOSIS — J209 Acute bronchitis, unspecified: Secondary | ICD-10-CM

## 2021-11-18 DIAGNOSIS — R059 Cough, unspecified: Secondary | ICD-10-CM | POA: Diagnosis not present

## 2021-11-24 DIAGNOSIS — E113412 Type 2 diabetes mellitus with severe nonproliferative diabetic retinopathy with macular edema, left eye: Secondary | ICD-10-CM | POA: Diagnosis not present

## 2021-12-18 DIAGNOSIS — E113412 Type 2 diabetes mellitus with severe nonproliferative diabetic retinopathy with macular edema, left eye: Secondary | ICD-10-CM | POA: Diagnosis not present

## 2022-01-05 DIAGNOSIS — M545 Low back pain, unspecified: Secondary | ICD-10-CM | POA: Diagnosis not present

## 2022-01-05 DIAGNOSIS — E119 Type 2 diabetes mellitus without complications: Secondary | ICD-10-CM | POA: Diagnosis not present

## 2022-03-04 DIAGNOSIS — E1140 Type 2 diabetes mellitus with diabetic neuropathy, unspecified: Secondary | ICD-10-CM | POA: Diagnosis not present

## 2022-03-04 DIAGNOSIS — E559 Vitamin D deficiency, unspecified: Secondary | ICD-10-CM | POA: Diagnosis not present

## 2022-03-04 DIAGNOSIS — I350 Nonrheumatic aortic (valve) stenosis: Secondary | ICD-10-CM | POA: Diagnosis not present

## 2022-03-04 DIAGNOSIS — R351 Nocturia: Secondary | ICD-10-CM | POA: Diagnosis not present

## 2022-03-04 DIAGNOSIS — R39198 Other difficulties with micturition: Secondary | ICD-10-CM | POA: Diagnosis not present

## 2022-03-04 DIAGNOSIS — E1121 Type 2 diabetes mellitus with diabetic nephropathy: Secondary | ICD-10-CM | POA: Diagnosis not present

## 2022-03-04 DIAGNOSIS — Z79899 Other long term (current) drug therapy: Secondary | ICD-10-CM | POA: Diagnosis not present

## 2022-03-04 DIAGNOSIS — K219 Gastro-esophageal reflux disease without esophagitis: Secondary | ICD-10-CM | POA: Diagnosis not present

## 2022-03-04 DIAGNOSIS — G4737 Central sleep apnea in conditions classified elsewhere: Secondary | ICD-10-CM | POA: Diagnosis not present

## 2022-03-04 DIAGNOSIS — N1831 Chronic kidney disease, stage 3a: Secondary | ICD-10-CM | POA: Diagnosis not present

## 2022-03-04 DIAGNOSIS — M1990 Unspecified osteoarthritis, unspecified site: Secondary | ICD-10-CM | POA: Diagnosis not present

## 2022-03-04 DIAGNOSIS — I700 Atherosclerosis of aorta: Secondary | ICD-10-CM | POA: Diagnosis not present

## 2022-03-06 ENCOUNTER — Encounter (HOSPITAL_COMMUNITY): Payer: Self-pay | Admitting: Internal Medicine

## 2022-03-06 ENCOUNTER — Inpatient Hospital Stay (HOSPITAL_COMMUNITY)
Admission: EM | Admit: 2022-03-06 | Discharge: 2022-03-11 | DRG: 871 | Disposition: A | Discharge status: Home Health | Payer: Medicare (Managed Care) | Attending: Family Medicine | Admitting: Family Medicine

## 2022-03-06 ENCOUNTER — Other Ambulatory Visit: Payer: Self-pay

## 2022-03-06 ENCOUNTER — Emergency Department (HOSPITAL_COMMUNITY): Payer: Medicare (Managed Care)

## 2022-03-06 DIAGNOSIS — R001 Bradycardia, unspecified: Secondary | ICD-10-CM | POA: Diagnosis not present

## 2022-03-06 DIAGNOSIS — R0602 Shortness of breath: Secondary | ICD-10-CM | POA: Diagnosis not present

## 2022-03-06 DIAGNOSIS — R918 Other nonspecific abnormal finding of lung field: Secondary | ICD-10-CM | POA: Diagnosis not present

## 2022-03-06 DIAGNOSIS — B9789 Other viral agents as the cause of diseases classified elsewhere: Secondary | ICD-10-CM | POA: Diagnosis present

## 2022-03-06 DIAGNOSIS — Z79899 Other long term (current) drug therapy: Secondary | ICD-10-CM

## 2022-03-06 DIAGNOSIS — R509 Fever, unspecified: Secondary | ICD-10-CM | POA: Diagnosis present

## 2022-03-06 DIAGNOSIS — I441 Atrioventricular block, second degree: Secondary | ICD-10-CM | POA: Diagnosis present

## 2022-03-06 DIAGNOSIS — K219 Gastro-esophageal reflux disease without esophagitis: Secondary | ICD-10-CM | POA: Diagnosis present

## 2022-03-06 DIAGNOSIS — Z794 Long term (current) use of insulin: Secondary | ICD-10-CM

## 2022-03-06 DIAGNOSIS — J440 Chronic obstructive pulmonary disease with acute lower respiratory infection: Secondary | ICD-10-CM | POA: Diagnosis present

## 2022-03-06 DIAGNOSIS — E871 Hypo-osmolality and hyponatremia: Secondary | ICD-10-CM | POA: Diagnosis present

## 2022-03-06 DIAGNOSIS — I10 Essential (primary) hypertension: Secondary | ICD-10-CM | POA: Diagnosis not present

## 2022-03-06 DIAGNOSIS — Z9841 Cataract extraction status, right eye: Secondary | ICD-10-CM | POA: Diagnosis not present

## 2022-03-06 DIAGNOSIS — E119 Type 2 diabetes mellitus without complications: Secondary | ICD-10-CM

## 2022-03-06 DIAGNOSIS — Z66 Do not resuscitate: Secondary | ICD-10-CM

## 2022-03-06 DIAGNOSIS — N1831 Chronic kidney disease, stage 3a: Secondary | ICD-10-CM | POA: Diagnosis not present

## 2022-03-06 DIAGNOSIS — I442 Atrioventricular block, complete: Secondary | ICD-10-CM | POA: Diagnosis not present

## 2022-03-06 DIAGNOSIS — A419 Sepsis, unspecified organism: Secondary | ICD-10-CM | POA: Diagnosis present

## 2022-03-06 DIAGNOSIS — B348 Other viral infections of unspecified site: Secondary | ICD-10-CM

## 2022-03-06 DIAGNOSIS — I5032 Chronic diastolic (congestive) heart failure: Secondary | ICD-10-CM | POA: Diagnosis present

## 2022-03-06 DIAGNOSIS — E785 Hyperlipidemia, unspecified: Secondary | ICD-10-CM

## 2022-03-06 DIAGNOSIS — Z888 Allergy status to other drugs, medicaments and biological substances status: Secondary | ICD-10-CM

## 2022-03-06 DIAGNOSIS — Z87891 Personal history of nicotine dependence: Secondary | ICD-10-CM

## 2022-03-06 DIAGNOSIS — Z8672 Personal history of thrombophlebitis: Secondary | ICD-10-CM

## 2022-03-06 DIAGNOSIS — J189 Pneumonia, unspecified organism: Secondary | ICD-10-CM | POA: Diagnosis present

## 2022-03-06 DIAGNOSIS — E1140 Type 2 diabetes mellitus with diabetic neuropathy, unspecified: Secondary | ICD-10-CM | POA: Diagnosis present

## 2022-03-06 DIAGNOSIS — Z20822 Contact with and (suspected) exposure to covid-19: Secondary | ICD-10-CM | POA: Diagnosis present

## 2022-03-06 DIAGNOSIS — J9601 Acute respiratory failure with hypoxia: Secondary | ICD-10-CM | POA: Diagnosis present

## 2022-03-06 DIAGNOSIS — B9710 Unspecified enterovirus as the cause of diseases classified elsewhere: Secondary | ICD-10-CM | POA: Diagnosis present

## 2022-03-06 DIAGNOSIS — I110 Hypertensive heart disease with heart failure: Secondary | ICD-10-CM | POA: Diagnosis present

## 2022-03-06 DIAGNOSIS — R531 Weakness: Secondary | ICD-10-CM | POA: Diagnosis not present

## 2022-03-06 DIAGNOSIS — J9600 Acute respiratory failure, unspecified whether with hypoxia or hypercapnia: Secondary | ICD-10-CM

## 2022-03-06 DIAGNOSIS — R7989 Other specified abnormal findings of blood chemistry: Secondary | ICD-10-CM | POA: Diagnosis present

## 2022-03-06 DIAGNOSIS — F419 Anxiety disorder, unspecified: Secondary | ICD-10-CM | POA: Diagnosis present

## 2022-03-06 DIAGNOSIS — Z96642 Presence of left artificial hip joint: Secondary | ICD-10-CM | POA: Diagnosis present

## 2022-03-06 DIAGNOSIS — J9811 Atelectasis: Secondary | ICD-10-CM | POA: Diagnosis not present

## 2022-03-06 DIAGNOSIS — E1165 Type 2 diabetes mellitus with hyperglycemia: Secondary | ICD-10-CM

## 2022-03-06 DIAGNOSIS — G4700 Insomnia, unspecified: Secondary | ICD-10-CM | POA: Diagnosis present

## 2022-03-06 DIAGNOSIS — Z7982 Long term (current) use of aspirin: Secondary | ICD-10-CM | POA: Diagnosis not present

## 2022-03-06 DIAGNOSIS — Z833 Family history of diabetes mellitus: Secondary | ICD-10-CM | POA: Diagnosis not present

## 2022-03-06 LAB — COMPREHENSIVE METABOLIC PANEL
ALT: 15 U/L (ref 0–44)
AST: 18 U/L (ref 15–41)
Albumin: 3.5 g/dL (ref 3.5–5.0)
Alkaline Phosphatase: 66 U/L (ref 38–126)
Anion gap: 12 (ref 5–15)
BUN: 13 mg/dL (ref 8–23)
CO2: 26 mmol/L (ref 22–32)
Calcium: 9.4 mg/dL (ref 8.9–10.3)
Chloride: 95 mmol/L — ABNORMAL LOW (ref 98–111)
Creatinine, Ser: 1.28 mg/dL — ABNORMAL HIGH (ref 0.61–1.24)
GFR, Estimated: 53 mL/min — ABNORMAL LOW (ref >60)
Glucose, Bld: 193 mg/dL — ABNORMAL HIGH (ref 70–99)
Potassium: 4.1 mmol/L (ref 3.5–5.1)
Sodium: 133 mmol/L — ABNORMAL LOW (ref 135–145)
Total Bilirubin: 1.2 mg/dL (ref 0.3–1.2)
Total Protein: 6.7 g/dL (ref 6.5–8.1)

## 2022-03-06 LAB — GLUCOSE, CAPILLARY
Glucose-Capillary: 147 mg/dL — ABNORMAL HIGH (ref 70–99)
Glucose-Capillary: 197 mg/dL — ABNORMAL HIGH (ref 70–99)

## 2022-03-06 LAB — RESPIRATORY PANEL BY PCR

## 2022-03-06 LAB — CBC WITH DIFFERENTIAL/PLATELET
Abs Immature Granulocytes: 0.04 10*3/uL (ref 0.00–0.07)
Basophils Absolute: 0 10*3/uL (ref 0.0–0.1)
Basophils Relative: 0 %
Eosinophils Absolute: 0 10*3/uL (ref 0.0–0.5)
Eosinophils Relative: 0 %
HCT: 42.1 % (ref 39.0–52.0)
Hemoglobin: 14 g/dL (ref 13.0–17.0)
Immature Granulocytes: 0 %
Lymphocytes Relative: 7 %
Lymphs Abs: 0.7 10*3/uL (ref 0.7–4.0)
MCH: 31.5 pg (ref 26.0–34.0)
MCHC: 33.3 g/dL (ref 30.0–36.0)
MCV: 94.6 fL (ref 80.0–100.0)
Monocytes Absolute: 1.7 10*3/uL — ABNORMAL HIGH (ref 0.1–1.0)
Monocytes Relative: 15 %
Neutro Abs: 8.5 10*3/uL — ABNORMAL HIGH (ref 1.7–7.7)
Neutrophils Relative %: 78 %
Platelets: 202 10*3/uL (ref 150–400)
RBC: 4.45 MIL/uL (ref 4.22–5.81)
RDW: 12.4 % (ref 11.5–15.5)
WBC: 11 10*3/uL — ABNORMAL HIGH (ref 4.0–10.5)
nRBC: 0 % (ref 0.0–0.2)

## 2022-03-06 LAB — URINALYSIS, ROUTINE W REFLEX MICROSCOPIC
Bacteria, UA: NONE SEEN
Bilirubin Urine: NEGATIVE
Glucose, UA: NEGATIVE mg/dL
Hgb urine dipstick: NEGATIVE
Ketones, ur: 5 mg/dL — AB
Leukocytes,Ua: NEGATIVE
Nitrite: NEGATIVE
Protein, ur: 30 mg/dL — AB
Specific Gravity, Urine: 1.018 (ref 1.005–1.030)
pH: 5 (ref 5.0–8.0)

## 2022-03-06 LAB — PROTIME-INR
INR: 1.1 (ref 0.8–1.2)
Prothrombin Time: 14.2 seconds (ref 11.4–15.2)

## 2022-03-06 LAB — LACTIC ACID, PLASMA: Lactic Acid, Venous: 1.4 mmol/L (ref 0.5–1.9)

## 2022-03-06 LAB — RESP PANEL BY RT-PCR (FLU A&B, COVID) ARPGX2
Influenza A by PCR: NEGATIVE
Influenza B by PCR: NEGATIVE
SARS Coronavirus 2 by RT PCR: NEGATIVE

## 2022-03-06 MED ORDER — ALBUTEROL SULFATE (2.5 MG/3ML) 0.083% IN NEBU
2.5000 mg | INHALATION_SOLUTION | RESPIRATORY_TRACT | Status: DC | PRN
  Administered 2022-03-07 – 2022-03-10 (×2): 2.5 mg via RESPIRATORY_TRACT
  Filled 2022-03-06 (×2): qty 3

## 2022-03-06 MED ORDER — ASPIRIN 81 MG PO TBEC
81.0000 mg | DELAYED_RELEASE_TABLET | Freq: Every day | ORAL | Status: DC
  Administered 2022-03-07 – 2022-03-11 (×5): 81 mg via ORAL
  Filled 2022-03-06 (×5): qty 1

## 2022-03-06 MED ORDER — SODIUM CHLORIDE 0.9 % IV SOLN
500.0000 mg | INTRAVENOUS | Status: DC
  Administered 2022-03-06: 500 mg via INTRAVENOUS
  Filled 2022-03-06: qty 5

## 2022-03-06 MED ORDER — PANTOPRAZOLE SODIUM 40 MG PO TBEC
40.0000 mg | DELAYED_RELEASE_TABLET | Freq: Every day | ORAL | Status: DC
  Administered 2022-03-06 – 2022-03-10 (×5): 40 mg via ORAL
  Filled 2022-03-06 (×5): qty 1

## 2022-03-06 MED ORDER — LISINOPRIL 5 MG PO TABS
5.0000 mg | ORAL_TABLET | Freq: Every day | ORAL | Status: DC
  Administered 2022-03-07 – 2022-03-11 (×5): 5 mg via ORAL
  Filled 2022-03-06 (×5): qty 1

## 2022-03-06 MED ORDER — FLUTICASONE PROPIONATE 50 MCG/ACT NA SUSP
2.0000 | Freq: Two times a day (BID) | NASAL | Status: DC
  Administered 2022-03-06 – 2022-03-11 (×10): 2 via NASAL
  Filled 2022-03-06: qty 16

## 2022-03-06 MED ORDER — ONDANSETRON HCL 4 MG/2ML IJ SOLN: 4.0000 mg | Freq: Four times a day (QID) | INTRAMUSCULAR | Status: DC | PRN

## 2022-03-06 MED ORDER — ROSUVASTATIN CALCIUM 5 MG PO TABS
10.0000 mg | ORAL_TABLET | Freq: Every day | ORAL | Status: DC
  Administered 2022-03-06 – 2022-03-10 (×5): 10 mg via ORAL
  Filled 2022-03-06 (×5): qty 2

## 2022-03-06 MED ORDER — ACETAMINOPHEN 500 MG PO TABS
1000.0000 mg | ORAL_TABLET | Freq: Once | ORAL | Status: AC
  Administered 2022-03-06: 1000 mg via ORAL
  Filled 2022-03-06: qty 2

## 2022-03-06 MED ORDER — MORPHINE SULFATE (PF) 2 MG/ML IV SOLN: 2.0000 mg | INTRAVENOUS | Status: DC | PRN

## 2022-03-06 MED ORDER — ACETAMINOPHEN 650 MG RE SUPP: 650.0000 mg | Freq: Four times a day (QID) | RECTAL | Status: DC | PRN

## 2022-03-06 MED ORDER — GUAIFENESIN ER 600 MG PO TB12
600.0000 mg | ORAL_TABLET | Freq: Two times a day (BID) | ORAL | Status: DC | PRN
  Administered 2022-03-06: 600 mg via ORAL
  Filled 2022-03-06: qty 1

## 2022-03-06 MED ORDER — BISACODYL 5 MG PO TBEC: 5.0000 mg | DELAYED_RELEASE_TABLET | Freq: Every day | ORAL | Status: DC | PRN

## 2022-03-06 MED ORDER — SODIUM CHLORIDE 0.9 % IV BOLUS
1000.0000 mL | Freq: Once | INTRAVENOUS | Status: AC
  Administered 2022-03-06: 1000 mL via INTRAVENOUS

## 2022-03-06 MED ORDER — SODIUM CHLORIDE 0.9% FLUSH
3.0000 mL | Freq: Two times a day (BID) | INTRAVENOUS | Status: DC
  Administered 2022-03-06 – 2022-03-11 (×8): 3 mL via INTRAVENOUS

## 2022-03-06 MED ORDER — GABAPENTIN 300 MG PO CAPS
300.0000 mg | ORAL_CAPSULE | Freq: Every day | ORAL | Status: DC
  Administered 2022-03-06 – 2022-03-10 (×5): 300 mg via ORAL
  Filled 2022-03-06 (×5): qty 1

## 2022-03-06 MED ORDER — SODIUM CHLORIDE 0.9 % IV SOLN
INTRAVENOUS | Status: DC
  Administered 2022-03-08: 50 mL/h via INTRAVENOUS

## 2022-03-06 MED ORDER — HYDRALAZINE HCL 20 MG/ML IJ SOLN: 5.0000 mg | INTRAMUSCULAR | Status: DC | PRN

## 2022-03-06 MED ORDER — ENOXAPARIN SODIUM 40 MG/0.4ML IJ SOSY
40.0000 mg | PREFILLED_SYRINGE | INTRAMUSCULAR | Status: DC
  Administered 2022-03-07 – 2022-03-10 (×4): 40 mg via SUBCUTANEOUS
  Filled 2022-03-06 (×4): qty 0.4

## 2022-03-06 MED ORDER — SODIUM CHLORIDE 0.9 % IV SOLN
2.0000 g | INTRAVENOUS | Status: DC
  Administered 2022-03-06: 2 g via INTRAVENOUS
  Filled 2022-03-06: qty 20

## 2022-03-06 MED ORDER — ACETAMINOPHEN 325 MG PO TABS
650.0000 mg | ORAL_TABLET | Freq: Four times a day (QID) | ORAL | Status: DC | PRN
  Administered 2022-03-09: 650 mg via ORAL
  Filled 2022-03-06: qty 2

## 2022-03-06 MED ORDER — LACTATED RINGERS IV SOLN: INTRAVENOUS | Status: DC

## 2022-03-06 MED ORDER — NAPHAZOLINE-GLYCERIN 0.012-0.25 % OP SOLN
2.0000 [drp] | Freq: Four times a day (QID) | OPHTHALMIC | Status: DC | PRN
  Filled 2022-03-06: qty 15

## 2022-03-06 MED ORDER — INSULIN GLARGINE-YFGN 100 UNIT/ML ~~LOC~~ SOLN
15.0000 [IU] | Freq: Every day | SUBCUTANEOUS | Status: DC
  Administered 2022-03-06 – 2022-03-10 (×5): 15 [IU] via SUBCUTANEOUS
  Filled 2022-03-06 (×7): qty 0.15

## 2022-03-06 MED ORDER — DOCUSATE SODIUM 100 MG PO CAPS
100.0000 mg | ORAL_CAPSULE | Freq: Two times a day (BID) | ORAL | Status: DC
  Administered 2022-03-06 – 2022-03-11 (×10): 100 mg via ORAL
  Filled 2022-03-06 (×10): qty 1

## 2022-03-06 MED ORDER — OXYCODONE HCL 5 MG PO TABS: 5.0000 mg | ORAL_TABLET | ORAL | Status: DC | PRN

## 2022-03-06 MED ORDER — ONDANSETRON HCL 4 MG PO TABS: 4.0000 mg | ORAL_TABLET | Freq: Four times a day (QID) | ORAL | Status: DC | PRN

## 2022-03-06 MED ORDER — INSULIN ASPART 100 UNIT/ML IJ SOLN
0.0000 [IU] | Freq: Three times a day (TID) | INTRAMUSCULAR | Status: DC
  Administered 2022-03-07 (×2): 2 [IU] via SUBCUTANEOUS
  Administered 2022-03-07: 1 [IU] via SUBCUTANEOUS
  Administered 2022-03-08 – 2022-03-09 (×6): 2 [IU] via SUBCUTANEOUS
  Administered 2022-03-10: 5 [IU] via SUBCUTANEOUS
  Administered 2022-03-10 – 2022-03-11 (×3): 1 [IU] via SUBCUTANEOUS

## 2022-03-06 MED ORDER — POLYETHYLENE GLYCOL 3350 17 G PO PACK: 17.0000 g | PACK | Freq: Every day | ORAL | Status: DC | PRN

## 2022-03-06 NOTE — H&P (Signed)
History and Physical    Patient: Jackson [name] XYB:[id number] DOB: 1928/11/22 DOA: 03/06/2022 DOS: the patient was seen and examined on 03/06/2022 PCP: Josetta Huddle, MD  Patient coming from: Home - lives with wife; NOK: Wife, Sakari Alkhatib, [phone]   Chief Complaint: Fever  HPI: Jackson [name] is a 88 y.o. male with medical history significant of DM; HTN; HLD; and chronic diastolic CHF presenting with fever. He reports that he has PNA, has had it before and it's "hard to shake."  He saw Dr. Inda Merlin Tuesday and "he sounded like I needed" a prescription, but he didn't give him one.  He reports a "light-grade" fever, occasional cough which is productive of "foam", some hoarseness.  Denies sick contacts - but his DIL is upstairs now with a COPD exacerbation.    ER Course:  Needs admission for sepsis.  3 days of cough, fever.  Mild DOE, no O2 at home.  Has h/o PNA.  CXR negative.  Mild leukocytosis.  Diffuse coarse breath sounds.  O2 92%.  Given Tylenol, IVF.  COVID/flu pending.    Review of Systems: As mentioned in the history of present illness. All other systems reviewed and are negative. Past Medical History:  Diagnosis Date   Aortic stenosis    Echo 3/18: mild aortic stenosis (mean 12/peak 22 // Echo 3/19: mild AS (mean 12, peak 20) // Echo 02/2019: EF 77-41, grade 1 diastolic dysfunction, mild MAC, mild aortic stenosis (mean gradient 9), mild dilation of the ascending aorta (38 mm)    Atherosclerosis of aorta ([place]) 12/16/2016   CXR 6/16: IMPRESSION: 1. Resolved left basilar airspace opacities. 2. Hazy nodularity in both lungs compatible with the patient's known pleural plaques. 3. Right anterior hemidiaphragmatic eventration. 4. Thoracic spondylosis. 5. Atherosclerotic calcification of the aortic arch.   Bradycardia    Low HR (30s) noted during sleep during admx for sepsis in 2016; [place] 1, 2:1 block >> no indication for pacer; management of OSA recommended // transient CHB noted on  ECG during admit in 10/2018    Diabetes mellitus without complication (HCC)    Diabetic neuropathy (HCC)    DJD (degenerative joint disease)    knee, hip   Echocardiogram    Echo 6/16: mod LVH, EF 65-70, vigorous LVF, no RWMA, mild MR, mild LAE // Echo 3/18: EF 60-65, Gr 1 DD, mild AS, PASP 47 // Echo 3/19: EF 60-65, Gr 1 DD, mild AS (mean 12) // Echo 7/19: EF 55-60, Gr 1 DD, PASP 36   Elevated troponin 06/28/2020   GERD (gastroesophageal reflux disease)    Heart murmur    HLD (hyperlipidemia)    Hypertension    Hyperthyroidism    Lexiscan Myoview 06/2018   Normal perfusion, EF 81; Low Risk   Mild diastolic dysfunction 42/39/5320   Noted on Echocardiogram; Patient does not require diuretic Rx   Phlebitis of left leg    Pneumonia 02/2015   admx with sepsis    Past Surgical History:  Procedure Laterality Date   CATARACT EXTRACTION Right    CIRCUMCISION     FRACTURE SURGERY     TOTAL HIP ARTHROPLASTY Left 09/24/2017   Procedure: LEFT TOTAL HIP ARTHROPLASTY ANTERIOR APPROACH;  Surgeon: Mcarthur Rossetti, MD;  Location: [facility];  Service: Orthopedics;  Laterality: Left;   Social History:  reports that he quit smoking about 63 years ago. He has never used smokeless tobacco. He reports that he does not drink alcohol and does not use drugs.  Allergies  Allergen Reactions   Actos [Pioglitazone] Swelling   Formaldehyde Other (See Comments)    Flu-like symptoms   Lipitor [Atorvastatin Calcium] Other (See Comments)    unknown   Metformin And Related Other (See Comments)    unknown    Family History  Problem Relation Age of Onset   Diabetes Mother    Diabetes Sister    Diabetes Brother    Heart attack Neg Hx    Stroke Neg Hx    Hypertension Neg Hx     Prior to Admission medications   Medication Sig Start Date End Date Taking? Authorizing Provider  albuterol (PROVENTIL) (2.5 MG/3ML) 0.083% nebulizer solution Take 2.5 mg by nebulization every 6 (six) hours as needed for  wheezing or shortness of breath.    [provider]  aspirin EC 81 MG tablet Take 81 mg by mouth daily.    [provider]  Calcium Carbonate-Vitamin D (CALCIUM 600+D PO) Take 1 tablet by mouth daily.    [provider]  docusate sodium (COLACE) 100 MG capsule Take 100 mg by mouth at bedtime.    [provider]  fluticasone (FLONASE) 50 MCG/ACT nasal spray Place 2 sprays into both nostrils 2 (two) times daily. 11/20/18   Arrien, Jimmy Picket, MD  gabapentin (NEURONTIN) 100 MG capsule Take 300 mg by mouth at bedtime.     [provider]  insulin aspart (NOVOLOG FLEXPEN) 100 UNIT/ML FlexPen Before each meal 3 times a day, 140-199 - 2 units, 200-250 - 4 units, 251-299 - 6 units,  300-349 - 8 units,  350 or above 10 units. Patient taking differently: Inject 2-10 Units into the skin See admin instructions. Per sliding scale before each meal 3 times a day, 140-199 - 2 units, 200-250 - 4 units, 251-299 - 6 units,  300-349 - 8 units,  350 or above 10 units. 02/28/15   Florencia Reasons, MD  Insulin Glargine (LANTUS) 100 UNIT/ML Solostar Pen Inject 15 Units into the skin daily at 10 pm.    [provider]  lisinopril (PRINIVIL,ZESTRIL) 5 MG tablet Take 1 tablet (5 mg total) by mouth daily. 12/26/18   Patwardhan, Reynold Bowen, MD  omeprazole (PRILOSEC) 20 MG capsule Take 20 mg by mouth daily.  10/20/16   [provider]  rosuvastatin (CRESTOR) 10 MG tablet TAKE 1 TABLET AT BEDTIME Patient taking differently: Take 10 mg by mouth at bedtime.  03/26/20   Adrian Prows, MD  sodium chloride (OCEAN) 0.65 % SOLN nasal spray Place 1 spray into both nostrils as needed for congestion. 11/20/18   Arrien, Jimmy Picket, MD  tetrahydrozoline-zinc (VISINE-AC) 0.05-0.25 % ophthalmic solution Place 2 drops into both eyes 3 (three) times daily as needed (dry eyes).    [provider]    Physical Exam: Vitals:   03/06/22 1330 03/06/22 1430 03/06/22 1500 03/06/22 1516  BP:  (!) 154/69 (!) 128/50 119/61   Pulse: 86 74 63   Resp: 20 (!) 27 16   Temp:    98.4 F (36.9 C)  TempSrc:    Oral  SpO2: 92% 92% 95%   Weight:      Height:       General:  Appears calm and comfortable and is in NAD, on RA Eyes:  PERRL, EOMI, normal lids, iris ENT:  grossly normal hearing, lips & tongue, mmm; appropriate dentition Neck:  no LAD, masses or thyromegaly Cardiovascular:  RRR, no m/r/g. No LE edema.  Respiratory:   CTA bilaterally with no wheezes/rales/rhonchi.  Normal to mildly increased respiratory effort. Abdomen:  soft, NT, ND Skin:  no rash or induration seen on limited exam Musculoskeletal:  grossly normal tone BUE/BLE, good ROM, no bony abnormality Psychiatric:  grossly normal mood and affect, speech fluent and appropriate, AOx3 Neurologic:  CN 2-12 grossly intact, moves all extremities in coordinated fashion   Radiological Exams on Admission: Independently reviewed - see discussion in A/P where applicable  DG Chest 2 View  Result Date: 03/06/2022 CLINICAL DATA:  Suspected Sepsis EXAM: CHEST - 2 VIEW COMPARISON:  November 18, 2021 FINDINGS: The heart size and mediastinal contours are stable. Low lung volumes. Again seen is the eventration of the anterior right hemidiaphragm. Mild atelectasis at the bibasilar regions greater on the left. The visualized skeletal structures are unremarkable. IMPRESSION: Low lung volumes.  Bibasilar atelectasis greater on the left. Electronically Signed   By: Frazier Richards M.D.   On: 03/06/2022 12:40    EKG: pending   Labs on Admission: I have personally reviewed the available labs and imaging studies at the time of the admission.  Pertinent labs:    Glucose 193 BUN 13/Creatinine 1.28/GFR 53 - stable from 2021 Lactate 1.4 WBC 11 INR 1.1 UA: 5 ketones, 30 protein Blood culture pending   Assessment and Plan: Principal Problem:   Fever Active Problems:   Type 2 diabetes mellitus with hyperglycemia (HCC)   Essential  hypertension   Dyslipidemia   Chronic kidney disease, stage 3a (HCC)   DNR (do not resuscitate)    Fever -Patient presenting with fever, cough -He does have SIRS criteria (fever, tachypnea, leukocytosis) but no evidence of organ dysfunction currently -Blood cultures are pending -Currently low suspicion for sepsis -CXR is negative for PNA; given the duration of symptoms, it appears likely that this is a viral syndrome rather than CAP -Will observe on telemetry - given his age alone, he is at high risk for decompensation -Will check RVP -If still without hypoxia or other obvious issues tomorrow, he may be appropriate for dc  DM -Will check A1c -Continue Lantus -Cover with sensitive-scale SSI  -Continue neurontin  HTN -Continue lisinopril -Continue ASA  HLD -Continue Crestor  Stage 3a CKD -Appears to be stable based on 2021 data -Will recheck in AM  DNR -I have discussed code status with the patient and he would not desire resuscitation and would prefer to die a natural death should that situation arise. -He will need a gold out of facility DNR form at the time of discharge     Advance Care Planning:   Code Status: DNR   Consults: None  DVT Prophylaxis: Lovenox  Family Communication: None present; he declined to have me call family at the time of admission  Severity of Illness: The appropriate patient status for this patient is OBSERVATION. Observation status is judged to be reasonable and necessary in order to provide the required intensity of service to ensure the patient's safety. The patient's presenting symptoms, physical exam findings, and initial radiographic and laboratory data in the context of their medical condition is felt to place them at decreased risk for further clinical deterioration. Furthermore, it is anticipated that the patient will be medically stable for discharge from the hospital within 2 midnights of admission.   Author: Karmen Bongo,  MD 03/06/2022 3:18 PM  For on call review [email or web address].

## 2022-03-06 NOTE — ED Provider Notes (Signed)
[facility] EMERGENCY DEPARTMENT Provider Note   CSN: [id number] Arrival date & time: 03/06/22  1107     History  Chief Complaint  Patient presents with   Fever    Jackson [name] is a 88 y.o. male who presents the emergency department with 3 days of fever, weakness, and productive cough.  Patient states that wife checked his temperature at home and reports he only had a "little fever".  He has a history of pneumonia, and believes this is come back.  No chest pain.   Fever Associated symptoms: cough   Associated symptoms: no chest pain        Home Medications Prior to Admission medications   Medication Sig Start Date End Date Taking? Authorizing Provider  albuterol (PROVENTIL) (2.5 MG/3ML) 0.083% nebulizer solution Take 2.5 mg by nebulization every 6 (six) hours as needed for wheezing or shortness of breath.    [provider]  aspirin EC 81 MG tablet Take 81 mg by mouth daily.    [provider]  Calcium Carbonate-Vitamin D (CALCIUM 600+D PO) Take 1 tablet by mouth daily.    [provider]  docusate sodium (COLACE) 100 MG capsule Take 100 mg by mouth at bedtime.    [provider]  fluticasone (FLONASE) 50 MCG/ACT nasal spray Place 2 sprays into both nostrils 2 (two) times daily. 11/20/18   Arrien, York Ram, MD  gabapentin (NEURONTIN) 100 MG capsule Take 300 mg by mouth at bedtime.     [provider]  insulin aspart (NOVOLOG FLEXPEN) 100 UNIT/ML FlexPen Before each meal 3 times a day, 140-199 - 2 units, 200-250 - 4 units, 251-299 - 6 units,  300-349 - 8 units,  350 or above 10 units. Patient taking differently: Inject 2-10 Units into the skin See admin instructions. Per sliding scale before each meal 3 times a day, 140-199 - 2 units, 200-250 - 4 units, 251-299 - 6 units,  300-349 - 8 units,  350 or above 10 units. 02/28/15   Albertine Grates, MD  Insulin Glargine (LANTUS) 100 UNIT/ML Solostar Pen Inject 15 Units into the  skin daily at 10 pm.    [provider]  lisinopril (PRINIVIL,ZESTRIL) 5 MG tablet Take 1 tablet (5 mg total) by mouth daily. 12/26/18   Patwardhan, Anabel Bene, MD  omeprazole (PRILOSEC) 20 MG capsule Take 20 mg by mouth daily.  10/20/16   [provider]  rosuvastatin (CRESTOR) 10 MG tablet TAKE 1 TABLET AT BEDTIME Patient taking differently: Take 10 mg by mouth at bedtime.  03/26/20   Yates Decamp, MD  sodium chloride (OCEAN) 0.65 % SOLN nasal spray Place 1 spray into both nostrils as needed for congestion. 11/20/18   Arrien, York Ram, MD  tetrahydrozoline-zinc (VISINE-AC) 0.05-0.25 % ophthalmic solution Place 2 drops into both eyes 3 (three) times daily as needed (dry eyes).    [provider]      Allergies    Actos [pioglitazone], Formaldehyde, Lipitor [atorvastatin calcium], and Metformin and related    Review of Systems   Review of Systems  Constitutional:  Positive for fever.  Respiratory:  Positive for cough and shortness of breath.   Cardiovascular:  Negative for chest pain.  All other systems reviewed and are negative.   Physical Exam Updated Vital Signs BP (!) 154/69   Pulse 86   Temp (!) 101.9 F (38.8 C) (Rectal)   Resp 20   Ht 5\' 7"  (1.702 m)   Wt 77.1 kg  SpO2 92%   BMI 26.63 kg/m  Physical Exam Vitals and nursing note reviewed.  Constitutional:      Appearance: Normal appearance.  HENT:     Head: Normocephalic and atraumatic.  Eyes:     Conjunctiva/sclera: Conjunctivae normal.  Cardiovascular:     Rate and Rhythm: Normal rate and regular rhythm.  Pulmonary:     Effort: Pulmonary effort is normal. Tachypnea present. No respiratory distress.     Breath sounds: Rhonchi present.     Comments: Diffuse coarse lung sounds in all fields to auscultation Abdominal:     General: There is no distension.     Palpations: Abdomen is soft.     Tenderness: There is no abdominal tenderness.  Skin:    General: Skin is warm and dry.   Neurological:     General: No focal deficit present.     Mental Status: He is alert.     ED Results / Procedures / Treatments   Labs (all labs ordered are listed, but only abnormal results are displayed) Labs Reviewed  COMPREHENSIVE METABOLIC PANEL - Abnormal; Notable for the following components:      Result Value   Sodium 133 (*)    Chloride 95 (*)    Glucose, Bld 193 (*)    Creatinine, Ser 1.28 (*)    GFR, Estimated 53 (*)    All other components within normal limits  CBC WITH DIFFERENTIAL/PLATELET - Abnormal; Notable for the following components:   WBC 11.0 (*)    Neutro Abs 8.5 (*)    Monocytes Absolute 1.7 (*)    All other components within normal limits  URINALYSIS, ROUTINE W REFLEX MICROSCOPIC - Abnormal; Notable for the following components:   Ketones, ur 5 (*)    Protein, ur 30 (*)    All other components within normal limits  CULTURE, BLOOD (ROUTINE X 2)  CULTURE, BLOOD (ROUTINE X 2)  RESP PANEL BY RT-PCR (FLU A&B, COVID) ARPGX2  LACTIC ACID, PLASMA  PROTIME-INR  LACTIC ACID, PLASMA    EKG None  Radiology DG Chest 2 View  Result Date: 03/06/2022 CLINICAL DATA:  Suspected Sepsis EXAM: CHEST - 2 VIEW COMPARISON:  November 18, 2021 FINDINGS: The heart size and mediastinal contours are stable. Low lung volumes. Again seen is the eventration of the anterior right hemidiaphragm. Mild atelectasis at the bibasilar regions greater on the left. The visualized skeletal structures are unremarkable. IMPRESSION: Low lung volumes.  Bibasilar atelectasis greater on the left. Electronically Signed   By: Marjo Bicker M.D.   On: 03/06/2022 12:40    Procedures .Critical Care  Performed by: Su Monks, PA-C Authorized by: Su Monks, PA-C   Critical care provider statement:    Critical care time (minutes):  30   Critical care time was exclusive of:  Separately billable procedures and treating other patients   Critical care was necessary to treat or prevent  imminent or life-threatening deterioration of the following conditions:  Sepsis   Critical care was time spent personally by me on the following activities:  Development of treatment plan with patient or surrogate, discussions with consultants, evaluation of patient's response to treatment, examination of patient, ordering and review of laboratory studies, ordering and review of radiographic studies, ordering and performing treatments and interventions, pulse oximetry, re-evaluation of patient's condition and review of old charts     Medications Ordered in ED Medications  lactated ringers infusion ( Intravenous New Bag/Given 03/06/22 1420)  cefTRIAXone (ROCEPHIN) 2 g in sodium  chloride 0.9 % 100 mL IVPB (2 g Intravenous New Bag/Given 03/06/22 1324)  azithromycin (ZITHROMAX) 500 mg in sodium chloride 0.9 % 250 mL IVPB (500 mg Intravenous New Bag/Given 03/06/22 1421)  acetaminophen (TYLENOL) tablet 1,000 mg (1,000 mg Oral Given 03/06/22 1300)  sodium chloride 0.9 % bolus 1,000 mL (1,000 mLs Intravenous New Bag/Given 03/06/22 1301)    ED Course/ Medical Decision Making/ A&P                           Medical Decision Making Amount and/or Complexity of Data Reviewed Labs: ordered. Radiology: ordered.  Risk OTC drugs. Prescription drug management.  This patient is a 88 y.o. male who presents to the ED for concern of cough and fever, this involves an extensive number of treatment options, and is a complaint that carries with it a high risk of complications and morbidity.   Past Medical History / Co-morbidities / Social History: CHF, diastolic dysfunction, diabetes  Physical Exam: Physical exam performed. The pertinent findings include: Febrile to 102.4F, and tachypneic.  Rhonchorous lung sounds in all lung fields, no obvious consolidation.  Lab Tests: I ordered, and personally interpreted labs.  The pertinent results include: Leukocytosis of 11,000.  Sodium 133. Glucose 193.  Creatinine 1.28,  compared to 0.99 a year ago. Lactic acid 1.4    Imaging Studies: I ordered imaging studies including chest x-ray. I independently visualized and interpreted imaging which showed low lung volumes, no obvious consolidation. I agree with the radiologist interpretation.   Medications: I ordered medication including IV fluids, Tylenol, empiric antibiotics for possible pneumonia. I have reviewed the patients home medicines and have made adjustments as needed.  Consultations Obtained: I requested consultation with the hospitalist Dr Ophelia Charter,  and discussed lab and imaging findings as well as pertinent plan - they recommend: medical admission   Disposition: After consideration of the diagnostic results and the patients response to treatment, I feel that patient is requiring medical admission for sepsis due to pneumonia.   I discussed this case with my attending physician Dr. Effie Shy who cosigned this note including patient's presenting symptoms, physical exam, and planned diagnostics and interventions. Attending physician stated agreement with plan or made changes to plan which were implemented.     Final Clinical Impression(s) / ED Diagnoses Final diagnoses:  Sepsis due to pneumonia [facility])    Rx / DC Orders ED Discharge Orders     None      Portions of this report may have been transcribed using voice recognition software. Every effort was made to ensure accuracy; however, inadvertent computerized transcription errors may be present.    Jeanella Flattery 03/06/22 1431    Mancel Bale, MD 03/07/22 0710

## 2022-03-06 NOTE — ED Provider Notes (Signed)
  Face-to-face evaluation   History: Chronically ill patient with history of diastolic congestive heart failure, sepsis, pneumonia, respiratory failure with hypoxia and diabetes presenting for evaluation of fever and weakness  Physical exam: Alert alert and cooperative.  No dysarthria or aphasia.  No respiratory distress.  MDM: Evaluation for  Chief Complaint  Patient presents with   Fever     Patient presenting with chills fever cough and sputum production.  Initial evaluation is consistent with community-acquired pneumonia.  No respiratory distress.  He does not require past airway.  Doubt severe sepsis  Medical screening examination/treatment/procedure(s) were conducted as a shared visit with non-physician practitioner(s) and myself.  I personally evaluated the patient during the encounter    Mancel Bale, MD 03/07/22 [phone]

## 2022-03-06 NOTE — ED Triage Notes (Signed)
Pt BIBA from home c/o a fever and weakness x 2 days. Hx of PNA.   VS: BP:148/60 RR-16 P: 90 RA-89% 2L 99% CBG-230

## 2022-03-06 NOTE — Progress Notes (Signed)
Elink following for sepsis protocol. 

## 2022-03-06 NOTE — Plan of Care (Signed)
  Problem: Clinical Measurements: Goal: Respiratory complications will improve Outcome: Progressing   Problem: Coping: Goal: Level of anxiety will decrease Outcome: Progressing   

## 2022-03-07 ENCOUNTER — Observation Stay (HOSPITAL_COMMUNITY): Payer: Medicare (Managed Care)

## 2022-03-07 DIAGNOSIS — I110 Hypertensive heart disease with heart failure: Secondary | ICD-10-CM | POA: Diagnosis present

## 2022-03-07 DIAGNOSIS — R0602 Shortness of breath: Secondary | ICD-10-CM | POA: Diagnosis not present

## 2022-03-07 DIAGNOSIS — Z9841 Cataract extraction status, right eye: Secondary | ICD-10-CM | POA: Diagnosis not present

## 2022-03-07 DIAGNOSIS — Z96642 Presence of left artificial hip joint: Secondary | ICD-10-CM | POA: Diagnosis present

## 2022-03-07 DIAGNOSIS — I441 Atrioventricular block, second degree: Secondary | ICD-10-CM | POA: Diagnosis present

## 2022-03-07 DIAGNOSIS — Z87891 Personal history of nicotine dependence: Secondary | ICD-10-CM | POA: Diagnosis not present

## 2022-03-07 DIAGNOSIS — Z20822 Contact with and (suspected) exposure to covid-19: Secondary | ICD-10-CM | POA: Diagnosis present

## 2022-03-07 DIAGNOSIS — R509 Fever, unspecified: Secondary | ICD-10-CM | POA: Diagnosis present

## 2022-03-07 DIAGNOSIS — Z888 Allergy status to other drugs, medicaments and biological substances status: Secondary | ICD-10-CM | POA: Diagnosis not present

## 2022-03-07 DIAGNOSIS — J189 Pneumonia, unspecified organism: Secondary | ICD-10-CM | POA: Diagnosis present

## 2022-03-07 DIAGNOSIS — I5032 Chronic diastolic (congestive) heart failure: Secondary | ICD-10-CM | POA: Diagnosis present

## 2022-03-07 DIAGNOSIS — R7989 Other specified abnormal findings of blood chemistry: Secondary | ICD-10-CM | POA: Diagnosis present

## 2022-03-07 DIAGNOSIS — J440 Chronic obstructive pulmonary disease with acute lower respiratory infection: Secondary | ICD-10-CM | POA: Diagnosis present

## 2022-03-07 DIAGNOSIS — E785 Hyperlipidemia, unspecified: Secondary | ICD-10-CM | POA: Diagnosis present

## 2022-03-07 DIAGNOSIS — E871 Hypo-osmolality and hyponatremia: Secondary | ICD-10-CM

## 2022-03-07 DIAGNOSIS — Z833 Family history of diabetes mellitus: Secondary | ICD-10-CM | POA: Diagnosis not present

## 2022-03-07 DIAGNOSIS — J9601 Acute respiratory failure with hypoxia: Secondary | ICD-10-CM | POA: Diagnosis present

## 2022-03-07 DIAGNOSIS — R001 Bradycardia, unspecified: Secondary | ICD-10-CM | POA: Diagnosis not present

## 2022-03-07 DIAGNOSIS — E1140 Type 2 diabetes mellitus with diabetic neuropathy, unspecified: Secondary | ICD-10-CM | POA: Diagnosis present

## 2022-03-07 DIAGNOSIS — Z7982 Long term (current) use of aspirin: Secondary | ICD-10-CM | POA: Diagnosis not present

## 2022-03-07 DIAGNOSIS — I442 Atrioventricular block, complete: Secondary | ICD-10-CM | POA: Diagnosis not present

## 2022-03-07 DIAGNOSIS — B348 Other viral infections of unspecified site: Secondary | ICD-10-CM

## 2022-03-07 DIAGNOSIS — A419 Sepsis, unspecified organism: Secondary | ICD-10-CM | POA: Diagnosis present

## 2022-03-07 DIAGNOSIS — B9789 Other viral agents as the cause of diseases classified elsewhere: Secondary | ICD-10-CM | POA: Diagnosis present

## 2022-03-07 DIAGNOSIS — K219 Gastro-esophageal reflux disease without esophagitis: Secondary | ICD-10-CM | POA: Diagnosis present

## 2022-03-07 DIAGNOSIS — B9710 Unspecified enterovirus as the cause of diseases classified elsewhere: Secondary | ICD-10-CM | POA: Diagnosis present

## 2022-03-07 DIAGNOSIS — Z66 Do not resuscitate: Secondary | ICD-10-CM | POA: Diagnosis present

## 2022-03-07 LAB — BASIC METABOLIC PANEL
Anion gap: 9 (ref 5–15)
BUN: 13 mg/dL (ref 8–23)
CO2: 23 mmol/L (ref 22–32)
Calcium: 8.6 mg/dL — ABNORMAL LOW (ref 8.9–10.3)
Chloride: 96 mmol/L — ABNORMAL LOW (ref 98–111)
Creatinine, Ser: 1.13 mg/dL (ref 0.61–1.24)
GFR, Estimated: >60 mL/min (ref >60)
Glucose, Bld: 188 mg/dL — ABNORMAL HIGH (ref 70–99)
Potassium: 3.9 mmol/L (ref 3.5–5.1)
Sodium: 128 mmol/L — ABNORMAL LOW (ref 135–145)

## 2022-03-07 LAB — CBC
HCT: 36.3 % — ABNORMAL LOW (ref 39.0–52.0)
Hemoglobin: 12.1 g/dL — ABNORMAL LOW (ref 13.0–17.0)
MCH: 31.3 pg (ref 26.0–34.0)
MCHC: 33.3 g/dL (ref 30.0–36.0)
MCV: 94 fL (ref 80.0–100.0)
Platelets: 153 10*3/uL (ref 150–400)
RBC: 3.86 MIL/uL — ABNORMAL LOW (ref 4.22–5.81)
RDW: 12.5 % (ref 11.5–15.5)
WBC: 10.6 10*3/uL — ABNORMAL HIGH (ref 4.0–10.5)
nRBC: 0 % (ref 0.0–0.2)

## 2022-03-07 LAB — SODIUM, URINE, RANDOM: Sodium, Ur: 62 mmol/L

## 2022-03-07 LAB — GLUCOSE, CAPILLARY
Glucose-Capillary: 184 mg/dL — ABNORMAL HIGH (ref 70–99)
Glucose-Capillary: 197 mg/dL — ABNORMAL HIGH (ref 70–99)
Glucose-Capillary: 138 mg/dL — ABNORMAL HIGH (ref 70–99)
Glucose-Capillary: 199 mg/dL — ABNORMAL HIGH (ref 70–99)

## 2022-03-07 LAB — MRSA NEXT GEN BY PCR, NASAL: MRSA by PCR Next Gen: NOT DETECTED

## 2022-03-07 LAB — EXPECTORATED SPUTUM ASSESSMENT W GRAM STAIN, RFLX TO RESP C

## 2022-03-07 LAB — OSMOLALITY, URINE: Osmolality, Ur: 572 mOsm/kg (ref 300–900)

## 2022-03-07 LAB — OSMOLALITY: Osmolality: 280 mOsm/kg (ref 275–295)

## 2022-03-07 LAB — STREP PNEUMONIAE URINARY ANTIGEN: Strep Pneumo Urinary Antigen: NEGATIVE

## 2022-03-07 MED ORDER — ALBUTEROL SULFATE (2.5 MG/3ML) 0.083% IN NEBU
2.5000 mg | INHALATION_SOLUTION | Freq: Three times a day (TID) | RESPIRATORY_TRACT | Status: DC
Start: 2022-03-08 — End: 2022-03-07

## 2022-03-07 MED ORDER — AZITHROMYCIN 250 MG PO TABS
500.0000 mg | ORAL_TABLET | Freq: Every day | ORAL | Status: AC
Start: 2022-03-07 — End: 2022-03-11
  Administered 2022-03-07 – 2022-03-11 (×5): 500 mg via ORAL
  Filled 2022-03-07 (×5): qty 2

## 2022-03-07 MED ORDER — LEVALBUTEROL HCL 0.63 MG/3ML IN NEBU
0.6300 mg | INHALATION_SOLUTION | Freq: Three times a day (TID) | RESPIRATORY_TRACT | Status: DC
  Administered 2022-03-08 – 2022-03-10 (×7): 0.63 mg via RESPIRATORY_TRACT
  Filled 2022-03-07 (×9): qty 3

## 2022-03-07 MED ORDER — SODIUM CHLORIDE 0.9 % IV SOLN
1.0000 g | INTRAVENOUS | Status: AC
  Administered 2022-03-07 – 2022-03-11 (×5): 1 g via INTRAVENOUS
  Filled 2022-03-07 (×5): qty 10

## 2022-03-07 MED ORDER — VANCOMYCIN HCL 1750 MG/350ML IV SOLN
1750.0000 mg | Freq: Once | INTRAVENOUS | Status: AC
  Administered 2022-03-07: 1750 mg via INTRAVENOUS
  Filled 2022-03-07: qty 350

## 2022-03-07 MED ORDER — VANCOMYCIN HCL IN DEXTROSE 1-5 GM/200ML-% IV SOLN
1000.0000 mg | INTRAVENOUS | Status: DC
Start: 2022-03-08 — End: 2022-03-07

## 2022-03-07 NOTE — Assessment & Plan Note (Addendum)
Improved Unclear baseline

## 2022-03-07 NOTE — Progress Notes (Signed)
PROGRESS NOTE    [name]  BLT:[id number] DOB: [birth year] DOA: 03/06/2022 PCP: Marden Noble, MD  Chief Complaint  Patient presents with   Fever    Brief Narrative:  [name] is [name] 88 y.o. male with medical history significant of DM; HTN; HLD; and chronic diastolic CHF presenting with fever. He reports that he has PNA, has had it before and it's "hard to shake."  He saw Dr. Kevan Ny Tuesday and "he sounded like I needed" Maximillian Habibi prescription, but he didn't give him one.  He reports [name] "light-grade" fever, occasional cough which is productive of "foam", some hoarseness.  Denies sick contacts - but his DIL is upstairs now with [name] COPD exacerbation.    Assessment & Plan:   Principal Problem:   Fever Active Problems:   Sepsis due to pneumonia (HCC)   CAP (community acquired pneumonia)   Rhinovirus infection   Respiratory failure, acute (HCC)   Elevated serum creatinine   Hyponatremia   Type 2 diabetes mellitus (HCC)   Essential hypertension   Dyslipidemia   DNR (do not resuscitate)   Type 2 diabetes mellitus with hyperglycemia (HCC)   Assessment and Plan: Sepsis due to pneumonia (HCC) Ruled in, fever, leukocytosis, tachypnea, hypoxia CXR with patchy opacification over mid to lower lungs, atelectasis vs infection Rhinovirus positive, given decompensation with O2 requirement, productive cough -> suspect possibility of bacteria superinfection abx started this AM MRSA pcr, blood cx, sputum cx Urine strep, legionella   Respiratory failure, acute (HCC) Hypoxic this AM per discussion with RN Currently on 4 L Wean as tolerated with treatment of pneumonia  Hyponatremia mild Related to pulm issue? Follow urine Na, urine osm, serum osm Will ofllow on gentle IVF  Elevated serum creatinine Creatinine 1.28 on presentation Unclear recent baseline Improved this morning  Dyslipidemia statin  Essential hypertension lisinopril  Type 2 diabetes mellitus (HCC) Continue home  basal regimen SSI     DVT prophylaxis: lovenox Code Status: dnr Family Communication: none at bedside Disposition:   Status is: Observation The patient will require care spanning > 2 midnights and should be moved to inpatient because: need for IV abx, weaning O2   Consultants:  none  Procedures:  none  Antimicrobials:  Anti-infectives (From admission, onward)    Start     Dose/Rate Route Frequency Ordered Stop   03/08/22 1000  vancomycin (VANCOCIN) IVPB 1000 mg/200 mL premix        1,000 mg 200 mL/hr over 60 Minutes Intravenous Every 24 hours 03/07/22 0903     03/07/22 1000  cefTRIAXone (ROCEPHIN) 1 g in sodium chloride 0.9 % 100 mL IVPB        1 g 200 mL/hr over 30 Minutes Intravenous Every 24 hours 03/07/22 0822     03/07/22 1000  azithromycin (ZITHROMAX) tablet 500 mg        500 mg Oral Daily 03/07/22 0822     03/07/22 1000  vancomycin (VANCOREADY) IVPB 1750 mg/350 mL        1,750 mg 175 mL/hr over 120 Minutes Intravenous  Once 03/07/22 0903     03/06/22 1330  cefTRIAXone (ROCEPHIN) 2 g in sodium chloride 0.9 % 100 mL IVPB  Status:  Discontinued        2 g 200 mL/hr over 30 Minutes Intravenous Every 24 hours 03/06/22 1315 03/06/22 1514   03/06/22 1330  azithromycin (ZITHROMAX) 500 mg in sodium chloride 0.9 % 250 mL IVPB  Status:  Discontinued  500 mg 250 mL/hr over 60 Minutes Intravenous Every 24 hours 03/06/22 1315 03/06/22 1514       Subjective: RN told me patient requiring as much as 9 L to maintain sats (able to wean back to 4 in chair) No new complaints Notes cough, SOB  Objective: Vitals:   03/06/22 2145 03/07/22 0148 03/07/22 0639 03/07/22 0900  BP: (!) 140/47 (!) 130/53 (!) 145/60 (!) 156/53  Pulse: 88 67 73   Resp: 18  18   Temp: 98.9 F (37.2 C)  99 F (37.2 C)   TempSrc: Oral  Oral   SpO2: 100% 100% 95%   Weight:   82.6 kg   Height:        Intake/Output Summary (Last 24 hours) at 03/07/2022 1152 Last data filed at 03/07/2022  0646 Gross per 24 hour  Intake 2031.49 ml  Output 800 ml  Net 1231.49 ml   Filed Weights   03/06/22 1122 03/07/22 0639  Weight: 77.1 kg 82.6 kg    Examination:  General exam: Appears calm and comfortable  Respiratory system: frequent productive cough, scattered rhonchi Cardiovascular system: RRR Gastrointestinal system: Abdomen is nondistended, soft and nontender.  Central nervous system: Alert and oriented. No focal neurological deficits. Extremities: no LEE  Data Reviewed: I have personally reviewed following labs and imaging studies  CBC: Recent Labs  Lab 03/06/22 1155 03/07/22 0159  WBC 11.0* 10.6*  NEUTROABS 8.5*  --   HGB 14.0 12.1*  HCT 42.1 36.3*  MCV 94.6 94.0  PLT 202 153    Basic Metabolic Panel: Recent Labs  Lab 03/06/22 1155 03/07/22 0159  NA 133* 128*  K 4.1 3.9  CL 95* 96*  CO2 26 23  GLUCOSE 193* 188*  BUN 13 13  CREATININE 1.28* 1.13  CALCIUM 9.4 8.6*    GFR: Estimated Creatinine Clearance: 43.8 mL/min (by C-G formula based on SCr of 1.13 mg/dL).  Liver Function Tests: Recent Labs  Lab 03/06/22 1155  AST 18  ALT 15  ALKPHOS 66  BILITOT 1.2  PROT 6.7  ALBUMIN 3.5    CBG: Recent Labs  Lab 03/06/22 1902 03/06/22 2234 03/07/22 0625  GLUCAP 147* 197* 184*     Recent Results (from the past 240 hour(s))  Culture, blood (Routine x 2)     Status: None (Preliminary result)   Collection Time: 03/06/22 11:59 AM   Specimen: BLOOD  Result Value Ref Range Status   Specimen Description BLOOD RIGHT ANTECUBITAL  Final   Special Requests   Final    BOTTLES DRAWN AEROBIC AND ANAEROBIC Blood Culture results may not be optimal due to an excessive volume of blood received in culture bottles   Culture   Final    NO GROWTH < 24 HOURS Performed at [facility], [address], [place], [place] [address]    Report Status PENDING  Incomplete  Culture, blood (Routine x 2)     Status: None (Preliminary result)   Collection Time:  03/06/22 12:48 PM   Specimen: BLOOD  Result Value Ref Range Status   Specimen Description BLOOD BLOOD RIGHT ARM  Final   Special Requests   Final    BOTTLES DRAWN AEROBIC ONLY Blood Culture results may not be optimal due to an inadequate volume of blood received in culture bottles   Culture   Final    NO GROWTH < 24 HOURS Performed at [facility], [address] [place], [place] [address]    Report Status PENDING  Incomplete  Resp Panel by RT-PCR (Flu Kali Ambler&B, Covid) Anterior Nasal Swab     Status: None   Collection Time: 03/06/22  3:14 PM   Specimen: Anterior Nasal Swab  Result Value Ref Range Status   SARS Coronavirus 2 by RT PCR NEGATIVE NEGATIVE Final    Comment: (NOTE) SARS-CoV-2 target nucleic acids are NOT DETECTED.  The SARS-CoV-2 RNA is generally detectable in upper respiratory specimens during the acute phase of infection. The lowest concentration of SARS-CoV-2 viral copies this assay can detect is 138 copies/mL. Ytzel Gubler negative result does not preclude SARS-Cov-2 infection and should not be used as the sole basis for treatment or other patient management decisions. Chesney Klimaszewski negative result may occur with  improper specimen collection/handling, submission of specimen other than nasopharyngeal swab, presence of viral mutation(s) within the areas targeted by this assay, and inadequate number of viral copies(<138 copies/mL). Kimisha Eunice negative result must be combined with clinical observations, patient history, and epidemiological information. The expected result is Negative.  Fact Sheet for Patients:  EntrepreneurPulse.com.au  Fact Sheet for Healthcare Providers:  IncredibleEmployment.be  This test is no t yet approved or cleared by the [place] FDA and  has been authorized for detection and/or diagnosis of SARS-CoV-2 by FDA under an Emergency Use Authorization (EUA). This EUA will remain  in effect (meaning this test can be used) for the  duration of the COVID-19 declaration under Section 564(b)(1) of the Act, 21 U.S.C.section 360bbb-3(b)(1), unless the authorization is terminated  or revoked sooner.       Influenza Maahi Lannan by PCR NEGATIVE NEGATIVE Final   Influenza B by PCR NEGATIVE NEGATIVE Final    Comment: (NOTE) The Xpert Xpress SARS-CoV-2/FLU/RSV plus assay is intended as an aid in the diagnosis of influenza from Nasopharyngeal swab specimens and should not be used as Lee-Ann Gal sole basis for treatment. Nasal washings and aspirates are unacceptable for Xpert Xpress SARS-CoV-2/FLU/RSV testing.  Fact Sheet for Patients: EntrepreneurPulse.com.au  Fact Sheet for Healthcare Providers: IncredibleEmployment.be  This test is not yet approved or cleared by the [place] FDA and has been authorized for detection and/or diagnosis of SARS-CoV-2 by FDA under an Emergency Use Authorization (EUA). This EUA will remain in effect (meaning this test can be used) for the duration of the COVID-19 declaration under Section 564(b)(1) of the Act, 21 U.S.C. section 360bbb-3(b)(1), unless the authorization is terminated or revoked.  Performed at [facility], [place] [address], [place], [place] [address]   Respiratory (~20 pathogens) panel by PCR     Status: Abnormal   Collection Time: 03/06/22  3:14 PM   Specimen: Nasopharyngeal Swab; Respiratory  Result Value Ref Range Status   Adenovirus NOT DETECTED NOT DETECTED Final   Coronavirus 229E NOT DETECTED NOT DETECTED Final    Comment: (NOTE) The Coronavirus on the Respiratory Panel, DOES NOT test for the novel  Coronavirus (2019 nCoV)    Coronavirus HKU1 NOT DETECTED NOT DETECTED Final   Coronavirus NL63 NOT DETECTED NOT DETECTED Final   Coronavirus OC43 NOT DETECTED NOT DETECTED Final   Metapneumovirus NOT DETECTED NOT DETECTED Final   Rhinovirus / Enterovirus DETECTED (Ceaira Ernster) NOT DETECTED Final   Influenza Xitlalic Maslin NOT DETECTED NOT DETECTED Final    Influenza B NOT DETECTED NOT DETECTED Final   Parainfluenza Virus 1 NOT DETECTED NOT DETECTED Final   Parainfluenza Virus 2 NOT DETECTED NOT DETECTED Final   Parainfluenza Virus 3 NOT DETECTED NOT DETECTED Final   Parainfluenza Virus 4 NOT DETECTED NOT DETECTED Final  Respiratory Syncytial Virus NOT DETECTED NOT DETECTED Final   Bordetella pertussis NOT DETECTED NOT DETECTED Final   Bordetella Parapertussis NOT DETECTED NOT DETECTED Final   Chlamydophila pneumoniae NOT DETECTED NOT DETECTED Final   Mycoplasma pneumoniae NOT DETECTED NOT DETECTED Final    Comment: Performed at [facility], [address], [place], [place] [address]  MRSA Next Gen by PCR, Nasal     Status: None   Collection Time: 03/07/22  8:52 AM   Specimen: Nasal Mucosa; Nasal Swab  Result Value Ref Range Status   MRSA by PCR Next Gen NOT DETECTED NOT DETECTED Final    Comment: (NOTE) The GeneXpert MRSA Assay (FDA approved for NASAL specimens only), is one component of Mckinnley Smithey comprehensive MRSA colonization surveillance program. It is not intended to diagnose MRSA infection nor to guide or monitor treatment for MRSA infections. Test performance is not FDA approved in patients less than 33 years old. Performed at [facility], [address], [place], [place] [address]          Radiology Studies: DG CHEST PORT 1 VIEW  Result Date: 03/07/2022 CLINICAL DATA:  Shortness of breath. EXAM: PORTABLE CHEST 1 VIEW COMPARISON:  03/06/2022 FINDINGS: Lungs are adequately inflated demonstrate mild patchy opacification over the mid to lower lungs without significant change which may be due to atelectasis or infection. No significant effusion. Known eventration over the anterior aspect of the right hemidiaphragm unchanged. Cardiomediastinal silhouette and remainder of the exam is unchanged. IMPRESSION: Persistent patchy opacification over the mid to lower lungs which may be due to atelectasis or infection. Electronically  Signed   By: Elberta Fortis M.D.   On: 03/07/2022 09:19   DG Chest 2 View  Result Date: 03/06/2022 CLINICAL DATA:  Suspected Sepsis EXAM: CHEST - 2 VIEW COMPARISON:  November 18, 2021 FINDINGS: The heart size and mediastinal contours are stable. Low lung volumes. Again seen is the eventration of the anterior right hemidiaphragm. Mild atelectasis at the bibasilar regions greater on the left. The visualized skeletal structures are unremarkable. IMPRESSION: Low lung volumes.  Bibasilar atelectasis greater on the left. Electronically Signed   By: Marjo Bicker M.D.   On: 03/06/2022 12:40        Scheduled Meds:  aspirin EC  81 mg Oral Daily   azithromycin  500 mg Oral Daily   docusate sodium  100 mg Oral BID   enoxaparin (LOVENOX) injection  40 mg Subcutaneous Q24H   fluticasone  2 spray Each Nare BID   gabapentin  300 mg Oral QHS   insulin aspart  0-9 Units Subcutaneous TID WC   insulin glargine-yfgn  15 Units Subcutaneous QHS   lisinopril  5 mg Oral Daily   pantoprazole  40 mg Oral QHS   rosuvastatin  10 mg Oral QHS   sodium chloride flush  3 mL Intravenous Q12H   Continuous Infusions:  sodium chloride 50 mL/hr at 03/06/22 2007   cefTRIAXone (ROCEPHIN)  IV     [START ON 03/08/2022] vancomycin     vancomycin       LOS: 0 days    Time spent: over 30 min    Lacretia Nicks, MD [facility]   To contact the attending provider between 7A-7P or the covering provider during after hours 7P-7A, please log into the web site [email or web address] and access using universal [place] password for that web site. If you do not have the password, please call the hospital operator.  03/07/2022, 11:52 AM

## 2022-03-07 NOTE — Hospital Course (Signed)
[name] is [name] 88 y.o. male with medical history significant of DM; HTN; HLD; and chronic diastolic CHF presenting with fever. He reports that he has PNA, has had it before and it's "hard to shake."  He saw Dr. Kevan Ny Tuesday and "he sounded like I needed" Leler Brion prescription, but he didn't give him one.  He reports Shanikka Wonders "light-grade" fever, occasional cough which is productive of "foam", some hoarseness.  Denies sick contacts - but his DIL is upstairs now with Kedron Uno COPD exacerbation.  He's been admitted with rhinovirus/enterovirus infection and suspected bacterial superinfection.  Treating for community acquired pneumonia.  Hopefully can discharge in next day or so pending improvement in o2 needs.

## 2022-03-07 NOTE — Assessment & Plan Note (Signed)
statin

## 2022-03-07 NOTE — Assessment & Plan Note (Signed)
Continue home basal regimen SSI

## 2022-03-07 NOTE — Progress Notes (Signed)
Pharmacy Antibiotic Note  [name] is a 88 y.o. male admitted on 03/06/2022 with pneumonia.  Pharmacy has been consulted for vancomycin dosing. Pt has PMH of CHF. BP is elevated, HR is WNL, and pt is on 4L O2 [place]. WBC is 10.6 and renal function is stable. Pt is positive for Rhino/enterovirus. MRSA PCR ordered. Will continue to follow clinical progress and deescalation.  Plan: Give vancomycin 1750 mg load Start vancomycin 1g every 24 hours to provide eAUC of 449 Levels as indicated Ceftriaxone and azithromycin per MD  Height: 5\' 7"  (170.2 cm) Weight: 82.6 kg (182 lb 1.6 oz) IBW/kg (Calculated) : 66.1  Temp (24hrs), Avg:99.3 F (37.4 C), Min:97.7 F (36.5 C), Max:101.9 F (38.8 C)  Recent Labs  Lab 03/06/22 1155 03/07/22 0159  WBC 11.0* 10.6*  CREATININE 1.28* 1.13  LATICACIDVEN 1.4  --     Estimated Creatinine Clearance: 43.8 mL/min (by C-G formula based on SCr of 1.13 mg/dL).    Allergies  Allergen Reactions   Actos [Pioglitazone] Swelling   Formaldehyde Other (See Comments)    Flu-like symptoms   Lipitor [Atorvastatin Calcium] Other (See Comments)    unknown   Metformin And Related Other (See Comments)    unknown    Antimicrobials this admission: CTX 6/16 >>  Azithro 6/16 >>  Vanc 6/17>>  Dose adjustments this admission: none  Microbiology results: 6/17 BCx: NG 6/17 MRSA PCR: IP  Thank you for allowing pharmacy to participate in this patient's care.  7/17, PharmD PGY1 Pharmacy Resident 03/07/2022 9:01 AM Check [email or web address] for unit specific pharmacy number

## 2022-03-07 NOTE — Assessment & Plan Note (Addendum)
Ruled in, fever, leukocytosis, tachypnea, hypoxia CXR with patchy opacification over mid to lower lungs, atelectasis vs infection Rhinovirus positive, given decompensation with O2 requirement, productive cough -> suspect possibility of bacteria superinfection abx started this AM MRSA pcr not detected, blood cx NGTDx2, sputum cx gram positive cocci in pairs, gram variable rod (pending) Urine strep negative, legionella pending

## 2022-03-07 NOTE — Assessment & Plan Note (Addendum)
mild Related to pulm issue? Continue to follow with IVF

## 2022-03-07 NOTE — Progress Notes (Signed)
RT attempted to preform CPT on patient X3 for 1200 round. Patient unavailable all 3 times. RT will check back at 1600.

## 2022-03-07 NOTE — Assessment & Plan Note (Addendum)
Improving, will follow Wean as tolerated with treatment of pneumonia Wean o2 as tolerated

## 2022-03-07 NOTE — Evaluation (Signed)
Physical Therapy Evaluation Patient Details Name: Jackson [name] MRN: [id number] DOB: 1929/01/02 Today's Date: 03/07/2022  History of Present Illness  Pt adm 6/16 with PNA. PMH - DM, HTN, CHF, neuropathy, djd, lt thr  Clinical Impression  Pt presents to PT with decreased mobility due to decreased strength, decreased balance, and decreased functional activity tolerance. Pt has good support at home. Will likely need HHPT at DC.         Recommendations for follow up therapy are one component of a multi-disciplinary discharge planning process, led by the attending physician.  Recommendations may be updated based on patient status, additional functional criteria and insurance authorization.  Follow Up Recommendations Home health PT    Assistance Recommended at Discharge Intermittent Supervision/Assistance  Patient can return home with the following  A little help with walking and/or transfers;Help with stairs or ramp for entrance;A little help with bathing/dressing/bathroom    Equipment Recommendations None recommended by PT  Recommendations for Other Services       Functional Status Assessment Patient has had a recent decline in their functional status and demonstrates the ability to make significant improvements in function in a reasonable and predictable amount of time.     Precautions / Restrictions Precautions Precautions: Fall Restrictions Weight Bearing Restrictions: No      Mobility  Bed Mobility               General bed mobility comments: Pt up in chair    Transfers Overall transfer level: Needs assistance Equipment used: Rollator (4 wheels) Transfers: Sit to/from Stand Sit to Stand: Min assist           General transfer comment: Assist to bring hips up and for balance. Pt with difficulty following instructions on where to place his hands on the rollator    Ambulation/Gait Ambulation/Gait assistance: Min assist Gait Distance (Feet): 80  Feet Assistive device: Rollator (4 wheels) Gait Pattern/deviations: Step-through pattern, Decreased stride length, Knee flexed in stance - right, Knee flexed in stance - left, Trunk flexed Gait velocity: decr Gait velocity interpretation: <1.8 ft/sec, indicate of risk for recurrent falls   General Gait Details: Assist for balance and support. Pt with flexed posture throughout. Could stand more erect briefly when cued but quickly returned to more flexed posture.  Stairs            Wheelchair Mobility    Modified Rankin (Stroke Patients Only)       Balance Overall balance assessment: Needs assistance Sitting-balance support: No upper extremity supported, Feet supported Sitting balance-Leahy Scale: Fair     Standing balance support: Bilateral upper extremity supported, During functional activity, Reliant on assistive device for balance Standing balance-Leahy Scale: Poor Standing balance comment: rollator and min guard for static standing                             Pertinent Vitals/Pain Pain Assessment Pain Assessment: Faces Faces Pain Scale: No hurt    Home Living Family/patient expects to be discharged to:: Private residence Living Arrangements: Spouse/significant other Available Help at Discharge: Family;Available 24 hours/day Type of Home: House Home Access: Ramped entrance       Home Layout: Able to live on main level with bedroom/bathroom Home Equipment: Rolling Walker (2 wheels);Cane - single point;BSC/3in1      Prior Function Prior Level of Function : Independent/Modified Independent             Mobility Comments: Uses cane  at times when out       Hand Dominance   Dominant Hand: Right    Extremity/Trunk Assessment   Upper Extremity Assessment Upper Extremity Assessment: Defer to OT evaluation    Lower Extremity Assessment Lower Extremity Assessment: Generalized weakness    Cervical / Trunk Assessment Cervical / Trunk  Assessment: Kyphotic  Communication   Communication: HOH  Cognition Arousal/Alertness: Awake/alert Behavior During Therapy: WFL for tasks assessed/performed Overall Cognitive Status: Impaired/Different from baseline Area of Impairment: Following commands, Problem solving, Safety/judgement, Attention                   Current Attention Level: Sustained   Following Commands: Follows one step commands with increased time Safety/Judgement: Decreased awareness of safety, Decreased awareness of deficits   Problem Solving: Slow processing, Requires tactile cues, Requires verbal cues General Comments: Some of above deficits may be due to poor hearing        General Comments General comments (skin integrity, edema, etc.): Pt on 3L of O2 with SpO2 100%.    Exercises     Assessment/Plan    PT Assessment Patient needs continued PT services  PT Problem List Decreased strength;Decreased activity tolerance;Decreased balance;Decreased mobility       PT Treatment Interventions DME instruction;Gait training;Functional mobility training;Therapeutic activities;Therapeutic exercise;Balance training;Patient/family education    PT Goals (Current goals can be found in the Care Plan section)  Acute Rehab PT Goals Patient Stated Goal: not stated PT Goal Formulation: With patient/family Time For Goal Achievement: 03/21/22 Potential to Achieve Goals: Good    Frequency Min 3X/week     Co-evaluation               AM-PAC PT "6 Clicks" Mobility  Outcome Measure Help needed turning from your back to your side while in a flat bed without using bedrails?: A Little Help needed moving from lying on your back to sitting on the side of a flat bed without using bedrails?: A Little Help needed moving to and from a bed to a chair (including a wheelchair)?: A Little Help needed standing up from a chair using your arms (e.g., wheelchair or bedside chair)?: A Little Help needed to walk in  hospital room?: A Little Help needed climbing 3-5 steps with a railing? : A Little 6 Click Score: 18    End of Session Equipment Utilized During Treatment: Oxygen Activity Tolerance: Patient limited by fatigue Patient left: in chair;with call bell/phone within reach;with chair alarm set;with family/visitor present;with nursing/sitter in room   PT Visit Diagnosis: Unsteadiness on feet (R26.81);Other abnormalities of gait and mobility (R26.89);Muscle weakness (generalized) (M62.81)    Time: 2248-2500 PT Time Calculation (min) (ACUTE ONLY): 14 min   Charges:   PT Evaluation $PT Eval Moderate Complexity: 1 Mod          [facility] PT Acute Rehabilitation Services Office [phone]   Angelina Ok [facility] 03/07/2022, 12:00 PM

## 2022-03-07 NOTE — Progress Notes (Signed)
Patient is alert and oriented with wife at the bedside. Today, pt was very congested and oxygen saturation dropped to 82 % and had to increase his oxygen to 9 L. Notified MD and got patient out of bed to chair to aid in breathing.  Pt had very coarse diminished rhonchi and was unable to cough up secretions. Requested order for chest PT. Pt was started on antibiotic therapy of PO azithromycin, IV rocephin and IV vancomycin. T IV ABT therapy was delayed in starting because pt lost IV access. Once new IV access was started, pt began therapy.  Pt remained in the chair most of the day and ambulated with physical therapy as well today. Pt is hard of hearing with no hearing aids. Pt tolerated chest PT well.  Prior to ABT, a sputum culture and MRSA swab were done. Urine and urinalysis labs were collected as well.  Pt denies pain at this time and throughout this shift. Pt was returned to bed and wife is still at the bedside. Pt oxygen was decreased back to 2 L via nasal cannula. Pt does not wear oxygen at home. No increases in temperature were noted.  Plan of care continues as ordered.

## 2022-03-07 NOTE — Evaluation (Signed)
Occupational Therapy Evaluation Patient Details Name: Jackson [name] MRN: [id number] DOB: 08/09/1929 Today's Date: 03/07/2022   History of Present Illness Pt adm 6/16 with PNA. PMH - DM, HTN, CHF, neuropathy, djd, lt thr   Clinical Impression   Pt PTA: Pt living with spouse, mostly independent with ADL and mobility with rollator. Pt currently, appears close to baseline for OOB ADL. Pt standing at sink with minguardA for grooming tasks and mobilizing with RW without staggering. Pt transfers with minguardA very little assist for power up. Pt HOH and very soft voice from coughing. Pt's spouse is supportive at home. Pt on RA with O2 sats >96%. Pt would benefit from continued OT skilled services. OT following acutely.     Recommendations for follow up therapy are one component of a multi-disciplinary discharge planning process, led by the attending physician.  Recommendations may be updated based on patient status, additional functional criteria and insurance authorization.   Follow Up Recommendations  Home health OT    Assistance Recommended at Discharge Set up Supervision/Assistance  Patient can return home with the following A little help with walking and/or transfers;A little help with bathing/dressing/bathroom;Assistance with cooking/housework;Assist for transportation;Help with stairs or ramp for entrance    Functional Status Assessment  Patient has had a recent decline in their functional status and demonstrates the ability to make significant improvements in function in a reasonable and predictable amount of time.  Equipment Recommendations  BSC/3in1    Recommendations for Other Services       Precautions / Restrictions Precautions Precautions: Fall Restrictions Weight Bearing Restrictions: No      Mobility Bed Mobility Overal bed mobility: Needs Assistance Bed Mobility: Sit to Supine       Sit to supine: Min assist, +2 for physical assistance, +2 for safety/equipment    General bed mobility comments: Pt assisted back to bed with BLEs minimally and trunk for safety.    Transfers Overall transfer level: Needs assistance Equipment used: Rolling walker (2 wheels) Transfers: Sit to/from Stand Sit to Stand: Min guard           General transfer comment: Pt scooting to edge of recliner and rocking forward a few times for momentum.      Balance Overall balance assessment: Needs assistance Sitting-balance support: No upper extremity supported, Feet supported Sitting balance-Leahy Scale: Fair     Standing balance support: Bilateral upper extremity supported, During functional activity, Reliant on assistive device for balance Standing balance-Leahy Scale: Poor Standing balance comment: rollator and min guard for static standing                           ADL either performed or assessed with clinical judgement   ADL Overall ADL's : Needs assistance/impaired Eating/Feeding: Set up;Sitting   Grooming: Min guard;Standing   Upper Body Bathing: Set up;Sitting   Lower Body Bathing: Minimal assistance;Cueing for safety;Cueing for sequencing;Sitting/lateral leans;Sit to/from stand   Upper Body Dressing : Set up;Sitting   Lower Body Dressing: Minimal assistance;Cueing for safety;Cueing for sequencing;Sitting/lateral leans;Sit to/from stand   Toilet Transfer: Min guard;Ambulation;Rolling walker (2 wheels)   Toileting- Clothing Manipulation and Hygiene: Moderate assistance;Sitting/lateral lean;Sit to/from stand;Cueing for safety Toileting - Clothing Manipulation Details (indicate cue type and reason): using male catheter     Functional mobility during ADLs: Min guard;Rolling walker (2 wheels);Cueing for safety General ADL Comments: Pt appears close to baseline for OOB ADL. pt standing at sink with minguardA for grooming tasks and  mobilizing with RW without staggering. Pt transfers with minguardA very little assist for power up. Pt's spouse is  supportive at home.     Vision Baseline Vision/History: 0 No visual deficits Ability to See in Adequate Light: 0 Adequate Patient Visual Report: No change from baseline Vision Assessment?: No apparent visual deficits     Perception     Praxis      Pertinent Vitals/Pain Pain Assessment Faces Pain Scale: No hurt     Hand Dominance Right   Extremity/Trunk Assessment Upper Extremity Assessment Upper Extremity Assessment: Generalized weakness   Lower Extremity Assessment Lower Extremity Assessment: Generalized weakness   Cervical / Trunk Assessment Cervical / Trunk Assessment: Kyphotic   Communication Communication Communication: HOH   Cognition Arousal/Alertness: Awake/alert Behavior During Therapy: WFL for tasks assessed/performed Overall Cognitive Status: Impaired/Different from baseline Area of Impairment: Following commands, Problem solving, Safety/judgement, Attention                   Current Attention Level: Sustained   Following Commands: Follows one step commands with increased time Safety/Judgement: Decreased awareness of safety, Decreased awareness of deficits   Problem Solving: Slow processing, Requires tactile cues, Requires verbal cues General Comments: Some of above deficits may be due to poor hearing. He appeared to form sentences and try to answer questions.     General Comments  Pt on RA with O2 sats >96%.    Exercises     Shoulder Instructions      Home Living Family/patient expects to be discharged to:: Private residence Living Arrangements: Spouse/significant other Available Help at Discharge: Family;Available 24 hours/day Type of Home: House Home Access: Ramped entrance     Home Layout: Able to live on main level with bedroom/bathroom     Bathroom Shower/Tub: Chief Strategy Officer: Standard     Home Equipment: Agricultural consultant (2 wheels);Cane - single point;BSC/3in1          Prior Functioning/Environment  Prior Level of Function : Independent/Modified Independent             Mobility Comments: Uses cane at times when out          OT Problem List: Decreased strength;Decreased activity tolerance;Impaired balance (sitting and/or standing);Decreased safety awareness;Cardiopulmonary status limiting activity;Pain;Increased edema      OT Treatment/Interventions: Self-care/ADL training;Therapeutic exercise;Energy conservation;DME and/or AE instruction;Therapeutic activities;Patient/family education;Balance training    OT Goals(Current goals can be found in the care plan section) Acute Rehab OT Goals Patient Stated Goal: to go home OT Goal Formulation: With patient Time For Goal Achievement: 03/21/22 Potential to Achieve Goals: Good ADL Goals Additional ADL Goal #1: Pt will increase to minguardA for OOB ADL in order to increase independence. Additional ADL Goal #2: Pt will state 3 energy conservation techniques to increase endurance.  OT Frequency: Min 2X/week    Co-evaluation              AM-PAC OT "6 Clicks" Daily Activity     Outcome Measure Help from another person eating meals?: None Help from another person taking care of personal grooming?: A Little Help from another person toileting, which includes using toliet, bedpan, or urinal?: A Little Help from another person bathing (including washing, rinsing, drying)?: A Little Help from another person to put on and taking off regular upper body clothing?: A Little Help from another person to put on and taking off regular lower body clothing?: A Little 6 Click Score: 19   End of Session Equipment Utilized During  Treatment: Gait belt;Rolling walker (2 wheels) Nurse Communication: Mobility status  Activity Tolerance: Patient tolerated treatment well Patient left: in bed;with call bell/phone within reach;with bed alarm set;with family/visitor present  OT Visit Diagnosis: Unsteadiness on feet (R26.81);Muscle weakness  (generalized) (M62.81)                Time: 9767-3419 OT Time Calculation (min): 27 min Charges:  OT General Charges $OT Visit: 1 Visit OT Evaluation $OT Eval Moderate Complexity: 1 Mod OT Treatments $Self Care/Home Management : 8-22 mins  Flora Lipps, OTR/L Acute Rehabilitation Services Office: [phone]   Lonzo Cloud 03/07/2022, 2:12 PM

## 2022-03-07 NOTE — Evaluation (Signed)
Clinical/Bedside Swallow Evaluation Patient Details  Name: Jackson [name] MRN: [id number] Date of Birth: 10-14-1928  Today's Date: 03/07/2022 Time: SLP Start Time (ACUTE ONLY): 45 SLP Stop Time (ACUTE ONLY): 1650 SLP Time Calculation (min) (ACUTE ONLY): 15 min  Past Medical History:  Past Medical History:  Diagnosis Date   Aortic stenosis    Echo 3/18: mild aortic stenosis (mean 12/peak 22 // Echo 3/19: mild AS (mean 12, peak 20) // Echo 02/2019: EF 24-82, grade 1 diastolic dysfunction, mild MAC, mild aortic stenosis (mean gradient 9), mild dilation of the ascending aorta (38 mm)    Atherosclerosis of aorta ([place]) 12/16/2016   CXR 6/16: IMPRESSION: 1. Resolved left basilar airspace opacities. 2. Hazy nodularity in both lungs compatible with the patient's known pleural plaques. 3. Right anterior hemidiaphragmatic eventration. 4. Thoracic spondylosis. 5. Atherosclerotic calcification of the aortic arch.   Bradycardia    Low HR (30s) noted during sleep during admx for sepsis in 2016; Mobitz 1, 2:1 block >> no indication for pacer; management of OSA recommended // transient CHB noted on ECG during admit in 10/2018    Diabetes mellitus without complication (HCC)    Diabetic neuropathy (HCC)    DJD (degenerative joint disease)    knee, hip   Echocardiogram    Echo 6/16: mod LVH, EF 65-70, vigorous LVF, no RWMA, mild MR, mild LAE // Echo 3/18: EF 60-65, Gr 1 DD, mild AS, PASP 47 // Echo 3/19: EF 60-65, Gr 1 DD, mild AS (mean 12) // Echo 7/19: EF 55-60, Gr 1 DD, PASP 36   Elevated troponin 06/28/2020   GERD (gastroesophageal reflux disease)    Heart murmur    HLD (hyperlipidemia)    Hypertension    Hyperthyroidism    Lexiscan Myoview 06/2018   Normal perfusion, EF 81; Low Risk   Mild diastolic dysfunction 50/11/7046   Noted on Echocardiogram; Patient does not require diuretic Rx   Phlebitis of left leg    Pneumonia 02/2015   admx with sepsis    Past Surgical History:  Past Surgical History:   Procedure Laterality Date   CATARACT EXTRACTION Right    CIRCUMCISION     FRACTURE SURGERY     TOTAL HIP ARTHROPLASTY Left 09/24/2017   Procedure: LEFT TOTAL HIP ARTHROPLASTY ANTERIOR APPROACH;  Surgeon: Mcarthur Rossetti, MD;  Location: [facility];  Service: Orthopedics;  Laterality: Left;   HPI:  88 y.o. male admitted with fever and dx of pna, sepsis.  CXR with patchy opacification over mid to lower lungs, atelectasis vs infection. PMHx DM; HTN; HLD; and chronic diastolic CHF.  He has been repeatedly seen by SLP with finding of normal oropharyngeal function, but concern for a primary esophageal issue/postprandial aspriation/GERD. Pt is reportedly aware of appropriate diet and precautions, but at times knowingly eats foods that trigger reflux because he has been tolerating well.    Assessment / Plan / Recommendation  Clinical Impression  Pt participated in clinical swallowing assessment. He had a baseline congested cough. WIfe was at bedside. She discussed his hx of reflux and his avoidance of spicy foods, particularly before bedtime, as well as keeping HOB elevated. Oral mechanism exam was normal. There did not appear to be a correlation between coughing and PO intake over the course of eating crackers, applesauce, and drinking thin liquids.  Doubt prandial aspiration as contributor to current condition. Continue regular solids/thin liquids.  No further SLP needs identified. SLP Visit Diagnosis: Dysphagia, unspecified (R13.10)    Aspiration Risk  No limitations    Diet Recommendation   Regular solids, thin liquids  Medication Administration: Whole meds with liquid    Other  Recommendations Oral Care Recommendations: Oral care BID    Recommendations for follow up therapy are one component of a multi-disciplinary discharge planning process, led by the attending physician.  Recommendations may be updated based on patient status, additional functional criteria and insurance  authorization.  Follow up Recommendations No SLP follow up        Swallow Study   General HPI: 88 y.o. male admitted with fever and dx of pna, sepsis.  CXR with patchy opacification over mid to lower lungs, atelectasis vs infection. PMHx DM; HTN; HLD; and chronic diastolic CHF.  He has been repeatedly seen by SLP with finding of normal oropharyngeal function, but concern for a primary esophageal issue/postprandial aspriation/GERD. Pt is reportedly aware of appropriate diet and precautions, but at times knowingly eats foods that trigger reflux because he has been tolerating well. Type of Study: Bedside Swallow Evaluation Previous Swallow Assessment: see HPI Diet Prior to this Study: Regular;Thin liquids Temperature Spikes Noted: Yes Respiratory Status: Room air History of Recent Intubation: No Behavior/Cognition: Alert Oral Cavity Assessment: Within Functional Limits Oral Care Completed by SLP: No Vision: Functional for self-feeding Self-Feeding Abilities: Able to feed self Patient Positioning: Upright in bed Baseline Vocal Quality: Wet Volitional Cough: Congested Volitional Swallow: Able to elicit    Oral/Motor/Sensory Function Overall Oral Motor/Sensory Function: Within functional limits   Ice Chips Ice chips: Within functional limits   Thin Liquid Thin Liquid: Within functional limits    Nectar Thick Nectar Thick Liquid: Not tested   Honey Thick Honey Thick Liquid: Not tested   Puree Puree: Within functional limits   Solid     Solid: Within functional limits      Juan Quam Laurice 03/07/2022,4:55 PM Estill Bamberg L. Tivis Ringer, MA CCC/SLP Clinical Specialist - [place] Office number [phone]

## 2022-03-07 NOTE — Assessment & Plan Note (Signed)
lisinopril 

## 2022-03-08 DIAGNOSIS — R509 Fever, unspecified: Secondary | ICD-10-CM | POA: Diagnosis not present

## 2022-03-08 LAB — COMPREHENSIVE METABOLIC PANEL
ALT: 17 U/L (ref 0–44)
AST: 20 U/L (ref 15–41)
Albumin: 2.7 g/dL — ABNORMAL LOW (ref 3.5–5.0)
Alkaline Phosphatase: 67 U/L (ref 38–126)
Anion gap: 13 (ref 5–15)
BUN: 14 mg/dL (ref 8–23)
CO2: 23 mmol/L (ref 22–32)
Calcium: 8.8 mg/dL — ABNORMAL LOW (ref 8.9–10.3)
Chloride: 95 mmol/L — ABNORMAL LOW (ref 98–111)
Creatinine, Ser: 1.08 mg/dL (ref 0.61–1.24)
GFR, Estimated: >60 mL/min (ref >60)
Glucose, Bld: 169 mg/dL — ABNORMAL HIGH (ref 70–99)
Potassium: 3.8 mmol/L (ref 3.5–5.1)
Sodium: 131 mmol/L — ABNORMAL LOW (ref 135–145)
Total Bilirubin: 0.8 mg/dL (ref 0.3–1.2)
Total Protein: 5.7 g/dL — ABNORMAL LOW (ref 6.5–8.1)

## 2022-03-08 LAB — BASIC METABOLIC PANEL
Anion gap: 14 (ref 5–15)
BUN: 15 mg/dL (ref 8–23)
CO2: 21 mmol/L — ABNORMAL LOW (ref 22–32)
Calcium: 9.2 mg/dL (ref 8.9–10.3)
Chloride: 96 mmol/L — ABNORMAL LOW (ref 98–111)
Creatinine, Ser: 1.08 mg/dL (ref 0.61–1.24)
GFR, Estimated: >60 mL/min (ref >60)
Glucose, Bld: 195 mg/dL — ABNORMAL HIGH (ref 70–99)
Potassium: 4.2 mmol/L (ref 3.5–5.1)
Sodium: 131 mmol/L — ABNORMAL LOW (ref 135–145)

## 2022-03-08 LAB — CBC WITH DIFFERENTIAL/PLATELET
Abs Immature Granulocytes: 0.05 10*3/uL (ref 0.00–0.07)
Basophils Absolute: 0 10*3/uL (ref 0.0–0.1)
Basophils Relative: 0 %
Eosinophils Absolute: 0.4 10*3/uL (ref 0.0–0.5)
Eosinophils Relative: 4 %
HCT: 34.5 % — ABNORMAL LOW (ref 39.0–52.0)
Hemoglobin: 11.6 g/dL — ABNORMAL LOW (ref 13.0–17.0)
Immature Granulocytes: 1 %
Lymphocytes Relative: 10 %
Lymphs Abs: 1 10*3/uL (ref 0.7–4.0)
MCH: 31.8 pg (ref 26.0–34.0)
MCHC: 33.6 g/dL (ref 30.0–36.0)
MCV: 94.5 fL (ref 80.0–100.0)
Monocytes Absolute: 1.3 10*3/uL — ABNORMAL HIGH (ref 0.1–1.0)
Monocytes Relative: 14 %
Neutro Abs: 7.2 10*3/uL (ref 1.7–7.7)
Neutrophils Relative %: 71 %
Platelets: 164 10*3/uL (ref 150–400)
RBC: 3.65 MIL/uL — ABNORMAL LOW (ref 4.22–5.81)
RDW: 12.4 % (ref 11.5–15.5)
WBC: 9.9 10*3/uL (ref 4.0–10.5)
nRBC: 0 % (ref 0.0–0.2)

## 2022-03-08 LAB — MAGNESIUM: Magnesium: 1.6 mg/dL — ABNORMAL LOW (ref 1.7–2.4)

## 2022-03-08 LAB — GLUCOSE, CAPILLARY
Glucose-Capillary: 172 mg/dL — ABNORMAL HIGH (ref 70–99)
Glucose-Capillary: 189 mg/dL — ABNORMAL HIGH (ref 70–99)
Glucose-Capillary: 194 mg/dL — ABNORMAL HIGH (ref 70–99)
Glucose-Capillary: 178 mg/dL — ABNORMAL HIGH (ref 70–99)

## 2022-03-08 LAB — PHOSPHORUS: Phosphorus: 2.1 mg/dL — ABNORMAL LOW (ref 2.5–4.6)

## 2022-03-08 MED ORDER — K PHOS MONO-SOD PHOS DI & MONO 155-852-130 MG PO TABS
500.0000 mg | ORAL_TABLET | Freq: Four times a day (QID) | ORAL | Status: AC
  Administered 2022-03-08 (×3): 500 mg via ORAL
  Filled 2022-03-08 (×3): qty 2

## 2022-03-08 MED ORDER — MAGNESIUM SULFATE 2 GM/50ML IV SOLN
2.0000 g | Freq: Once | INTRAVENOUS | Status: AC
  Administered 2022-03-08: 2 g via INTRAVENOUS
  Filled 2022-03-08: qty 50

## 2022-03-08 NOTE — Progress Notes (Signed)
Per CCMD report patient reads 3rd degree block in the monitor around 1905. Pt vital sign BP (!) 159/67 (BP Location: Right Arm)   Pulse 75   Temp 99 F (37.2 C) (Oral)   Resp 17   Ht 5\' 7"  (1.702 m)   Wt 82.1 kg   SpO2 98%   BMI 28.35 kg/m    Pt not complaining of any discomfort. EKG done. MD made aware.

## 2022-03-08 NOTE — Progress Notes (Signed)
CPT not preformed at this time due to patient resting comfortably. Patient not in any distress at this time.

## 2022-03-08 NOTE — Progress Notes (Signed)
PROGRESS NOTE    [name]  TKP:[id number] DOB: 02-24-1929 DOA: 03/06/2022 PCP: Marden Noble, MD  Chief Complaint  Patient presents with   Fever    Brief Narrative:  [name] is [name] 88 y.o. male with medical history significant of DM; HTN; HLD; and chronic diastolic CHF presenting with fever. He reports that he has PNA, has had it before and it's "hard to shake."  He saw Dr. Kevan Ny Tuesday and "he sounded like I needed" Anyi Fels prescription, but he didn't give him one.  He reports Deedee Lybarger "light-grade" fever, occasional cough which is productive of "foam", some hoarseness.  Denies sick contacts - but his DIL is upstairs now with Annistyn Depass COPD exacerbation.    Assessment & Plan:   Principal Problem:   Fever Active Problems:   Sepsis due to pneumonia (HCC)   CAP (community acquired pneumonia)   Rhinovirus infection   Respiratory failure, acute (HCC)   Elevated serum creatinine   Hyponatremia   Type 2 diabetes mellitus (HCC)   Essential hypertension   Dyslipidemia   DNR (do not resuscitate)   Type 2 diabetes mellitus with hyperglycemia (HCC)   Pneumonia   Assessment and Plan: Sepsis due to pneumonia (HCC) Ruled in, fever, leukocytosis, tachypnea, hypoxia CXR with patchy opacification over mid to lower lungs, atelectasis vs infection Rhinovirus positive, given decompensation with O2 requirement, productive cough -> suspect possibility of bacteria superinfection abx started this AM MRSA pcr not detected, blood cx NGTDx2, sputum cx gram positive cocci in pairs, gram variable rod (pending) Urine strep negative, legionella pending   Respiratory failure, acute (HCC) Improving, will follow Wean as tolerated with treatment of pneumonia  Hyponatremia mild Related to pulm issue? Continue to follow with IVF  Elevated serum creatinine Creatinine 1.28 on presentation Unclear recent baseline Continues to improve  Dyslipidemia statin  Essential hypertension lisinopril  Type 2  diabetes mellitus (HCC) Continue home basal regimen SSI     DVT prophylaxis: lovenox Code Status: dnr Family Communication: wife at bedside Disposition:   Status is: Observation The patient will require care spanning > 2 midnights and should be moved to inpatient because: need for IV abx, weaning O2   Consultants:  none  Procedures:  none  Antimicrobials:  Anti-infectives (From admission, onward)    Start     Dose/Rate Route Frequency Ordered Stop   03/08/22 1000  vancomycin (VANCOCIN) IVPB 1000 mg/200 mL premix  Status:  Discontinued        1,000 mg 200 mL/hr over 60 Minutes Intravenous Every 24 hours 03/07/22 0903 03/07/22 1919   03/07/22 1000  cefTRIAXone (ROCEPHIN) 1 g in sodium chloride 0.9 % 100 mL IVPB        1 g 200 mL/hr over 30 Minutes Intravenous Every 24 hours 03/07/22 0822     03/07/22 1000  azithromycin (ZITHROMAX) tablet 500 mg        500 mg Oral Daily 03/07/22 0822     03/07/22 1000  vancomycin (VANCOREADY) IVPB 1750 mg/350 mL        1,750 mg 175 mL/hr over 120 Minutes Intravenous  Once 03/07/22 0903 03/08/22 1315   03/06/22 1330  cefTRIAXone (ROCEPHIN) 2 g in sodium chloride 0.9 % 100 mL IVPB  Status:  Discontinued        2 g 200 mL/hr over 30 Minutes Intravenous Every 24 hours 03/06/22 1315 03/06/22 1514   03/06/22 1330  azithromycin (ZITHROMAX) 500 mg in sodium chloride 0.9 % 250 mL IVPB  Status:  Discontinued        500 mg 250 mL/hr over 60 Minutes Intravenous Every 24 hours 03/06/22 1315 03/06/22 1514       Subjective: Continues to have cough and SOB Wife notes she thinks he needs 1 more day here  Objective: Vitals:   03/08/22 0400 03/08/22 0739 03/08/22 0900 03/08/22 1140  BP: (!) 143/72  (!) 146/54   Pulse: 77  67   Resp: 20  20   Temp: 98.1 F (36.7 C)  98 F (36.7 C)   TempSrc: Oral  Oral   SpO2: 99% 100% 100% 97%  Weight: 82.1 kg     Height:        Intake/Output Summary (Last 24 hours) at 03/08/2022 1334 Last data filed at  03/08/2022 0902 Gross per 24 hour  Intake 1401.68 ml  Output 850 ml  Net 551.68 ml   Filed Weights   03/06/22 1122 03/07/22 0639 03/08/22 0400  Weight: 77.1 kg 82.6 kg 82.1 kg    Examination:  General: No acute distress. Sitting up in chair. Cardiovascular: RRR Lungs: good air movement, coarse cough Abdomen: Soft, nontender, nondistended  Neurological: Alert and oriented 3. Moves all extremities 4 with equal strength. Cranial nerves II through XII grossly intact. Extremities: No clubbing or cyanosis. No edema.   Data Reviewed: I have personally reviewed following labs and imaging studies  CBC: Recent Labs  Lab 03/06/22 1155 03/07/22 0159 03/08/22 0323  WBC 11.0* 10.6* 9.9  NEUTROABS 8.5*  --  7.2  HGB 14.0 12.1* 11.6*  HCT 42.1 36.3* 34.5*  MCV 94.6 94.0 94.5  PLT 202 153 [id number]    Basic Metabolic Panel: Recent Labs  Lab 03/06/22 1155 03/07/22 0159 03/08/22 0323  NA 133* 128* 131*  K 4.1 3.9 3.8  CL 95* 96* 95*  CO2 26 23 23   GLUCOSE 193* 188* 169*  BUN 13 13 14   CREATININE 1.28* 1.13 1.08  CALCIUM 9.4 8.6* 8.8*  MG  --   --  1.6*  PHOS  --   --  2.1*    GFR: Estimated Creatinine Clearance: 45.7 mL/min (by C-G formula based on SCr of 1.08 mg/dL).  Liver Function Tests: Recent Labs  Lab 03/06/22 1155 03/08/22 0323  AST 18 20  ALT 15 17  ALKPHOS 66 67  BILITOT 1.2 0.8  PROT 6.7 5.7*  ALBUMIN 3.5 2.7*    CBG: Recent Labs  Lab 03/07/22 1216 03/07/22 1700 03/07/22 2203 03/08/22 0618 03/08/22 1147  GLUCAP 197* 138* 199* 172* 189*     Recent Results (from the past 240 hour(s))  Culture, blood (Routine x 2)     Status: None (Preliminary result)   Collection Time: 03/06/22 11:59 AM   Specimen: BLOOD  Result Value Ref Range Status   Specimen Description BLOOD RIGHT ANTECUBITAL  Final   Special Requests   Final    BOTTLES DRAWN AEROBIC AND ANAEROBIC Blood Culture results may not be optimal due to an excessive volume of blood received in  culture bottles   Culture   Final    NO GROWTH 2 DAYS Performed at [facility] [address] [place], [place] [address]    Report Status PENDING  Incomplete  Culture, blood (Routine x 2)     Status: None (Preliminary result)   Collection Time: 03/06/22 12:48 PM   Specimen: BLOOD  Result Value Ref Range Status   Specimen Description BLOOD BLOOD RIGHT ARM  Final   Special Requests   Final  BOTTLES DRAWN AEROBIC ONLY Blood Culture results may not be optimal due to an inadequate volume of blood received in culture bottles   Culture   Final    NO GROWTH 2 DAYS Performed at [facility], [place] [address] [place], [place], [place] [address]    Report Status PENDING  Incomplete  Resp Panel by RT-PCR (Flu Bathsheba Durrett&B, Covid) Anterior Nasal Swab     Status: None   Collection Time: 03/06/22  3:14 PM   Specimen: Anterior Nasal Swab  Result Value Ref Range Status   SARS Coronavirus 2 by RT PCR NEGATIVE NEGATIVE Final    Comment: (NOTE) SARS-CoV-2 target nucleic acids are NOT DETECTED.  The SARS-CoV-2 RNA is generally detectable in upper respiratory specimens during the acute phase of infection. The lowest concentration of SARS-CoV-2 viral copies this assay can detect is 138 copies/mL. Jennavie Martinek negative result does not preclude SARS-Cov-2 infection and should not be used as the sole basis for treatment or other patient management decisions. Remigio Mcmillon negative result may occur with  improper specimen collection/handling, submission of specimen other than nasopharyngeal swab, presence of viral mutation(s) within the areas targeted by this assay, and inadequate number of viral copies(<138 copies/mL). Starletta Houchin negative result must be combined with clinical observations, patient history, and epidemiological information. The expected result is Negative.  Fact Sheet for Patients:  EntrepreneurPulse.com.au  Fact Sheet for Healthcare Providers:  IncredibleEmployment.be  This test  is no t yet approved or cleared by the [place] FDA and  has been authorized for detection and/or diagnosis of SARS-CoV-2 by FDA under an Emergency Use Authorization (EUA). This EUA will remain  in effect (meaning this test can be used) for the duration of the COVID-19 declaration under Section 564(b)(1) of the Act, 21 U.S.C.section 360bbb-3(b)(1), unless the authorization is terminated  or revoked sooner.       Influenza Aileene Lanum by PCR NEGATIVE NEGATIVE Final   Influenza B by PCR NEGATIVE NEGATIVE Final    Comment: (NOTE) The Xpert Xpress SARS-CoV-2/FLU/RSV plus assay is intended as an aid in the diagnosis of influenza from Nasopharyngeal swab specimens and should not be used as Zoran Yankee sole basis for treatment. Nasal washings and aspirates are unacceptable for Xpert Xpress SARS-CoV-2/FLU/RSV testing.  Fact Sheet for Patients: EntrepreneurPulse.com.au  Fact Sheet for Healthcare Providers: IncredibleEmployment.be  This test is not yet approved or cleared by the [place] FDA and has been authorized for detection and/or diagnosis of SARS-CoV-2 by FDA under an Emergency Use Authorization (EUA). This EUA will remain in effect (meaning this test can be used) for the duration of the COVID-19 declaration under Section 564(b)(1) of the Act, 21 U.S.C. section 360bbb-3(b)(1), unless the authorization is terminated or revoked.  Performed at [facility], [place] [address] [place], [place] [address]   Respiratory (~20 pathogens) panel by PCR     Status: Abnormal   Collection Time: 03/06/22  3:14 PM   Specimen: Nasopharyngeal Swab; Respiratory  Result Value Ref Range Status   Adenovirus NOT DETECTED NOT DETECTED Final   Coronavirus 229E NOT DETECTED NOT DETECTED Final    Comment: (NOTE) The Coronavirus on the Respiratory Panel, DOES NOT test for the novel  Coronavirus (2019 nCoV)    Coronavirus HKU1 NOT DETECTED NOT DETECTED Final   Coronavirus  NL63 NOT DETECTED NOT DETECTED Final   Coronavirus OC43 NOT DETECTED NOT DETECTED Final   Metapneumovirus NOT DETECTED NOT DETECTED Final   Rhinovirus / Enterovirus DETECTED (Kelci Petrella) NOT DETECTED Final   Influenza Naria Abbey  NOT DETECTED NOT DETECTED Final   Influenza B NOT DETECTED NOT DETECTED Final   Parainfluenza Virus 1 NOT DETECTED NOT DETECTED Final   Parainfluenza Virus 2 NOT DETECTED NOT DETECTED Final   Parainfluenza Virus 3 NOT DETECTED NOT DETECTED Final   Parainfluenza Virus 4 NOT DETECTED NOT DETECTED Final   Respiratory Syncytial Virus NOT DETECTED NOT DETECTED Final   Bordetella pertussis NOT DETECTED NOT DETECTED Final   Bordetella Parapertussis NOT DETECTED NOT DETECTED Final   Chlamydophila pneumoniae NOT DETECTED NOT DETECTED Final   Mycoplasma pneumoniae NOT DETECTED NOT DETECTED Final    Comment: Performed at [facility], [address] [place], [place] [address]  Expectorated Sputum Assessment w Gram Stain, Rflx to Resp Cult     Status: None   Collection Time: 03/07/22  8:42 AM   Specimen: Expectorated Sputum  Result Value Ref Range Status   Specimen Description EXPECTORATED SPUTUM  Final   Special Requests NONE  Final   Sputum evaluation   Final    THIS SPECIMEN IS ACCEPTABLE FOR SPUTUM CULTURE Performed at [facility], [address] [place], [place] [address]    Report Status 03/07/2022 FINAL  Final  Culture, Respiratory w Gram Stain     Status: None (Preliminary result)   Collection Time: 03/07/22  8:42 AM  Result Value Ref Range Status   Specimen Description EXPECTORATED SPUTUM  Final   Special Requests NONE Reflexed from [id number]  Final   Gram Stain   Final    RARE WBC PRESENT,BOTH PMN AND MONONUCLEAR RARE GRAM POSITIVE COCCI IN PAIRS RARE GRAM VARIABLE ROD    Culture   Final    CULTURE REINCUBATED FOR BETTER GROWTH Performed at [facility], [address] [place], [place] [address]    Report Status PENDING  Incomplete  MRSA Next Gen by  PCR, Nasal     Status: None   Collection Time: 03/07/22  8:52 AM   Specimen: Nasal Mucosa; Nasal Swab  Result Value Ref Range Status   MRSA by PCR Next Gen NOT DETECTED NOT DETECTED Final    Comment: (NOTE) The GeneXpert MRSA Assay (FDA approved for NASAL specimens only), is one component of Teaghan Melrose comprehensive MRSA colonization surveillance program. It is not intended to diagnose MRSA infection nor to guide or monitor treatment for MRSA infections. Test performance is not FDA approved in patients less than 37 years old. Performed at [facility], [address], [place], [place] [address]          Radiology Studies: DG CHEST PORT 1 VIEW  Result Date: 03/07/2022 CLINICAL DATA:  Shortness of breath. EXAM: PORTABLE CHEST 1 VIEW COMPARISON:  03/06/2022 FINDINGS: Lungs are adequately inflated demonstrate mild patchy opacification over the mid to lower lungs without significant change which may be due to atelectasis or infection. No significant effusion. Known eventration over the anterior aspect of the right hemidiaphragm unchanged. Cardiomediastinal silhouette and remainder of the exam is unchanged. IMPRESSION: Persistent patchy opacification over the mid to lower lungs which may be due to atelectasis or infection. Electronically Signed   By: Elberta Fortis M.D.   On: 03/07/2022 09:19        Scheduled Meds:  aspirin EC  81 mg Oral Daily   azithromycin  500 mg Oral Daily   docusate sodium  100 mg Oral BID   enoxaparin (LOVENOX) injection  40 mg Subcutaneous Q24H   fluticasone  2 spray Each Nare BID  gabapentin  300 mg Oral QHS   insulin aspart  0-9 Units Subcutaneous TID WC   insulin glargine-yfgn  15 Units Subcutaneous QHS   levalbuterol  0.63 mg Nebulization TID   lisinopril  5 mg Oral Daily   pantoprazole  40 mg Oral QHS   phosphorus  500 mg Oral QID   rosuvastatin  10 mg Oral QHS   sodium chloride flush  3 mL Intravenous Q12H   Continuous Infusions:  sodium chloride 50  mL/hr at 03/08/22 0429   cefTRIAXone (ROCEPHIN)  IV 1 g (03/08/22 1046)     LOS: 1 day    Time spent: over 30 min    Fayrene Helper, MD [facility]   To contact the attending provider between 7A-7P or the covering provider during after hours 7P-7A, please log into the web site [email or web address] and access using universal [place] password for that web site. If you do not have the password, please call the hospital operator.  03/08/2022, 1:34 PM

## 2022-03-08 NOTE — Plan of Care (Signed)
  Problem: Health Behavior/Discharge Planning: Goal: Ability to manage health-related needs will improve Outcome: Progressing   Problem: Clinical Measurements: Goal: Ability to maintain clinical measurements within normal limits will improve Outcome: Progressing Goal: Will remain free from infection Outcome: Progressing Goal: Cardiovascular complication will be avoided Outcome: Progressing   Problem: Activity: Goal: Risk for activity intolerance will decrease Outcome: Progressing   Problem: Nutrition: Goal: Adequate nutrition will be maintained Outcome: Progressing   

## 2022-03-09 ENCOUNTER — Encounter (HOSPITAL_COMMUNITY): Payer: Self-pay | Admitting: Family Medicine

## 2022-03-09 DIAGNOSIS — R509 Fever, unspecified: Secondary | ICD-10-CM | POA: Diagnosis not present

## 2022-03-09 DIAGNOSIS — R001 Bradycardia, unspecified: Secondary | ICD-10-CM

## 2022-03-09 DIAGNOSIS — I442 Atrioventricular block, complete: Secondary | ICD-10-CM

## 2022-03-09 LAB — GLUCOSE, CAPILLARY
Glucose-Capillary: 162 mg/dL — ABNORMAL HIGH (ref 70–99)
Glucose-Capillary: 184 mg/dL — ABNORMAL HIGH (ref 70–99)
Glucose-Capillary: 167 mg/dL — ABNORMAL HIGH (ref 70–99)
Glucose-Capillary: 158 mg/dL — ABNORMAL HIGH (ref 70–99)

## 2022-03-09 LAB — COMPREHENSIVE METABOLIC PANEL
ALT: 23 U/L (ref 0–44)
AST: 23 U/L (ref 15–41)
Albumin: 2.6 g/dL — ABNORMAL LOW (ref 3.5–5.0)
Alkaline Phosphatase: 79 U/L (ref 38–126)
Anion gap: 8 (ref 5–15)
BUN: 13 mg/dL (ref 8–23)
CO2: 27 mmol/L (ref 22–32)
Calcium: 8.9 mg/dL (ref 8.9–10.3)
Chloride: 97 mmol/L — ABNORMAL LOW (ref 98–111)
Creatinine, Ser: 1.06 mg/dL (ref 0.61–1.24)
GFR, Estimated: >60 mL/min (ref >60)
Glucose, Bld: 171 mg/dL — ABNORMAL HIGH (ref 70–99)
Potassium: 4 mmol/L (ref 3.5–5.1)
Sodium: 132 mmol/L — ABNORMAL LOW (ref 135–145)
Total Bilirubin: 0.7 mg/dL (ref 0.3–1.2)
Total Protein: 5.8 g/dL — ABNORMAL LOW (ref 6.5–8.1)

## 2022-03-09 LAB — CULTURE, RESPIRATORY W GRAM STAIN: Culture: NORMAL

## 2022-03-09 LAB — CBC WITH DIFFERENTIAL/PLATELET
Abs Immature Granulocytes: 0.04 10*3/uL (ref 0.00–0.07)
Basophils Absolute: 0 10*3/uL (ref 0.0–0.1)
Basophils Relative: 0 %
Eosinophils Absolute: 0.3 10*3/uL (ref 0.0–0.5)
Eosinophils Relative: 4 %
HCT: 34.8 % — ABNORMAL LOW (ref 39.0–52.0)
Hemoglobin: 11.7 g/dL — ABNORMAL LOW (ref 13.0–17.0)
Immature Granulocytes: 1 %
Lymphocytes Relative: 12 %
Lymphs Abs: 1 10*3/uL (ref 0.7–4.0)
MCH: 31.3 pg (ref 26.0–34.0)
MCHC: 33.6 g/dL (ref 30.0–36.0)
MCV: 93 fL (ref 80.0–100.0)
Monocytes Absolute: 1.3 10*3/uL — ABNORMAL HIGH (ref 0.1–1.0)
Monocytes Relative: 15 %
Neutro Abs: 6 10*3/uL (ref 1.7–7.7)
Neutrophils Relative %: 68 %
Platelets: 182 10*3/uL (ref 150–400)
RBC: 3.74 MIL/uL — ABNORMAL LOW (ref 4.22–5.81)
RDW: 12.4 % (ref 11.5–15.5)
WBC: 8.7 10*3/uL (ref 4.0–10.5)
nRBC: 0 % (ref 0.0–0.2)

## 2022-03-09 LAB — PHOSPHORUS: Phosphorus: 3.1 mg/dL (ref 2.5–4.6)

## 2022-03-09 LAB — LEGIONELLA PNEUMOPHILA SEROGP 1 UR AG: L. pneumophila Serogp 1 Ur Ag: NEGATIVE

## 2022-03-09 LAB — MAGNESIUM: Magnesium: 1.9 mg/dL (ref 1.7–2.4)

## 2022-03-09 NOTE — Assessment & Plan Note (Signed)
Concern on tele for complete heart block EKG this morning with 2nd degree AV nodal block type 1 Will c/s cardiology for assistance He's not on any av nodal blocking med

## 2022-03-09 NOTE — Progress Notes (Signed)
Patient is resuming antibiotic therapy this morning. Droplet and contact precautions continuing. Pt is alert and oriented x 4 and has a very productive cough of thick yellowish mucous. Denies pain. Eating breakfast.

## 2022-03-09 NOTE — Consult Note (Incomplete)
Cardiology Consultation:   Patient ID: Jackson [name] MRN: [id number]; DOB: 08-05-1929  Admit date: 03/06/2022 Date of Consult: 03/09/2022  PCP:  Josetta Huddle, MD   [facility] Providers Cardiologist:  Virl Axe, MD        Patient Profile:   Jackson [name] is a 88 y.o. male with a hx of  DM; HTN; HLD; COPD, and chronic diastolic CHF, who is being seen 03/09/2022 for the evaluation of heart block at the request of Dr Florene Glen.  History of Present Illness:   [name] was admitted 06/16 with sepsis due to PNA, on ABX for CAP. His ECG on 06/18 was Mobitz 1, normal HR   Past Medical History:  Diagnosis Date   Aortic stenosis    Echo 3/18: mild aortic stenosis (mean 12/peak 22 // Echo 3/19: mild AS (mean 12, peak 20) // Echo 02/2019: EF 66-59, grade 1 diastolic dysfunction, mild MAC, mild aortic stenosis (mean gradient 9), mild dilation of the ascending aorta (38 mm)    Atherosclerosis of aorta ([place]) 12/16/2016   CXR 6/16: IMPRESSION: 1. Resolved left basilar airspace opacities. 2. Hazy nodularity in both lungs compatible with the patient's known pleural plaques. 3. Right anterior hemidiaphragmatic eventration. 4. Thoracic spondylosis. 5. Atherosclerotic calcification of the aortic arch.   Bradycardia    Low HR (30s) noted during sleep during admx for sepsis in 2016; Mobitz 1, 2:1 block >> no indication for pacer; management of OSA recommended // transient CHB noted on ECG during admit in 10/2018    Diabetes mellitus without complication (HCC)    Diabetic neuropathy (HCC)    DJD (degenerative joint disease)    knee, hip   Echocardiogram    Echo 6/16: mod LVH, EF 65-70, vigorous LVF, no RWMA, mild MR, mild LAE // Echo 3/18: EF 60-65, Gr 1 DD, mild AS, PASP 47 // Echo 3/19: EF 60-65, Gr 1 DD, mild AS (mean 12) // Echo 7/19: EF 55-60, Gr 1 DD, PASP 36   Elevated troponin 06/28/2020   GERD (gastroesophageal reflux disease)    Heart murmur    HLD (hyperlipidemia)    Hypertension     Hyperthyroidism    Lexiscan Myoview 06/2018   Normal perfusion, EF 81; Low Risk   Mild diastolic dysfunction 93/57/0177   Noted on Echocardiogram; Patient does not require diuretic Rx   Phlebitis of left leg    Pneumonia 02/2015   admx with sepsis     Past Surgical History:  Procedure Laterality Date   CATARACT EXTRACTION Right    CIRCUMCISION     FRACTURE SURGERY     TOTAL HIP ARTHROPLASTY Left 09/24/2017   Procedure: LEFT TOTAL HIP ARTHROPLASTY ANTERIOR APPROACH;  Surgeon: Mcarthur Rossetti, MD;  Location: [facility];  Service: Orthopedics;  Laterality: Left;     {Home Medications (Optional):21181}  Inpatient Medications: Scheduled Meds:  aspirin EC  81 mg Oral Daily   azithromycin  500 mg Oral Daily   docusate sodium  100 mg Oral BID   enoxaparin (LOVENOX) injection  40 mg Subcutaneous Q24H   fluticasone  2 spray Each Nare BID   gabapentin  300 mg Oral QHS   insulin aspart  0-9 Units Subcutaneous TID WC   insulin glargine-yfgn  15 Units Subcutaneous QHS   levalbuterol  0.63 mg Nebulization TID   lisinopril  5 mg Oral Daily   pantoprazole  40 mg Oral QHS   rosuvastatin  10 mg Oral QHS   sodium chloride flush  3  mL Intravenous Q12H   Continuous Infusions:  sodium chloride 50 mL/hr at 03/09/22 0626   cefTRIAXone (ROCEPHIN)  IV 1 g (03/09/22 1258)   PRN Meds: acetaminophen **OR** acetaminophen, albuterol, bisacodyl, guaiFENesin, hydrALAZINE, morphine injection, naphazoline-glycerin, ondansetron **OR** ondansetron (ZOFRAN) IV, oxyCODONE, polyethylene glycol  Allergies:    Allergies  Allergen Reactions   Actos [Pioglitazone] Swelling   Formaldehyde Other (See Comments)    Flu-like symptoms   Lipitor [Atorvastatin Calcium] Other (See Comments)    unknown   Metformin And Related Other (See Comments)    unknown    Social History:   Social History   Socioeconomic History   Marital status: Married    Spouse name: Not on file   Number of children: Not on file    Years of education: Not on file   Highest education level: Not on file  Occupational History   Occupation: retired  Tobacco Use   Smoking status: Former    Types: Cigarettes    Quit date: 1960    Years since quitting: 63.5   Smokeless tobacco: Never  Vaping Use   Vaping Use: Never used  Substance and Sexual Activity   Alcohol use: No   Drug use: No   Sexual activity: Not on file  Other Topics Concern   Not on file  Social History Narrative   Not on file   Social Determinants of Health   Financial Resource Strain: Not on file  Food Insecurity: Not on file  Transportation Needs: Not on file  Physical Activity: Not on file  Stress: Not on file  Social Connections: Not on file  Intimate Partner Violence: Not on file    Family History:   *** Family History  Problem Relation Age of Onset   Diabetes Mother    Diabetes Sister    Diabetes Brother    Heart attack Neg Hx    Stroke Neg Hx    Hypertension Neg Hx      ROS:  Please see the history of present illness.  *** All other ROS reviewed and negative.     Physical Exam/Data:   Vitals:   03/08/22 2031 03/09/22 0521 03/09/22 0522 03/09/22 0740  BP: (!) 159/67  (!) 160/62   Pulse: 75  77   Resp:      Temp: 99 F (37.2 C)  97.6 F (36.4 C)   TempSrc: Oral  Oral   SpO2: 98%  91% 95%  Weight:  80.5 kg    Height:        Intake/Output Summary (Last 24 hours) at 03/09/2022 1519 Last data filed at 03/09/2022 1251 Gross per 24 hour  Intake 1342.67 ml  Output --  Net 1342.67 ml      03/09/2022    5:21 AM 03/08/2022    4:00 AM 03/07/2022    6:39 AM  Last 3 Weights  Weight (lbs) 177 lb 7.5 oz 181 lb 182 lb 1.6 oz  Weight (kg) 80.5 kg 82.1 kg 82.6 kg     Body mass index is 27.8 kg/m.  General:  Well nourished, well developed, in no acute distress*** HEENT: normal Neck: no JVD Vascular: No carotid bruits; Distal pulses 2+ bilaterally Cardiac:  normal S1, S2; RRR; no murmur *** Lungs:  clear to auscultation  bilaterally, no wheezing, rhonchi or rales  Abd: soft, nontender, no hepatomegaly  Ext: no edema Musculoskeletal:  No deformities, BUE and BLE strength normal and equal Skin: warm and dry  Neuro:  CNs 2-12 intact, no focal  abnormalities noted Psych:  Normal affect   EKG:  The EKG was personally reviewed and demonstrates:  03/08/2022 ECG is Mobitz I, HR 67 Telemetry:  Telemetry was personally reviewed and demonstrates:  ***  Relevant CV Studies:  ECHO:    Laboratory Data:  High Sensitivity Troponin:  No results for input(s): "TROPONINIHS" in the last 720 hours.   Chemistry Recent Labs  Lab 03/08/22 0323 03/08/22 1441 03/09/22 0440  NA 131* 131* 132*  K 3.8 4.2 4.0  CL 95* 96* 97*  CO2 23 21* 27  GLUCOSE 169* 195* 171*  BUN _0 CREATININE 1.08 1.08 1.06  CALCIUM 8.8* 9.2 8.9  MG 1.6*  --  1.9  GFRNONAA >60 >60 >60  ANIONGAP _1 Recent Labs  Lab 03/06/22 1155 03/08/22 0323 03/09/22 0440  PROT 6.7 5.7* 5.8*  ALBUMIN 3.5 2.7* 2.6*  AST _2 ALT _3 ALKPHOS 66 67 79  BILITOT 1.2 0.8 0.7   Lipids No results for input(s): "CHOL", "TRIG", "HDL", "LABVLDL", "LDLCALC", "CHOLHDL" in the last 168 hours.  Hematology Recent Labs  Lab 03/07/22 0159 03/08/22 0323 03/09/22 0440  WBC 10.6* 9.9 8.7  RBC 3.86* 3.65* 3.74*  HGB 12.1* 11.6* 11.7*  HCT 36.3* 34.5* 34.8*  MCV 94.0 94.5 93.0  MCH 31.3 31.8 31.3  MCHC 33.3 33.6 33.6  RDW 12.5 12.4 12.4  PLT 153 164 182   Thyroid No results for input(s): "TSH", "FREET4" in the last 168 hours.  BNPNo results for input(s): "BNP", "PROBNP" in the last 168 hours.  DDimer No results for input(s): "DDIMER" in the last 168 hours.   Radiology/Studies:  DG CHEST PORT 1 VIEW  Result Date: 03/07/2022 CLINICAL DATA:  Shortness of breath. EXAM: PORTABLE CHEST 1 VIEW COMPARISON:  03/06/2022 FINDINGS: Lungs are adequately inflated demonstrate mild patchy opacification over the mid to lower lungs without  significant change which may be due to atelectasis or infection. No significant effusion. Known eventration over the anterior aspect of the right hemidiaphragm unchanged. Cardiomediastinal silhouette and remainder of the exam is unchanged. IMPRESSION: Persistent patchy opacification over the mid to lower lungs which may be due to atelectasis or infection. Electronically Signed   By: Marin Olp M.D.   On: 03/07/2022 09:19   DG Chest 2 View  Result Date: 03/06/2022 CLINICAL DATA:  Suspected Sepsis EXAM: CHEST - 2 VIEW COMPARISON:  November 18, 2021 FINDINGS: The heart size and mediastinal contours are stable. Low lung volumes. Again seen is the eventration of the anterior right hemidiaphragm. Mild atelectasis at the bibasilar regions greater on the left. The visualized skeletal structures are unremarkable. IMPRESSION: Low lung volumes.  Bibasilar atelectasis greater on the left. Electronically Signed   By: Frazier Richards M.D.   On: 03/06/2022 12:40     Assessment and Plan:   ***   Risk Assessment/Risk Scores:  {Complete the following score calculators/questions to meet required metrics.  Press F2         :[id number]}   {Is the patient being seen for CHEST PAIN, UNSTABLE ANGINA, NSTEMI or STEMI?     :[id number]} {Does this patient have CHF or CHF symptoms?      :[id number]} {Does this patient have ATRIAL FIBRILLATION?:[phone]}  {Are we signing off today?:[id number]}  For questions or updates, please contact [place] Please consult [email or web address] for contact info under    Signed, Rosaria Ferries, PA-C  03/09/2022 3:19 PM

## 2022-03-09 NOTE — Progress Notes (Signed)
PROGRESS NOTE    Jackson [name]  ZOX:[id number] DOB: 05-Feb-1929 DOA: 03/06/2022 PCP: Marden Noble, MD  Chief Complaint  Patient presents with   Fever    Brief Narrative:  Jackson [name] is [place] [name] 88 y.o. male with medical history significant of DM; HTN; HLD; and chronic diastolic CHF presenting with fever. He reports that he has PNA, has had it before and it's "hard to shake."  He saw Dr. Kevan Ny Tuesday and "he sounded like I needed" [name] prescription, but he didn't give him one.  He reports Jackson [name] "light-grade" fever, occasional cough which is productive of "foam", some hoarseness.  Denies sick contacts - but his DIL is upstairs now with [name] COPD exacerbation.    Assessment & Plan:   Principal Problem:   Fever Active Problems:   Bradycardia   Sepsis due to pneumonia (HCC)   CAP (community acquired pneumonia)   Rhinovirus infection   Respiratory failure, acute (HCC)   Elevated serum creatinine   Hyponatremia   Type 2 diabetes mellitus (HCC)   Essential hypertension   Dyslipidemia   DNR (do not resuscitate)   Type 2 diabetes mellitus with hyperglycemia (HCC)   Pneumonia   Assessment and Plan: Bradycardia Concern on tele for complete heart block EKG this morning with 2nd degree AV nodal block type 1 Will c/s cardiology for assistance He's not on any av nodal blocking med  Sepsis due to pneumonia (HCC) Ruled in, fever, leukocytosis, tachypnea, hypoxia CXR with patchy opacification over mid to lower lungs, atelectasis vs infection Rhinovirus positive, given decompensation with O2 requirement, productive cough -> suspect possibility of bacteria superinfection MRSA pcr not detected, blood cx NGTDx2, sputum cx normal resp flora  Urine strep negative, legionella negative Continue abx for CAP   Respiratory failure, acute (HCC) Improving, will follow Wean as tolerated with treatment of pneumonia Wean o2 as tolerated  Hyponatremia Mild, follow  Elevated serum  creatinine Improved Unclear baseline  Dyslipidemia statin  Essential hypertension lisinopril  Type 2 diabetes mellitus (HCC) Continue home basal regimen SSI     DVT prophylaxis: lovenox Code Status: dnr Family Communication: wife at bedside Disposition:   Status is: Observation The patient will require care spanning > 2 midnights and should be moved to inpatient because: need for IV abx, weaning O2   Consultants:  none  Procedures:  none  Antimicrobials:  Anti-infectives (From admission, onward)    Start     Dose/Rate Route Frequency Ordered Stop   03/08/22 1000  vancomycin (VANCOCIN) IVPB 1000 mg/200 mL premix  Status:  Discontinued        1,000 mg 200 mL/hr over 60 Minutes Intravenous Every 24 hours 03/07/22 0903 03/07/22 1919   03/07/22 1000  cefTRIAXone (ROCEPHIN) 1 g in sodium chloride 0.9 % 100 mL IVPB        1 g 200 mL/hr over 30 Minutes Intravenous Every 24 hours 03/07/22 0822 03/12/22 0959   03/07/22 1000  azithromycin (ZITHROMAX) tablet 500 mg        500 mg Oral Daily 03/07/22 0822 03/12/22 0959   03/07/22 1000  vancomycin (VANCOREADY) IVPB 1750 mg/350 mL        1,750 mg 175 mL/hr over 120 Minutes Intravenous  Once 03/07/22 0903 03/08/22 1315   03/06/22 1330  cefTRIAXone (ROCEPHIN) 2 g in sodium chloride 0.9 % 100 mL IVPB  Status:  Discontinued        2 g 200 mL/hr over 30 Minutes Intravenous Every 24 hours 03/06/22 1315 03/06/22 1514  03/06/22 1330  azithromycin (ZITHROMAX) 500 mg in sodium chloride 0.9 % 250 mL IVPB  Status:  Discontinued        500 mg 250 mL/hr over 60 Minutes Intravenous Every 24 hours 03/06/22 1315 03/06/22 1514       Subjective: Wife concerned about his breathing No new complaints from patient, feels about same  Objective: Vitals:   03/08/22 2031 03/09/22 0521 03/09/22 0522 03/09/22 0740  BP: (!) 159/67  (!) 160/62   Pulse: 75  77   Resp:      Temp: 99 F (37.2 C)  97.6 F (36.4 C)   TempSrc: Oral  Oral   SpO2:  98%  91% 95%  Weight:  80.5 kg    Height:        Intake/Output Summary (Last 24 hours) at 03/09/2022 1511 Last data filed at 03/09/2022 1251 Gross per 24 hour  Intake 1342.67 ml  Output --  Net 1342.67 ml   Filed Weights   03/07/22 0639 03/08/22 0400 03/09/22 0521  Weight: 82.6 kg 82.1 kg 80.5 kg    Examination:  General: No acute distress. Cardiovascular: RRR Lungs: coarse breath sounds, cough - sats drop to high 80's on RA Abdomen: Soft, nontender, nondistended  Neurological: Alert and oriented 3. Moves all extremities 4 with equal strength. Cranial nerves II through XII grossly intact. Extremities: No clubbing or cyanosis. No edema  Data Reviewed: I have personally reviewed following labs and imaging studies  CBC: Recent Labs  Lab 03/06/22 1155 03/07/22 0159 03/08/22 0323 03/09/22 0440  WBC 11.0* 10.6* 9.9 8.7  NEUTROABS 8.5*  --  7.2 6.0  HGB 14.0 12.1* 11.6* 11.7*  HCT 42.1 36.3* 34.5* 34.8*  MCV 94.6 94.0 94.5 93.0  PLT 202 153 164 [id number]    Basic Metabolic Panel: Recent Labs  Lab 03/06/22 1155 03/07/22 0159 03/08/22 0323 03/08/22 1441 03/09/22 0440  NA 133* 128* 131* 131* 132*  K 4.1 3.9 3.8 4.2 4.0  CL 95* 96* 95* 96* 97*  CO2 26 23 23  21* 27  GLUCOSE 193* 188* 169* 195* 171*  BUN 13 13 14 15 13   CREATININE 1.28* 1.13 1.08 1.08 1.06  CALCIUM 9.4 8.6* 8.8* 9.2 8.9  MG  --   --  1.6*  --  1.9  PHOS  --   --  2.1*  --  3.1    GFR: Estimated Creatinine Clearance: 46.2 mL/min (by C-G formula based on SCr of 1.06 mg/dL).  Liver Function Tests: Recent Labs  Lab 03/06/22 1155 03/08/22 0323 03/09/22 0440  AST 18 20 23   ALT 15 17 23   ALKPHOS 66 67 79  BILITOT 1.2 0.8 0.7  PROT 6.7 5.7* 5.8*  ALBUMIN 3.5 2.7* 2.6*    CBG: Recent Labs  Lab 03/08/22 1147 03/08/22 1538 03/08/22 2027 03/09/22 0537 03/09/22 1153  GLUCAP 189* 194* 178* 162* 184*     Recent Results (from the past 240 hour(s))  Culture, blood (Routine x 2)     Status:  None (Preliminary result)   Collection Time: 03/06/22 11:59 AM   Specimen: BLOOD  Result Value Ref Range Status   Specimen Description BLOOD RIGHT ANTECUBITAL  Final   Special Requests   Final    BOTTLES DRAWN AEROBIC AND ANAEROBIC Blood Culture results may not be optimal due to an excessive volume of blood received in culture bottles   Culture   Final    NO GROWTH 3 DAYS Performed at [facility], [place] [address]  [place], [place] [address]    Report Status PENDING  Incomplete  Culture, blood (Routine x 2)     Status: None (Preliminary result)   Collection Time: 03/06/22 12:48 PM   Specimen: BLOOD  Result Value Ref Range Status   Specimen Description BLOOD BLOOD RIGHT ARM  Final   Special Requests   Final    BOTTLES DRAWN AEROBIC ONLY Blood Culture results may not be optimal due to an inadequate volume of blood received in culture bottles   Culture   Final    NO GROWTH 3 DAYS Performed at [facility], [place] [address] [place], [place] [address]    Report Status PENDING  Incomplete  Resp Panel by RT-PCR (Flu Khush Pasion&B, Covid) Anterior Nasal Swab     Status: None   Collection Time: 03/06/22  3:14 PM   Specimen: Anterior Nasal Swab  Result Value Ref Range Status   SARS Coronavirus 2 by RT PCR NEGATIVE NEGATIVE Final    Comment: (NOTE) SARS-CoV-2 target nucleic acids are NOT DETECTED.  The SARS-CoV-2 RNA is generally detectable in upper respiratory specimens during the acute phase of infection. The lowest concentration of SARS-CoV-2 viral copies this assay can detect is 138 copies/mL. Amer Alcindor negative result does not preclude SARS-Cov-2 infection and should not be used as the sole basis for treatment or other patient management decisions. Renzo Vincelette negative result may occur with  improper specimen collection/handling, submission of specimen other than nasopharyngeal swab, presence of viral mutation(s) within the areas targeted by this assay, and inadequate number of viral copies(<138  copies/mL). Shareena Nusz negative result must be combined with clinical observations, patient history, and epidemiological information. The expected result is Negative.  Fact Sheet for Patients:  EntrepreneurPulse.com.au  Fact Sheet for Healthcare Providers:  IncredibleEmployment.be  This test is no t yet approved or cleared by the [place] FDA and  has been authorized for detection and/or diagnosis of SARS-CoV-2 by FDA under an Emergency Use Authorization (EUA). This EUA will remain  in effect (meaning this test can be used) for the duration of the COVID-19 declaration under Section 564(b)(1) of the Act, 21 U.S.C.section 360bbb-3(b)(1), unless the authorization is terminated  or revoked sooner.       Influenza Shelise Maron by PCR NEGATIVE NEGATIVE Final   Influenza B by PCR NEGATIVE NEGATIVE Final    Comment: (NOTE) The Xpert Xpress SARS-CoV-2/FLU/RSV plus assay is intended as an aid in the diagnosis of influenza from Nasopharyngeal swab specimens and should not be used as Shaolin Armas sole basis for treatment. Nasal washings and aspirates are unacceptable for Xpert Xpress SARS-CoV-2/FLU/RSV testing.  Fact Sheet for Patients: EntrepreneurPulse.com.au  Fact Sheet for Healthcare Providers: IncredibleEmployment.be  This test is not yet approved or cleared by the [place] FDA and has been authorized for detection and/or diagnosis of SARS-CoV-2 by FDA under an Emergency Use Authorization (EUA). This EUA will remain in effect (meaning this test can be used) for the duration of the COVID-19 declaration under Section 564(b)(1) of the Act, 21 U.S.C. section 360bbb-3(b)(1), unless the authorization is terminated or revoked.  Performed at [facility], [place] [address] [place], [place] [address]   Respiratory (~20 pathogens) panel by PCR     Status: Abnormal   Collection Time: 03/06/22  3:14 PM   Specimen: Nasopharyngeal Swab;  Respiratory  Result Value Ref Range Status   Adenovirus NOT DETECTED NOT DETECTED Final   Coronavirus 229E NOT DETECTED NOT DETECTED Final    Comment: (NOTE) The Coronavirus on the  Respiratory Panel, DOES NOT test for the novel  Coronavirus (2019 nCoV)    Coronavirus HKU1 NOT DETECTED NOT DETECTED Final   Coronavirus NL63 NOT DETECTED NOT DETECTED Final   Coronavirus OC43 NOT DETECTED NOT DETECTED Final   Metapneumovirus NOT DETECTED NOT DETECTED Final   Rhinovirus / Enterovirus DETECTED (Cassiopeia Florentino) NOT DETECTED Final   Influenza Taren Toops NOT DETECTED NOT DETECTED Final   Influenza B NOT DETECTED NOT DETECTED Final   Parainfluenza Virus 1 NOT DETECTED NOT DETECTED Final   Parainfluenza Virus 2 NOT DETECTED NOT DETECTED Final   Parainfluenza Virus 3 NOT DETECTED NOT DETECTED Final   Parainfluenza Virus 4 NOT DETECTED NOT DETECTED Final   Respiratory Syncytial Virus NOT DETECTED NOT DETECTED Final   Bordetella pertussis NOT DETECTED NOT DETECTED Final   Bordetella Parapertussis NOT DETECTED NOT DETECTED Final   Chlamydophila pneumoniae NOT DETECTED NOT DETECTED Final   Mycoplasma pneumoniae NOT DETECTED NOT DETECTED Final    Comment: Performed at [facility], [address] [place], [place] [address]  Expectorated Sputum Assessment w Gram Stain, Rflx to Resp Cult     Status: None   Collection Time: 03/07/22  8:42 AM   Specimen: Expectorated Sputum  Result Value Ref Range Status   Specimen Description EXPECTORATED SPUTUM  Final   Special Requests NONE  Final   Sputum evaluation   Final    THIS SPECIMEN IS ACCEPTABLE FOR SPUTUM CULTURE Performed at [facility], [address] [place], [place] [address]    Report Status 03/07/2022 FINAL  Final  Culture, Respiratory w Gram Stain     Status: None   Collection Time: 03/07/22  8:42 AM  Result Value Ref Range Status   Specimen Description EXPECTORATED SPUTUM  Final   Special Requests NONE Reflexed from [id number]  Final   Gram Stain    Final    RARE WBC PRESENT,BOTH PMN AND MONONUCLEAR RARE GRAM POSITIVE COCCI IN PAIRS RARE GRAM VARIABLE ROD    Culture   Final    RARE Consistent with normal respiratory flora. Performed at [facility], [address] [place], [place] [address]    Report Status 03/09/2022 FINAL  Final  MRSA Next Gen by PCR, Nasal     Status: None   Collection Time: 03/07/22  8:52 AM   Specimen: Nasal Mucosa; Nasal Swab  Result Value Ref Range Status   MRSA by PCR Next Gen NOT DETECTED NOT DETECTED Final    Comment: (NOTE) The GeneXpert MRSA Assay (FDA approved for NASAL specimens only), is one component of Mozella Rexrode comprehensive MRSA colonization surveillance program. It is not intended to diagnose MRSA infection nor to guide or monitor treatment for MRSA infections. Test performance is not FDA approved in patients less than 66 years old. Performed at [facility], [address], [place], [place] [address]          Radiology Studies: No results found.      Scheduled Meds:  aspirin EC  81 mg Oral Daily   azithromycin  500 mg Oral Daily   docusate sodium  100 mg Oral BID   enoxaparin (LOVENOX) injection  40 mg Subcutaneous Q24H   fluticasone  2 spray Each Nare BID   gabapentin  300 mg Oral QHS   insulin aspart  0-9 Units Subcutaneous TID WC   insulin glargine-yfgn  15 Units Subcutaneous QHS   levalbuterol  0.63 mg Nebulization TID   lisinopril  5 mg Oral Daily  pantoprazole  40 mg Oral QHS   rosuvastatin  10 mg Oral QHS   sodium chloride flush  3 mL Intravenous Q12H   Continuous Infusions:  sodium chloride 50 mL/hr at 03/09/22 0626   cefTRIAXone (ROCEPHIN)  IV 1 g (03/09/22 1258)     LOS: 2 days    Time spent: over 30 min    Fayrene Helper, MD [facility]   To contact the attending provider between 7A-7P or the covering provider during after hours 7P-7A, please log into the web site [email or web address] and access using universal [place] password for  that web site. If you do not have the password, please call the hospital operator.  03/09/2022, 3:11 PM

## 2022-03-09 NOTE — Progress Notes (Signed)
Cardiology Consultation:   Patient ID: Jackson [name]; [id number]; 09/18/1929   Admit date: 03/06/2022 Date of Consult: 03/09/2022  Primary Care Provider: Josetta Huddle, MD Primary Cardiologist: Virl Axe, MD  Primary Electrophysiologist:  Virl Axe, MD    Patient Profile:   Jackson [name] is a 88 y.o. male with a hx of transient CHB  who is being seen today for the evaluation of bradycardia at the request of Dr. Florene Glen.  History of Present Illness:   Jackson [name] has been seen in the past by Dr. Caryl Comes but is not actively followed.  He has had first degree AV block.  He has also had transient CHB during acute illness and was not thought to need pacing.  He has AS but has not been seen by our practice since 2020.  He carries a diagnosis of chronic diastolic HF.    He was admitted with respiratory failure and sepsis related to pneumonia.  On tele he is found to have sinus bradycardia with first-degree AV block, Mobitz type I and occasional complete heart block with junctional escape.  The patient lives at home with his wife of 58 years.  He usually does very well.  He has some as needed oxygen that he uses rarely.  He has to walk up quite an incline down to some water that he has on his property.  His wife verifies that he did that up to 2 weeks ago and he has to stop and take some breaks he is not gasping for breath.  He has not had any other resting complaints such as PND or orthopnea.  He does not feel palpitations and he never has had presyncope or syncope.  They check oxygen saturations in the mid 90s.  They checked his heart rate and this often in the 40s to 50s but he is not having any symptoms.  He came to the hospital because she notices that he gets a little confused and starts coughing and she knows that he has a respiratory infection starting.  He is not having any chest pressure, neck or arm discomfort.  He said no weight gain or edema.      Past Medical History:  Diagnosis  Date   Aortic stenosis    Echo 3/18: mild aortic stenosis (mean 12/peak 22 // Echo 3/19: mild AS (mean 12, peak 20) // Echo 02/2019: EF 03-83, grade 1 diastolic dysfunction, mild MAC, mild aortic stenosis (mean gradient 9), mild dilation of the ascending aorta (38 mm)    Atherosclerosis of aorta ([place]) 12/16/2016   CXR 6/16: IMPRESSION: 1. Resolved left basilar airspace opacities. 2. Hazy nodularity in both lungs compatible with the patient's known pleural plaques. 3. Right anterior hemidiaphragmatic eventration. 4. Thoracic spondylosis. 5. Atherosclerotic calcification of the aortic arch.   Bradycardia    Low HR (30s) noted during sleep during admx for sepsis in 2016; Mobitz 1, 2:1 block >> no indication for pacer; management of OSA recommended // transient CHB noted on ECG during admit in 10/2018    Diabetes mellitus without complication (HCC)    Diabetic neuropathy (HCC)    DJD (degenerative joint disease)    knee, hip   Echocardiogram    Echo 6/16: mod LVH, EF 65-70, vigorous LVF, no RWMA, mild MR, mild LAE // Echo 3/18: EF 60-65, Gr 1 DD, mild AS, PASP 47 // Echo 3/19: EF 60-65, Gr 1 DD, mild AS (mean 12) // Echo 7/19: EF 55-60, Gr 1 DD, PASP 36  Elevated troponin 06/28/2020   GERD (gastroesophageal reflux disease)    Heart murmur    HLD (hyperlipidemia)    Hypertension    Hyperthyroidism    Lexiscan Myoview 06/2018   Normal perfusion, EF 81; Low Risk   Mild diastolic dysfunction 75/64/3329   Noted on Echocardiogram; Patient does not require diuretic Rx   Phlebitis of left leg    Pneumonia 02/2015   admx with sepsis     Past Surgical History:  Procedure Laterality Date   CATARACT EXTRACTION Right    CIRCUMCISION     FRACTURE SURGERY     TOTAL HIP ARTHROPLASTY Left 09/24/2017   Procedure: LEFT TOTAL HIP ARTHROPLASTY ANTERIOR APPROACH;  Surgeon: Mcarthur Rossetti, MD;  Location: [facility];  Service: Orthopedics;  Laterality: Left;     Home Medications:  Prior to Admission  medications   Medication Sig Start Date End Date Taking? Authorizing Provider  albuterol (PROVENTIL) (2.5 MG/3ML) 0.083% nebulizer solution Take 2.5 mg by nebulization every 6 (six) hours as needed for wheezing or shortness of breath.   Yes [provider]  aspirin EC 81 MG tablet Take 81 mg by mouth daily.   Yes [provider]  Calcium Carbonate-Vitamin D (CALCIUM 600+D PO) Take 1 tablet by mouth daily.   Yes [provider]  docusate sodium (COLACE) 100 MG capsule Take 100 mg by mouth at bedtime.   Yes [provider]  fluticasone (FLONASE) 50 MCG/ACT nasal spray Place 2 sprays into both nostrils 2 (two) times daily. 11/20/18  Yes Arrien, Jimmy Picket, MD  gabapentin (NEURONTIN) 100 MG capsule Take 300 mg by mouth at bedtime.    Yes [provider]  insulin aspart (NOVOLOG FLEXPEN) 100 UNIT/ML FlexPen Before each meal 3 times a day, 140-199 - 2 units, 200-250 - 4 units, 251-299 - 6 units,  300-349 - 8 units,  350 or above 10 units. Patient taking differently: Inject 2-10 Units into the skin See admin instructions. Per sliding scale before each meal 3 times a day, 140-199 = 2 units, 200-250 =4 units, 251-299 =6 units,  300-349 = 8 units,  350 or above 10 units. 02/28/15  Yes Florencia Reasons, MD  Insulin Glargine (LANTUS) 100 UNIT/ML Solostar Pen Inject 15 Units into the skin daily at 10 pm.   Yes [provider]  lisinopril (PRINIVIL,ZESTRIL) 5 MG tablet Take 1 tablet (5 mg total) by mouth daily. 12/26/18  Yes Patwardhan, Manish J, MD  omeprazole (PRILOSEC) 20 MG capsule Take 20 mg by mouth daily. 10/20/16  Yes [provider]  rosuvastatin (CRESTOR) 10 MG tablet TAKE 1 TABLET AT BEDTIME Patient taking differently: Take 10 mg by mouth at bedtime. 03/26/20  Yes Adrian Prows, MD  sodium chloride (OCEAN) 0.65 % SOLN nasal spray Place 1 spray into both nostrils as needed for congestion. 11/20/18  Yes Arrien, Jimmy Picket, MD  tetrahydrozoline-zinc  (VISINE-AC) 0.05-0.25 % ophthalmic solution Place 2 drops into both eyes 3 (three) times daily as needed (dry eyes).   Yes [provider]    Inpatient Medications: Scheduled Meds:  aspirin EC  81 mg Oral Daily   azithromycin  500 mg Oral Daily   docusate sodium  100 mg Oral BID   enoxaparin (LOVENOX) injection  40 mg Subcutaneous Q24H   fluticasone  2 spray Each Nare BID   gabapentin  300 mg Oral QHS   insulin aspart  0-9 Units Subcutaneous TID WC   insulin glargine-yfgn  15 Units Subcutaneous QHS  levalbuterol  0.63 mg Nebulization TID   lisinopril  5 mg Oral Daily   pantoprazole  40 mg Oral QHS   rosuvastatin  10 mg Oral QHS   sodium chloride flush  3 mL Intravenous Q12H   Continuous Infusions:  sodium chloride 50 mL/hr at 03/09/22 0626   cefTRIAXone (ROCEPHIN)  IV 1 g (03/09/22 1258)   PRN Meds: acetaminophen **OR** acetaminophen, albuterol, bisacodyl, guaiFENesin, hydrALAZINE, morphine injection, naphazoline-glycerin, ondansetron **OR** ondansetron (ZOFRAN) IV, oxyCODONE, polyethylene glycol  Allergies:    Allergies  Allergen Reactions   Actos [Pioglitazone] Swelling   Formaldehyde Other (See Comments)    Flu-like symptoms   Lipitor [Atorvastatin Calcium] Other (See Comments)    unknown   Metformin And Related Other (See Comments)    unknown    Social History:   Social History   Socioeconomic History   Marital status: Married    Spouse name: Not on file   Number of children: Not on file   Years of education: Not on file   Highest education level: Not on file  Occupational History   Occupation: retired  Tobacco Use   Smoking status: Former    Types: Cigarettes    Quit date: 1960    Years since quitting: 63.5   Smokeless tobacco: Never  Vaping Use   Vaping Use: Never used  Substance and Sexual Activity   Alcohol use: No   Drug use: No   Sexual activity: Not on file  Other Topics Concern   Not on file  Social History Narrative   Not on file    Social Determinants of Health   Financial Resource Strain: Not on file  Food Insecurity: Not on file  Transportation Needs: Not on file  Physical Activity: Not on file  Stress: Not on file  Social Connections: Not on file  Intimate Partner Violence: Not on file    Family History:    Family History  Problem Relation Age of Onset   Diabetes Mother    Diabetes Sister    Diabetes Brother    Heart attack Neg Hx    Stroke Neg Hx    Hypertension Neg Hx      ROS:  Please see the history of present illness.  ROS  All other ROS reviewed and negative.     Physical Exam/Data:   Vitals:   03/08/22 2031 03/09/22 0521 03/09/22 0522 03/09/22 0740  BP: (!) 159/67  (!) 160/62   Pulse: 75  77   Resp:      Temp: 99 F (37.2 C)  97.6 F (36.4 C)   TempSrc: Oral  Oral   SpO2: 98%  91% 95%  Weight:  80.5 kg    Height:        Intake/Output Summary (Last 24 hours) at 03/09/2022 1546 Last data filed at 03/09/2022 1251 Gross per 24 hour  Intake 1342.67 ml  Output --  Net 1342.67 ml   Filed Weights   03/07/22 0639 03/08/22 0400 03/09/22 0521  Weight: 82.6 kg 82.1 kg 80.5 kg   Body mass index is 27.8 kg/m.  GENERAL: Well-appearing appearing for his age 21:   Pupils equal round and reactive, fundi not visualized, oral mucosa unremarkable NECK:  No  jugular venous distention, waveform within normal limits, carotid upstroke brisk and symmetric, no bruits, no thyromegaly LYMPHATICS:  No cervical, inguinal adenopathy LUNGS:   Clear to auscultation bilaterally BACK:  No CVA tenderness CHEST: Mildly decreased breath sounds at the bases, no crackles HEART:  PMI not displaced or sustained,S1 and S2 within normal limits, no S3, no S4, no clicks, no rubs, 2 out of 6 apical systolic murmur radiating slightly up.  Outflow tract, no diastolic murmurs ABD:  Flat, positive bowel sounds normal in frequency in pitch, no bruits, no rebound, no guarding, no midline pulsatile mass, no  hepatomegaly, no splenomegaly EXT:  2 plus pulses throughout, trace bilateral ankle edema, no cyanosis no clubbing SKIN:  No rashes no nodules NEURO:   Cranial nerves II through XII grossly intact, motor grossly intact throughout PSYCH: Mildly confused but overall cognitively intact, oriented to person place and time   EKG:  The EKG was personally reviewed and demonstrates:  NSR with first degree AV block and Mobitz 1 with poor anterior R wave progression.  HR is 51.    Telemetry:  Telemetry was personally reviewed and demonstrates: As above  Relevant CV Studies: None  Laboratory Data:  Chemistry Recent Labs  Lab 03/08/22 0323 03/08/22 1441 03/09/22 0440  NA 131* 131* 132*  K 3.8 4.2 4.0  CL 95* 96* 97*  CO2 23 21* 27  GLUCOSE 169* 195* 171*  BUN _0 CREATININE 1.08 1.08 1.06  CALCIUM 8.8* 9.2 8.9  GFRNONAA >60 >60 >60  ANIONGAP _1 Recent Labs  Lab 03/06/22 1155 03/08/22 0323 03/09/22 0440  PROT 6.7 5.7* 5.8*  ALBUMIN 3.5 2.7* 2.6*  AST _2 ALT _3 ALKPHOS 66 67 79  BILITOT 1.2 0.8 0.7   Hematology Recent Labs  Lab 03/07/22 0159 03/08/22 0323 03/09/22 0440  WBC 10.6* 9.9 8.7  RBC 3.86* 3.65* 3.74*  HGB 12.1* 11.6* 11.7*  HCT 36.3* 34.5* 34.8*  MCV 94.0 94.5 93.0  MCH 31.3 31.8 31.3  MCHC 33.3 33.6 33.6  RDW 12.5 12.4 12.4  PLT 153 164 182   Cardiac EnzymesNo results for input(s): "TROPONINI" in the last 168 hours. No results for input(s): "TROPIPOC" in the last 168 hours.  BNPNo results for input(s): "BNP", "PROBNP" in the last 168 hours.  DDimer No results for input(s): "DDIMER" in the last 168 hours.  Radiology/Studies:  DG CHEST PORT 1 VIEW  Result Date: 03/07/2022 CLINICAL DATA:  Shortness of breath. EXAM: PORTABLE CHEST 1 VIEW COMPARISON:  03/06/2022 FINDINGS: Lungs are adequately inflated demonstrate mild patchy opacification over the mid to lower lungs without significant change which may be due to atelectasis or  infection. No significant effusion. Known eventration over the anterior aspect of the right hemidiaphragm unchanged. Cardiomediastinal silhouette and remainder of the exam is unchanged. IMPRESSION: Persistent patchy opacification over the mid to lower lungs which may be due to atelectasis or infection. Electronically Signed   By: Marin Olp M.D.   On: 03/07/2022 09:19   DG Chest 2 View  Result Date: 03/06/2022 CLINICAL DATA:  Suspected Sepsis EXAM: CHEST - 2 VIEW COMPARISON:  November 18, 2021 FINDINGS: The heart size and mediastinal contours are stable. Low lung volumes. Again seen is the eventration of the anterior right hemidiaphragm. Mild atelectasis at the bibasilar regions greater on the left. The visualized skeletal structures are unremarkable. IMPRESSION: Low lung volumes.  Bibasilar atelectasis greater on the left. Electronically Signed   By: Frazier Richards M.D.   On: 03/06/2022 12:40    Assessment and Plan:   Transient CHB: The patient does have some complete heart block but he has an excellent blood pressure.  He said no syncope or presyncope no other symptoms.  He has been managed conservatively previously.  I talked to the patient and his wife about this.  They would continue to agree with conservative management in the absence of symptoms.  No further work-up is suggested.  I agree with avoiding AV nodal blocking agents.  Aortic stenosis:  He had a very low gradient with no significant stenosis in 2021.  No further work-up.  Aortic atherosclerosis:  Continue with risk reduction.    DM:  Continue therapy per primary MD.   Blood sugars have been elevated this admission but I do not see a recent A1c.     Dyslipidemia:   I do not see a recent lipid profile.  I will defer management to his primary provider.  He can continue on his low-dose statin.  HTN: Blood pressures are well controlled.  I will allow a little permissive hypertension.  No change in therapy.  Hyponatremia:  Follow per  PCP.     For questions or updates, please contact [place] Please consult [email or web address] for contact info under Cardiology/STEMI.   Signed, Minus Breeding, MD  03/09/2022 3:46 PM

## 2022-03-09 NOTE — Progress Notes (Signed)
Physical Therapy Treatment Patient Details Name: Jackson [name] MRN: [id number] DOB: 04/30/1929 Today's Date: 03/09/2022   History of Present Illness Pt adm 6/16 with PNA. PMH - DM, HTN, CHF, neuropathy, djd, lt thr    PT Comments    Pt was seen for progressing movement, but had to wear O2 for gait.  Pt was in room on room air per nsg trial, but returned to line after PT determined his sats are too low for gait on room air. Reviewed light manually resisted ex, with pt effort being good.  Follow up with him to work on longer gait trips with O2 as ordered, and encourage him to keep getting OOB to chair for his lung congestion and clearance of same.     Recommendations for follow up therapy are one component of a multi-disciplinary discharge planning process, led by the attending physician.  Recommendations may be updated based on patient status, additional functional criteria and insurance authorization.  Follow Up Recommendations  Home health PT     Assistance Recommended at Discharge Intermittent Supervision/Assistance  Patient can return home with the following A little help with walking and/or transfers;Help with stairs or ramp for entrance;A little help with bathing/dressing/bathroom   Equipment Recommendations  None recommended by PT    Recommendations for Other Services       Precautions / Restrictions Precautions Precautions: Fall Precaution Comments: monitor HR and sats, requires O2 today Restrictions Weight Bearing Restrictions: No     Mobility  Bed Mobility               General bed mobility comments: in chair when PT arrives    Transfers Overall transfer level: Needs assistance Equipment used: Rolling walker (2 wheels) Transfers: Sit to/from Stand Sit to Stand: Min guard           General transfer comment: reminders for hand placement and set up to initiate power up    Ambulation/Gait Ambulation/Gait assistance: Min assist Gait Distance (Feet):  45 Feet Assistive device: Rolling walker (2 wheels), 1 person hand held assist Gait Pattern/deviations: Step-through pattern, Decreased stride length, Decreased weight shift to left, Wide base of support Gait velocity: reduced Gait velocity interpretation: <1.31 ft/sec, indicative of household ambulator Pre-gait activities: standing balance correction General Gait Details: has ROM losses on L hip from prev THA, tends to give in to the stiffness   Stairs             Wheelchair Mobility    Modified Rankin (Stroke Patients Only)       Balance Overall balance assessment: Needs assistance Sitting-balance support: Feet supported Sitting balance-Leahy Scale: Fair     Standing balance support: Bilateral upper extremity supported, During functional activity Standing balance-Leahy Scale: Poor                              Cognition Arousal/Alertness: Awake/alert Behavior During Therapy: WFL for tasks assessed/performed Overall Cognitive Status: Impaired/Different from baseline Area of Impairment: Following commands, Problem solving, Attention                   Current Attention Level: Selective, Alternating   Following Commands: Follows one step commands with increased time Safety/Judgement: Decreased awareness of safety, Decreased awareness of deficits   Problem Solving: Requires verbal cues, Requires tactile cues General Comments: impulsive to stand and requires repeated corrections of mobility due to his hearing and fast moving  Exercises General Exercises - Lower Extremity Ankle Circles/Pumps: AROM, AAROM, 5 reps Long Arc Quad: Strengthening, 10 reps Heel Slides: Strengthening, 10 reps Hip ABduction/ADduction: AROM, AAROM, 10 reps    General Comments General comments (skin integrity, edema, etc.): pt is up to walk with help on O2 after initially trying with room air.  Pt cannot maintain sats, requires 3L to sustain any gait today       Pertinent Vitals/Pain Pain Assessment Pain Assessment: No/denies pain    Home Living                          Prior Function            PT Goals (current goals can now be found in the care plan section) Acute Rehab PT Goals Patient Stated Goal: not stated Progress towards PT goals: Progressing toward goals    Frequency    Min 3X/week      PT Plan Current plan remains appropriate    Co-evaluation              AM-PAC PT "6 Clicks" Mobility   Outcome Measure  Help needed turning from your back to your side while in a flat bed without using bedrails?: A Little Help needed moving from lying on your back to sitting on the side of a flat bed without using bedrails?: A Little Help needed moving to and from a bed to a chair (including a wheelchair)?: A Little Help needed standing up from a chair using your arms (e.g., wheelchair or bedside chair)?: A Little Help needed to walk in hospital room?: A Little Help needed climbing 3-5 steps with a railing? : A Little 6 Click Score: 18    End of Session Equipment Utilized During Treatment: Gait belt;Oxygen Activity Tolerance: Patient limited by fatigue;Treatment limited secondary to medical complications (Comment) Patient left: in chair;with call bell/phone within reach;with chair alarm set;with family/visitor present Nurse Communication: Mobility status PT Visit Diagnosis: Unsteadiness on feet (R26.81);Muscle weakness (generalized) (M62.81);Dizziness and giddiness (R42)     Time: 6433-2951 PT Time Calculation (min) (ACUTE ONLY): 32 min  Charges:  $Gait Training: 8-22 mins $Therapeutic Exercise: 8-22 mins       Ivar Drape 03/09/2022, 3:39 PM  Samul Dada, PT PhD Acute Rehab Dept. Number: [facility] [id number] and [facility] [phone]

## 2022-03-09 NOTE — Progress Notes (Signed)
Occupational Therapy Treatment Patient Details Name: Jackson [name] MRN: [id number] DOB: [birth year] Today's Date: 03/09/2022   History of present illness Pt adm 6/16 with PNA. PMH - DM, HTN, CHF, neuropathy, djd, lt thr   OT comments  Patient received in supine and asking to use BSC. Patient was min guard to get to EOB and transfer to [facility] with RW. Patient performed bathing and dressing tasks while on BSC.  Patient stood from [facility] and was mod assist for toilet hygiene. Patient completed treatment seated in recliner. Patient making good progress and will continue to be followed by acute OT.    Recommendations for follow up therapy are one component of a multi-disciplinary discharge planning process, led by the attending physician.  Recommendations may be updated based on patient status, additional functional criteria and insurance authorization.    Follow Up Recommendations  Home health OT    Assistance Recommended at Discharge Set up Supervision/Assistance  Patient can return home with the following  A little help with walking and/or transfers;A little help with bathing/dressing/bathroom;Assistance with cooking/housework;Assist for transportation;Help with stairs or ramp for entrance   Equipment Recommendations  BSC/3in1    Recommendations for Other Services      Precautions / Restrictions Precautions Precautions: Fall Restrictions Weight Bearing Restrictions: No       Mobility Bed Mobility Overal bed mobility: Needs Assistance Bed Mobility: Supine to Sit     Supine to sit: Min guard     General bed mobility comments: min guard to guide trunk    Transfers Overall transfer level: Needs assistance Equipment used: Rolling walker (2 wheels) Transfers: Sit to/from Stand Sit to Stand: Min guard           General transfer comment: verbal cues to lean forward and hand placement     Balance Overall balance assessment: Needs assistance Sitting-balance support: No upper  extremity supported, Feet supported Sitting balance-Leahy Scale: Fair     Standing balance support: Bilateral upper extremity supported, During functional activity, Reliant on assistive device for balance Standing balance-Leahy Scale: Poor Standing balance comment: reliant on UE support able to use one extremity to assist with toilet hygiene                           ADL either performed or assessed with clinical judgement   ADL Overall ADL's : Needs assistance/impaired     Grooming: Wash/dry hands;Wash/dry face;Sitting;Set up   Upper Body Bathing: Set up;Sitting Upper Body Bathing Details (indicate cue type and reason): on BSC Lower Body Bathing: Minimal assistance;Cueing for safety;Cueing for sequencing;Sitting/lateral leans;Sit to/from stand Lower Body Bathing Details (indicate cue type and reason): performed seated and standing from [facility] Upper Body Dressing : Set up;Sitting Upper Body Dressing Details (indicate cue type and reason): changed gown Lower Body Dressing: Moderate assistance Lower Body Dressing Details (indicate cue type and reason): able to donn right sock but uanble to donn left Toilet Transfer: Min guard;Ambulation;Rolling walker (2 wheels);BSC/3in1 Toilet Transfer Details (indicate cue type and reason): transferred from EOB to [facility] Toileting- Clothing Manipulation and Hygiene: Moderate assistance;Sitting/lateral lean;Sit to/from stand;Cueing for safety Toileting - Clothing Manipulation Details (indicate cue type and reason): for toilet hygiene while standing       General ADL Comments: bathing and dressing performed on [facility]    Extremity/Trunk Assessment              Vision       Perception     Praxis  Cognition Arousal/Alertness: Awake/alert Behavior During Therapy: WFL for tasks assessed/performed Overall Cognitive Status: Impaired/Different from baseline Area of Impairment: Following commands, Problem solving, Safety/judgement,  Attention                   Current Attention Level: Sustained   Following Commands: Follows one step commands with increased time Safety/Judgement: Decreased awareness of safety, Decreased awareness of deficits   Problem Solving: Slow processing, Requires tactile cues, Requires verbal cues General Comments: required frequent cues for safety, often attempted to stand without assistance        Exercises      Shoulder Instructions       General Comments      Pertinent Vitals/ Pain       Pain Assessment Pain Assessment: No/denies pain  Home Living                                          Prior Functioning/Environment              Frequency  Min 2X/week        Progress Toward Goals  OT Goals(current goals can now be found in the care plan section)  Progress towards OT goals: Progressing toward goals  Acute Rehab OT Goals Patient Stated Goal: go home OT Goal Formulation: With patient Time For Goal Achievement: 03/21/22 Potential to Achieve Goals: Good ADL Goals Additional ADL Goal #1: Pt will increase to minguardA for OOB ADL in order to increase independence. Additional ADL Goal #2: Pt will state 3 energy conservation techniques to increase endurance.  Plan Discharge plan remains appropriate    Co-evaluation                 AM-PAC OT "6 Clicks" Daily Activity     Outcome Measure   Help from another person eating meals?: None Help from another person taking care of personal grooming?: A Little Help from another person toileting, which includes using toliet, bedpan, or urinal?: A Little Help from another person bathing (including washing, rinsing, drying)?: A Little Help from another person to put on and taking off regular upper body clothing?: A Little Help from another person to put on and taking off regular lower body clothing?: A Little 6 Click Score: 19    End of Session Equipment Utilized During Treatment: Gait  belt;Rolling walker (2 wheels)  OT Visit Diagnosis: Unsteadiness on feet (R26.81);Muscle weakness (generalized) (M62.81)   Activity Tolerance Patient tolerated treatment well   Patient Left in chair;with call bell/phone within reach;with family/visitor present   Nurse Communication Mobility status        Time: 7017-7939 OT Time Calculation (min): 33 min  Charges: OT General Charges $OT Visit: 1 Visit OT Treatments $Self Care/Home Management : 23-37 mins  Alfonse Flavors, OTA Acute Rehabilitation Services  Office [phone]   Dewain Penning 03/09/2022, 1:21 PM

## 2022-03-09 NOTE — Progress Notes (Signed)
TRH night cross cover note:   I was notified by RN that telemetry around 1905 had indicated third-degree heart block, with corresponding EKG showing second-degree AV block.   In reviewing patient's additional vital signs, is afebrile, with heart rates over the last 24 hours noted to be in the mid 50s to mid 70s, most recent heart rates in the high 60s; blood pressure stable, most recently 159/67 respiratory rate 17, and oxygen saturation stable at 98% on 2 L nasal cannula, consistent with degree of supplemental oxygen demand throughout the day.  It does not appear that he is on a beta-blocker or any other AV nodal blocking agents.  He has orders for as needed oxycodone and as needed morphine, but per my review of [facility], he has not been receiving any doses of either of these opioids.  No new symptoms at this time, no chest pain.  Per review of corresponding EKG, it appears to show sinus rhythm with heart rate 67 with progressive, nonfixed PR prolongation before dropping QRS, which appears consistent with 2nd degree AV block, mobitz type 1.   Will continue to monitor on telemetry, and I have ordered a repeat EKG for the morning to continue to trend.    Newton Pigg, DO Hospitalist

## 2022-03-09 NOTE — TOC Progression Note (Signed)
Transition of Care [facility]) - Progression Note    Patient Details  Name: Jackson [name] MRN: [id number] Date of Birth: 02-04-1929  Transition of Care [facility]) CM/SW Contact  Leone Haven, RN Phone Number: 03/09/2022, 4:52 PM  Clinical Narrative:     from home, Sepsis, Pna, Rhinorvirus, IV abx, EKG done, TOC will continue to follow for dc needs.         Expected Discharge Plan and Services                                                 Social Determinants of Health (SDOH) Interventions    Readmission Risk Interventions     No data to display

## 2022-03-09 NOTE — Progress Notes (Signed)
EKG was done again and placed in chart. MD Lowell Guitar was notified of completion of 0800 EKG.   At 0817   Spoke with Darral Dash RN of Quality and Infectious Disease and was informed the respiratory panel done on 03/06/22 resulted positive for rhinovirus and enterovirus. Patient was already on droplet precautions and contact precautions were added this morning.

## 2022-03-10 ENCOUNTER — Inpatient Hospital Stay (HOSPITAL_COMMUNITY): Payer: Medicare (Managed Care)

## 2022-03-10 DIAGNOSIS — R509 Fever, unspecified: Secondary | ICD-10-CM | POA: Diagnosis not present

## 2022-03-10 LAB — COMPREHENSIVE METABOLIC PANEL
ALT: 29 U/L (ref 0–44)
AST: 26 U/L (ref 15–41)
Albumin: 2.6 g/dL — ABNORMAL LOW (ref 3.5–5.0)
Alkaline Phosphatase: 95 U/L (ref 38–126)
Anion gap: 8 (ref 5–15)
BUN: 11 mg/dL (ref 8–23)
CO2: 28 mmol/L (ref 22–32)
Calcium: 9.1 mg/dL (ref 8.9–10.3)
Chloride: 99 mmol/L (ref 98–111)
Creatinine, Ser: 1.01 mg/dL (ref 0.61–1.24)
GFR, Estimated: >60 mL/min (ref >60)
Glucose, Bld: 177 mg/dL — ABNORMAL HIGH (ref 70–99)
Potassium: 3.5 mmol/L (ref 3.5–5.1)
Sodium: 135 mmol/L (ref 135–145)
Total Bilirubin: 0.5 mg/dL (ref 0.3–1.2)
Total Protein: 5.9 g/dL — ABNORMAL LOW (ref 6.5–8.1)

## 2022-03-10 LAB — GLUCOSE, CAPILLARY
Glucose-Capillary: 133 mg/dL — ABNORMAL HIGH (ref 70–99)
Glucose-Capillary: 254 mg/dL — ABNORMAL HIGH (ref 70–99)
Glucose-Capillary: 256 mg/dL — ABNORMAL HIGH (ref 70–99)
Glucose-Capillary: 151 mg/dL — ABNORMAL HIGH (ref 70–99)

## 2022-03-10 LAB — CBC WITH DIFFERENTIAL/PLATELET
Abs Immature Granulocytes: 0.03 10*3/uL (ref 0.00–0.07)
Basophils Absolute: 0 10*3/uL (ref 0.0–0.1)
Basophils Relative: 0 %
Eosinophils Absolute: 0.2 10*3/uL (ref 0.0–0.5)
Eosinophils Relative: 3 %
HCT: 34.6 % — ABNORMAL LOW (ref 39.0–52.0)
Hemoglobin: 11.8 g/dL — ABNORMAL LOW (ref 13.0–17.0)
Immature Granulocytes: 0 %
Lymphocytes Relative: 14 %
Lymphs Abs: 1 10*3/uL (ref 0.7–4.0)
MCH: 31.7 pg (ref 26.0–34.0)
MCHC: 34.1 g/dL (ref 30.0–36.0)
MCV: 93 fL (ref 80.0–100.0)
Monocytes Absolute: 1.1 10*3/uL — ABNORMAL HIGH (ref 0.1–1.0)
Monocytes Relative: 16 %
Neutro Abs: 4.6 10*3/uL (ref 1.7–7.7)
Neutrophils Relative %: 67 %
Platelets: 206 10*3/uL (ref 150–400)
RBC: 3.72 MIL/uL — ABNORMAL LOW (ref 4.22–5.81)
RDW: 12.3 % (ref 11.5–15.5)
WBC: 7 10*3/uL (ref 4.0–10.5)
nRBC: 0 % (ref 0.0–0.2)

## 2022-03-10 MED ORDER — HYDROXYZINE HCL 25 MG PO TABS
25.0000 mg | ORAL_TABLET | Freq: Once | ORAL | Status: AC | PRN
  Administered 2022-03-10: 25 mg via ORAL
  Filled 2022-03-10: qty 1

## 2022-03-10 NOTE — Progress Notes (Signed)
    Tele reviewed.  No new arrhythmias.  No new suggestions.  I will follow tele while he is here.

## 2022-03-10 NOTE — Care Management Important Message (Signed)
Important Message  Patient Details  Name: Jackson [name] MRN: [id number] Date of Birth: 1929/03/22   Medicare Important Message Given:  Yes     Renie Ora 03/10/2022, 9:01 AM

## 2022-03-10 NOTE — Progress Notes (Signed)
Patient only ate a small pancake for breakfast and did not eat lunch. For lunch he only ate 3 to 4 bites of baked chicken and 3 to 4 bites of broccoli.  Pt blood glucose increased into the 200 range today. Lunch was 254 and prior to dinner, his blood glucose is documented as 256 mg/dl. Pt denies any pain.   Pt is asleep at 1630 with 2 liters of oxygen. He sleeps with is mouth open. Pt still has a strong productive cough and sputum is thick, yellow with some bloody areas. Pt also blows his nose as well as coughs up sputum. Respiratory therapist provided chest PT.  Pt is now picking at his gown and sheet more and is more confused than the previous days I have provided care to him. Today is the sixth day in a row I have provided care to the patient.  Pt ambulated with [facility] and she determined pt needs oxygen when ambulating. Pt came from home walked into the room and was on room air at time of admission.  He is alert and can tell me his name, date of birth and that he and his son are planning to go to an antique car show.

## 2022-03-10 NOTE — Progress Notes (Addendum)
PROGRESS NOTE    Jackson [name]  [id number] DOB: 1928-12-16 DOA: 03/06/2022 PCP: Jackson Huddle, MD  Chief Complaint  Patient presents with   Fever    Brief Narrative:  Jackson [name] is Jackson [name] 88 y.o. male with medical history significant of DM; HTN; HLD; and chronic diastolic CHF presenting with fever. He reports that he has PNA, has had it before and it's "hard to shake."  He saw Dr. Inda [name] Tuesday and "he sounded like I needed" Jackson [name] prescription, but he didn't give him one.  He reports Jackson [name] "light-grade" fever, occasional cough which is productive of "foam", some hoarseness.  Denies sick contacts - but his DIL is upstairs now with Jackson [name] COPD exacerbation.  He's been admitted with rhinovirus/enterovirus infection and suspected bacterial superinfection.  Treating for community acquired pneumonia.  Hopefully can discharge in next day or so pending improvement in o2 needs.    Assessment & Plan:   Principal Problem:   Fever Active Problems:   Sepsis due to pneumonia ([place])   CAP (community acquired pneumonia)   Rhinovirus infection   Respiratory failure, acute (HCC)   Complete heart block (HCC)   Elevated serum creatinine   Hyponatremia   Type 2 diabetes mellitus (HCC)   Essential hypertension   Dyslipidemia   DNR (do not resuscitate)   Type 2 diabetes mellitus with hyperglycemia (HCC)   Pneumonia   CHB (complete heart block) (HCC)   Assessment and Plan: Sepsis due to pneumonia ([place]) Ruled in, fever, leukocytosis, tachypnea, hypoxia CXR mild bilateral heterogenous opacities, most pronounced in lower lungs with new left upper lung patchy opacity Rhinovirus/enterovirus positive, given decompensation with O2 requirement, productive cough -> suspect possibility of bacteria superinfection MRSA pcr not detected, blood cx NGTDx2, sputum cx normal resp flora  Urine strep negative, legionella negative Continue abx for CAP   Complete heart block (HCC) Concern on tele for complete heart  block EKG this morning with 2nd degree AV nodal block type 1 Will c/s cardiology for assistance -> transient CHB, recommending conservative management, cardiology planning to follow tele He's not on any av nodal blocking med  Respiratory failure, acute ([place]) Improving, will follow O2 up to 10 L overnight (doubt significance of this?)? Not needing this much during the day, on 2 L today Wean as tolerated with treatment of pneumonia Wean o2 as tolerated Hopefully can d/c in next 24 hours or so  Hyponatremia Mild, follow  Elevated serum creatinine Improved Unclear baseline  Dyslipidemia statin  Essential hypertension lisinopril  Type 2 diabetes mellitus (HCC) Continue home basal regimen SSI     DVT prophylaxis: lovenox Code Status: dnr Family Communication: wife at bedside Disposition:   Status is: Observation The patient will require care spanning > 2 midnights and should be moved to inpatient because: need for IV abx, weaning O2   Consultants:  none  Procedures:  none  Antimicrobials:  Anti-infectives (From admission, onward)    Start     Dose/Rate Route Frequency Ordered Stop   03/08/22 1000  vancomycin (VANCOCIN) IVPB 1000 mg/200 mL premix  Status:  Discontinued        1,000 mg 200 mL/hr over 60 Minutes Intravenous Every 24 hours 03/07/22 0903 03/07/22 1919   03/07/22 1000  cefTRIAXone (ROCEPHIN) 1 g in sodium chloride 0.9 % 100 mL IVPB        1 g 200 mL/hr over 30 Minutes Intravenous Every 24 hours 03/07/22 0822 03/12/22 0959   03/07/22 1000  azithromycin (ZITHROMAX) tablet 500 mg  500 mg Oral Daily 03/07/22 0822 03/12/22 0959   03/07/22 1000  vancomycin (VANCOREADY) IVPB 1750 mg/350 mL        1,750 mg 175 mL/hr over 120 Minutes Intravenous  Once 03/07/22 0903 03/08/22 1315   03/06/22 1330  cefTRIAXone (ROCEPHIN) 2 g in sodium chloride 0.9 % 100 mL IVPB  Status:  Discontinued        2 g 200 mL/hr over 30 Minutes Intravenous Every 24 hours 03/06/22  1315 03/06/22 1514   03/06/22 1330  azithromycin (ZITHROMAX) 500 mg in sodium chloride 0.9 % 250 mL IVPB  Status:  Discontinued        500 mg 250 mL/hr over 60 Minutes Intravenous Every 24 hours 03/06/22 1315 03/06/22 1514       Subjective: Wife at bedside No new complaints, patient [name] little sleepy today  Objective: Vitals:   03/10/22 1127 03/10/22 1600 03/10/22 1704 03/10/22 1706  BP: (!) 151/55 (!) 129/115 (!) 139/41 (!) 139/41  Pulse: 62 65 65 65  Resp: 20 (!) 22 (!) 26 (!) 26  Temp: 98.2 F (36.8 C) 98.5 F (36.9 C) 98.5 F (36.9 C) 98.4 F (36.9 C)  TempSrc: Oral Oral Oral   SpO2: 92% 96% 96%   Weight:      Height:        Intake/Output Summary (Last 24 hours) at 03/10/2022 1921 Last data filed at 03/10/2022 1805 Gross per 24 hour  Intake 2543.49 ml  Output 1500 ml  Net 1043.49 ml   Filed Weights   03/08/22 0400 03/09/22 0521 03/10/22 0207  Weight: 82.1 kg 80.5 kg 86 kg    Examination:  General: No acute distress. Cardiovascular: RRR Lungs: occasional course breath sounds Abdomen: Soft, nontender, nondistended Neurological: Alert , but sleepy today. Moves all extremities 4 with equal strength. Cranial nerves II through XII grossly intact. Extremities: No clubbing or cyanosis. No edema.  Data Reviewed: I have personally reviewed following labs and imaging studies  CBC: Recent Labs  Lab 03/06/22 1155 03/07/22 0159 03/08/22 0323 03/09/22 0440 03/10/22 0824  WBC 11.0* 10.6* 9.9 8.7 7.0  NEUTROABS 8.5*  --  7.2 6.0 4.6  HGB 14.0 12.1* 11.6* 11.7* 11.8*  HCT 42.1 36.3* 34.5* 34.8* 34.6*  MCV 94.6 94.0 94.5 93.0 93.0  PLT 202 153 164 182 206    Basic Metabolic Panel: Recent Labs  Lab 03/07/22 0159 03/08/22 0323 03/08/22 1441 03/09/22 0440 03/10/22 0824  NA 128* 131* 131* 132* 135  K 3.9 3.8 4.2 4.0 3.5  CL 96* 95* 96* 97* 99  CO2 23 23 21* 27 28  GLUCOSE 188* 169* 195* 171* 177*  BUN 13 14 15 13 11   CREATININE 1.13 1.08 1.08 1.06 1.01   CALCIUM 8.6* 8.8* 9.2 8.9 9.1  MG  --  1.6*  --  1.9  --   PHOS  --  2.1*  --  3.1  --     GFR: Estimated Creatinine Clearance: 49.9 mL/min (by C-G formula based on SCr of 1.01 mg/dL).  Liver Function Tests: Recent Labs  Lab 03/06/22 1155 03/08/22 0323 03/09/22 0440 03/10/22 0824  AST 18 20 23 26   ALT 15 17 23 29   ALKPHOS 66 67 79 95  BILITOT 1.2 0.8 0.7 0.5  PROT 6.7 5.7* 5.8* 5.9*  ALBUMIN 3.5 2.7* 2.6* 2.6*    CBG: Recent Labs  Lab 03/09/22 1557 03/09/22 2106 03/10/22 0625 03/10/22 1124 03/10/22 1559  GLUCAP 167* 158* 133* 254* 256*     Recent  Results (from the past 240 hour(s))  Culture, blood (Routine x 2)     Status: None (Preliminary result)   Collection Time: 03/06/22 11:59 AM   Specimen: BLOOD  Result Value Ref Range Status   Specimen Description BLOOD RIGHT ANTECUBITAL  Final   Special Requests   Final    BOTTLES DRAWN AEROBIC AND ANAEROBIC Blood Culture results may not be optimal due to an excessive volume of blood received in culture bottles   Culture   Final    NO GROWTH 4 DAYS Performed at [facility], [place] [address] [place], [place] [address]    Report Status PENDING  Incomplete  Culture, blood (Routine x 2)     Status: None (Preliminary result)   Collection Time: 03/06/22 12:48 PM   Specimen: BLOOD  Result Value Ref Range Status   Specimen Description BLOOD BLOOD RIGHT ARM  Final   Special Requests   Final    BOTTLES DRAWN AEROBIC ONLY Blood Culture results may not be optimal due to an inadequate volume of blood received in culture bottles   Culture   Final    NO GROWTH 4 DAYS Performed at [facility], [place] [address] [place], [place] [address]    Report Status PENDING  Incomplete  Resp Panel by RT-PCR (Flu Taevin Mcferran&B, Covid) Anterior Nasal Swab     Status: None   Collection Time: 03/06/22  3:14 PM   Specimen: Anterior Nasal Swab  Result Value Ref Range Status   SARS Coronavirus 2 by RT PCR NEGATIVE NEGATIVE Final    Comment:  (NOTE) SARS-CoV-2 target nucleic acids are NOT DETECTED.  The SARS-CoV-2 RNA is generally detectable in upper respiratory specimens during the acute phase of infection. The lowest concentration of SARS-CoV-2 viral copies this assay can detect is 138 copies/mL. Laney Bagshaw negative result does not preclude SARS-Cov-2 infection and should not be used as the sole basis for treatment or other patient management decisions. Konstantinos Cordoba negative result may occur with  improper specimen collection/handling, submission of specimen other than nasopharyngeal swab, presence of viral mutation(s) within the areas targeted by this assay, and inadequate number of viral copies(<138 copies/mL). Yolonda Purtle negative result must be combined with clinical observations, patient history, and epidemiological information. The expected result is Negative.  Fact Sheet for Patients:  EntrepreneurPulse.com.au  Fact Sheet for Healthcare Providers:  IncredibleEmployment.be  This test is no t yet approved or cleared by the [place] FDA and  has been authorized for detection and/or diagnosis of SARS-CoV-2 by FDA under an Emergency Use Authorization (EUA). This EUA will remain  in effect (meaning this test can be used) for the duration of the COVID-19 declaration under Section 564(b)(1) of the Act, 21 U.S.C.section 360bbb-3(b)(1), unless the authorization is terminated  or revoked sooner.       Influenza Nuri Branca by PCR NEGATIVE NEGATIVE Final   Influenza B by PCR NEGATIVE NEGATIVE Final    Comment: (NOTE) The Xpert Xpress SARS-CoV-2/FLU/RSV plus assay is intended as an aid in the diagnosis of influenza from Nasopharyngeal swab specimens and should not be used as Corazon Nickolas sole basis for treatment. Nasal washings and aspirates are unacceptable for Xpert Xpress SARS-CoV-2/FLU/RSV testing.  Fact Sheet for Patients: EntrepreneurPulse.com.au  Fact Sheet for Healthcare  Providers: IncredibleEmployment.be  This test is not yet approved or cleared by the [place] FDA and has been authorized for detection and/or diagnosis of SARS-CoV-2 by FDA under an Emergency Use Authorization (EUA). This EUA will remain in effect (meaning this  test can be used) for the duration of the COVID-19 declaration under Section 564(b)(1) of the Act, 21 U.S.C. section 360bbb-3(b)(1), unless the authorization is terminated or revoked.  Performed at [facility], [address], [place], [place] [address]   Respiratory (~20 pathogens) panel by PCR     Status: Abnormal   Collection Time: 03/06/22  3:14 PM   Specimen: Nasopharyngeal Swab; Respiratory  Result Value Ref Range Status   Adenovirus NOT DETECTED NOT DETECTED Final   Coronavirus 229E NOT DETECTED NOT DETECTED Final    Comment: (NOTE) The Coronavirus on the Respiratory Panel, DOES NOT test for the novel  Coronavirus (2019 nCoV)    Coronavirus HKU1 NOT DETECTED NOT DETECTED Final   Coronavirus NL63 NOT DETECTED NOT DETECTED Final   Coronavirus OC43 NOT DETECTED NOT DETECTED Final   Metapneumovirus NOT DETECTED NOT DETECTED Final   Rhinovirus / Enterovirus DETECTED (Gertrude Tarbet) NOT DETECTED Final   Influenza Colon Rueth NOT DETECTED NOT DETECTED Final   Influenza B NOT DETECTED NOT DETECTED Final   Parainfluenza Virus 1 NOT DETECTED NOT DETECTED Final   Parainfluenza Virus 2 NOT DETECTED NOT DETECTED Final   Parainfluenza Virus 3 NOT DETECTED NOT DETECTED Final   Parainfluenza Virus 4 NOT DETECTED NOT DETECTED Final   Respiratory Syncytial Virus NOT DETECTED NOT DETECTED Final   Bordetella pertussis NOT DETECTED NOT DETECTED Final   Bordetella Parapertussis NOT DETECTED NOT DETECTED Final   Chlamydophila pneumoniae NOT DETECTED NOT DETECTED Final   Mycoplasma pneumoniae NOT DETECTED NOT DETECTED Final    Comment: Performed at [facility], [address], [place], [place] [address]  Expectorated  Sputum Assessment w Gram Stain, Rflx to Resp Cult     Status: None   Collection Time: 03/07/22  8:42 AM   Specimen: Expectorated Sputum  Result Value Ref Range Status   Specimen Description EXPECTORATED SPUTUM  Final   Special Requests NONE  Final   Sputum evaluation   Final    THIS SPECIMEN IS ACCEPTABLE FOR SPUTUM CULTURE Performed at [facility], [address], [place], [place] [address]    Report Status 03/07/2022 FINAL  Final  Culture, Respiratory w Gram Stain     Status: None   Collection Time: 03/07/22  8:42 AM  Result Value Ref Range Status   Specimen Description EXPECTORATED SPUTUM  Final   Special Requests NONE Reflexed from [id number]  Final   Gram Stain   Final    RARE WBC PRESENT,BOTH PMN AND MONONUCLEAR RARE GRAM POSITIVE COCCI IN PAIRS RARE GRAM VARIABLE ROD    Culture   Final    RARE Consistent with normal respiratory flora. Performed at [facility], [address] [place], [place] [address]    Report Status 03/09/2022 FINAL  Final  MRSA Next Gen by PCR, Nasal     Status: None   Collection Time: 03/07/22  8:52 AM   Specimen: Nasal Mucosa; Nasal Swab  Result Value Ref Range Status   MRSA by PCR Next Gen NOT DETECTED NOT DETECTED Final    Comment: (NOTE) The GeneXpert MRSA Assay (FDA approved for NASAL specimens only), is one component of Keliyah Spillman comprehensive MRSA colonization surveillance program. It is not intended to diagnose MRSA infection nor to guide or monitor treatment for MRSA infections. Test performance is not FDA approved in patients less than 56 years old. Performed at [facility], [address], [place], [place] [address]  Radiology Studies: DG CHEST PORT 1 VIEW  Result Date: 03/10/2022 CLINICAL DATA:  Shortness of breath EXAM: PORTABLE CHEST 1 VIEW COMPARISON:  Chest x-ray dated March 07, 2022 FINDINGS: Cardiac and mediastinal contours are unchanged. Mild bilateral heterogeneous opacities which are most pronounced in  the lower lungs with new left upper lung patchy opacity. No large pleural effusion or evidence of pneumothorax. IMPRESSION: Mild bilateral heterogeneous opacities which are most pronounced in the lower lungs with new left upper lung patchy opacity. Findings are concerning for multifocal infection. Electronically Signed   By: Yetta Glassman M.D.   On: 03/10/2022 09:12        Scheduled Meds:  aspirin EC  81 mg Oral Daily   azithromycin  500 mg Oral Daily   docusate sodium  100 mg Oral BID   enoxaparin (LOVENOX) injection  40 mg Subcutaneous Q24H   fluticasone  2 spray Each Nare BID   gabapentin  300 mg Oral QHS   insulin aspart  0-9 Units Subcutaneous TID WC   insulin glargine-yfgn  15 Units Subcutaneous QHS   levalbuterol  0.63 mg Nebulization TID   lisinopril  5 mg Oral Daily   pantoprazole  40 mg Oral QHS   rosuvastatin  10 mg Oral QHS   sodium chloride flush  3 mL Intravenous Q12H   Continuous Infusions:  sodium chloride 50 mL/hr at 03/10/22 1033   cefTRIAXone (ROCEPHIN)  IV Stopped (03/10/22 1720)     LOS: 3 days    Time spent: over 30 min    Fayrene Helper, MD [facility]   To contact the attending provider between 7A-7P or the covering provider during after hours 7P-7A, please log into the web site [email or web address] and access using universal [place] password for that web site. If you do not have the password, please call the hospital operator.  03/10/2022, 7:21 PM

## 2022-03-10 NOTE — Progress Notes (Signed)
Mobility Specialist Progress Note:   03/10/22 1428  Mobility  Activity Ambulated with assistance in hallway  Level of Assistance Minimal assist, patient does 75% or more  Assistive Device Front wheel walker  Distance Ambulated (ft) 220 ft  Activity Response Tolerated well  $Mobility charge 1 Mobility   Pt received in chair willing to participate in mobility. No complaints of pain. Left in chair with call bell in reach and all needs met.   [facility] Marsden Zaino Mobility Specialist

## 2022-03-10 NOTE — Progress Notes (Signed)
  Progress Note   Date: 03/10/2022  Patient Name: Jackson [name]        MRN#: [id number]  Magnesium was given for hypomagnesemia   I was asked via [facility] RN Query to comment on the results of this patient's serum magnesium level on the morning of 03/08/2022, which was found to be 1.6 prompting the patient's day rounding hospitalist to order magnesium sulfate 2 g IV over 2 hours x1 dose later on the morning of 03/08/2022.  This laboratory result is consistent with hypomagnesemia prompting the above supplementation efforts initiated by the patient's day rounding hospitalist a few hours later.  It appears that repeat serum magnesium level performed on the following day, 03/09/2022, indicated interval resolution of the patient's hypomagnesemia following the aforementioned IV supplementation efforts.    Newton Pigg, DO Hospitalist

## 2022-03-10 NOTE — Progress Notes (Signed)
  Progress Note   Date: 03/10/2022  Patient Name: Jackson [name]        MRN#: [id number]  Hypophosphatemia   I was asked via CDI RN Query to comment on the results of this patient's serum phosphorus level on the morning of 03/08/2022, which was found to be 2.1 prompting the patient's day rounding hospitalist to order Potassium phosphate 500 mg PO 4 times daily later on the morning of 03/08/2022.   This laboratory result is consistent with hypophosphatemia prompting the above supplementation efforts initiated by the patient's day rounding hospitalist a few hours later.  It appears that repeat serum phosphorus level performed on the following day, 03/09/2022, indicated interval resolution of the patient's hypophosphatemia following the aforementioned oral supplementation efforts.      Newton Pigg, DO Hospitalist

## 2022-03-10 NOTE — Progress Notes (Signed)
SATURATION QUALIFICATIONS: (This note is used to comply with regulatory documentation for home oxygen)  Patient Saturations on Room Air at Rest = 96%  Patient Saturations on Room Air while Ambulating = 85%  Patient Saturations on 2 Liters of oxygen while Ambulating = 94%

## 2022-03-10 NOTE — Progress Notes (Signed)
TRH night cross cover note:   I was notified by RN with request from the patient's wife, who is present at bedside, for the patient to have something for anxiety and to help him sleep.  I subsequently placed order for hydroxyzine 25 mg p.o. x1 dose as needed for anxiety/insomnia.  Refraining from benzodiazepines given the patient's age.    Jackson Pigg, DO Hospitalist

## 2022-03-10 NOTE — Progress Notes (Signed)
TRH night cross cover note:   I was contacted by RN requesting clarification on appropriate isolation in this patient who has tested positive for rhinovirus as well as enterovirus.  I conveyed that the patient would warrant droplet precaution in the context of his rhinovirus infection, but that he would also warrant contact precautions in the setting of his enterovirus infection.  I reconciled current isolation orders to reflect this.     Newton Pigg, DO Hospitalist

## 2022-03-11 DIAGNOSIS — A419 Sepsis, unspecified organism: Principal | ICD-10-CM

## 2022-03-11 LAB — BASIC METABOLIC PANEL
Anion gap: 11 (ref 5–15)
BUN: 9 mg/dL (ref 8–23)
CO2: 28 mmol/L (ref 22–32)
Calcium: 9.3 mg/dL (ref 8.9–10.3)
Chloride: 96 mmol/L — ABNORMAL LOW (ref 98–111)
Creatinine, Ser: 0.92 mg/dL (ref 0.61–1.24)
GFR, Estimated: >60 mL/min (ref >60)
Glucose, Bld: 131 mg/dL — ABNORMAL HIGH (ref 70–99)
Potassium: 3.8 mmol/L (ref 3.5–5.1)
Sodium: 135 mmol/L (ref 135–145)

## 2022-03-11 LAB — CBC WITH DIFFERENTIAL/PLATELET
Abs Immature Granulocytes: 0.03 10*3/uL (ref 0.00–0.07)
Basophils Absolute: 0 10*3/uL (ref 0.0–0.1)
Basophils Relative: 1 %
Eosinophils Absolute: 0.4 10*3/uL (ref 0.0–0.5)
Eosinophils Relative: 5 %
HCT: 36.5 % — ABNORMAL LOW (ref 39.0–52.0)
Hemoglobin: 12.2 g/dL — ABNORMAL LOW (ref 13.0–17.0)
Immature Granulocytes: 0 %
Lymphocytes Relative: 15 %
Lymphs Abs: 1.1 10*3/uL (ref 0.7–4.0)
MCH: 31.3 pg (ref 26.0–34.0)
MCHC: 33.4 g/dL (ref 30.0–36.0)
MCV: 93.6 fL (ref 80.0–100.0)
Monocytes Absolute: 1.4 10*3/uL — ABNORMAL HIGH (ref 0.1–1.0)
Monocytes Relative: 19 %
Neutro Abs: 4.7 10*3/uL (ref 1.7–7.7)
Neutrophils Relative %: 60 %
Platelets: 235 10*3/uL (ref 150–400)
RBC: 3.9 MIL/uL — ABNORMAL LOW (ref 4.22–5.81)
RDW: 12.5 % (ref 11.5–15.5)
WBC: 7.7 10*3/uL (ref 4.0–10.5)
nRBC: 0 % (ref 0.0–0.2)

## 2022-03-11 LAB — GLUCOSE, CAPILLARY
Glucose-Capillary: 127 mg/dL — ABNORMAL HIGH (ref 70–99)
Glucose-Capillary: 148 mg/dL — ABNORMAL HIGH (ref 70–99)

## 2022-03-11 LAB — CULTURE, BLOOD (ROUTINE X 2)
Culture: NO GROWTH
Culture: NO GROWTH

## 2022-03-11 MED ORDER — LEVALBUTEROL HCL 0.63 MG/3ML IN NEBU: 0.6300 mg | INHALATION_SOLUTION | RESPIRATORY_TRACT | Status: DC | PRN

## 2022-03-11 MED ORDER — GUAIFENESIN ER 600 MG PO TB12: 600.0000 mg | ORAL_TABLET | Freq: Two times a day (BID) | ORAL | 0 refills | Status: AC | PRN

## 2022-03-11 NOTE — Progress Notes (Signed)
OT Cancellation Note  Patient Details Name: Jackson [name] MRN: [id number] DOB: 05/31/1929   Cancelled Treatment:    Reason Eval/Treat Not Completed: Patient declined, no reason specified (Patient declined OT stating they were getting ready to go home and wanted to rest.) Alfonse Flavors, OTA Acute Rehabilitation Services  Office [phone]  Dewain Penning 03/11/2022, 1:39 PM

## 2022-03-11 NOTE — TOC Transition Note (Signed)
Transition of Care [facility]) - CM/SW Discharge Note   Patient Details  Name: Jackson [name] MRN: [id number] Date of Birth: 25-Mar-1929  Transition of Care [facility]) CM/SW Contact:  Leone Haven, RN Phone Number: 03/11/2022, 11:55 AM   Clinical Narrative:    NCM spoke with wife at the bedside, offered choice, she  does not have a preference but said lets try [facility], NCM made referral to [facility] for HHPT, HHOT, she states she will get back with this NCM.  Also wife states she is ok with [employer] supplying the oxygen for patient.  NCM made referral to [facility] with [employer]. Patient has a walker, rollator and a w/chair at home.  Wife states she will transport patient home by car and her son will come up to assist her with getting him in the car. NCM awaiting call back from [place] with [facility].    Final next level of care: Home w Home Health Services Barriers to Discharge: No Barriers Identified   Patient Goals and CMS Choice Patient states their goals for this hospitalization and ongoing recovery are:: return home CMS Medicare.gov Compare Post Acute Care list provided to:: Patient Represenative (must comment) Choice offered to / list presented to : Spouse  Discharge Placement                       Discharge Plan and Services                DME Arranged: Oxygen DME Agency: [employer] Date DME Agency Contacted: 03/11/22 Time DME Agency Contacted: 1154 Representative spoke with at DME Agency: Ian Malkin HH Arranged: PT, OT HH Agency: [facility] Date HH Agency Contacted: 03/11/22 Time HH Agency Contacted: 1155 Representative spoke with at [facility] Agency: Morrie Sheldon  Social Determinants of Health (SDOH) Interventions     Readmission Risk Interventions     No data to display

## 2022-03-11 NOTE — Discharge Instructions (Addendum)
Jackson [name],  You were here with pneumonia. You have a viral infection but may also have a bacterial infection. You, thankfully, completed a full course of antibiotics prior to your discharge day, so you will no longer required continued antibiotic therapy. You are requiring oxygen on discharge. Hopefully this will improve as your pneumonia continues to improve and your lung function improves.

## 2022-03-11 NOTE — Discharge Summary (Incomplete)
Physician Discharge Summary   Patient: Jackson [name] MRN: [id number] DOB: 10-03-1928  Admit date:     03/06/2022  Discharge date: {dischdate:26783}  Discharge Physician: Cordelia Poche   PCP: Josetta Huddle, MD   Recommendations at discharge:  {Tip this will not be part of the note when signed- Example include specific recommendations for outpatient follow-up, pending tests to follow-up on. (Optional):26781}  ***  Discharge Diagnoses: Principal Problem:   Fever Active Problems:   Sepsis due to pneumonia ([place])   CAP (community acquired pneumonia)   Rhinovirus infection   Respiratory failure, acute (HCC)   Complete heart block (HCC)   Elevated serum creatinine   Hyponatremia   Type 2 diabetes mellitus ([place])   Essential hypertension   Dyslipidemia   DNR (do not resuscitate)   Type 2 diabetes mellitus with hyperglycemia (HCC)   Pneumonia   CHB (complete heart block) ([place])  Resolved Problems:   * No resolved hospital problems. *  Hospital Course: Jackson [name] is a 88 y.o. male with medical history significant of DM; HTN; HLD; and chronic diastolic CHF presenting with fever. He reports that he has PNA, has had it before and it's "hard to shake."  He saw Dr. Inda Merlin Tuesday and "he sounded like I needed" a prescription, but he didn't give him one.  He reports a "light-grade" fever, occasional cough which is productive of "foam", some hoarseness.  Denies sick contacts - but his DIL is upstairs now with a COPD exacerbation.  He's been admitted with rhinovirus/enterovirus infection and suspected bacterial superinfection.  Treating for community acquired pneumonia.  Hopefully can discharge in next day or so pending improvement in o2 needs.  Assessment and Plan: Sepsis due to pneumonia ([place]) Ruled in, fever, leukocytosis, tachypnea, hypoxia CXR mild bilateral heterogenous opacities, most pronounced in lower lungs with new left upper lung patchy opacity Rhinovirus/enterovirus positive,  given decompensation with O2 requirement, productive cough -> suspect possibility of bacteria superinfection MRSA pcr not detected, blood cx NGTDx2, sputum cx normal resp flora  Urine strep negative, legionella negative Continue abx for CAP   Complete heart block (HCC) Concern on tele for complete heart block EKG this morning with 2nd degree AV nodal block type 1 Will c/s cardiology for assistance -> transient CHB, recommending conservative management, cardiology planning to follow tele He's not on any av nodal blocking med  Respiratory failure, acute ([place]) Improving, will follow O2 up to 10 L overnight (doubt significance of this?)? Not needing this much during the day, on 2 L today Wean as tolerated with treatment of pneumonia Wean o2 as tolerated Hopefully can d/c in next 24 hours or so  Hyponatremia Mild, follow  Elevated serum creatinine Improved Unclear baseline  Dyslipidemia statin  Essential hypertension lisinopril  Type 2 diabetes mellitus ([place]) Continue home basal regimen SSI      {Tip this will not be part of the note when signed Body mass index is 28.42 kg/m. , ,  (Optional):26781}  {(NOTE) Pain control PDMP Statment (Optional):26782} Consultants: *** Procedures performed: ***  Disposition: {Plan; Disposition:26390} Diet recommendation:  {Diet_Plan:26776} DISCHARGE MEDICATION: Allergies as of 03/11/2022       Reactions   Actos [pioglitazone] Swelling   Formaldehyde Other (See Comments)   Flu-like symptoms   Lipitor [atorvastatin Calcium] Other (See Comments)   unknown   Metformin And Related Other (See Comments)   unknown        Medication List     TAKE these medications    albuterol (2.5  MG/3ML) 0.083% nebulizer solution Commonly known as: PROVENTIL Take 2.5 mg by nebulization every 6 (six) hours as needed for wheezing or shortness of breath.   aspirin EC 81 MG tablet Take 81 mg by mouth daily.   CALCIUM 600+D PO Take 1 tablet  by mouth daily.   docusate sodium 100 MG capsule Commonly known as: COLACE Take 100 mg by mouth at bedtime.   fluticasone 50 MCG/ACT nasal spray Commonly known as: FLONASE Place 2 sprays into both nostrils 2 (two) times daily.   gabapentin 100 MG capsule Commonly known as: NEURONTIN Take 300 mg by mouth at bedtime.   guaiFENesin 600 MG 12 hr tablet Commonly known as: MUCINEX Take 1 tablet (600 mg total) by mouth 2 (two) times daily as needed for up to 5 days for cough.   insulin aspart 100 UNIT/ML FlexPen Commonly known as: NovoLOG FlexPen Before each meal 3 times a day, 140-199 - 2 units, 200-250 - 4 units, 251-299 - 6 units,  300-349 - 8 units,  350 or above 10 units. What changed:  how much to take how to take this when to take this additional instructions   insulin glargine 100 UNIT/ML Solostar Pen Commonly known as: LANTUS Inject 15 Units into the skin daily at 10 pm.   lisinopril 5 MG tablet Commonly known as: ZESTRIL Take 1 tablet (5 mg total) by mouth daily.   omeprazole 20 MG capsule Commonly known as: PRILOSEC Take 20 mg by mouth daily.   rosuvastatin 10 MG tablet Commonly known as: CRESTOR TAKE 1 TABLET AT BEDTIME   sodium chloride 0.65 % Soln nasal spray Commonly known as: OCEAN Place 1 spray into both nostrils as needed for congestion.   tetrahydrozoline-zinc 0.05-0.25 % ophthalmic solution Commonly known as: VISINE-AC Place 2 drops into both eyes 3 (three) times daily as needed (dry eyes).               Durable Medical Equipment  (From admission, onward)           Start     Ordered   03/11/22 1147  For home use only DME oxygen  Once       Question Answer Comment  Length of Need 6 Months   Mode or (Route) Nasal cannula   Liters per Minute 3   Frequency Continuous (stationary and portable oxygen unit needed)   Oxygen delivery system Gas      03/11/22 1146            Follow-up Information     Marden Noble, MD. Schedule an  appointment as soon as possible for a visit in 1 week(s).   Specialty: Internal Medicine Why: For hospital follow-up Contact information: [address] [place] [address] [phone]         [facility] Follow up.   Why: HHPT, HHOT- Agency will contact your for apt times               Discharge Exam: Filed Weights   03/09/22 0521 03/10/22 0207 03/11/22 0353  Weight: 80.5 kg 86 kg 82.3 kg   ***  Condition at discharge: {DC Condition:26389}  The results of significant diagnostics from this hospitalization (including imaging, microbiology, ancillary and laboratory) are listed below for reference.   Imaging Studies: DG CHEST PORT 1 VIEW  Result Date: 03/10/2022 CLINICAL DATA:  Shortness of breath EXAM: PORTABLE CHEST 1 VIEW COMPARISON:  Chest x-ray dated March 07, 2022 FINDINGS: Cardiac and mediastinal contours are  unchanged. Mild bilateral heterogeneous opacities which are most pronounced in the lower lungs with new left upper lung patchy opacity. No large pleural effusion or evidence of pneumothorax. IMPRESSION: Mild bilateral heterogeneous opacities which are most pronounced in the lower lungs with new left upper lung patchy opacity. Findings are concerning for multifocal infection. Electronically Signed   By: Allegra Lai M.D.   On: 03/10/2022 09:12   DG CHEST PORT 1 VIEW  Result Date: 03/07/2022 CLINICAL DATA:  Shortness of breath. EXAM: PORTABLE CHEST 1 VIEW COMPARISON:  03/06/2022 FINDINGS: Lungs are adequately inflated demonstrate mild patchy opacification over the mid to lower lungs without significant change which may be due to atelectasis or infection. No significant effusion. Known eventration over the anterior aspect of the right hemidiaphragm unchanged. Cardiomediastinal silhouette and remainder of the exam is unchanged. IMPRESSION: Persistent patchy opacification over the mid to lower lungs which may be due to atelectasis or  infection. Electronically Signed   By: Elberta Fortis M.D.   On: 03/07/2022 09:19   DG Chest 2 View  Result Date: 03/06/2022 CLINICAL DATA:  Suspected Sepsis EXAM: CHEST - 2 VIEW COMPARISON:  November 18, 2021 FINDINGS: The heart size and mediastinal contours are stable. Low lung volumes. Again seen is the eventration of the anterior right hemidiaphragm. Mild atelectasis at the bibasilar regions greater on the left. The visualized skeletal structures are unremarkable. IMPRESSION: Low lung volumes.  Bibasilar atelectasis greater on the left. Electronically Signed   By: Marjo Bicker M.D.   On: 03/06/2022 12:40    Microbiology: Results for orders placed or performed during the hospital encounter of 03/06/22  Culture, blood (Routine x 2)     Status: None   Collection Time: 03/06/22 11:59 AM   Specimen: BLOOD  Result Value Ref Range Status   Specimen Description BLOOD RIGHT ANTECUBITAL  Final   Special Requests   Final    BOTTLES DRAWN AEROBIC AND ANAEROBIC Blood Culture results may not be optimal due to an excessive volume of blood received in culture bottles   Culture   Final    NO GROWTH 5 DAYS Performed at [facility], [address] [place], [place] [address]    Report Status 03/11/2022 FINAL  Final  Culture, blood (Routine x 2)     Status: None   Collection Time: 03/06/22 12:48 PM   Specimen: BLOOD  Result Value Ref Range Status   Specimen Description BLOOD BLOOD RIGHT ARM  Final   Special Requests   Final    BOTTLES DRAWN AEROBIC ONLY Blood Culture results may not be optimal due to an inadequate volume of blood received in culture bottles   Culture   Final    NO GROWTH 5 DAYS Performed at [facility], [address], [place], [place] [address]    Report Status 03/11/2022 FINAL  Final  Resp Panel by RT-PCR (Flu A&B, Covid) Anterior Nasal Swab     Status: None   Collection Time: 03/06/22  3:14 PM   Specimen: Anterior Nasal Swab  Result Value Ref Range Status   SARS  Coronavirus 2 by RT PCR NEGATIVE NEGATIVE Final    Comment: (NOTE) SARS-CoV-2 target nucleic acids are NOT DETECTED.  The SARS-CoV-2 RNA is generally detectable in upper respiratory specimens during the acute phase of infection. The lowest concentration of SARS-CoV-2 viral copies this assay can detect is 138 copies/mL. A negative result does not preclude SARS-Cov-2 infection and should not be used as the sole basis  for treatment or other patient management decisions. A negative result may occur with  improper specimen collection/handling, submission of specimen other than nasopharyngeal swab, presence of viral mutation(s) within the areas targeted by this assay, and inadequate number of viral copies(<138 copies/mL). A negative result must be combined with clinical observations, patient history, and epidemiological information. The expected result is Negative.  Fact Sheet for Patients:  EntrepreneurPulse.com.au  Fact Sheet for Healthcare Providers:  IncredibleEmployment.be  This test is no t yet approved or cleared by the [place] FDA and  has been authorized for detection and/or diagnosis of SARS-CoV-2 by FDA under an Emergency Use Authorization (EUA). This EUA will remain  in effect (meaning this test can be used) for the duration of the COVID-19 declaration under Section 564(b)(1) of the Act, 21 U.S.C.section 360bbb-3(b)(1), unless the authorization is terminated  or revoked sooner.       Influenza A by PCR NEGATIVE NEGATIVE Final   Influenza B by PCR NEGATIVE NEGATIVE Final    Comment: (NOTE) The Xpert Xpress SARS-CoV-2/FLU/RSV plus assay is intended as an aid in the diagnosis of influenza from Nasopharyngeal swab specimens and should not be used as a sole basis for treatment. Nasal washings and aspirates are unacceptable for Xpert Xpress SARS-CoV-2/FLU/RSV testing.  Fact Sheet for  Patients: EntrepreneurPulse.com.au  Fact Sheet for Healthcare Providers: IncredibleEmployment.be  This test is not yet approved or cleared by the [place] FDA and has been authorized for detection and/or diagnosis of SARS-CoV-2 by FDA under an Emergency Use Authorization (EUA). This EUA will remain in effect (meaning this test can be used) for the duration of the COVID-19 declaration under Section 564(b)(1) of the Act, 21 U.S.C. section 360bbb-3(b)(1), unless the authorization is terminated or revoked.  Performed at [facility], [place] [address] [place], [place] [address]   Respiratory (~20 pathogens) panel by PCR     Status: Abnormal   Collection Time: 03/06/22  3:14 PM   Specimen: Nasopharyngeal Swab; Respiratory  Result Value Ref Range Status   Adenovirus NOT DETECTED NOT DETECTED Final   Coronavirus 229E NOT DETECTED NOT DETECTED Final    Comment: (NOTE) The Coronavirus on the Respiratory Panel, DOES NOT test for the novel  Coronavirus (2019 nCoV)    Coronavirus HKU1 NOT DETECTED NOT DETECTED Final   Coronavirus NL63 NOT DETECTED NOT DETECTED Final   Coronavirus OC43 NOT DETECTED NOT DETECTED Final   Metapneumovirus NOT DETECTED NOT DETECTED Final   Rhinovirus / Enterovirus DETECTED (A) NOT DETECTED Final   Influenza A NOT DETECTED NOT DETECTED Final   Influenza B NOT DETECTED NOT DETECTED Final   Parainfluenza Virus 1 NOT DETECTED NOT DETECTED Final   Parainfluenza Virus 2 NOT DETECTED NOT DETECTED Final   Parainfluenza Virus 3 NOT DETECTED NOT DETECTED Final   Parainfluenza Virus 4 NOT DETECTED NOT DETECTED Final   Respiratory Syncytial Virus NOT DETECTED NOT DETECTED Final   Bordetella pertussis NOT DETECTED NOT DETECTED Final   Bordetella Parapertussis NOT DETECTED NOT DETECTED Final   Chlamydophila pneumoniae NOT DETECTED NOT DETECTED Final   Mycoplasma pneumoniae NOT DETECTED NOT DETECTED Final    Comment: Performed at  [facility], [place]. [address] [place], [place] [address]  Expectorated Sputum Assessment w Gram Stain, Rflx to Resp Cult     Status: None   Collection Time: 03/07/22  8:42 AM   Specimen: Expectorated Sputum  Result Value Ref Range Status   Specimen Description EXPECTORATED SPUTUM  Final   Special  Requests NONE  Final   Sputum evaluation   Final    THIS SPECIMEN IS ACCEPTABLE FOR SPUTUM CULTURE Performed at [facility], [place] [address] [place], [place] [address]    Report Status 03/07/2022 FINAL  Final  Culture, Respiratory w Gram Stain     Status: None   Collection Time: 03/07/22  8:42 AM  Result Value Ref Range Status   Specimen Description EXPECTORATED SPUTUM  Final   Special Requests NONE Reflexed from [id number]  Final   Gram Stain   Final    RARE WBC PRESENT,BOTH PMN AND MONONUCLEAR RARE GRAM POSITIVE COCCI IN PAIRS RARE GRAM VARIABLE ROD    Culture   Final    RARE Consistent with normal respiratory flora. Performed at [facility], [place] [address], [place], [place] [address]    Report Status 03/09/2022 FINAL  Final  MRSA Next Gen by PCR, Nasal     Status: None   Collection Time: 03/07/22  8:52 AM   Specimen: Nasal Mucosa; Nasal Swab  Result Value Ref Range Status   MRSA by PCR Next Gen NOT DETECTED NOT DETECTED Final    Comment: (NOTE) The GeneXpert MRSA Assay (FDA approved for NASAL specimens only), is one component of a comprehensive MRSA colonization surveillance program. It is not intended to diagnose MRSA infection nor to guide or monitor treatment for MRSA infections. Test performance is not FDA approved in patients less than 93 years old. Performed at [facility], [place] [address], [place], [place] [address]     Labs: CBC: Recent Labs  Lab 03/06/22 1155 03/07/22 0159 03/08/22 0323 03/09/22 0440 03/10/22 0824 03/11/22 0356  WBC 11.0* 10.6* 9.9 8.7 7.0 7.7  NEUTROABS 8.5*  --  7.2 6.0 4.6 4.7  HGB 14.0 12.1* 11.6* 11.7* 11.8* 12.2*   HCT 42.1 36.3* 34.5* 34.8* 34.6* 36.5*  MCV 94.6 94.0 94.5 93.0 93.0 93.6  PLT 202 153 164 182 206 [id number]   Basic Metabolic Panel: Recent Labs  Lab 03/08/22 0323 03/08/22 1441 03/09/22 0440 03/10/22 0824 03/11/22 0356  NA 131* 131* 132* 135 135  K 3.8 4.2 4.0 3.5 3.8  CL 95* 96* 97* 99 96*  CO2 23 21* 27 28 28   GLUCOSE 169* 195* 171* 177* 131*  BUN 14 15 13 11 9   CREATININE 1.08 1.08 1.06 1.01 0.92  CALCIUM 8.8* 9.2 8.9 9.1 9.3  MG 1.6*  --  1.9  --   --   PHOS 2.1*  --  3.1  --   --    Liver Function Tests: Recent Labs  Lab 03/06/22 1155 03/08/22 0323 03/09/22 0440 03/10/22 0824  AST 18 20 23 26   ALT 15 17 23 29   ALKPHOS 66 67 79 95  BILITOT 1.2 0.8 0.7 0.5  PROT 6.7 5.7* 5.8* 5.9*  ALBUMIN 3.5 2.7* 2.6* 2.6*   CBG: Recent Labs  Lab 03/10/22 1124 03/10/22 1559 03/10/22 2134 03/11/22 0615 03/11/22 1057  GLUCAP 254* 256* 151* 127* 148*    Discharge time spent: {LESS THAN/GREATER THAN:26388} 30 minutes.  Signed: Cordelia Poche, MD [facility] 03/11/2022

## 2022-03-11 NOTE — Progress Notes (Signed)
Patient refused to wait until he get his portable O2 and left AMA. Dr Caleb Popp notified

## 2022-03-11 NOTE — Progress Notes (Signed)
Physical Therapy Treatment Patient Details Name: Jackson [name] MRN: [id number] DOB: Oct 01, 1928 Today's Date: 03/11/2022   History of Present Illness Pt adm 6/16 with PNA. PMH - DM, HTN, CHF, neuropathy, djd, lt thr    PT Comments    Pt tolerates treatment well, ambulating for increased distances this session. Pt continues to require supplemental oxygen to maintain sats above 90%, desaturating on room air and with less than 3L of supplemental oxygen. Pt will benefit from aggressive mobilization in an effort to improve activity tolerance and to restore independence.   Recommendations for follow up therapy are one component of a multi-disciplinary discharge planning process, led by the attending physician.  Recommendations may be updated based on patient status, additional functional criteria and insurance authorization.  Follow Up Recommendations  Home health PT     Assistance Recommended at Discharge Intermittent Supervision/Assistance  Patient can return home with the following A little help with walking and/or transfers;Help with stairs or ramp for entrance;A little help with bathing/dressing/bathroom   Equipment Recommendations  None recommended by PT    Recommendations for Other Services       Precautions / Restrictions Precautions Precautions: Fall Precaution Comments: monitor HR and sats, requires O2 today Restrictions Weight Bearing Restrictions: No     Mobility  Bed Mobility Overal bed mobility: Needs Assistance Bed Mobility: Supine to Sit     Supine to sit: Supervision, HOB elevated          Transfers Overall transfer level: Needs assistance Equipment used: Rolling walker (2 wheels) Transfers: Sit to/from Stand, Bed to chair/wheelchair/BSC Sit to Stand: Supervision   Step pivot transfers: Min guard       General transfer comment: step pivot without device    Ambulation/Gait Ambulation/Gait assistance: Min guard Gait Distance (Feet): 200  Feet Assistive device: Rolling walker (2 wheels) Gait Pattern/deviations: Step-through pattern Gait velocity: reduced Gait velocity interpretation: <1.8 ft/sec, indicate of risk for recurrent falls   General Gait Details: pt with slowed step-through gait, reduced gait speed, flexed posture which pt temporarily corrects with verbal cueing   Stairs             Wheelchair Mobility    Modified Rankin (Stroke Patients Only)       Balance Overall balance assessment: Needs assistance Sitting-balance support: No upper extremity supported, Feet supported Sitting balance-Leahy Scale: Fair     Standing balance support: Bilateral upper extremity supported, Reliant on assistive device for balance Standing balance-Leahy Scale: Poor                              Cognition Arousal/Alertness: Awake/alert Behavior During Therapy: WFL for tasks assessed/performed Overall Cognitive Status: Within Functional Limits for tasks assessed                                          Exercises      General Comments General comments (skin integrity, edema, etc.): pt on 3L [place] upon PT arrival, desats when weaned to room air into mid 80s. Pt requires 3L [place] with mobility to maintain sats in 90s.      Pertinent Vitals/Pain Pain Assessment Pain Assessment: No/denies pain    Home Living                          Prior Function  PT Goals (current goals can now be found in the care plan section) Acute Rehab PT Goals Patient Stated Goal: not stated Progress towards PT goals: Progressing toward goals    Frequency    Min 3X/week      PT Plan Current plan remains appropriate    Co-evaluation              AM-PAC PT "6 Clicks" Mobility   Outcome Measure  Help needed turning from your back to your side while in a flat bed without using bedrails?: A Little Help needed moving from lying on your back to sitting on the side of a flat  bed without using bedrails?: A Little Help needed moving to and from a bed to a chair (including a wheelchair)?: A Little Help needed standing up from a chair using your arms (e.g., wheelchair or bedside chair)?: A Little Help needed to walk in hospital room?: A Little Help needed climbing 3-5 steps with a railing? : A Little 6 Click Score: 18    End of Session Equipment Utilized During Treatment: Oxygen Activity Tolerance: Patient tolerated treatment well Patient left: in chair;with call bell/phone within reach;with family/visitor present Nurse Communication: Mobility status PT Visit Diagnosis: Unsteadiness on feet (R26.81);Muscle weakness (generalized) (M62.81);Dizziness and giddiness (R42)     Time: 6568-1275 PT Time Calculation (min) (ACUTE ONLY): 27 min  Charges:  $Gait Training: 8-22 mins $Therapeutic Activity: 8-22 mins                     Arlyss Gandy, PT, DPT Acute Rehabilitation Office [phone]    Arlyss Gandy 03/11/2022, 10:30 AM

## 2022-03-11 NOTE — Progress Notes (Signed)
Pt has orders to be discharged. Discharge instructions given and pt has no additional questions at this time. Medication regimen reviewed and pt educated. Pt verbalized understanding and has no additional questions. Telemetry box removed. IV removed and site in good condition. Pt stable and waiting for transportation and oxygen.

## 2022-03-11 NOTE — Progress Notes (Signed)
Signed                      SATURATION QUALIFICATIONS: (This note is used to comply with regulatory documentation for home oxygen)   Patient Saturations on Room Air at Rest = 96%   Patient Saturations on Room Air while Ambulating = 85%   Patient Saturations on 2 Liters of oxygen while Ambulating = 94%      Co-sign for [employer], mobility specialist.

## 2022-03-11 NOTE — Care Management (Signed)
ED RNCM received call from RN on 3E concerning update on oxygen delivery from [facility] as per RN patient and family are anxious to leave.  CM will contact [employer] for update.

## 2022-03-11 NOTE — Care Management (Signed)
Contacted [employer] was informed that oxygen will be delivered shortly.  CM contacted RN with update and was informed that patient left AMA., but informed RN that they would borrow oxygen from a friend.

## 2022-03-12 ENCOUNTER — Other Ambulatory Visit: Payer: Self-pay

## 2022-03-12 ENCOUNTER — Emergency Department (HOSPITAL_COMMUNITY): Payer: Medicare (Managed Care)

## 2022-03-12 ENCOUNTER — Encounter (HOSPITAL_COMMUNITY): Payer: Self-pay

## 2022-03-12 ENCOUNTER — Emergency Department (HOSPITAL_COMMUNITY)
Admission: EM | Admit: 2022-03-12 | Discharge: 2022-03-12 | Disposition: A | Discharge status: Home / Self Care | Payer: Medicare (Managed Care) | Attending: Emergency Medicine | Admitting: Emergency Medicine

## 2022-03-12 DIAGNOSIS — E119 Type 2 diabetes mellitus without complications: Secondary | ICD-10-CM | POA: Diagnosis not present

## 2022-03-12 DIAGNOSIS — Z79899 Other long term (current) drug therapy: Secondary | ICD-10-CM | POA: Diagnosis not present

## 2022-03-12 DIAGNOSIS — Z7982 Long term (current) use of aspirin: Secondary | ICD-10-CM | POA: Insufficient documentation

## 2022-03-12 DIAGNOSIS — Z794 Long term (current) use of insulin: Secondary | ICD-10-CM | POA: Diagnosis not present

## 2022-03-12 DIAGNOSIS — I10 Essential (primary) hypertension: Secondary | ICD-10-CM | POA: Diagnosis not present

## 2022-03-12 DIAGNOSIS — R531 Weakness: Secondary | ICD-10-CM | POA: Diagnosis not present

## 2022-03-12 DIAGNOSIS — R0602 Shortness of breath: Secondary | ICD-10-CM | POA: Diagnosis not present

## 2022-03-12 DIAGNOSIS — J189 Pneumonia, unspecified organism: Secondary | ICD-10-CM | POA: Diagnosis not present

## 2022-03-12 DIAGNOSIS — J90 Pleural effusion, not elsewhere classified: Secondary | ICD-10-CM | POA: Diagnosis not present

## 2022-03-12 LAB — CBC WITH DIFFERENTIAL/PLATELET
Abs Immature Granulocytes: 0.03 10*3/uL (ref 0.00–0.07)
Basophils Absolute: 0.1 10*3/uL (ref 0.0–0.1)
Basophils Relative: 1 %
Eosinophils Absolute: 0.5 10*3/uL (ref 0.0–0.5)
Eosinophils Relative: 9 %
HCT: 36.1 % — ABNORMAL LOW (ref 39.0–52.0)
Hemoglobin: 11.8 g/dL — ABNORMAL LOW (ref 13.0–17.0)
Immature Granulocytes: 1 %
Lymphocytes Relative: 17 %
Lymphs Abs: 1 10*3/uL (ref 0.7–4.0)
MCH: 30.7 pg (ref 26.0–34.0)
MCHC: 32.7 g/dL (ref 30.0–36.0)
MCV: 94 fL (ref 80.0–100.0)
Monocytes Absolute: 0.9 10*3/uL (ref 0.1–1.0)
Monocytes Relative: 15 %
Neutro Abs: 3.3 10*3/uL (ref 1.7–7.7)
Neutrophils Relative %: 57 %
Platelets: 296 10*3/uL (ref 150–400)
RBC: 3.84 MIL/uL — ABNORMAL LOW (ref 4.22–5.81)
RDW: 12.2 % (ref 11.5–15.5)
WBC: 5.7 10*3/uL (ref 4.0–10.5)
nRBC: 0 % (ref 0.0–0.2)

## 2022-03-12 LAB — BASIC METABOLIC PANEL
Anion gap: 6 (ref 5–15)
BUN: 15 mg/dL (ref 8–23)
CO2: 32 mmol/L (ref 22–32)
Calcium: 9.5 mg/dL (ref 8.9–10.3)
Chloride: 97 mmol/L — ABNORMAL LOW (ref 98–111)
Creatinine, Ser: 0.97 mg/dL (ref 0.61–1.24)
GFR, Estimated: >60 mL/min (ref >60)
Glucose, Bld: 282 mg/dL — ABNORMAL HIGH (ref 70–99)
Potassium: 3.7 mmol/L (ref 3.5–5.1)
Sodium: 135 mmol/L (ref 135–145)

## 2022-03-12 NOTE — ED Notes (Signed)
Jackson [name], Blue sent down as saves if needed.

## 2022-03-12 NOTE — ED Provider Notes (Incomplete)
[facility]-EMERGENCY DEPT Provider Note   CSN: [id number] Arrival date & time: 03/12/22  1835     History {Add pertinent medical, surgical, social history, OB history to HPI:1} No chief complaint on file.   Jackson [name] is a 88 y.o. male.  Patient with diabetes hypertension and was discharged yesterday with pneumonia.  His wife became sick and they brought her to the hospital be evaluated and he was brought also.  Patient has no complaints   Weakness      Home Medications Prior to Admission medications   Medication Sig Start Date End Date Taking? Authorizing Provider  albuterol (PROVENTIL) (2.5 MG/3ML) 0.083% nebulizer solution Take 2.5 mg by nebulization every 6 (six) hours as needed for wheezing or shortness of breath.    [provider]  aspirin EC 81 MG tablet Take 81 mg by mouth daily.    [provider]  Calcium Carbonate-Vitamin D (CALCIUM 600+D PO) Take 1 tablet by mouth daily.    [provider]  docusate sodium (COLACE) 100 MG capsule Take 100 mg by mouth at bedtime.    [provider]  fluticasone (FLONASE) 50 MCG/ACT nasal spray Place 2 sprays into both nostrils 2 (two) times daily. 11/20/18   Arrien, York Ram, MD  gabapentin (NEURONTIN) 100 MG capsule Take 300 mg by mouth at bedtime.     [provider]  guaiFENesin (MUCINEX) 600 MG 12 hr tablet Take 1 tablet (600 mg total) by mouth 2 (two) times daily as needed for up to 5 days for cough. 03/11/22 03/16/22  Narda Bonds, MD  insulin aspart (NOVOLOG FLEXPEN) 100 UNIT/ML FlexPen Before each meal 3 times a day, 140-199 - 2 units, 200-250 - 4 units, 251-299 - 6 units,  300-349 - 8 units,  350 or above 10 units. Patient taking differently: Inject 2-10 Units into the skin See admin instructions. Per sliding scale before each meal 3 times a day, 140-199 = 2 units, 200-250 =4 units, 251-299 =6 units,  300-349 = 8 units,  350 or above 10 units. 02/28/15    Albertine Grates, MD  Insulin Glargine (LANTUS) 100 UNIT/ML Solostar Pen Inject 15 Units into the skin daily at 10 pm.    [provider]  lisinopril (PRINIVIL,ZESTRIL) 5 MG tablet Take 1 tablet (5 mg total) by mouth daily. 12/26/18   Patwardhan, Anabel Bene, MD  omeprazole (PRILOSEC) 20 MG capsule Take 20 mg by mouth daily. 10/20/16   [provider]  rosuvastatin (CRESTOR) 10 MG tablet TAKE 1 TABLET AT BEDTIME Patient taking differently: Take 10 mg by mouth at bedtime. 03/26/20   Yates Decamp, MD  sodium chloride (OCEAN) 0.65 % SOLN nasal spray Place 1 spray into both nostrils as needed for congestion. 11/20/18   Arrien, York Ram, MD  tetrahydrozoline-zinc (VISINE-AC) 0.05-0.25 % ophthalmic solution Place 2 drops into both eyes 3 (three) times daily as needed (dry eyes).    [provider]      Allergies    Actos [pioglitazone], Formaldehyde, Lipitor [atorvastatin calcium], and Metformin and related    Review of Systems   Review of Systems  Neurological:  Positive for weakness.    Physical Exam Updated Vital Signs BP (!) 180/63   Pulse (!) 26   Temp 97.7 F (36.5 C) (Oral)   Resp (!) 25   Ht 5\' 8"  (1.727 m)   Wt 81.6 kg   SpO2 100%   BMI 27.37 kg/m  Physical Exam  ED Results /  Procedures / Treatments   Labs (all labs ordered are listed, but only abnormal results are displayed) Labs Reviewed  CBC WITH DIFFERENTIAL/PLATELET - Abnormal; Notable for the following components:      Result Value   RBC 3.84 (*)    Hemoglobin 11.8 (*)    HCT 36.1 (*)    All other components within normal limits  BASIC METABOLIC PANEL - Abnormal; Notable for the following components:   Chloride 97 (*)    Glucose, Bld 282 (*)    All other components within normal limits    EKG None  Radiology DG Chest Port 1 View  Result Date: 03/12/2022 CLINICAL DATA:  Short of breath EXAM: PORTABLE CHEST 1 VIEW COMPARISON:  03/10/2022 FINDINGS: Heart size and vascularity normal. Mild  bibasilar airspace disease left greater than right. Mild interval improvement in bibasilar airspace disease. Small pleural effusions. IMPRESSION: Mild improvement in bibasilar airspace disease. Small bilateral effusions unchanged. Negative for pulmonary edema. Electronically Signed   By: Marlan Palau M.D.   On: 03/12/2022 19:15    Procedures Procedures  {Document cardiac monitor, telemetry assessment procedure when appropriate:1}  Medications Ordered in ED Medications - No data to display  ED Course/ Medical Decision Making/ A&P                           Medical Decision Making Amount and/or Complexity of Data Reviewed Labs: ordered. Radiology: ordered. ECG/medicine tests: ordered.   Patient stable with healing pneumonia.  He will be discharged back home  {Document critical care time when appropriate:1} {Document review of labs and clinical decision tools ie heart score, Chads2Vasc2 etc:1}  {Document your independent review of radiology images, and any outside records:1} {Document your discussion with family members, caretakers, and with consultants:1} {Document social determinants of health affecting pt's care:1} {Document your decision making why or why not admission, treatments were needed:1} Final Clinical Impression(s) / ED Diagnoses Final diagnoses:  Community acquired pneumonia, unspecified laterality    Rx / DC Orders ED Discharge Orders     None

## 2022-03-12 NOTE — ED Notes (Signed)
Pt son called and made aware of discharge. Son's eta is one hour before pt pickup.

## 2022-03-12 NOTE — ED Triage Notes (Signed)
Pt BIB EMS from home. Patient recently diagnosed with rhinovirus which turned into pneumonia. Family given antibiotic. Family states antibiotics are not working and are causing altered mental status and worsening symptoms.   Vitals were:  130/70 60-Hr 18-RR 98% on 2L 285-CBG

## 2022-03-12 NOTE — ED Notes (Signed)
Pt was provided perineal care with soap and warm water. New brief was applied, repositioned to comfort.

## 2022-03-12 NOTE — Discharge Instructions (Signed)
Continue taking the medicines they discharged you on yesterday and follow-up stay instructed yesterday

## 2022-03-16 DIAGNOSIS — J189 Pneumonia, unspecified organism: Secondary | ICD-10-CM | POA: Diagnosis not present

## 2022-05-01 ENCOUNTER — Encounter: Payer: Self-pay | Admitting: Podiatrist

## 2022-05-01 ENCOUNTER — Ambulatory Visit (INDEPENDENT_AMBULATORY_CARE_PROVIDER_SITE_OTHER): Payer: Medicare (Managed Care) | Admitting: Podiatrist

## 2022-05-01 DIAGNOSIS — E1169 Type 2 diabetes mellitus with other specified complication: Secondary | ICD-10-CM | POA: Diagnosis not present

## 2022-05-01 DIAGNOSIS — B351 Tinea unguium: Secondary | ICD-10-CM | POA: Diagnosis not present

## 2022-05-01 DIAGNOSIS — E1142 Type 2 diabetes mellitus with diabetic polyneuropathy: Secondary | ICD-10-CM | POA: Diagnosis not present

## 2022-05-01 NOTE — Progress Notes (Signed)
Subjective: Jackson [name] is a 88 y.o. male patient with history of diabetes who presents to office today with a chief concern of long,mildly painful nails  while ambulating in shoes; unable to trim.  Patient relates he has neuropathy and states he noticed it more after he had a hip replacement.  He presents today with his Niece-  His wife, Jackson [name], used to trim his nails for him.  Sadly she passed away about 6 weeks ago.  He denies any other pedal concerns.    Jackson Noble, MD  is his primary care provider.  Patient Active Problem List   Diagnosis Date Noted   CHB (complete heart block) (HCC)    Rhinovirus infection 03/07/2022   Hyponatremia 03/07/2022   Pneumonia 03/07/2022   Fever 03/06/2022   Elevated serum creatinine 03/06/2022   DNR (do not resuscitate) 03/06/2022   Demand ischemia (HCC)    Elevated troponin 06/28/2020   Sepsis (HCC) 06/27/2020   Aspiration into airway 01/22/2019   Essential hypertension 11/18/2018   Dyslipidemia 11/18/2018   Chronic diastolic CHF (congestive heart failure) (HCC) 11/18/2018   Status post total replacement of left hip 09/24/2017   Pain in left hip 08/11/2017   Unilateral primary osteoarthritis, left hip 08/11/2017   Complete heart block (HCC) 12/16/2016   Atherosclerosis of aorta (HCC) 12/16/2016   Aortic stenosis    Severe sepsis (HCC) 02/25/2015   Bilateral pneumonia 02/25/2015   Type 2 diabetes mellitus (HCC) 02/25/2015   Lactic acidosis 02/25/2015   Respiratory failure, acute (HCC) 02/25/2015   Sepsis due to pneumonia (HCC) 02/25/2015   Acute on chronic respiratory failure with hypoxia (HCC)    CAP (community acquired pneumonia)    SOB (shortness of breath)    Type 2 diabetes mellitus with hyperglycemia (HCC)    Current Outpatient Medications on File Prior to Visit  Medication Sig Dispense Refill   albuterol (PROVENTIL) (2.5 MG/3ML) 0.083% nebulizer solution Take 2.5 mg by nebulization every 6 (six) hours as needed for wheezing or  shortness of breath.     aspirin EC 81 MG tablet Take 81 mg by mouth daily.     Calcium Carbonate-Vitamin D (CALCIUM 600+D PO) Take 1 tablet by mouth daily.     docusate sodium (COLACE) 100 MG capsule Take 100 mg by mouth at bedtime.     fluticasone (FLONASE) 50 MCG/ACT nasal spray Place 2 sprays into both nostrils 2 (two) times daily. 16 g 0   gabapentin (NEURONTIN) 100 MG capsule Take 300 mg by mouth at bedtime.      insulin aspart (NOVOLOG FLEXPEN) 100 UNIT/ML FlexPen Before each meal 3 times a day, 140-199 - 2 units, 200-250 - 4 units, 251-299 - 6 units,  300-349 - 8 units,  350 or above 10 units. (Patient taking differently: Inject 2-10 Units into the skin See admin instructions. Per sliding scale before each meal 3 times a day, 140-199 = 2 units, 200-250 =4 units, 251-299 =6 units,  300-349 = 8 units,  350 or above 10 units.) 15 mL 11   Insulin Glargine (LANTUS) 100 UNIT/ML Solostar Pen Inject 15 Units into the skin daily at 10 pm.     lisinopril (PRINIVIL,ZESTRIL) 5 MG tablet Take 1 tablet (5 mg total) by mouth daily. 90 tablet 3   omeprazole (PRILOSEC) 20 MG capsule Take 20 mg by mouth daily.     rosuvastatin (CRESTOR) 10 MG tablet TAKE 1 TABLET AT BEDTIME (Patient taking differently: Take 10 mg by mouth at bedtime.) 90 tablet  1   sodium chloride (OCEAN) 0.65 % SOLN nasal spray Place 1 spray into both nostrils as needed for congestion. 15 mL 0   tetrahydrozoline-zinc (VISINE-AC) 0.05-0.25 % ophthalmic solution Place 2 drops into both eyes 3 (three) times daily as needed (dry eyes).     No current facility-administered medications on file prior to visit.   Allergies  Allergen Reactions   Actos [Pioglitazone] Swelling   Formaldehyde Other (See Comments)    Flu-like symptoms   Lipitor [Atorvastatin Calcium] Other (See Comments)    unknown   Metformin And Related Other (See Comments)    unknown      Objective: General: Patient is awake, alert, and oriented x 3 and in no acute  distress.  Integument: Skin is warm, dry and supple bilateral. Nails are tender, long, thickened and  dystrophic with subungual debris, consistent with onychomycosis, 1-5 bilateral. No signs of infection. No open lesions or preulcerative lesions present bilateral. Remaining integument unremarkable.  Vasculature:  Dorsalis Pedis pulse /4 bilateral. Posterior Tibial pulse  1/4 bilateral.  Capillary fill time <3 sec 1-5 bilateral. Temperature gradient within normal limits. No varicosities present bilateral. No edema present bilateral.   Neurology: The patient has decreased sensation measured with a 5.07/10g Semmes Weinstein Monofilament at  1/5 pedal sites bilateral . Vibratory sensation diminished bilateral with tuning fork. No Babinski sign present bilateral.   Musculoskeletal: No symptomatic pedal deformities noted bilateral. Muscular strength 5/5 in all lower extremity muscular groups bilateral without pain on range of motion . No tenderness with calf compression bilateral.  Assessment and Plan:   ICD-10-CM   1. Onychomycosis of multiple toenails with type 2 diabetes mellitus and peripheral neuropathy (HCC)  E11.42    B35.1    E11.69        -Examined patient. -Mechanically debrided all nails 1-5 bilateral using sterile nail nipper and filed with dremel without incident  -Answered all patient questions -Patient to return  in 3 months for at risk foot care -Patient advised to call the office if any problems or questions arise in the meantime.  Delories Heinz, DPM

## 2022-05-01 NOTE — Patient Instructions (Signed)
Diabetes Mellitus and Foot Care Foot care is an important part of your health, especially when you have diabetes. Diabetes may cause you to have problems because of poor blood flow (circulation) to your feet and legs, which can cause your skin to: Become thinner and drier. Break more easily. Heal more slowly. Peel and crack. You may also have nerve damage (neuropathy) in your legs and feet, causing decreased feeling in them. This means that you may not notice minor injuries to your feet that could lead to more serious problems. Noticing and addressing any potential problems early is the best way to prevent future foot problems. How to care for your feet Foot hygiene  Wash your feet daily with warm water and mild soap. Do not use hot water. Then, pat your feet and the areas between your toes until they are completely dry. Do not soak your feet as this can dry your skin.  Do not dig under the toenails or around the cuticle. File the edges of your nails with an emery board or nail file. Apply a moisturizing lotion or petroleum jelly to the skin on your feet and to dry, brittle toenails. Use lotion that does not contain alcohol and is unscented. Do not apply lotion between your toes. Shoes and socks Wear clean socks or stockings every day. Make sure they are not too tight. Do not wear knee-high stockings since they may decrease blood flow to your legs. Wear shoes that fit properly and have enough cushioning. Always look in your shoes before you put them on to be sure there are no objects inside. To break in new shoes, wear them for just a few hours a day. This prevents injuries on your feet. Wounds, scrapes, corns, and calluses  Check your feet daily for blisters, cuts, bruises, sores, and redness. If you cannot see the bottom of your feet, use a mirror or ask someone for help. Do not cut corns or calluses or try to remove them with medicine. If you find a minor scrape, cut, or break in the skin on  your feet, keep it and the skin around it clean and dry. You may clean these areas with mild soap and water. Do not clean the area with peroxide, alcohol, or iodine. If you have a wound, scrape, corn, or callus on your foot, look at it several times a day to make sure it is healing and not infected. Check for: Redness, swelling, or pain. Fluid or blood. Warmth. Pus or a bad smell. General tips Do not cross your legs. This may decrease blood flow to your feet. Do not use heating pads or hot water bottles on your feet. They may burn your skin. If you have lost feeling in your feet or legs, you may not know this is happening until it is too late. Protect your feet from hot and cold by wearing shoes, such as at the beach or on hot pavement. Schedule a complete foot exam at least once a year (annually) or more often if you have foot problems. Report any cuts, sores, or bruises to your health care provider immediately. Where to find more information American Diabetes Association: www.diabetes.org Association of Diabetes Care & Education Specialists: www.diabeteseducator.org Contact a health care provider if: You have a medical condition that increases your risk of infection and you have any cuts, sores, or bruises on your feet. You have an injury that is not healing. You have redness on your legs or feet. You feel burning or  tingling in your legs or feet. You have pain or cramps in your legs and feet. Your legs or feet are numb. Your feet always feel cold. You have pain around any toenails. Get help right away if: You have a wound, scrape, corn, or callus on your foot and: You have pain, swelling, or redness that gets worse. You have fluid or blood coming from the wound, scrape, corn, or callus. Your wound, scrape, corn, or callus feels warm to the touch. You have pus or a bad smell coming from the wound, scrape, corn, or callus. You have a fever. You have a red line going up your  leg. Summary Check your feet every day for blisters, cuts, bruises, sores, and redness. Apply a moisturizing lotion or petroleum jelly to the skin on your feet and to dry, brittle toenails. Wear shoes that fit properly and have enough cushioning. If you have foot problems, report any cuts, sores, or bruises to your health care provider immediately. Schedule a complete foot exam at least once a year (annually) or more often if you have foot problems. This information is not intended to replace advice given to you by your health care provider. Make sure you discuss any questions you have with your health care provider. Document Revised: 03/28/2020 Document Reviewed: 03/28/2020 Elsevier Patient Education  2023 [employer].

## 2022-07-02 ENCOUNTER — Encounter: Payer: Self-pay | Admitting: Podiatry

## 2022-07-02 ENCOUNTER — Ambulatory Visit (INDEPENDENT_AMBULATORY_CARE_PROVIDER_SITE_OTHER): Payer: Medicare (Managed Care) | Admitting: Podiatry

## 2022-07-02 DIAGNOSIS — B351 Tinea unguium: Secondary | ICD-10-CM

## 2022-07-02 DIAGNOSIS — M79676 Pain in unspecified toe(s): Secondary | ICD-10-CM | POA: Diagnosis not present

## 2022-07-02 NOTE — Progress Notes (Signed)
Subjective:  Patient ID: Jackson [name], male    DOB: Jul 30, 1929,  MRN: [id number] HPI Chief Complaint  Patient presents with   Debridement    Trim toenails/wants to discuss options for neuropathy    88 y.o. male presents with the above complaint.   ROS: Denies fever chills nausea vomiting muscle aches pains calf pain back pain chest pain shortness of breath.  Past Medical History:  Diagnosis Date   Aortic stenosis    Echo 3/18: mild aortic stenosis (mean 12/peak 22 // Echo 3/19: mild AS (mean 12, peak 20) // Echo 02/2019: EF 37-04, grade 1 diastolic dysfunction, mild MAC, mild aortic stenosis (mean gradient 9), mild dilation of the ascending aorta (38 mm)    Atherosclerosis of aorta ([place]) 12/16/2016   CXR 6/16: IMPRESSION: 1. Resolved left basilar airspace opacities. 2. Hazy nodularity in both lungs compatible with the patient's known pleural plaques. 3. Right anterior hemidiaphragmatic eventration. 4. Thoracic spondylosis. 5. Atherosclerotic calcification of the aortic arch.   Bradycardia    Low HR (30s) noted during sleep during admx for sepsis in 2016; Mobitz 1, 2:1 block >> no indication for pacer; management of OSA recommended // transient CHB noted on ECG during admit in 10/2018    Diabetes mellitus without complication (HCC)    Diabetic neuropathy (HCC)    DJD (degenerative joint disease)    knee, hip   GERD (gastroesophageal reflux disease)    HLD (hyperlipidemia)    Hypertension    Hyperthyroidism    Lexiscan Myoview 06/2018   Normal perfusion, EF 81; Low Risk   Mild diastolic dysfunction 88/89/1694   Noted on Echocardiogram; Patient does not require diuretic Rx   Phlebitis of left leg    Past Surgical History:  Procedure Laterality Date   CATARACT EXTRACTION Right    CIRCUMCISION     FRACTURE SURGERY     TOTAL HIP ARTHROPLASTY Left 09/24/2017   Procedure: LEFT TOTAL HIP ARTHROPLASTY ANTERIOR APPROACH;  Surgeon: Mcarthur Rossetti, MD;  Location: [facility];   Service: Orthopedics;  Laterality: Left;    Current Outpatient Medications:    albuterol (PROVENTIL) (2.5 MG/3ML) 0.083% nebulizer solution, Take 2.5 mg by nebulization every 6 (six) hours as needed for wheezing or shortness of breath., Disp: , Rfl:    aspirin EC 81 MG tablet, Take 81 mg by mouth daily., Disp: , Rfl:    Calcium Carbonate-Vitamin D (CALCIUM 600+D PO), Take 1 tablet by mouth daily., Disp: , Rfl:    docusate sodium (COLACE) 100 MG capsule, Take 100 mg by mouth at bedtime., Disp: , Rfl:    fluticasone (FLONASE) 50 MCG/ACT nasal spray, Place 2 sprays into both nostrils 2 (two) times daily., Disp: 16 g, Rfl: 0   gabapentin (NEURONTIN) 100 MG capsule, Take 300 mg by mouth at bedtime. , Disp: , Rfl:    insulin aspart (NOVOLOG FLEXPEN) 100 UNIT/ML FlexPen, Before each meal 3 times a day, 140-199 - 2 units, 200-250 - 4 units, 251-299 - 6 units,  300-349 - 8 units,  350 or above 10 units. (Patient taking differently: Inject 2-10 Units into the skin See admin instructions. Per sliding scale before each meal 3 times a day, 140-199 = 2 units, 200-250 =4 units, 251-299 =6 units,  300-349 = 8 units,  350 or above 10 units.), Disp: 15 mL, Rfl: 11   Insulin Glargine (LANTUS) 100 UNIT/ML Solostar Pen, Inject 15 Units into the skin daily at 10 pm., Disp: , Rfl:    lisinopril (PRINIVIL,ZESTRIL)  5 MG tablet, Take 1 tablet (5 mg total) by mouth daily., Disp: 90 tablet, Rfl: 3   omeprazole (PRILOSEC) 20 MG capsule, Take 20 mg by mouth daily., Disp: , Rfl:    rosuvastatin (CRESTOR) 10 MG tablet, TAKE 1 TABLET AT BEDTIME (Patient taking differently: Take 10 mg by mouth at bedtime.), Disp: 90 tablet, Rfl: 1   sodium chloride (OCEAN) 0.65 % SOLN nasal spray, Place 1 spray into both nostrils as needed for congestion., Disp: 15 mL, Rfl: 0   tetrahydrozoline-zinc (VISINE-AC) 0.05-0.25 % ophthalmic solution, Place 2 drops into both eyes 3 (three) times daily as needed (dry eyes)., Disp: , Rfl:   Allergies   Allergen Reactions   Actos [Pioglitazone] Swelling   Formaldehyde Other (See Comments)    Flu-like symptoms   Lipitor [Atorvastatin Calcium] Other (See Comments)    unknown   Metformin And Related Other (See Comments)    unknown   Review of Systems Objective:  There were no vitals filed for this visit.  General: Well developed, nourished, in no acute distress, alert and oriented x3   Dermatological: Skin is warm, dry and supple bilateral. Nails x 10 are well maintained; remaining integument appears unremarkable at this time. There are no open sores, no preulcerative lesions, no rash or signs of infection present.  Vascular: Dorsalis Pedis artery and Posterior Tibial artery pedal pulses are 2/4 bilateral with immedate capillary fill time. Pedal hair growth present. No varicosities and no lower extremity edema present bilateral.   Neruologic: Grossly intact via light touch bilateral. Vibratory intact via tuning fork bilateral. Protective threshold with Semmes Wienstein monofilament intact to all pedal sites bilateral. Patellar and Achilles deep tendon reflexes 2+ bilateral. No Babinski or clonus noted bilateral.   Musculoskeletal: No gross boney pedal deformities bilateral. No pain, crepitus, or limitation noted with foot and ankle range of motion bilateral. Muscular strength 5/5 in all groups tested bilateral.  Gait: Unassisted, Nonantalgic.    Radiographs:  None taken  Assessment & Plan:   Assessment: Thick nail dystrophy hallux bilateral and lesser nails.  Neuropathy.  Plan: Debridement of nails bilateral.     Jackson [name] T. [place], [place]

## 2022-07-08 DIAGNOSIS — N1831 Chronic kidney disease, stage 3a: Secondary | ICD-10-CM | POA: Diagnosis not present

## 2022-07-08 DIAGNOSIS — R030 Elevated blood-pressure reading, without diagnosis of hypertension: Secondary | ICD-10-CM | POA: Diagnosis not present

## 2022-07-08 DIAGNOSIS — E113311 Type 2 diabetes mellitus with moderate nonproliferative diabetic retinopathy with macular edema, right eye: Secondary | ICD-10-CM | POA: Diagnosis not present

## 2022-07-08 DIAGNOSIS — E1169 Type 2 diabetes mellitus with other specified complication: Secondary | ICD-10-CM | POA: Diagnosis not present

## 2022-07-08 DIAGNOSIS — Z23 Encounter for immunization: Secondary | ICD-10-CM | POA: Diagnosis not present

## 2022-07-08 DIAGNOSIS — Z794 Long term (current) use of insulin: Secondary | ICD-10-CM | POA: Diagnosis not present

## 2022-07-08 DIAGNOSIS — E113412 Type 2 diabetes mellitus with severe nonproliferative diabetic retinopathy with macular edema, left eye: Secondary | ICD-10-CM | POA: Diagnosis not present

## 2022-07-08 DIAGNOSIS — E1140 Type 2 diabetes mellitus with diabetic neuropathy, unspecified: Secondary | ICD-10-CM | POA: Diagnosis not present

## 2022-08-11 DIAGNOSIS — N1831 Chronic kidney disease, stage 3a: Secondary | ICD-10-CM | POA: Diagnosis not present

## 2022-08-11 DIAGNOSIS — M1990 Unspecified osteoarthritis, unspecified site: Secondary | ICD-10-CM | POA: Diagnosis not present

## 2022-08-11 DIAGNOSIS — E1140 Type 2 diabetes mellitus with diabetic neuropathy, unspecified: Secondary | ICD-10-CM | POA: Diagnosis not present

## 2022-08-11 DIAGNOSIS — K219 Gastro-esophageal reflux disease without esophagitis: Secondary | ICD-10-CM | POA: Diagnosis not present

## 2022-10-06 ENCOUNTER — Encounter: Payer: Self-pay | Admitting: Podiatry

## 2022-10-06 ENCOUNTER — Ambulatory Visit (INDEPENDENT_AMBULATORY_CARE_PROVIDER_SITE_OTHER): Payer: Medicare (Managed Care) | Admitting: Podiatry

## 2022-10-06 VITALS — BP 165/63 | HR 72

## 2022-10-06 DIAGNOSIS — B351 Tinea unguium: Secondary | ICD-10-CM | POA: Diagnosis not present

## 2022-10-06 DIAGNOSIS — M79676 Pain in unspecified toe(s): Secondary | ICD-10-CM | POA: Diagnosis not present

## 2022-10-06 NOTE — Progress Notes (Signed)
He presents today chief complaint of painful elongated toenails with a painful callus to the fifth digit of the right foot.  Objective: Vital signs stable alert and oriented x 3.  Pulses are palpable.  He has a benign hyperkeratotic lesion to the medial aspect fifth digit of the right foot exquisitely painful on palpation.  Otherwise the toenails are long and dystrophic.  No open lesions or wounds.  Assessment: Nail dystrophy is bilateral.  Painful lesion fifth medial right.  Plan: Discussed etiology pathology and surgical therapies debrided all benign skin lesions and debrided nails 1 through 5 bilateral

## 2022-10-15 DIAGNOSIS — E1140 Type 2 diabetes mellitus with diabetic neuropathy, unspecified: Secondary | ICD-10-CM | POA: Diagnosis not present

## 2022-10-15 DIAGNOSIS — N1831 Chronic kidney disease, stage 3a: Secondary | ICD-10-CM | POA: Diagnosis not present

## 2022-10-15 DIAGNOSIS — M1990 Unspecified osteoarthritis, unspecified site: Secondary | ICD-10-CM | POA: Diagnosis not present

## 2022-10-15 DIAGNOSIS — K219 Gastro-esophageal reflux disease without esophagitis: Secondary | ICD-10-CM | POA: Diagnosis not present

## 2022-10-15 DIAGNOSIS — I10 Essential (primary) hypertension: Secondary | ICD-10-CM | POA: Diagnosis not present

## 2022-10-19 DIAGNOSIS — Z79899 Other long term (current) drug therapy: Secondary | ICD-10-CM | POA: Diagnosis not present

## 2022-10-19 DIAGNOSIS — E1165 Type 2 diabetes mellitus with hyperglycemia: Secondary | ICD-10-CM | POA: Diagnosis not present

## 2022-10-19 DIAGNOSIS — Z1331 Encounter for screening for depression: Secondary | ICD-10-CM | POA: Diagnosis not present

## 2022-10-19 DIAGNOSIS — I700 Atherosclerosis of aorta: Secondary | ICD-10-CM | POA: Diagnosis not present

## 2022-10-19 DIAGNOSIS — E559 Vitamin D deficiency, unspecified: Secondary | ICD-10-CM | POA: Diagnosis not present

## 2022-10-19 DIAGNOSIS — Z794 Long term (current) use of insulin: Secondary | ICD-10-CM | POA: Diagnosis not present

## 2022-10-19 DIAGNOSIS — N1831 Chronic kidney disease, stage 3a: Secondary | ICD-10-CM | POA: Diagnosis not present

## 2022-10-19 DIAGNOSIS — K219 Gastro-esophageal reflux disease without esophagitis: Secondary | ICD-10-CM | POA: Diagnosis not present

## 2022-10-19 DIAGNOSIS — I10 Essential (primary) hypertension: Secondary | ICD-10-CM | POA: Diagnosis not present

## 2022-10-19 DIAGNOSIS — R351 Nocturia: Secondary | ICD-10-CM | POA: Diagnosis not present

## 2022-10-19 DIAGNOSIS — Z0000 Encounter for general adult medical examination without abnormal findings: Secondary | ICD-10-CM | POA: Diagnosis not present

## 2022-10-19 DIAGNOSIS — G4737 Central sleep apnea in conditions classified elsewhere: Secondary | ICD-10-CM | POA: Diagnosis not present

## 2022-10-19 DIAGNOSIS — E1169 Type 2 diabetes mellitus with other specified complication: Secondary | ICD-10-CM | POA: Diagnosis not present

## 2022-10-19 DIAGNOSIS — R001 Bradycardia, unspecified: Secondary | ICD-10-CM | POA: Diagnosis not present

## 2022-10-19 DIAGNOSIS — R0981 Nasal congestion: Secondary | ICD-10-CM | POA: Diagnosis not present

## 2022-11-03 DIAGNOSIS — D649 Anemia, unspecified: Secondary | ICD-10-CM | POA: Diagnosis not present

## 2022-11-11 DIAGNOSIS — J189 Pneumonia, unspecified organism: Secondary | ICD-10-CM | POA: Diagnosis not present

## 2022-11-11 DIAGNOSIS — J449 Chronic obstructive pulmonary disease, unspecified: Secondary | ICD-10-CM | POA: Diagnosis not present

## 2022-12-10 DIAGNOSIS — J189 Pneumonia, unspecified organism: Secondary | ICD-10-CM | POA: Diagnosis not present

## 2022-12-10 DIAGNOSIS — J449 Chronic obstructive pulmonary disease, unspecified: Secondary | ICD-10-CM | POA: Diagnosis not present

## 2022-12-22 DIAGNOSIS — E871 Hypo-osmolality and hyponatremia: Secondary | ICD-10-CM | POA: Diagnosis not present

## 2023-01-05 ENCOUNTER — Ambulatory Visit (INDEPENDENT_AMBULATORY_CARE_PROVIDER_SITE_OTHER): Payer: Medicare (Managed Care) | Admitting: Podiatry

## 2023-01-05 DIAGNOSIS — B351 Tinea unguium: Secondary | ICD-10-CM | POA: Diagnosis not present

## 2023-01-05 DIAGNOSIS — M79676 Pain in unspecified toe(s): Secondary | ICD-10-CM

## 2023-01-09 ENCOUNTER — Encounter: Payer: Self-pay | Admitting: Podiatry

## 2023-01-09 NOTE — Progress Notes (Signed)
  Subjective:  Patient ID: Jackson [name], male    DOB: 12-16-1928,  MRN: [id number]  Jackson [name] presents to clinic today for for annual diabetic foot examination  Chief Complaint  Patient presents with   Nail Problem    Jackson [name] BS-do not check anymore A1C-7.0 PCP-[facility] PCP VST-1 month ago   New problem(s): None.   PCP is Emilio Aspen, MD.  Allergies  Allergen Reactions   Actos [Pioglitazone] Swelling   Formaldehyde Other (See Comments)    Flu-like symptoms   Lipitor [Atorvastatin Calcium] Other (See Comments)    unknown   Metformin And Related Other (See Comments)    unknown    Review of Systems: Negative except as noted in the HPI.  Objective: No changes noted in today's physical examination. There were no vitals filed for this visit. Jackson [name] is a pleasant 89 y.o. male in NAD. AAO x 3.  Vascular Examination: Capillary refill time immediate b/l. Vascular status intact b/l with palpable pedal pulses. No edema. No pain with calf compression b/l. Skin temperature gradient WNL b/l. Pedal hair sparse. No cyanosis or clubbing noted b/l LE.  Neurological Examination: Sensation grossly intact b/l with 10 gram monofilament. Vibratory sensation intact b/l.   Dermatological Examination: Pedal skin with normal turgor, texture and tone b/l.  No open wounds. No interdigital macerations.   Toenails 1-5 b/l thick, discolored, elongated with subungual debris and pain on dorsal palpation.   No hyperkeratotic nor porokeratotic lesions present on today's visit.  Musculoskeletal Examination: Muscle strength 5/5 to all lower extremity muscle groups bilaterally. No pain, crepitus or joint limitation noted with ROM bilateral LE.  Radiographs: None  Assessment/Plan: 1. Pain due to onychomycosis of toenail     -Patient was evaluated and treated. All patient's and/or POA's questions/concerns answered on today's visit. -Diabetic foot examination performed  today. -Patient to continue soft, supportive shoe gear daily. -Toenails 1-5 b/l were debrided in length and girth with sterile nail nippers and dremel without iatrogenic bleeding.  -Patient/POA to call should there be question/concern in the interim.   Return in about 3 months (around 04/06/2023).  Freddie Breech, DPM

## 2023-03-12 DIAGNOSIS — J189 Pneumonia, unspecified organism: Secondary | ICD-10-CM | POA: Diagnosis not present

## 2023-03-12 DIAGNOSIS — J449 Chronic obstructive pulmonary disease, unspecified: Secondary | ICD-10-CM | POA: Diagnosis not present

## 2023-04-11 DIAGNOSIS — J189 Pneumonia, unspecified organism: Secondary | ICD-10-CM | POA: Diagnosis not present

## 2023-04-11 DIAGNOSIS — J449 Chronic obstructive pulmonary disease, unspecified: Secondary | ICD-10-CM | POA: Diagnosis not present

## 2023-04-19 DIAGNOSIS — R351 Nocturia: Secondary | ICD-10-CM | POA: Diagnosis not present

## 2023-04-19 DIAGNOSIS — I77810 Thoracic aortic ectasia: Secondary | ICD-10-CM | POA: Diagnosis not present

## 2023-04-19 DIAGNOSIS — R001 Bradycardia, unspecified: Secondary | ICD-10-CM | POA: Diagnosis not present

## 2023-04-19 DIAGNOSIS — E1165 Type 2 diabetes mellitus with hyperglycemia: Secondary | ICD-10-CM | POA: Diagnosis not present

## 2023-04-19 DIAGNOSIS — G4737 Central sleep apnea in conditions classified elsewhere: Secondary | ICD-10-CM | POA: Diagnosis not present

## 2023-04-19 DIAGNOSIS — N1831 Chronic kidney disease, stage 3a: Secondary | ICD-10-CM | POA: Diagnosis not present

## 2023-04-19 DIAGNOSIS — Z794 Long term (current) use of insulin: Secondary | ICD-10-CM | POA: Diagnosis not present

## 2023-04-19 DIAGNOSIS — E559 Vitamin D deficiency, unspecified: Secondary | ICD-10-CM | POA: Diagnosis not present

## 2023-04-19 DIAGNOSIS — I10 Essential (primary) hypertension: Secondary | ICD-10-CM | POA: Diagnosis not present

## 2023-04-19 DIAGNOSIS — K219 Gastro-esophageal reflux disease without esophagitis: Secondary | ICD-10-CM | POA: Diagnosis not present

## 2023-04-19 DIAGNOSIS — I700 Atherosclerosis of aorta: Secondary | ICD-10-CM | POA: Diagnosis not present

## 2023-04-19 DIAGNOSIS — D509 Iron deficiency anemia, unspecified: Secondary | ICD-10-CM | POA: Diagnosis not present

## 2023-05-12 DIAGNOSIS — J189 Pneumonia, unspecified organism: Secondary | ICD-10-CM | POA: Diagnosis not present

## 2023-05-12 DIAGNOSIS — J449 Chronic obstructive pulmonary disease, unspecified: Secondary | ICD-10-CM | POA: Diagnosis not present

## 2023-05-19 ENCOUNTER — Encounter: Payer: Self-pay | Admitting: Podiatry

## 2023-05-19 ENCOUNTER — Ambulatory Visit (INDEPENDENT_AMBULATORY_CARE_PROVIDER_SITE_OTHER): Payer: Medicare (Managed Care) | Admitting: Podiatry

## 2023-05-19 DIAGNOSIS — E119 Type 2 diabetes mellitus without complications: Secondary | ICD-10-CM | POA: Diagnosis not present

## 2023-05-19 DIAGNOSIS — Z794 Long term (current) use of insulin: Secondary | ICD-10-CM

## 2023-05-19 DIAGNOSIS — B351 Tinea unguium: Secondary | ICD-10-CM

## 2023-05-19 DIAGNOSIS — Q828 Other specified congenital malformations of skin: Secondary | ICD-10-CM

## 2023-05-19 DIAGNOSIS — M79676 Pain in unspecified toe(s): Secondary | ICD-10-CM | POA: Diagnosis not present

## 2023-05-22 NOTE — Progress Notes (Signed)
  Subjective:  Patient ID: Jackson [name], male    DOB: 08/01/1929,  MRN: [id number]  Jackson [name] presents to clinic today for preventative diabetic foot care and painful thick toenails that are difficult to trim. Pain interferes with ambulation. Aggravating factors include wearing enclosed shoe gear. Pain is relieved with periodic professional debridement. He relates pain of the right 5th toe due to lesion between toes. Chief Complaint  Patient presents with   Nail Problem    DFC,A1C:8.0,Referring Provider Jackson Neighbor A, MD,lov:08/24,BS:138      New problem(s): None.   PCP is Jackson Aspen, MD.  Allergies  Allergen Reactions   Actos [Pioglitazone] Swelling   Formaldehyde Other (See Comments)    Flu-like symptoms   Lipitor [Atorvastatin Calcium] Other (See Comments)    unknown   Metformin And Related Other (See Comments)    unknown    Review of Systems: Negative except as noted in the HPI.  Objective:  There were no vitals filed for this visit. Jackson [name] is [name] pleasant 89 y.o. male WD, WN in NAD. AAO x 3.  Vascular Examination: Capillary refill time immediate b/l. Vascular status intact b/l with palpable pedal pulses. No edema. No pain with calf compression b/l. Skin temperature gradient WNL b/l. Pedal hair sparse. No cyanosis or clubbing noted b/l LE.  Neurological Examination: Sensation grossly intact b/l with 10 gram monofilament. Vibratory sensation intact b/l.   Dermatological Examination: Pedal skin with normal turgor, texture and tone b/l.  No open wounds. No interdigital macerations.   Toenails 1-5 b/l thick, discolored, elongated with subungual debris and pain on dorsal palpation.   Porokeratotic lesion(s) R 5th toe. No erythema, no edema, no drainage, no fluctuance.  Musculoskeletal Examination: Muscle strength 5/5 to all lower extremity muscle groups bilaterally. No pain, crepitus or joint limitation noted with ROM bilateral  LE.  Radiographs: None Assessment/Plan: 1. Pain due to onychomycosis of toenail   2. Porokeratosis   3. Type 2 diabetes mellitus without complication, with long-term current use of insulin (HCC)     -Consent given for treatment as described below: -Examined patient. -Continue foot and shoe inspections daily. Monitor blood glucose per PCP/Endocrinologist's recommendations. -Continue supportive shoe gear daily. -Mycotic toenails 1-5 bilaterally were debrided in length and girth with sterile nail nippers and dremel without incident. -Porokeratotic lesion(s) R 5th toe pared and enucleated with sterile currette without incident. Total number of lesions debrided=1. -Patient/POA to call should there be question/concern in the interim.   Return in about 9 weeks (around 07/21/2023).  Jackson [name], DPM

## 2023-05-26 DIAGNOSIS — B079 Viral wart, unspecified: Secondary | ICD-10-CM | POA: Diagnosis not present

## 2023-06-01 DIAGNOSIS — L821 Other seborrheic keratosis: Secondary | ICD-10-CM | POA: Diagnosis not present

## 2023-06-12 DIAGNOSIS — J189 Pneumonia, unspecified organism: Secondary | ICD-10-CM | POA: Diagnosis not present

## 2023-06-12 DIAGNOSIS — J449 Chronic obstructive pulmonary disease, unspecified: Secondary | ICD-10-CM | POA: Diagnosis not present

## 2023-07-21 ENCOUNTER — Encounter: Payer: Self-pay | Admitting: Podiatry

## 2023-07-21 ENCOUNTER — Ambulatory Visit (INDEPENDENT_AMBULATORY_CARE_PROVIDER_SITE_OTHER): Payer: Medicare (Managed Care) | Admitting: Podiatry

## 2023-07-21 DIAGNOSIS — E119 Type 2 diabetes mellitus without complications: Secondary | ICD-10-CM

## 2023-07-21 DIAGNOSIS — M79676 Pain in unspecified toe(s): Secondary | ICD-10-CM | POA: Diagnosis not present

## 2023-07-21 DIAGNOSIS — B351 Tinea unguium: Secondary | ICD-10-CM | POA: Diagnosis not present

## 2023-07-21 DIAGNOSIS — Z794 Long term (current) use of insulin: Secondary | ICD-10-CM

## 2023-07-21 NOTE — Progress Notes (Signed)
This patient returns to my office for at risk foot care.  This patient requires this care by a professional since this patient will be at risk due to having Type 2 diabetes with neuropathy.  This patient is unable to cut nails himself since the patient cannot reach his nails.These nails are painful walking and wearing shoes.  This patient presents for at risk foot care today.  General Appearance  Alert, conversant and in no acute stress.  Vascular  Dorsalis pedis and posterior tibial  pulses are weakly  palpable  bilaterally.  Capillary return is within normal limits  bilaterally. Temperature is within normal limits  bilaterally.  Neurologic  Senn-Weinstein monofilament wire test within normal limits  bilaterally. Muscle power within normal limits bilaterally.  Nails Thick disfigured discolored nails with subungual debris  from hallux to fifth toes bilaterally. No evidence of bacterial infection or drainage bilaterally.  Orthopedic  No limitations of motion  feet .  No crepitus or effusions noted.  No bony pathology or digital deformities noted.  Skin  normotropic skin with no porokeratosis noted bilaterally.  No signs of infections or ulcers noted.     Onychomycosis  Pain in right toes  Pain in left toes  Consent was obtained for treatment procedures.   Mechanical debridement of nails 1-5  bilaterally performed with a nail nipper.  Filed with dremel without incident.    Return office visit    9 weeks                  Told patient to return for periodic foot care and evaluation due to potential at risk complications.   Helane Gunther DPM

## 2023-07-27 ENCOUNTER — Encounter (HOSPITAL_COMMUNITY): Payer: Self-pay | Admitting: Emergency Medicine

## 2023-07-27 ENCOUNTER — Emergency Department (HOSPITAL_COMMUNITY): Payer: Medicare (Managed Care)

## 2023-07-27 ENCOUNTER — Other Ambulatory Visit: Payer: Self-pay

## 2023-07-27 ENCOUNTER — Inpatient Hospital Stay (HOSPITAL_COMMUNITY)
Admission: EM | Admit: 2023-07-27 | Discharge: 2023-07-30 | DRG: 871 | Disposition: A | Discharge status: Home Health | Payer: Medicare (Managed Care) | Attending: Internal Medicine | Admitting: Internal Medicine

## 2023-07-27 DIAGNOSIS — Z7985 Long-term (current) use of injectable non-insulin antidiabetic drugs: Secondary | ICD-10-CM

## 2023-07-27 DIAGNOSIS — J90 Pleural effusion, not elsewhere classified: Secondary | ICD-10-CM | POA: Diagnosis not present

## 2023-07-27 DIAGNOSIS — J9819 Other pulmonary collapse: Secondary | ICD-10-CM | POA: Diagnosis not present

## 2023-07-27 DIAGNOSIS — K920 Hematemesis: Secondary | ICD-10-CM | POA: Diagnosis not present

## 2023-07-27 DIAGNOSIS — I700 Atherosclerosis of aorta: Secondary | ICD-10-CM | POA: Diagnosis present

## 2023-07-27 DIAGNOSIS — R0602 Shortness of breath: Secondary | ICD-10-CM | POA: Diagnosis not present

## 2023-07-27 DIAGNOSIS — Z833 Family history of diabetes mellitus: Secondary | ICD-10-CM | POA: Diagnosis not present

## 2023-07-27 DIAGNOSIS — E785 Hyperlipidemia, unspecified: Secondary | ICD-10-CM | POA: Diagnosis not present

## 2023-07-27 DIAGNOSIS — I959 Hypotension, unspecified: Secondary | ICD-10-CM | POA: Diagnosis not present

## 2023-07-27 DIAGNOSIS — R6520 Severe sepsis without septic shock: Secondary | ICD-10-CM | POA: Diagnosis not present

## 2023-07-27 DIAGNOSIS — Z8672 Personal history of thrombophlebitis: Secondary | ICD-10-CM

## 2023-07-27 DIAGNOSIS — Z888 Allergy status to other drugs, medicaments and biological substances status: Secondary | ICD-10-CM | POA: Diagnosis not present

## 2023-07-27 DIAGNOSIS — I10 Essential (primary) hypertension: Secondary | ICD-10-CM | POA: Diagnosis not present

## 2023-07-27 DIAGNOSIS — I350 Nonrheumatic aortic (valve) stenosis: Secondary | ICD-10-CM | POA: Diagnosis present

## 2023-07-27 DIAGNOSIS — Z79899 Other long term (current) drug therapy: Secondary | ICD-10-CM

## 2023-07-27 DIAGNOSIS — A419 Sepsis, unspecified organism: Principal | ICD-10-CM | POA: Diagnosis present

## 2023-07-27 DIAGNOSIS — J188 Other pneumonia, unspecified organism: Secondary | ICD-10-CM | POA: Diagnosis present

## 2023-07-27 DIAGNOSIS — J690 Pneumonitis due to inhalation of food and vomit: Secondary | ICD-10-CM | POA: Diagnosis present

## 2023-07-27 DIAGNOSIS — R001 Bradycardia, unspecified: Secondary | ICD-10-CM | POA: Diagnosis present

## 2023-07-27 DIAGNOSIS — Z794 Long term (current) use of insulin: Secondary | ICD-10-CM | POA: Diagnosis not present

## 2023-07-27 DIAGNOSIS — Z7982 Long term (current) use of aspirin: Secondary | ICD-10-CM

## 2023-07-27 DIAGNOSIS — R0902 Hypoxemia: Secondary | ICD-10-CM | POA: Diagnosis not present

## 2023-07-27 DIAGNOSIS — I5032 Chronic diastolic (congestive) heart failure: Secondary | ICD-10-CM | POA: Diagnosis not present

## 2023-07-27 DIAGNOSIS — Z1152 Encounter for screening for COVID-19: Secondary | ICD-10-CM

## 2023-07-27 DIAGNOSIS — E1140 Type 2 diabetes mellitus with diabetic neuropathy, unspecified: Secondary | ICD-10-CM | POA: Diagnosis not present

## 2023-07-27 DIAGNOSIS — Z66 Do not resuscitate: Secondary | ICD-10-CM | POA: Diagnosis present

## 2023-07-27 DIAGNOSIS — Z96642 Presence of left artificial hip joint: Secondary | ICD-10-CM | POA: Diagnosis not present

## 2023-07-27 DIAGNOSIS — J189 Pneumonia, unspecified organism: Secondary | ICD-10-CM | POA: Diagnosis present

## 2023-07-27 DIAGNOSIS — Z91048 Other nonmedicinal substance allergy status: Secondary | ICD-10-CM

## 2023-07-27 DIAGNOSIS — E0590 Thyrotoxicosis, unspecified without thyrotoxic crisis or storm: Secondary | ICD-10-CM | POA: Diagnosis present

## 2023-07-27 DIAGNOSIS — J984 Other disorders of lung: Secondary | ICD-10-CM | POA: Diagnosis not present

## 2023-07-27 DIAGNOSIS — E119 Type 2 diabetes mellitus without complications: Secondary | ICD-10-CM

## 2023-07-27 DIAGNOSIS — K219 Gastro-esophageal reflux disease without esophagitis: Secondary | ICD-10-CM | POA: Diagnosis not present

## 2023-07-27 DIAGNOSIS — Z87891 Personal history of nicotine dependence: Secondary | ICD-10-CM | POA: Diagnosis not present

## 2023-07-27 DIAGNOSIS — I110 Hypertensive heart disease with heart failure: Secondary | ICD-10-CM | POA: Diagnosis not present

## 2023-07-27 DIAGNOSIS — R042 Hemoptysis: Secondary | ICD-10-CM | POA: Diagnosis not present

## 2023-07-27 DIAGNOSIS — J9601 Acute respiratory failure with hypoxia: Secondary | ICD-10-CM | POA: Diagnosis not present

## 2023-07-27 DIAGNOSIS — I441 Atrioventricular block, second degree: Secondary | ICD-10-CM | POA: Diagnosis not present

## 2023-07-27 DIAGNOSIS — R918 Other nonspecific abnormal finding of lung field: Secondary | ICD-10-CM | POA: Diagnosis not present

## 2023-07-27 DIAGNOSIS — I442 Atrioventricular block, complete: Secondary | ICD-10-CM | POA: Diagnosis present

## 2023-07-27 LAB — CBC WITH DIFFERENTIAL/PLATELET
Abs Immature Granulocytes: 0.03 10*3/uL (ref 0.00–0.07)
Basophils Absolute: 0 10*3/uL (ref 0.0–0.1)
Basophils Relative: 0 %
Eosinophils Absolute: 0.1 10*3/uL (ref 0.0–0.5)
Eosinophils Relative: 1 %
HCT: 41.5 % (ref 39.0–52.0)
Hemoglobin: 13.9 g/dL (ref 13.0–17.0)
Immature Granulocytes: 0 %
Lymphocytes Relative: 6 %
Lymphs Abs: 0.7 10*3/uL (ref 0.7–4.0)
MCH: 31.2 pg (ref 26.0–34.0)
MCHC: 33.5 g/dL (ref 30.0–36.0)
MCV: 93.3 fL (ref 80.0–100.0)
Monocytes Absolute: 0.6 10*3/uL (ref 0.1–1.0)
Monocytes Relative: 5 %
Neutro Abs: 10.2 10*3/uL — ABNORMAL HIGH (ref 1.7–7.7)
Neutrophils Relative %: 88 %
Platelets: 277 10*3/uL (ref 150–400)
RBC: 4.45 MIL/uL (ref 4.22–5.81)
RDW: 12.5 % (ref 11.5–15.5)
WBC: 11.6 10*3/uL — ABNORMAL HIGH (ref 4.0–10.5)
nRBC: 0 % (ref 0.0–0.2)

## 2023-07-27 LAB — CBC
HCT: 40.8 % (ref 39.0–52.0)
HCT: 37.7 % — ABNORMAL LOW (ref 39.0–52.0)
Hemoglobin: 13.3 g/dL (ref 13.0–17.0)
Hemoglobin: 12.4 g/dL — ABNORMAL LOW (ref 13.0–17.0)
MCH: 30.2 pg (ref 26.0–34.0)
MCH: 30.7 pg (ref 26.0–34.0)
MCHC: 32.6 g/dL (ref 30.0–36.0)
MCHC: 32.9 g/dL (ref 30.0–36.0)
MCV: 92.7 fL (ref 80.0–100.0)
MCV: 93.3 fL (ref 80.0–100.0)
Platelets: 258 10*3/uL (ref 150–400)
Platelets: 255 10*3/uL (ref 150–400)
RBC: 4.4 MIL/uL (ref 4.22–5.81)
RBC: 4.04 MIL/uL — ABNORMAL LOW (ref 4.22–5.81)
RDW: 12.6 % (ref 11.5–15.5)
RDW: 12.7 % (ref 11.5–15.5)
WBC: 17.9 10*3/uL — ABNORMAL HIGH (ref 4.0–10.5)
WBC: 14.9 10*3/uL — ABNORMAL HIGH (ref 4.0–10.5)
nRBC: 0 % (ref 0.0–0.2)
nRBC: 0 % (ref 0.0–0.2)

## 2023-07-27 LAB — RESP PANEL BY RT-PCR (RSV, FLU A&B, COVID)  RVPGX2
Influenza A by PCR: NEGATIVE
Influenza B by PCR: NEGATIVE
Resp Syncytial Virus by PCR: NEGATIVE
SARS Coronavirus 2 by RT PCR: NEGATIVE

## 2023-07-27 LAB — I-STAT CHEM 8, ED
BUN: 20 mg/dL (ref 8–23)
Calcium, Ion: 1.2 mmol/L (ref 1.15–1.40)
Chloride: 98 mmol/L (ref 98–111)
Creatinine, Ser: 1.1 mg/dL (ref 0.61–1.24)
Glucose, Bld: 177 mg/dL — ABNORMAL HIGH (ref 70–99)
HCT: 43 % (ref 39.0–52.0)
Hemoglobin: 14.6 g/dL (ref 13.0–17.0)
Potassium: 4 mmol/L (ref 3.5–5.1)
Sodium: 136 mmol/L (ref 135–145)
TCO2: 26 mmol/L (ref 22–32)

## 2023-07-27 LAB — CBG MONITORING, ED
Glucose-Capillary: 118 mg/dL — ABNORMAL HIGH (ref 70–99)
Glucose-Capillary: 163 mg/dL — ABNORMAL HIGH (ref 70–99)

## 2023-07-27 LAB — GLUCOSE, CAPILLARY: Glucose-Capillary: 174 mg/dL — ABNORMAL HIGH (ref 70–99)

## 2023-07-27 LAB — I-STAT CG4 LACTIC ACID, ED
Lactic Acid, Venous: 2.1 mmol/L (ref 0.5–1.9)
Lactic Acid, Venous: 2.1 mmol/L (ref 0.5–1.9)

## 2023-07-27 LAB — PROTIME-INR
INR: 1.1 (ref 0.8–1.2)
Prothrombin Time: 14.3 s (ref 11.4–15.2)

## 2023-07-27 LAB — TYPE AND SCREEN
ABO/RH(D): O NEG
Antibody Screen: NEGATIVE

## 2023-07-27 LAB — POC OCCULT BLOOD, ED: Fecal Occult Bld: NEGATIVE

## 2023-07-27 MED ORDER — POLYVINYL ALCOHOL 1.4 % OP SOLN: 1.0000 [drp] | OPHTHALMIC | Status: DC | PRN

## 2023-07-27 MED ORDER — ONDANSETRON HCL 4 MG PO TABS: 4.0000 mg | ORAL_TABLET | Freq: Four times a day (QID) | ORAL | Status: DC | PRN

## 2023-07-27 MED ORDER — HYDRALAZINE HCL 20 MG/ML IJ SOLN: 5.0000 mg | INTRAMUSCULAR | Status: DC | PRN

## 2023-07-27 MED ORDER — LACTATED RINGERS IV BOLUS (SEPSIS)
500.0000 mL | Freq: Once | INTRAVENOUS | Status: AC
Start: 2023-07-27 — End: 2023-07-27
  Administered 2023-07-27: 500 mL via INTRAVENOUS

## 2023-07-27 MED ORDER — IPRATROPIUM-ALBUTEROL 0.5-2.5 (3) MG/3ML IN SOLN: 3.0000 mL | RESPIRATORY_TRACT | Status: DC | PRN

## 2023-07-27 MED ORDER — FLUTICASONE PROPIONATE 50 MCG/ACT NA SUSP
2.0000 | Freq: Two times a day (BID) | NASAL | Status: DC
  Administered 2023-07-27 – 2023-07-30 (×6): 2 via NASAL
  Filled 2023-07-27: qty 16

## 2023-07-27 MED ORDER — ASPIRIN 81 MG PO TBEC
81.0000 mg | DELAYED_RELEASE_TABLET | Freq: Every day | ORAL | Status: DC
  Administered 2023-07-27 – 2023-07-28 (×2): 81 mg via ORAL
  Filled 2023-07-27 (×2): qty 1

## 2023-07-27 MED ORDER — ONDANSETRON HCL 4 MG/2ML IJ SOLN: 4.0000 mg | Freq: Four times a day (QID) | INTRAMUSCULAR | Status: DC | PRN

## 2023-07-27 MED ORDER — DEXTROSE 5 % IV SOLN
500.0000 mg | INTRAVENOUS | Status: DC
  Administered 2023-07-27 – 2023-07-28 (×2): 500 mg via INTRAVENOUS
  Filled 2023-07-27 (×2): qty 5

## 2023-07-27 MED ORDER — FAMOTIDINE 20 MG PO TABS: 20.0000 mg | ORAL_TABLET | Freq: Every evening | ORAL | Status: DC | PRN

## 2023-07-27 MED ORDER — LACTATED RINGERS IV SOLN: INTRAVENOUS | Status: DC

## 2023-07-27 MED ORDER — SODIUM CHLORIDE 0.9 % IV SOLN
2.0000 g | INTRAVENOUS | Status: DC
  Administered 2023-07-27 – 2023-07-28 (×2): 2 g via INTRAVENOUS
  Filled 2023-07-27 (×2): qty 20

## 2023-07-27 MED ORDER — LACTATED RINGERS IV BOLUS (SEPSIS)
1000.0000 mL | Freq: Once | INTRAVENOUS | Status: AC
  Administered 2023-07-27: 1000 mL via INTRAVENOUS

## 2023-07-27 MED ORDER — INSULIN ASPART 100 UNIT/ML IJ SOLN
0.0000 [IU] | INTRAMUSCULAR | Status: DC
  Administered 2023-07-27 (×2): 2 [IU] via SUBCUTANEOUS
  Administered 2023-07-28: 3 [IU] via SUBCUTANEOUS
  Administered 2023-07-28: 1 [IU] via SUBCUTANEOUS
  Administered 2023-07-28 (×2): 2 [IU] via SUBCUTANEOUS
  Administered 2023-07-29: 3 [IU] via SUBCUTANEOUS
  Administered 2023-07-29: 2 [IU] via SUBCUTANEOUS
  Administered 2023-07-29: 5 [IU] via SUBCUTANEOUS
  Administered 2023-07-29 (×2): 1 [IU] via SUBCUTANEOUS
  Administered 2023-07-29 – 2023-07-30 (×3): 2 [IU] via SUBCUTANEOUS
  Administered 2023-07-30: 1 [IU] via SUBCUTANEOUS

## 2023-07-27 MED ORDER — ACETAMINOPHEN 325 MG PO TABS
650.0000 mg | ORAL_TABLET | Freq: Four times a day (QID) | ORAL | Status: DC | PRN
  Administered 2023-07-27: 650 mg via ORAL
  Filled 2023-07-27: qty 2

## 2023-07-27 MED ORDER — PANTOPRAZOLE SODIUM 40 MG IV SOLR
40.0000 mg | Freq: Two times a day (BID) | INTRAVENOUS | Status: DC
  Administered 2023-07-27 – 2023-07-30 (×7): 40 mg via INTRAVENOUS
  Filled 2023-07-27 (×7): qty 10

## 2023-07-27 NOTE — Sepsis Progress Note (Signed)
Notified bedside nurse of need to draw repeat lactic acid. 

## 2023-07-27 NOTE — ED Notes (Signed)
ED TO INPATIENT HANDOFF REPORT  ED Nurse Name and Phone #: Jesse Sans [phone]  S Name/Age/Gender Jackson [name] 89 y.o. male Room/Bed: 007C/007C  Code Status   Code Status: Do not attempt resuscitation (DNR) PRE-ARREST INTERVENTIONS DESIRED  Home/SNF/Other Home Patient oriented to: self, place, time, and situation Is this baseline? Yes      Chief Complaint Multifocal pneumonia [J18.9]  Triage Note Pt arrived via EMS from home. Pts family reports pt began vomiting brought red blood which has happened with acid reflux in the past. Family reports he likely aspirated and was coughing after episode. EMS reports initial SpO2 of 80% on 4L nasal cannula. Pt wears oxygen PRN. Pt on 4L [place] at 92% on arrival after a short time on NRB with EMS.    Allergies Allergies  Allergen Reactions   Actos [Pioglitazone] Swelling   Formaldehyde Other (See Comments)    Flu-like symptoms   Lipitor [Atorvastatin Calcium] Other (See Comments)    myalgia   Metformin And Related Diarrhea    unknown    Level of Care/Admitting Diagnosis ED Disposition     ED Disposition  Admit   Condition  --   Comment  Hospital Area: [facility] [[id number]]  Level of Care: Progressive [102]  Admit to Progressive based on following criteria: MULTISYSTEM THREATS such as stable sepsis, metabolic/electrolyte imbalance with or without encephalopathy that is responding to early treatment.  May admit patient to [facility] or [facility] if equivalent level of care is available:: Yes  Covid Evaluation: Asymptomatic - no recent exposure (last 10 days) testing not required  Diagnosis: Multifocal pneumonia [4540981]  Admitting Physician: Briscoe Deutscher [[id number]]  Attending Physician: Briscoe Deutscher [[id number]]  Certification:: I certify this patient will need inpatient services for at least 2 midnights  Expected Medical Readiness: 07/30/2023          B Medical/Surgery History Past Medical History:   Diagnosis Date   Aortic stenosis    Echo 3/18: mild aortic stenosis (mean 12/peak 22 // Echo 3/19: mild AS (mean 12, peak 20) // Echo 02/2019: EF 60-65, grade 1 diastolic dysfunction, mild MAC, mild aortic stenosis (mean gradient 9), mild dilation of the ascending aorta (38 mm)    Atherosclerosis of aorta (HCC) 12/16/2016   CXR 6/16: IMPRESSION: 1. Resolved left basilar airspace opacities. 2. Hazy nodularity in both lungs compatible with the patient's known pleural plaques. 3. Right anterior hemidiaphragmatic eventration. 4. Thoracic spondylosis. 5. Atherosclerotic calcification of the aortic arch.   Bradycardia    Low HR (30s) noted during sleep during admx for sepsis in 2016; Mobitz 1, 2:1 block >> no indication for pacer; management of OSA recommended // transient CHB noted on ECG during admit in 10/2018    Diabetes mellitus without complication (HCC)    Diabetic neuropathy (HCC)    DJD (degenerative joint disease)    knee, hip   GERD (gastroesophageal reflux disease)    HLD (hyperlipidemia)    Hypertension    Hyperthyroidism    Lexiscan Myoview 06/2018   Normal perfusion, EF 81; Low Risk   Mild diastolic dysfunction 11/18/2018   Noted on Echocardiogram; Patient does not require diuretic Rx   Phlebitis of left leg    Past Surgical History:  Procedure Laterality Date   CATARACT EXTRACTION Right    CIRCUMCISION     FRACTURE SURGERY     TOTAL HIP ARTHROPLASTY Left 09/24/2017   Procedure: LEFT TOTAL HIP ARTHROPLASTY ANTERIOR APPROACH;  Surgeon: Doneen Poisson  Y, MD;  Location: [facility];  Service: Orthopedics;  Laterality: Left;     A IV Location/Drains/Wounds Patient Lines/Drains/Airways Status     Active Line/Drains/Airways     Name Placement date Placement time Site Days   Peripheral IV 03/12/22 20 G 1" Anterior;Proximal;Right Forearm 03/12/22  2001  Forearm  502   Peripheral IV 07/27/23 20 G Posterior;Right Hand 07/27/23  0600  Hand  less than 1             Intake/Output Last 24 hours  Intake/Output Summary (Last 24 hours) at 07/27/2023 1712 Last data filed at 07/27/2023 1531 Gross per 24 hour  Intake 100 ml  Output 400 ml  Net -300 ml    Labs/Imaging Results for orders placed or performed during the hospital encounter of 07/27/23 (from the past 48 hour(s))  I-stat chem 8, ed     Status: Abnormal   Collection Time: 07/27/23  5:13 AM  Result Value Ref Range   Sodium 136 135 - 145 mmol/L   Potassium 4.0 3.5 - 5.1 mmol/L   Chloride 98 98 - 111 mmol/L   BUN 20 8 - 23 mg/dL   Creatinine, Ser 1.61 0.61 - 1.24 mg/dL   Glucose, Bld 096 (H) 70 - 99 mg/dL    Comment: Glucose reference range applies only to samples taken after fasting for at least 8 hours.   Calcium, Ion 1.20 1.15 - 1.40 mmol/L   TCO2 26 22 - 32 mmol/L   Hemoglobin 14.6 13.0 - 17.0 g/dL   HCT 04.5 40.9 - 81.1 %  I-Stat CG4 Lactic Acid, ED     Status: Abnormal   Collection Time: 07/27/23  5:14 AM  Result Value Ref Range   Lactic Acid, Venous 2.1 (HH) 0.5 - 1.9 mmol/L   Comment NOTIFIED PHYSICIAN   CBC with Differential     Status: Abnormal   Collection Time: 07/27/23  5:30 AM  Result Value Ref Range   WBC 11.6 (H) 4.0 - 10.5 K/uL   RBC 4.45 4.22 - 5.81 MIL/uL   Hemoglobin 13.9 13.0 - 17.0 g/dL   HCT 91.4 78.2 - 95.6 %   MCV 93.3 80.0 - 100.0 fL   MCH 31.2 26.0 - 34.0 pg   MCHC 33.5 30.0 - 36.0 g/dL   RDW 21.3 08.6 - 57.8 %   Platelets 277 150 - 400 K/uL   nRBC 0.0 0.0 - 0.2 %   Neutrophils Relative % 88 %   Neutro Abs 10.2 (H) 1.7 - 7.7 K/uL   Lymphocytes Relative 6 %   Lymphs Abs 0.7 0.7 - 4.0 K/uL   Monocytes Relative 5 %   Monocytes Absolute 0.6 0.1 - 1.0 K/uL   Eosinophils Relative 1 %   Eosinophils Absolute 0.1 0.0 - 0.5 K/uL   Basophils Relative 0 %   Basophils Absolute 0.0 0.0 - 0.1 K/uL   Immature Granulocytes 0 %   Abs Immature Granulocytes 0.03 0.00 - 0.07 K/uL    Comment: Performed at [facility], [address], [place], [place]  [address]  Protime-INR     Status: None   Collection Time: 07/27/23  5:30 AM  Result Value Ref Range   Prothrombin Time 14.3 11.4 - 15.2 seconds   INR 1.1 0.8 - 1.2    Comment: (NOTE) INR goal varies based on device and disease states. Performed at [facility], [address], [place], [place] [address]   Type and screen [facility]  Status: None   Collection Time: 07/27/23  5:30 AM  Result Value Ref Range   ABO/RH(D) O NEG    Antibody Screen NEG    Sample Expiration      07/30/2023,2359 Performed at [facility], [address] [place], [place] [address]   Resp panel by RT-PCR (RSV, Flu A&B, Covid) Anterior Nasal Swab     Status: None   Collection Time: 07/27/23  5:34 AM   Specimen: Anterior Nasal Swab  Result Value Ref Range   SARS Coronavirus 2 by RT PCR NEGATIVE NEGATIVE   Influenza A by PCR NEGATIVE NEGATIVE   Influenza B by PCR NEGATIVE NEGATIVE    Comment: (NOTE) The Xpert Xpress SARS-CoV-2/FLU/RSV plus assay is intended as an aid in the diagnosis of influenza from Nasopharyngeal swab specimens and should not be used as a sole basis for treatment. Nasal washings and aspirates are unacceptable for Xpert Xpress SARS-CoV-2/FLU/RSV testing.  Fact Sheet for Patients: BloggerCourse.com  Fact Sheet for Healthcare Providers: SeriousBroker.it  This test is not yet approved or cleared by the [place] FDA and has been authorized for detection and/or diagnosis of SARS-CoV-2 by FDA under an Emergency Use Authorization (EUA). This EUA will remain in effect (meaning this test can be used) for the duration of the COVID-19 declaration under Section 564(b)(1) of the Act, 21 U.S.C. section 360bbb-3(b)(1), unless the authorization is terminated or revoked.     Resp Syncytial Virus by PCR NEGATIVE NEGATIVE    Comment: (NOTE) Fact Sheet for  Patients: BloggerCourse.com  Fact Sheet for Healthcare Providers: SeriousBroker.it  This test is not yet approved or cleared by the [place] FDA and has been authorized for detection and/or diagnosis of SARS-CoV-2 by FDA under an Emergency Use Authorization (EUA). This EUA will remain in effect (meaning this test can be used) for the duration of the COVID-19 declaration under Section 564(b)(1) of the Act, 21 U.S.C. section 360bbb-3(b)(1), unless the authorization is terminated or revoked.  Performed at [facility], [address], [place], [place] [address]   POC occult blood, ED     Status: None   Collection Time: 07/27/23  5:41 AM  Result Value Ref Range   Fecal Occult Bld NEGATIVE NEGATIVE  I-Stat Lactic Acid, ED     Status: Abnormal   Collection Time: 07/27/23  7:42 AM  Result Value Ref Range   Lactic Acid, Venous 2.1 (HH) 0.5 - 1.9 mmol/L   Comment NOTIFIED PHYSICIAN   CBG monitoring, ED     Status: Abnormal   Collection Time: 07/27/23 11:25 AM  Result Value Ref Range   Glucose-Capillary 118 (H) 70 - 99 mg/dL    Comment: Glucose reference range applies only to samples taken after fasting for at least 8 hours.  CBC     Status: Abnormal   Collection Time: 07/27/23 11:28 AM  Result Value Ref Range   WBC 17.9 (H) 4.0 - 10.5 K/uL   RBC 4.40 4.22 - 5.81 MIL/uL   Hemoglobin 13.3 13.0 - 17.0 g/dL   HCT 09.8 11.9 - 14.7 %   MCV 92.7 80.0 - 100.0 fL   MCH 30.2 26.0 - 34.0 pg   MCHC 32.6 30.0 - 36.0 g/dL   RDW 82.9 56.2 - 13.0 %   Platelets 258 150 - 400 K/uL   nRBC 0.0 0.0 - 0.2 %    Comment: Performed at [facility], [address] [place], [place] [address]  CBG monitoring, ED  Status: Abnormal   Collection Time: 07/27/23  3:19 PM  Result Value Ref Range   Glucose-Capillary 163 (H) 70 - 99 mg/dL    Comment: Glucose reference range applies only to samples taken after fasting for at least 8 hours.    DG Chest Port 1 View  Result Date: 07/27/2023 CLINICAL DATA:  Sepsis. EXAM: PORTABLE CHEST 1 VIEW COMPARISON:  03/12/2022 FINDINGS: Diffuse airspace disease in the left lung associated with collapse/consolidative disease in the right base and small right pleural effusion. The cardio pericardial silhouette is enlarged. No acute bony abnormality. Telemetry leads overlie the chest. IMPRESSION: Diffuse airspace disease in the left lung with collapse/consolidative disease in the right base and small right pleural effusion. Imaging features compatible with multifocal pneumonia. Electronically Signed   By: Kennith Center M.D.   On: 07/27/2023 05:36    Pending Labs Unresulted Labs (From admission, onward)     Start     Ordered   07/28/23 0500  Basic metabolic panel  Tomorrow morning,   R        07/27/23 1026   07/28/23 0500  CBC  Tomorrow morning,   R        07/27/23 1026   07/27/23 1028  CBC  Now then every 8 hours,   R      07/27/23 1027   07/27/23 1026  Legionella Pneumophila Serogp 1 Ur Ag  (COPD / Pneumonia / Cellulitis / Lower Extremity Wound)  Once,   R        07/27/23 1026   07/27/23 1026  Strep pneumoniae urinary antigen  (COPD / Pneumonia / Cellulitis / Lower Extremity Wound)  Once,   R        07/27/23 1026   07/27/23 0517  Expectorated Sputum Assessment w Gram Stain, Rflx to Resp Cult  (Undifferentiated presentation (screening labs and basic nursing orders))  ONCE - URGENT,   URGENT        07/27/23 0516   07/27/23 0516  Blood Culture (routine x 2)  (Undifferentiated presentation (screening labs and basic nursing orders))  BLOOD CULTURE X 2,   STAT      07/27/23 0516            Vitals/Pain Today's Vitals   07/27/23 1500 07/27/23 1545 07/27/23 1551 07/27/23 1615  BP: (!) 142/46 (!) 144/44  (!) 160/40  Pulse: (!) 42 (!) 43  (!) 50  Resp: (!) 22 (!) 24  17  Temp:   98 F (36.7 C)   TempSrc:   Oral   SpO2: 100% 100%  100%  PainSc:        Isolation Precautions No active  isolations  Medications Medications  lactated ringers infusion ( Intravenous New Bag/Given 07/27/23 1236)  cefTRIAXone (ROCEPHIN) 2 g in sodium chloride 0.9 % 100 mL IVPB (0 g Intravenous Stopped 07/27/23 0616)  azithromycin (ZITHROMAX) 500 mg in dextrose 5 % 250 mL IVPB (0 mg Intravenous Stopped 07/27/23 0738)  pantoprazole (PROTONIX) injection 40 mg (40 mg Intravenous Given 07/27/23 1003)  fluticasone (FLONASE) 50 MCG/ACT nasal spray 2 spray (0 sprays Each Nare Hold 07/27/23 1119)  insulin aspart (novoLOG) injection 0-9 Units (2 Units Subcutaneous Given 07/27/23 1525)  ondansetron (ZOFRAN) tablet 4 mg (has no administration in time range)    Or  ondansetron (ZOFRAN) injection 4 mg (has no administration in time range)  ipratropium-albuterol (DUONEB) 0.5-2.5 (3) MG/3ML nebulizer solution 3 mL (has no administration in time range)  carboxymethylcellulose (REFRESH PLUS) 0.5 % ophthalmic solution  1 drop (has no administration in time range)  aspirin EC tablet 81 mg (81 mg Oral Given 07/27/23 1237)  famotidine (PEPCID) tablet 20 mg (has no administration in time range)  acetaminophen (TYLENOL) tablet 650 mg (has no administration in time range)  lactated ringers bolus 1,000 mL (0 mLs Intravenous Stopped 07/27/23 0650)    And  lactated ringers bolus 1,000 mL (0 mLs Intravenous Stopped 07/27/23 0650)    And  lactated ringers bolus 500 mL (0 mLs Intravenous Stopped 07/27/23 0738)    Mobility walks with person assist     Focused Assessments Neuro Assessment Handoff:  Swallow screen pass? Yes  Cardiac Rhythm: Sinus bradycardia       Neuro Assessment:   Neuro Checks:      Has TPA been given? No If patient is a Neuro Trauma and patient is going to OR before floor call report to 4N Charge nurse: [phone] or [phone]   R Recommendations: See Admitting Provider Note  Report given to:   Additional Notes: pt is AAOx4

## 2023-07-27 NOTE — Sepsis Progress Note (Signed)
Following for sepsis monitoring ?

## 2023-07-27 NOTE — ED Triage Notes (Signed)
Pt arrived via EMS from home. Pts family reports pt began vomiting brought red blood which has happened with acid reflux in the past. Family reports he likely aspirated and was coughing after episode. EMS reports initial SpO2 of 80% on 4L nasal cannula. Pt wears oxygen PRN. Pt on 4L [place] at 92% on arrival after a short time on NRB with EMS.

## 2023-07-27 NOTE — Evaluation (Signed)
Clinical/Bedside Swallow Evaluation Patient Details  Name: Jackson [name] MRN: [id number] Date of Birth: 02-Jun-1929  Today's Date: 07/27/2023 Time: SLP Start Time (ACUTE ONLY): 1235 SLP Stop Time (ACUTE ONLY): 1250 SLP Time Calculation (min) (ACUTE ONLY): 15 min  Past Medical History:  Past Medical History:  Diagnosis Date   Aortic stenosis    Echo 3/18: mild aortic stenosis (mean 12/peak 22 // Echo 3/19: mild AS (mean 12, peak 20) // Echo 02/2019: EF 60-65, grade 1 diastolic dysfunction, mild MAC, mild aortic stenosis (mean gradient 9), mild dilation of the ascending aorta (38 mm)    Atherosclerosis of aorta (HCC) 12/16/2016   CXR 6/16: IMPRESSION: 1. Resolved left basilar airspace opacities. 2. Hazy nodularity in both lungs compatible with the patient's known pleural plaques. 3. Right anterior hemidiaphragmatic eventration. 4. Thoracic spondylosis. 5. Atherosclerotic calcification of the aortic arch.   Bradycardia    Low HR (30s) noted during sleep during admx for sepsis in 2016; Mobitz 1, 2:1 block >> no indication for pacer; management of OSA recommended // transient CHB noted on ECG during admit in 10/2018    Diabetes mellitus without complication (HCC)    Diabetic neuropathy (HCC)    DJD (degenerative joint disease)    knee, hip   GERD (gastroesophageal reflux disease)    HLD (hyperlipidemia)    Hypertension    Hyperthyroidism    Lexiscan Myoview 06/2018   Normal perfusion, EF 81; Low Risk   Mild diastolic dysfunction 11/18/2018   Noted on Echocardiogram; Patient does not require diuretic Rx   Phlebitis of left leg    Past Surgical History:  Past Surgical History:  Procedure Laterality Date   CATARACT EXTRACTION Right    CIRCUMCISION     FRACTURE SURGERY     TOTAL HIP ARTHROPLASTY Left 09/24/2017   Procedure: LEFT TOTAL HIP ARTHROPLASTY ANTERIOR APPROACH;  Surgeon: Kathryne Hitch, MD;  Location: [facility];  Service: Orthopedics;  Laterality: Left;   HPI:  Patient is a  89 y.o. male with PMH:  DM, HTN, HLD, GERD, and chronic diastolic CHF. He presented to the ED on 07/27/2023 with hematemesis and concern for aspiration.  Per son's report, patient had incrased coughing overnight and this eventually led to large volume hematemesis. Son also reported that  with patient's history of acid reflux hematemesis is not abnormal after "coughing spells "and occurs every few months. After patient vomited, he had SOB and chills and son called EMS. Upon arrival to ED, patient was hypoxic on his 4L oxygen that he uses PRN at home. He was febrile at 101 F. CXR showed diffuse airspace disease in left lung with associated collapse and consolidative disease in right base and a small right pleural effusion consistent with multifocal pneumonia. He was made NPO awaiting SLP swallow evaluation.    Assessment / Plan / Recommendation  Clinical Impression  Patient is not presenting with clinical s/s of an oral or pharyngeal phase dysphagia as per this bedside swallow evaluation. Mastication and oral transit were mildly delayed but patient does not have his upper denture partials available at this time. He indicated to SLP that he eats "soft food" at home. SLP assessed his swallow with PO's of thin liquids, puree solids and mechanical soft solids. No overt s/s aspiration or penetration and questionable mild swallow initiation delay. SLP reviewed patient's past swallow evaluations (most recent was a bedside swallow evaluation in 2023) which all suggested his primary dysphagia is esophageal in nature. He has a h/o GERD and  in past notes, he reportedly does not adhere to GERD precautions such as not eating close to bedtime, not lying down flat when sleeping, etc. Patient remains at risk for post-prandial aspiration secondary to his GERD/esophageal dysphagia. SLP will f/u at least one time to determine if he could advance to regular solids. SLP Visit Diagnosis: Dysphagia, unspecified (R13.10)    Aspiration  Risk  Mild aspiration risk    Diet Recommendation Dysphagia 3 (Mech soft);Thin liquid    Liquid Administration via: Cup;Straw Medication Administration: Other (Comment) (as tolerated) Supervision: Patient able to self feed;Intermittent supervision to cue for compensatory strategies Compensations: Slow rate;Small sips/bites;Minimize environmental distractions Postural Changes: Seated upright at 90 degrees;Remain upright for at least 30 minutes after po intake    Other  Recommendations Oral Care Recommendations: Oral care BID    Recommendations for follow up therapy are one component of a multi-disciplinary discharge planning process, led by the attending physician.  Recommendations may be updated based on patient status, additional functional criteria and insurance authorization.  Follow up Recommendations No SLP follow up      Assistance Recommended at Discharge    Functional Status Assessment Patient has had a recent decline in their functional status and demonstrates the ability to make significant improvements in function in a reasonable and predictable amount of time.  Frequency and Duration min 1 x/week  1 week       Prognosis Prognosis for improved oropharyngeal function: Good      Swallow Study   General Date of Onset: 07/27/23 HPI: Patient is a 89 y.o. male with PMH:  DM, HTN, HLD, GERD, and chronic diastolic CHF. He presented to the ED on 07/27/2023 with hematemesis and concern for aspiration.  Per son's report, patient had incrased coughing overnight and this eventually led to large volume hematemesis. Son also reported that  with patient's history of acid reflux hematemesis is not abnormal after "coughing spells "and occurs every few months. After patient vomited, he had SOB and chills and son called EMS. Upon arrival to ED, patient was hypoxic on his 4L oxygen that he uses PRN at home. He was febrile at 101 F. CXR showed diffuse airspace disease in left lung with associated  collapse and consolidative disease in right base and a small right pleural effusion consistent with multifocal pneumonia. He was made NPO awaiting SLP swallow evaluation. Type of Study: Bedside Swallow Evaluation Previous Swallow Assessment: BSE in 2023 Diet Prior to this Study: NPO Temperature Spikes Noted: No History of Recent Intubation: No Oral Care Completed by SLP: No Oral Cavity - Dentition: Dentures, not available;Missing dentition Vision: Functional for self-feeding Self-Feeding Abilities: Able to feed self Patient Positioning: Upright in bed Baseline Vocal Quality: Normal Volitional Cough: Strong Volitional Swallow: Able to elicit    Oral/Motor/Sensory Function Overall Oral Motor/Sensory Function: Within functional limits   Ice Chips     Thin Liquid Thin Liquid: Within functional limits Presentation: Straw;Self Fed    Nectar Thick     Honey Thick     Puree Puree: Within functional limits Presentation: Self Fed   Solid     Solid: Impaired Oral Phase Functional Implications: Impaired mastication;Prolonged oral transit      Angela Nevin, MA, CCC-SLP Speech Therapy

## 2023-07-27 NOTE — ED Notes (Signed)
[name] with HR in the 40s. Benjamine Mola, DO notified. Pt asymptomatic. EKG performed.

## 2023-07-27 NOTE — H&P (Addendum)
History and Physical    Patient: Jackson [name] ZOX:[id number] DOB: 20-Dec-1928 DOA: 07/27/2023 DOS: the patient was seen and examined on 07/27/2023 PCP: Emilio Aspen, MD  Patient coming from: Home  Chief Complaint:  Chief Complaint  Patient presents with   Hematemesis   HPI: Jackson [name] is a 89 y.o. male with medical history significant of  DM, HTN, HLD, GERD, and chronic diastolic CHF presenting with hematemesis and concern for aspiration.  Overnight son noted his having increased coughing which eventually precipitated what was reported as large-volume hematemesis.  Son reports that with patient's history of acid reflux hematemesis is not abnormal after "coughing spells "and occurs every few months.  After he vomited he later noted shortness of breath and chills and son decided to call EMS.  On EMS arrival patient was noted to be hypoxic on 4 L nasal cannula that he uses as needed at home.  Patient and son denies known sick contacts or recent URI symptoms. Other than intermittent cough patient had been asymptomatic prior to his coughing spell last night.  Denies chest pain, palpitations, or abdominal pain.  ED Course: On arrival to [facility], ED patient was noted to be febrile with a rectal temp 101 Fahrenheit, hypotensive BP 113/25 with a MAP of 57, HR 57, RR 22, SpO2 91% on 4 L nasal cannula.  Labs notable for lactic acid 2.1, leukocytosis, PT/INR both normal.  Respiratory panel negative for flu A and B and COVID.  WBC 11.6, and glucose 177. Blood and sputum cultures collected and in process. CXR shows diffuse airspace disease in the left lung with associated collapse and consolidative disease in the right base and a small right pleural effusion consistent with multifocal pneumonia.  Review of Systems: As mentioned in the history of present illness. All other systems reviewed and are negative. Past Medical History:  Diagnosis Date   Aortic stenosis    Echo 3/18: mild aortic  stenosis (mean 12/peak 22 // Echo 3/19: mild AS (mean 12, peak 20) // Echo 02/2019: EF 60-65, grade 1 diastolic dysfunction, mild MAC, mild aortic stenosis (mean gradient 9), mild dilation of the ascending aorta (38 mm)    Atherosclerosis of aorta (HCC) 12/16/2016   CXR 6/16: IMPRESSION: 1. Resolved left basilar airspace opacities. 2. Hazy nodularity in both lungs compatible with the patient's known pleural plaques. 3. Right anterior hemidiaphragmatic eventration. 4. Thoracic spondylosis. 5. Atherosclerotic calcification of the aortic arch.   Bradycardia    Low HR (30s) noted during sleep during admx for sepsis in 2016; Mobitz 1, 2:1 block >> no indication for pacer; management of OSA recommended // transient CHB noted on ECG during admit in 10/2018    Diabetes mellitus without complication (HCC)    Diabetic neuropathy (HCC)    DJD (degenerative joint disease)    knee, hip   GERD (gastroesophageal reflux disease)    HLD (hyperlipidemia)    Hypertension    Hyperthyroidism    Lexiscan Myoview 06/2018   Normal perfusion, EF 81; Low Risk   Mild diastolic dysfunction 11/18/2018   Noted on Echocardiogram; Patient does not require diuretic Rx   Phlebitis of left leg    Past Surgical History:  Procedure Laterality Date   CATARACT EXTRACTION Right    CIRCUMCISION     FRACTURE SURGERY     TOTAL HIP ARTHROPLASTY Left 09/24/2017   Procedure: LEFT TOTAL HIP ARTHROPLASTY ANTERIOR APPROACH;  Surgeon: Kathryne Hitch, MD;  Location: [facility];  Service: Orthopedics;  Laterality: Left;   Social History:  reports that he quit smoking about 64 years ago. His smoking use included cigarettes. He has never used smokeless tobacco. He reports that he does not drink alcohol and does not use drugs.  Allergies  Allergen Reactions   Actos [Pioglitazone] Swelling   Formaldehyde Other (See Comments)    Flu-like symptoms   Lipitor [Atorvastatin Calcium] Other (See Comments)    myalgia   Metformin And Related  Diarrhea    unknown    Family History  Problem Relation Age of Onset   Diabetes Mother    Diabetes Sister    Diabetes Brother    Heart attack Neg Hx    Stroke Neg Hx    Hypertension Neg Hx     Prior to Admission medications   Medication Sig Start Date End Date Taking? Authorizing Provider  acetaminophen (TYLENOL) 500 MG tablet Take 1,000 mg by mouth every 8 (eight) hours as needed for mild pain (pain score 1-3) or headache.   Yes [provider]  aspirin EC 81 MG tablet Take 81 mg by mouth daily.   Yes [provider]  Calcium Carbonate-Vitamin D (CALCIUM 600+D PO) Take 1 tablet by mouth daily.   Yes [provider]  gabapentin (NEURONTIN) 100 MG capsule Take 300 mg by mouth at bedtime.    Yes [provider]  Insulin Glargine (LANTUS) 100 UNIT/ML Solostar Pen Inject 13 Units into the skin daily at 10 pm.   Yes [provider]  omeprazole (PRILOSEC) 20 MG capsule Take 20 mg by mouth daily. 10/20/16  Yes [provider]  rosuvastatin (CRESTOR) 10 MG tablet TAKE 1 TABLET AT BEDTIME Patient taking differently: Take 10 mg by mouth at bedtime. 03/26/20  Yes Yates Decamp, MD  tamsulosin (FLOMAX) 0.4 MG CAPS capsule Take 0.4 mg by mouth daily after supper. 08/08/22  Yes [provider]  tetrahydrozoline-zinc (VISINE-AC) 0.05-0.25 % ophthalmic solution Place 2 drops into both eyes 3 (three) times daily as needed (dry eyes).   Yes [provider]  TRULICITY 0.75 MG/0.5ML SOPN Inject 0.75 mg into the skin once a week. Inject on Monday 09/28/22  Yes [provider]  famotidine (PEPCID) 20 MG tablet Take 20 mg by mouth at bedtime. 09/29/22   [provider]  fluticasone (FLONASE) 50 MCG/ACT nasal spray Place 2 sprays into both nostrils 2 (two) times daily. 11/20/18   Arrien, York Ram, MD  HUMALOG KWIKPEN 200 UNIT/ML KwikPen Inject into the skin. 06/11/22   [provider]  insulin aspart (NOVOLOG FLEXPEN)  100 UNIT/ML FlexPen Before each meal 3 times a day, 140-199 - 2 units, 200-250 - 4 units, 251-299 - 6 units,  300-349 - 8 units,  350 or above 10 units. Patient taking differently: Inject 2-10 Units into the skin See admin instructions. Per sliding scale before each meal 3 times a day, 140-199 = 2 units, 200-250 =4 units, 251-299 =6 units,  300-349 = 8 units,  350 or above 10 units. 02/28/15   Albertine Grates, MD  lisinopril (PRINIVIL,ZESTRIL) 5 MG tablet Take 1 tablet (5 mg total) by mouth daily. 12/26/18   Patwardhan, Anabel Bene, MD  sodium chloride (OCEAN) 0.65 % SOLN nasal spray Place 1 spray into both nostrils as needed for congestion. 11/20/18   Coralie Keens, MD    Physical Exam: Vitals:   07/27/23 0915 07/27/23 0930 07/27/23 0945 07/27/23 1000  BP: (!) 129/43 (!) 125/40 (!) 141/50 (!) 140/49  Pulse: (!) 54 Marland Kitchen)  50 60 (!) 52  Resp: (!) 23 (!) 22 (!) 24   SpO2: 99% 98% 99% 97%   Constitutional: NAD, calm, comfortable Eyes: PERRL, lids and conjunctivae normal ENMT: Mucous membranes are moist. Posterior pharynx clear of any exudate or lesions.  Neck: normal, supple, no masses, no thyromegaly Respiratory: Bilateral basilar crackles. Decreased air movement throughout. Normal respiratory effort. No accessory muscle use.  Cardiovascular: Regular rate and rhythm, no murmurs / rubs / gallops. No extremity edema. 2+ radial and pedal pulses.   Abdomen: no tenderness, no masses palpated. No hepatosplenomegaly. Bowel sounds positive.  Musculoskeletal: no clubbing / cyanosis. No joint deformity upper and lower extremities. Good ROM, no contractures. Normal muscle tone.  Skin: no rashes, lesions, ulcers. No induration Neurologic: Alert and oriented x 3.   Data Reviewed: CBC    Component Value Date/Time   WBC 11.6 (H) 07/27/2023 0530   RBC 4.45 07/27/2023 0530   HGB 13.9 07/27/2023 0530   HCT 41.5 07/27/2023 0530   PLT 277 07/27/2023 0530   MCV 93.3 07/27/2023 0530   MCH 31.2 07/27/2023 0530    MCHC 33.5 07/27/2023 0530   RDW 12.5 07/27/2023 0530   LYMPHSABS 0.7 07/27/2023 0530   MONOABS 0.6 07/27/2023 0530   EOSABS 0.1 07/27/2023 0530   BASOSABS 0.0 07/27/2023 0530      Latest Ref Rng & Units 07/27/2023    5:13 AM 03/12/2022    8:02 PM 03/11/2022    3:56 AM  BMP  Glucose 70 - 99 mg/dL 295  284  132   BUN 8 - 23 mg/dL 20  15  9    Creatinine 0.61 - 1.24 mg/dL 4.40  1.02  7.25   Sodium 135 - 145 mmol/L 136  135  135   Potassium 3.5 - 5.1 mmol/L 4.0  3.7  3.8   Chloride 98 - 111 mmol/L 98  97  96   CO2 22 - 32 mmol/L  32  28   Calcium 8.9 - 10.3 mg/dL  9.5  9.3    Protime-INR    Component Value Date/Time   PROTHROMBIN TIME INR 14.3 1.1  07/27/2023 0530 07/27/2023 0530   --/--/O NEG (11/05 0530)  Lactic Acid, Venous    Component Value Date/Time   LATICACIDVEN 2.1 (HH) 07/27/2023 0742   Results for orders placed or performed during the hospital encounter of 07/27/23  Resp panel by RT-PCR (RSV, Flu A&B, Covid) Anterior Nasal Swab     Status: None   Collection Time: 07/27/23  5:34 AM   Specimen: Anterior Nasal Swab  Result Value Ref Range Status   SARS Coronavirus 2 by RT PCR NEGATIVE NEGATIVE Final   Influenza A by PCR NEGATIVE NEGATIVE Final   Influenza B by PCR NEGATIVE NEGATIVE Final    Comment: (NOTE) The Xpert Xpress SARS-CoV-2/FLU/RSV plus assay is intended as an aid in the diagnosis of influenza from Nasopharyngeal swab specimens and should not be used as a sole basis for treatment. Nasal washings and aspirates are unacceptable for Xpert Xpress SARS-CoV-2/FLU/RSV testing.  Fact Sheet for Patients: BloggerCourse.com  Fact Sheet for Healthcare Providers: SeriousBroker.it  This test is not yet approved or cleared by the [place] FDA and has been authorized for detection and/or diagnosis of SARS-CoV-2 by FDA under an Emergency Use Authorization (EUA). This EUA will remain in effect (meaning this  test can be used) for the duration of the COVID-19 declaration under Section 564(b)(1) of the Act, 21 U.S.C. section 360bbb-3(b)(1), unless the authorization is  terminated or revoked.     Resp Syncytial Virus by PCR NEGATIVE NEGATIVE Final    Comment: (NOTE) Fact Sheet for Patients: BloggerCourse.com  Fact Sheet for Healthcare Providers: SeriousBroker.it  This test is not yet approved or cleared by the [place] FDA and has been authorized for detection and/or diagnosis of SARS-CoV-2 by FDA under an Emergency Use Authorization (EUA). This EUA will remain in effect (meaning this test can be used) for the duration of the COVID-19 declaration under Section 564(b)(1) of the Act, 21 U.S.C. section 360bbb-3(b)(1), unless the authorization is terminated or revoked.  Performed at [facility], [address] [place], [place] [address]    DG Chest Port 1 View  Result Date: 07/27/2023 CLINICAL DATA:  Sepsis. EXAM: PORTABLE CHEST 1 VIEW COMPARISON:  03/12/2022 FINDINGS: Diffuse airspace disease in the left lung associated with collapse/consolidative disease in the right base and small right pleural effusion. The cardio pericardial silhouette is enlarged. No acute bony abnormality. Telemetry leads overlie the chest. IMPRESSION: Diffuse airspace disease in the left lung with collapse/consolidative disease in the right base and small right pleural effusion. Imaging features compatible with multifocal pneumonia. Electronically Signed   By: Kennith Center M.D.   On: 07/27/2023 05:36      Assessment and Plan: #Sepsis #Multifocal Pneumonia, concern for Aspiration CXR: shows diffuse airspace disease in (L) lung with associated consolidative/collapse in the (R) base with small (R) pleural effusion. SIRS Criteria: RR 22, MAP 52, WBC 11.6, Lactic Acid 2.1 Patient also with recent dental abscess and tooth extraction last Friday. -Continue  Azithromycin and Rocephin -Continue IVF  -NPO pending Swallow Evaluation -Supplemental O2 as needed to keep SPO2 >92% -PRN nebulizers  #Acute Hypoxic Respiratory Failure secondary to Pneumonia - Treat as above  #Hematemesis #GERD - IV Protonix - NPO - Trend H&H, transfuse for HGB goal > 7 or hemodynamic instability  #Hypertension -Hold home meds while NPO and in setting of sepsis  #Type II Diabetes Mellitus #Diabetic Neuropathy -q4H CBG and SSI while NPO - Resume gabapentin when cleared for PO  #Hyperlipidemia -Continue home Crestor when cleared for PO  VTE prophylaxis: Held in setting of Hematemesis GI prophylaxis: Protonix Diet: NPO Access: PIV Lines:NONE Code Status: DNR Telemetry: Yes Disposition: Admit to Progressive    Advance Care Planning:   Code Status: Do not attempt resuscitation (DNR) PRE-ARREST INTERVENTIONS DESIRED DNR confirmed by patient as bedside with son present  Consults: None called  Family Communication: Son at bedside  Severity of Illness: The appropriate patient status for this patient is INPATIENT. Inpatient status is judged to be reasonable and necessary in order to provide the required intensity of service to ensure the patient's safety. The patient's presenting symptoms, physical exam findings, and initial radiographic and laboratory data in the context of their chronic comorbidities is felt to place them at high risk for further clinical deterioration. Furthermore, it is not anticipated that the patient will be medically stable for discharge from the hospital within 2 midnights of admission.   * I certify that at the point of admission it is my clinical judgment that the patient will require inpatient hospital care spanning beyond 2 midnights from the point of admission due to high intensity of service, high risk for further deterioration and high frequency of surveillance required.*  To reach the provider On-Call:   7AM- 7PM see care  teams to locate the attending and reach out to them via [email or web address] Password: [email or web address] 7PM-7AM contact night-coverage If you still  have difficulty reaching the appropriate provider, please page the [facility] (Director on Call) for [facility] on amion for assistance  This document was prepared using Conservation officer, historic buildings and may include unintentional dictation errors.  Bishop Limbo FNP-BC, PMHNP-BC Nurse Practitioner [facility]

## 2023-07-27 NOTE — ED Notes (Signed)
Assisted pt to [facility] to have BM. Pt had large BM. Pt assisted back to bed. Pt in bed resting comfortably with call bell in reach.

## 2023-07-27 NOTE — ED Provider Notes (Signed)
[place] EMERGENCY DEPARTMENT AT [facility] Provider Note   CSN: [id number] Arrival date & time: 07/27/23  2130     History  Chief Complaint  Patient presents with   Hematemesis    [name] is a 89 y.o. male.  The history is provided by the patient and the EMS personnel.   Patient w/history of diabetes, hypertension, CHF presents with concern for aspiration.  Patient had an increased coughing and was concerned that he aspirated.  He also vomited a large amount of blood which is reported as normal with history of acid reflux Patient uses nasal cannula at home as needed.  On EMS arrival patient was hypoxic on 4 L nasal cannula.  That is improved with nonrebreather  Patient denies any chest pain or shortness of breath at this time   Past Medical History:  Diagnosis Date   Aortic stenosis    Echo 3/18: mild aortic stenosis (mean 12/peak 22 // Echo 3/19: mild AS (mean 12, peak 20) // Echo 02/2019: EF 60-65, grade 1 diastolic dysfunction, mild MAC, mild aortic stenosis (mean gradient 9), mild dilation of the ascending aorta (38 mm)    Atherosclerosis of aorta (HCC) 12/16/2016   CXR 6/16: IMPRESSION: 1. Resolved left basilar airspace opacities. 2. Hazy nodularity in both lungs compatible with the patient's known pleural plaques. 3. Right anterior hemidiaphragmatic eventration. 4. Thoracic spondylosis. 5. Atherosclerotic calcification of the aortic arch.   Bradycardia    Low HR (30s) noted during sleep during admx for sepsis in 2016; Mobitz 1, 2:1 block >> no indication for pacer; management of OSA recommended // transient CHB noted on ECG during admit in 10/2018    Diabetes mellitus without complication (HCC)    Diabetic neuropathy (HCC)    DJD (degenerative joint disease)    knee, hip   GERD (gastroesophageal reflux disease)    HLD (hyperlipidemia)    Hypertension    Hyperthyroidism    Lexiscan Myoview 06/2018   Normal perfusion, EF 81; Low Risk   Mild diastolic  dysfunction 11/18/2018   Noted on Echocardiogram; Patient does not require diuretic Rx   Phlebitis of left leg     Home Medications Prior to Admission medications   Medication Sig Start Date End Date Taking? Authorizing Provider  albuterol (PROVENTIL) (2.5 MG/3ML) 0.083% nebulizer solution Take 2.5 mg by nebulization every 6 (six) hours as needed for wheezing or shortness of breath.    [provider]  aspirin EC 81 MG tablet Take 81 mg by mouth daily.    [provider]  BD PEN NEEDLE NANO 2ND GEN 32G X 4 MM MISC  07/14/22   [provider]  Calcium Carbonate-Vitamin D (CALCIUM 600+D PO) Take 1 tablet by mouth daily.    [provider]  Continuous Blood Gluc Receiver (FREESTYLE LIBRE 2 READER) DEVI AS DIRECTED 30 DAYS 07/08/22   [provider]  Continuous Blood Gluc Sensor (FREESTYLE LIBRE 2 SENSOR) MISC  06/09/22   [provider]  docusate sodium (COLACE) 100 MG capsule Take 100 mg by mouth at bedtime.    [provider]  famotidine (PEPCID) 20 MG tablet Take 20 mg by mouth daily. 09/29/22   [provider]  fluticasone (FLONASE) 50 MCG/ACT nasal spray Place 2 sprays into both nostrils 2 (two) times daily. 11/20/18   Arrien, York Ram, MD  gabapentin (NEURONTIN) 100 MG capsule Take 300 mg by mouth at bedtime.     [provider]  Catalina Lunger  200 UNIT/ML KwikPen Inject into the skin. 06/11/22   [provider]  insulin aspart (NOVOLOG FLEXPEN) 100 UNIT/ML FlexPen Before each meal 3 times a day, 140-199 - 2 units, 200-250 - 4 units, 251-299 - 6 units,  300-349 - 8 units,  350 or above 10 units. Patient taking differently: Inject 2-10 Units into the skin See admin instructions. Per sliding scale before each meal 3 times a day, 140-199 = 2 units, 200-250 =4 units, 251-299 =6 units,  300-349 = 8 units,  350 or above 10 units. 02/28/15   Albertine Grates, MD  Insulin Glargine (LANTUS) 100 UNIT/ML Solostar Pen  Inject 15 Units into the skin daily at 10 pm.    [provider]  lisinopril (PRINIVIL,ZESTRIL) 5 MG tablet Take 1 tablet (5 mg total) by mouth daily. 12/26/18   Patwardhan, Anabel Bene, MD  omeprazole (PRILOSEC) 20 MG capsule Take 20 mg by mouth daily. 10/20/16   [provider]  rosuvastatin (CRESTOR) 10 MG tablet TAKE 1 TABLET AT BEDTIME Patient taking differently: Take 10 mg by mouth at bedtime. 03/26/20   Yates Decamp, MD  sodium chloride (OCEAN) 0.65 % SOLN nasal spray Place 1 spray into both nostrils as needed for congestion. 11/20/18   Arrien, York Ram, MD  tamsulosin (FLOMAX) 0.4 MG CAPS capsule Take 0.4 mg by mouth daily. 08/08/22   [provider]  tetrahydrozoline-zinc (VISINE-AC) 0.05-0.25 % ophthalmic solution Place 2 drops into both eyes 3 (three) times daily as needed (dry eyes).    [provider]  TRULICITY 0.75 MG/0.5ML SOPN SMARTSIG:0.75 Milligram(s) SUB-Q Every 4 Weeks 09/28/22   [provider]      Allergies    Actos [pioglitazone], Formaldehyde, Lipitor [atorvastatin calcium], and Metformin and related    Review of Systems   Review of Systems  Respiratory:  Positive for cough.     Physical Exam Updated Vital Signs BP (!) 129/30   Pulse (!) 53   Resp (!) 22   SpO2 95%  Rectal temp 101 Physical Exam CONSTITUTIONAL: Elderly, ill-appearing HEAD: Normocephalic/atraumatic EYES: EOMI/PERRL ENMT: Mucous membranes moist Dried blood noted to his face.  No evidence of epistaxis or blood in his oropharynx NECK: supple no meningeal signs CV: S1/S2 noted LUNGS: Decreased breath sounds bilaterally, mild tachypnea, basilar crackles noted ABDOMEN: soft, nontender NEURO: Pt is awake/alert/appropriate, moves all extremitiesx4.  No facial droop.   EXTREMITIES: pulses normal/equal, full ROM SKIN: Pale  ED Results / Procedures / Treatments   Labs (all labs ordered are listed, but only abnormal results are displayed) Labs Reviewed   CBC WITH DIFFERENTIAL/PLATELET - Abnormal; Notable for the following components:      Result Value   WBC 11.6 (*)    Neutro Abs 10.2 (*)    All other components within normal limits  I-STAT CHEM 8, ED - Abnormal; Notable for the following components:   Glucose, Bld 177 (*)    All other components within normal limits  I-STAT CG4 LACTIC ACID, ED - Abnormal; Notable for the following components:   Lactic Acid, Venous 2.1 (*)    All other components within normal limits  CULTURE, BLOOD (ROUTINE X 2)  CULTURE, BLOOD (ROUTINE X 2)  EXPECTORATED SPUTUM ASSESSMENT W GRAM STAIN, RFLX TO RESP C  RESP PANEL BY RT-PCR (RSV, FLU A&B, COVID)  RVPGX2  PROTIME-INR  I-STAT CG4 LACTIC ACID, ED  POC OCCULT BLOOD, ED  TYPE AND SCREEN    EKG EKG Interpretation Date/Time:  Tuesday July 27 2023 05:37:12  EST Ventricular Rate:  58 PR Interval:    QRS Duration:  95 QT Interval:  407 QTC Calculation: 400 R Axis:   -82  Text Interpretation: suspect mobitz Type 1 Left anterior fascicular block Abnormal R-wave progression, late transition Borderline ST depression, lateral leads Confirmed by Zadie Rhine ([id number]) on 07/27/2023 5:43:45 AM  Radiology DG Chest Port 1 View  Result Date: 07/27/2023 CLINICAL DATA:  Sepsis. EXAM: PORTABLE CHEST 1 VIEW COMPARISON:  03/12/2022 FINDINGS: Diffuse airspace disease in the left lung associated with collapse/consolidative disease in the right base and small right pleural effusion. The cardio pericardial silhouette is enlarged. No acute bony abnormality. Telemetry leads overlie the chest. IMPRESSION: Diffuse airspace disease in the left lung with collapse/consolidative disease in the right base and small right pleural effusion. Imaging features compatible with multifocal pneumonia. Electronically Signed   By: Kennith Center M.D.   On: 07/27/2023 05:36    Procedures .Critical Care  Performed by: Zadie Rhine, MD Authorized by: Zadie Rhine, MD   Critical  care provider statement:    Critical care time (minutes):  50   Critical care start time:  07/27/2023 5:30 AM   Critical care end time:  07/27/2023 6:20 AM   Critical care time was exclusive of:  Separately billable procedures and treating other patients   Critical care was necessary to treat or prevent imminent or life-threatening deterioration of the following conditions:  Respiratory failure, sepsis and shock   Critical care was time spent personally by me on the following activities:  Evaluation of patient's response to treatment, examination of patient, re-evaluation of patient's condition, pulse oximetry, ordering and review of radiographic studies, ordering and review of laboratory studies, ordering and performing treatments and interventions and development of treatment plan with patient or surrogate   I assumed direction of critical care for this patient from another provider in my specialty: no     Care discussed with: admitting provider       Medications Ordered in ED Medications  lactated ringers infusion (has no administration in time range)  lactated ringers bolus 1,000 mL (1,000 mLs Intravenous New Bag/Given 07/27/23 0559)    And  lactated ringers bolus 1,000 mL (1,000 mLs Intravenous New Bag/Given 07/27/23 0559)    And  lactated ringers bolus 500 mL (has no administration in time range)  cefTRIAXone (ROCEPHIN) 2 g in sodium chloride 0.9 % 100 mL IVPB (0 g Intravenous Stopped 07/27/23 0616)  azithromycin (ZITHROMAX) 500 mg in dextrose 5 % 250 mL IVPB (has no administration in time range)  pantoprazole (PROTONIX) injection 40 mg (has no administration in time range)    ED Course/ Medical Decision Making/ A&P Clinical Course as of 07/27/23 0619  Tue Jul 27, 2023  1884 Patient presents after reportedly having cough and possible aspiration.  He also had hematemesis though this reportedly is his baseline.  He in the ER he is awake and alert but appears ill.  He has a new oxygen  requirement.  Strong suspicion this represents pneumonia as patient is febrile  Patient has no signs of acute GI bleed and his hemoglobin is stable [DW]  0544 EKG reveals a bradycardic rhythm in the 50s, suspect this is a Mobitz type I.  He has had this previously.  Will continue to monitor [DW]  [phone] Son is at bedside.  He reports the patient does have episodes of reflux which trigger hematemesis.  This is occurred in the past. Tonight he reported increased coughing and shortness of breath  as well as chills. Son reports patient is had pneumonia previously [DW]  [phone] Patient found to have multifocal pneumonia with acute respiratory failure. Initial blood pressure was in the 80s, but now had multiple readings above 100.  Heart rate maintaining in the 50s [DW]  0618 Patient continues to improve.  Receiving IV fluids and antibiotics.  Blood pressures are continue to remain stable.  Patient is awake and alert.  Discussed with Dr. Antionette Char for admission [DW]    Clinical Course User Index [DW] Zadie Rhine, MD                                 Medical Decision Making Amount and/or Complexity of Data Reviewed Labs: ordered. Radiology: ordered. ECG/medicine tests: ordered.  Risk Prescription drug management. Decision regarding hospitalization.   This patient presents to the ED for concern of shortness of breath, this involves an extensive number of treatment options, and is a complaint that carries with it a high risk of complications and morbidity.  The differential diagnosis includes but is not limited to Acute coronary syndrome, pneumonia, acute pulmonary edema, pneumothorax, acute anemia, pulmonary embolism    Comorbidities that complicate the patient evaluation: Patient's presentation is complicated by their history of CHF, diabetes  Social Determinants of Health: Patient's  poor mobility   increases the complexity of managing their presentation  Additional history  obtained: Additional history obtained from EMS  Records reviewed previous admission documents  Lab Tests: I Ordered, and personally interpreted labs.  The pertinent results include: Hyperglycemia, leukocytosis  Imaging Studies ordered: I ordered imaging studies including X-ray chest   I independently visualized and interpreted imaging which showed multifocal pneumonia I agree with the radiologist interpretation  Cardiac Monitoring: The patient was maintained on a cardiac monitor.  I personally viewed and interpreted the cardiac monitor which showed an underlying rhythm of:  sinus bradycardia  Medicines ordered and prescription drug management: I ordered medication including IV fluids and antibiotics for sepsis Reevaluation of the patient after these medicines showed that the patient    improved  Critical Interventions:   IV fluids and antibiotics  Consultations Obtained: I requested consultation with the admitting physician [facility] , and discussed  findings as well as pertinent plan - they recommend: Admit  Reevaluation: After the interventions noted above, I reevaluated the patient and found that they have :improved  Complexity of problems addressed: Patient's presentation is most consistent with  acute presentation with potential threat to life or bodily function  Disposition: After consideration of the diagnostic results and the patient's response to treatment,  I feel that the patent would benefit from admission   .           Final Clinical Impression(s) / ED Diagnoses Final diagnoses:  Acute respiratory failure with hypoxia (HCC)  Community acquired pneumonia of left upper lobe of lung    Rx / DC Orders ED Discharge Orders     None         Zadie Rhine, MD 07/27/23 [phone]

## 2023-07-28 ENCOUNTER — Inpatient Hospital Stay (HOSPITAL_COMMUNITY): Payer: Medicare (Managed Care)

## 2023-07-28 DIAGNOSIS — J189 Pneumonia, unspecified organism: Secondary | ICD-10-CM | POA: Diagnosis not present

## 2023-07-28 LAB — BASIC METABOLIC PANEL
Anion gap: 6 (ref 5–15)
BUN: 18 mg/dL (ref 8–23)
CO2: 31 mmol/L (ref 22–32)
Calcium: 9.1 mg/dL (ref 8.9–10.3)
Chloride: 100 mmol/L (ref 98–111)
Creatinine, Ser: 1.26 mg/dL — ABNORMAL HIGH (ref 0.61–1.24)
GFR, Estimated: 54 mL/min — ABNORMAL LOW (ref >60)
Glucose, Bld: 113 mg/dL — ABNORMAL HIGH (ref 70–99)
Potassium: 4.3 mmol/L (ref 3.5–5.1)
Sodium: 137 mmol/L (ref 135–145)

## 2023-07-28 LAB — CBC
HCT: 35.7 % — ABNORMAL LOW (ref 39.0–52.0)
Hemoglobin: 11.7 g/dL — ABNORMAL LOW (ref 13.0–17.0)
MCH: 30.4 pg (ref 26.0–34.0)
MCHC: 32.8 g/dL (ref 30.0–36.0)
MCV: 92.7 fL (ref 80.0–100.0)
Platelets: 234 10*3/uL (ref 150–400)
RBC: 3.85 MIL/uL — ABNORMAL LOW (ref 4.22–5.81)
RDW: 12.8 % (ref 11.5–15.5)
WBC: 11.3 10*3/uL — ABNORMAL HIGH (ref 4.0–10.5)
nRBC: 0 % (ref 0.0–0.2)

## 2023-07-28 LAB — PROCALCITONIN: Procalcitonin: 4.18 ng/mL

## 2023-07-28 LAB — GLUCOSE, CAPILLARY
Glucose-Capillary: 132 mg/dL — ABNORMAL HIGH (ref 70–99)
Glucose-Capillary: 145 mg/dL — ABNORMAL HIGH (ref 70–99)
Glucose-Capillary: 154 mg/dL — ABNORMAL HIGH (ref 70–99)
Glucose-Capillary: 158 mg/dL — ABNORMAL HIGH (ref 70–99)
Glucose-Capillary: 181 mg/dL — ABNORMAL HIGH (ref 70–99)
Glucose-Capillary: 222 mg/dL — ABNORMAL HIGH (ref 70–99)
Glucose-Capillary: 91 mg/dL (ref 70–99)
Glucose-Capillary: 97 mg/dL (ref 70–99)

## 2023-07-28 LAB — BRAIN NATRIURETIC PEPTIDE: B Natriuretic Peptide: 655 pg/mL — ABNORMAL HIGH (ref 0.0–100.0)

## 2023-07-28 LAB — C-REACTIVE PROTEIN: CRP: 13.4 mg/dL — ABNORMAL HIGH (ref <1.0)

## 2023-07-28 MED ORDER — ATROPINE SULFATE 1 MG/10ML IJ SOSY: 1.0000 mg | PREFILLED_SYRINGE | Freq: Once | INTRAMUSCULAR | Status: DC | PRN

## 2023-07-28 MED ORDER — ASPIRIN 81 MG PO TBEC: 81.0000 mg | DELAYED_RELEASE_TABLET | Freq: Every day | ORAL | Status: DC

## 2023-07-28 MED ORDER — ATROPINE SULFATE 1 MG/10ML IJ SOSY: 0.4000 mg | PREFILLED_SYRINGE | Freq: Once | INTRAMUSCULAR | Status: DC | PRN

## 2023-07-28 MED ORDER — GABAPENTIN 300 MG PO CAPS
300.0000 mg | ORAL_CAPSULE | Freq: Three times a day (TID) | ORAL | Status: DC
  Administered 2023-07-28 – 2023-07-30 (×7): 300 mg via ORAL
  Filled 2023-07-28 (×7): qty 1

## 2023-07-28 MED ORDER — SODIUM CHLORIDE 0.9 % IV SOLN
3.0000 g | Freq: Four times a day (QID) | INTRAVENOUS | Status: DC
  Administered 2023-07-28 – 2023-07-30 (×8): 3 g via INTRAVENOUS
  Filled 2023-07-28 (×8): qty 8

## 2023-07-28 MED ORDER — ROSUVASTATIN CALCIUM 5 MG PO TABS
10.0000 mg | ORAL_TABLET | Freq: Every day | ORAL | Status: DC
  Administered 2023-07-28 – 2023-07-29 (×2): 10 mg via ORAL
  Filled 2023-07-28 (×2): qty 2

## 2023-07-28 MED ORDER — AMLODIPINE BESYLATE 10 MG PO TABS
10.0000 mg | ORAL_TABLET | Freq: Every day | ORAL | Status: DC
  Administered 2023-07-28 – 2023-07-30 (×3): 10 mg via ORAL
  Filled 2023-07-28 (×3): qty 1

## 2023-07-28 MED ORDER — MAGNESIUM SULFATE IN D5W 1-5 GM/100ML-% IV SOLN
1.0000 g | Freq: Once | INTRAVENOUS | Status: AC
  Administered 2023-07-28: 1 g via INTRAVENOUS
  Filled 2023-07-28: qty 100

## 2023-07-28 MED ORDER — TAMSULOSIN HCL 0.4 MG PO CAPS
0.4000 mg | ORAL_CAPSULE | Freq: Every day | ORAL | Status: DC
  Administered 2023-07-28 – 2023-07-29 (×2): 0.4 mg via ORAL
  Filled 2023-07-28 (×2): qty 1

## 2023-07-28 NOTE — Plan of Care (Signed)
  Problem: Education: Goal: Ability to describe self-care measures that may prevent or decrease complications (Diabetes Survival Skills Education) will improve Outcome: Progressing Goal: Individualized Educational Video(s) Outcome: Progressing   Problem: Coping: Goal: Ability to adjust to condition or change in health will improve Outcome: Progressing   Problem: Fluid Volume: Goal: Ability to maintain a balanced intake and output will improve Outcome: Progressing   Problem: Health Behavior/Discharge Planning: Goal: Ability to identify and utilize available resources and services will improve Outcome: Progressing Goal: Ability to manage health-related needs will improve Outcome: Progressing   Problem: Metabolic: Goal: Ability to maintain appropriate glucose levels will improve Outcome: Progressing   Problem: Nutritional: Goal: Maintenance of adequate nutrition will improve Outcome: Progressing Goal: Progress toward achieving an optimal weight will improve Outcome: Progressing   Problem: Skin Integrity: Goal: Risk for impaired skin integrity will decrease Outcome: Progressing   Problem: Tissue Perfusion: Goal: Adequacy of tissue perfusion will improve Outcome: Progressing   Problem: Activity: Goal: Ability to tolerate increased activity will improve Outcome: Progressing   Problem: Clinical Measurements: Goal: Ability to maintain a body temperature in the normal range will improve Outcome: Progressing   Problem: Respiratory: Goal: Ability to maintain adequate ventilation will improve Outcome: Progressing Goal: Ability to maintain a clear airway will improve Outcome: Progressing   Problem: Education: Goal: Knowledge of General Education information will improve Description: Including pain rating scale, medication(s)/side effects and non-pharmacologic comfort measures Outcome: Progressing   Problem: Health Behavior/Discharge Planning: Goal: Ability to manage  health-related needs will improve Outcome: Progressing   Problem: Clinical Measurements: Goal: Ability to maintain clinical measurements within normal limits will improve Outcome: Progressing Goal: Will remain free from infection Outcome: Progressing Goal: Diagnostic test results will improve Outcome: Progressing Goal: Respiratory complications will improve Outcome: Progressing Goal: Cardiovascular complication will be avoided Outcome: Progressing   Problem: Activity: Goal: Risk for activity intolerance will decrease Outcome: Progressing   Problem: Nutrition: Goal: Adequate nutrition will be maintained Outcome: Progressing   Problem: Coping: Goal: Level of anxiety will decrease Outcome: Progressing   Problem: Elimination: Goal: Will not experience complications related to bowel motility Outcome: Progressing Goal: Will not experience complications related to urinary retention Outcome: Progressing   Problem: Pain Management: Goal: General experience of comfort will improve Outcome: Progressing   Problem: Safety: Goal: Ability to remain free from injury will improve Outcome: Progressing   Problem: Skin Integrity: Goal: Risk for impaired skin integrity will decrease Outcome: Progressing

## 2023-07-28 NOTE — Progress Notes (Signed)
PROGRESS NOTE                                                                                                                                                                                                             Patient Demographics:    Jackson [name], is a 89 y.o. male, DOB - 1929/01/18, ZOX:[id number]  Outpatient Primary MD for the patient is Emilio Aspen, MD    LOS - 1  Admit date - 07/27/2023    Chief Complaint  Patient presents with   Hematemesis       Brief Narrative (HPI from H&P)    89 y.o. male with medical history significant of  DM, HTN, HLD, GERD, and chronic diastolic CHF presenting with hematemesis and concern for aspiration.  Overnight son noted his having increased coughing which eventually precipitated what was reported as large-volume hematemesis.  Son reports that with patient's history of acid reflux hematemesis is not abnormal after "coughing spells "and occurs every few months.  After he vomited he later noted shortness of breath and chills and son decided to call EMS.  On EMS arrival patient was noted to be hypoxic on 4 L nasal cannula that he uses as needed at home.  Patient and son denies known sick contacts or recent URI symptoms.  He came to the hospital he was diagnosed with aspiration pneumonia and admitted to the hospital.   Subjective:    Tora Perches today has, No headache, No chest pain, No abdominal pain - No Nausea, No new weakness tingling or numbness, no SOB.   Assessment  & Plan :    #Sepsis due to aspiration pneumonia present on admission. Patient had history of GERD causing coughing spells with nausea vomiting and at times some hematemesis with aspiration pneumonia in the past, discussed with son, currently nausea vomiting better, no GI complaints, advance to clear liquid diet, speech follow-up, Unasyn, IV PPI and monitor.  Since pathophysiology has completely resolved    #Acute Hypoxic Respiratory Failure secondary to Pneumonia - Treat as above, oxygen as needed uses as needed oxygen at home.   #Hematemesis #GERD - IV Protonix -Previous history of same with coughing spells, question if he develops a Mallory-Weiss tear with nausea vomiting, type screen, monitor CBC, no signs of ongoing bleed, outpatient GI follow-up and workup as desired.   #Hypertension -  Heart Norvasc and monitor.  #Hyperlipidemia -Continue home Crestor when cleared for PO #Type II Diabetes Mellitus #Diabetic Neuropathy -q4H CBG and SSI while NPO - Resume gabapentin when cleared for PO  CBG (last 3)  Recent Labs    07/28/23 0010 07/28/23 0332 07/28/23 0827  GLUCAP 91 97 154*            Condition - Fair  Family Communication  :   son on 07/28/23 - [phone] on 07/28/23  Code Status :   DNR  Consults  :     PUD Prophylaxis :  PPI   Procedures  :            Disposition Plan  :    Status is: Inpatient   DVT Prophylaxis  :    SCDs Start: 07/27/23 1024    Lab Results  Component Value Date   PLT 234 07/28/2023    Diet :  Diet Order             Diet NPO time specified  Diet effective now                    Inpatient Medications  Scheduled Meds:  aspirin EC  81 mg Oral Daily   fluticasone  2 spray Each Nare BID   insulin aspart  0-9 Units Subcutaneous Q4H   pantoprazole (PROTONIX) IV  40 mg Intravenous Q12H   Continuous Infusions:  azithromycin 500 mg (07/28/23 0502)   cefTRIAXone (ROCEPHIN)  IV 2 g (07/28/23 0551)   PRN Meds:.acetaminophen, atropine, famotidine, ipratropium-albuterol, ondansetron **OR** ondansetron (ZOFRAN) IV, polyvinyl alcohol  Antibiotics  :    Anti-infectives (From admission, onward)    Start     Dose/Rate Route Frequency Ordered Stop   07/27/23 0545  cefTRIAXone (ROCEPHIN) 2 g in sodium chloride 0.9 % 100 mL IVPB        2 g 200 mL/hr over 30 Minutes Intravenous Every 24 hours 07/27/23 0537 08/01/23 0544    07/27/23 0545  azithromycin (ZITHROMAX) 500 mg in dextrose 5 % 250 mL IVPB        500 mg 250 mL/hr over 60 Minutes Intravenous Every 24 hours 07/27/23 0537 08/01/23 0544         Objective:   Vitals:   07/27/23 1959 07/28/23 0050 07/28/23 0334 07/28/23 0830  BP: (!) 147/43 (!) 122/50 (!) 123/111 (!) 151/64  Pulse: (!) 46 (!) 43 (!) 43 64  Resp:  (!) 25 (!) 29 (!) 30  Temp: 99 F (37.2 C) 98.3 F (36.8 C) 98.2 F (36.8 C) 97.8 F (36.6 C)  TempSrc: Oral Oral Oral Oral  SpO2: 98% 93% 93%     Wt Readings from Last 3 Encounters:  03/12/22 81.6 kg  03/11/22 82.3 kg  06/27/20 79.4 kg     Intake/Output Summary (Last 24 hours) at 07/28/2023 0947 Last data filed at 07/27/2023 1531 Gross per 24 hour  Intake --  Output 400 ml  Net -400 ml     Physical Exam  Awake Alert, No new F.N deficits, Normal affect [place].AT,PERRAL Supple Neck, No JVD,   Symmetrical Chest wall movement, Good air movement bilaterally, CTAB RRR,No Gallops,Rubs or new Murmurs,  +ve B.Sounds, Abd Soft, No tenderness,   No Cyanosis, Clubbing or edema        Data Review:    Recent Labs  Lab 07/27/23 0513 07/27/23 0530 07/27/23 1128 07/27/23 2018 07/28/23 0359  WBC  --  11.6* 17.9* 14.9* 11.3*  HGB 14.6  13.9 13.3 12.4* 11.7*  HCT 43.0 41.5 40.8 37.7* 35.7*  PLT  --  277 258 255 234  MCV  --  93.3 92.7 93.3 92.7  MCH  --  31.2 30.2 30.7 30.4  MCHC  --  33.5 32.6 32.9 32.8  RDW  --  12.5 12.6 12.7 12.8  LYMPHSABS  --  0.7  --   --   --   MONOABS  --  0.6  --   --   --   EOSABS  --  0.1  --   --   --   BASOSABS  --  0.0  --   --   --     Recent Labs  Lab 07/27/23 0513 07/27/23 0514 07/27/23 0530 07/27/23 0742 07/28/23 0359 07/28/23 0711  NA 136  --   --   --  137  --   K 4.0  --   --   --  4.3  --   CL 98  --   --   --  100  --   CO2  --   --   --   --  31  --   ANIONGAP  --   --   --   --  6  --   GLUCOSE 177*  --   --   --  113*  --   BUN 20  --   --   --  18  --   CREATININE  1.10  --   --   --  1.26*  --   CRP  --   --   --   --   --  13.4*  LATICACIDVEN  --  2.1*  --  2.1*  --   --   INR  --   --  1.1  --   --   --   BNP  --   --   --   --   --  655.0*  CALCIUM  --   --   --   --  9.1  --       Recent Labs  Lab 07/27/23 0514 07/27/23 0530 07/27/23 0742 07/28/23 0359 07/28/23 0711  CRP  --   --   --   --  13.4*  LATICACIDVEN 2.1*  --  2.1*  --   --   INR  --  1.1  --   --   --   BNP  --   --   --   --  655.0*  CALCIUM  --   --   --  9.1  --     --------------------------------------------------------------------------------------------------------------- No results found for: "CHOL", "HDL", "LDLCALC", "LDLDIRECT", "TRIG", "CHOLHDL"  Lab Results  Component Value Date   HGBA1C 8.0 (H) 06/27/2020      Micro Results Recent Results (from the past 240 hour(s))  Blood Culture (routine x 2)     Status: None (Preliminary result)   Collection Time: 07/27/23  5:16 AM   Specimen: BLOOD  Result Value Ref Range Status   Specimen Description BLOOD BLOOD LEFT ARM  Final   Special Requests   Final    BOTTLES DRAWN AEROBIC AND ANAEROBIC Blood Culture adequate volume   Culture   Final    NO GROWTH 1 DAY Performed at [facility], [address], [place], [place] [address]    Report Status PENDING  Incomplete  Blood Culture (routine x 2)     Status: None (Preliminary result)  Collection Time: 07/27/23  5:21 AM   Specimen: BLOOD  Result Value Ref Range Status   Specimen Description BLOOD BLOOD LEFT HAND  Final   Special Requests   Final    BOTTLES DRAWN AEROBIC AND ANAEROBIC Blood Culture adequate volume   Culture   Final    NO GROWTH 1 DAY Performed at [facility], [address] [place], [place] [address]    Report Status PENDING  Incomplete  Resp panel by RT-PCR (RSV, Flu A&B, Covid) Anterior Nasal Swab     Status: None   Collection Time: 07/27/23  5:34 AM   Specimen: Anterior Nasal Swab  Result Value Ref Range Status   SARS  Coronavirus 2 by RT PCR NEGATIVE NEGATIVE Final   Influenza A by PCR NEGATIVE NEGATIVE Final   Influenza B by PCR NEGATIVE NEGATIVE Final    Comment: (NOTE) The Xpert Xpress SARS-CoV-2/FLU/RSV plus assay is intended as an aid in the diagnosis of influenza from Nasopharyngeal swab specimens and should not be used as a sole basis for treatment. Nasal washings and aspirates are unacceptable for Xpert Xpress SARS-CoV-2/FLU/RSV testing.  Fact Sheet for Patients: BloggerCourse.com  Fact Sheet for Healthcare Providers: SeriousBroker.it  This test is not yet approved or cleared by the [place] FDA and has been authorized for detection and/or diagnosis of SARS-CoV-2 by FDA under an Emergency Use Authorization (EUA). This EUA will remain in effect (meaning this test can be used) for the duration of the COVID-19 declaration under Section 564(b)(1) of the Act, 21 U.S.C. section 360bbb-3(b)(1), unless the authorization is terminated or revoked.     Resp Syncytial Virus by PCR NEGATIVE NEGATIVE Final    Comment: (NOTE) Fact Sheet for Patients: BloggerCourse.com  Fact Sheet for Healthcare Providers: SeriousBroker.it  This test is not yet approved or cleared by the [place] FDA and has been authorized for detection and/or diagnosis of SARS-CoV-2 by FDA under an Emergency Use Authorization (EUA). This EUA will remain in effect (meaning this test can be used) for the duration of the COVID-19 declaration under Section 564(b)(1) of the Act, 21 U.S.C. section 360bbb-3(b)(1), unless the authorization is terminated or revoked.  Performed at [facility], [address] [place], [place] [address]     Radiology Reports DG Chest [place]atz 1 View  Result Date: 07/28/2023 CLINICAL DATA:  Shortness of breath. EXAM: PORTABLE CHEST 1 VIEW COMPARISON:  07/27/2023 FINDINGS: Patchy asymmetric  left greater than right airspace disease seen on yesterday's exam is similar today. Right pleural effusion is similar. The cardio pericardial silhouette is enlarged. No acute bony abnormality. Telemetry leads overlie the chest. IMPRESSION: 1. No significant interval change. 2. Patchy asymmetric left greater than right airspace disease and right pleural effusion. Electronically Signed   By: Kennith Center M.D.   On: 07/28/2023 06:50   DG Chest Port 1 View  Result Date: 07/27/2023 CLINICAL DATA:  Sepsis. EXAM: PORTABLE CHEST 1 VIEW COMPARISON:  03/12/2022 FINDINGS: Diffuse airspace disease in the left lung associated with collapse/consolidative disease in the right base and small right pleural effusion. The cardio pericardial silhouette is enlarged. No acute bony abnormality. Telemetry leads overlie the chest. IMPRESSION: Diffuse airspace disease in the left lung with collapse/consolidative disease in the right base and small right pleural effusion. Imaging features compatible with multifocal pneumonia. Electronically Signed   By: Kennith Center M.D.   On: 07/27/2023 05:36      Signature  -   Susa Raring M.D on 07/28/2023 at  9:47 AM   -  To page go to [email or web address]

## 2023-07-28 NOTE — Progress Notes (Signed)
PT Cancellation Note  Patient Details Name: Jackson [name] MRN: [id number] DOB: 1929/01/17   Cancelled Treatment:    Reason Eval/Treat Not Completed: Patient at procedure or test/unavailable  Patient eating his lunch and asked PT to return later.    Jerolyn Center, PT Acute Rehabilitation Services  Office [phone]   Jackson [name] 07/28/2023, 1:35 PM

## 2023-07-28 NOTE — Evaluation (Signed)
Physical Therapy Evaluation Patient Details Name: Jackson [name] MRN: [id number] DOB: [birth year] Today's Date: 07/28/2023  History of Present Illness  89 y.o. male who comes in 07/27/23 with most likely aspiration PNA after episode of vomiting. Sepsis;  Past Medical History of DM, diabetic neuropathy, HTN, HLD and GERd  Clinical Impression   Pt admitted secondary to problem above with deficits below. PTA patient was living alone and walking without a device inside home and with a cane outside home. He has a ramped entrance.  Pt currently requires CGA to ambulate with RW. Anticipate progress back to no need for device inside home.  Anticipate patient will benefit from PT to address problems listed below.Will continue to follow acutely to maximize functional mobility independence and safety.           If plan is discharge home, recommend the following: Assistance with cooking/housework;Assist for transportation   Can travel by private vehicle        Equipment Recommendations None recommended by PT  Recommendations for Other Services  OT consult    Functional Status Assessment Patient has had a recent decline in their functional status and demonstrates the ability to make significant improvements in function in a reasonable and predictable amount of time.     Precautions / Restrictions Precautions Precautions: Fall      Mobility  Bed Mobility               General bed mobility comments: up in recliner    Transfers Overall transfer level: Needs assistance Equipment used: Rolling walker (2 wheels) Transfers: Sit to/from Stand Sit to Stand: Contact guard assist           General transfer comment: verbal cues for hand placement    Ambulation/Gait Ambulation/Gait assistance: Contact guard assist Gait Distance (Feet): 170 Feet Assistive device: Rolling walker (2 wheels) Gait Pattern/deviations: Step-through pattern, Trunk flexed Gait velocity: decr Gait velocity  interpretation: 1.31 - 2.62 ft/sec, indicative of limited community ambulator   General Gait Details: pushes RW too far ahead (he does not usually use at home)  Careers information officer     Tilt Bed    Modified Rankin (Stroke Patients Only)       Balance Overall balance assessment: Mild deficits observed, not formally tested                                           Pertinent Vitals/Pain Pain Assessment Pain Assessment: No/denies pain    Home Living Family/patient expects to be discharged to:: Private residence Living Arrangements: Alone Available Help at Discharge: Family;Available PRN/intermittently Type of Home: House Home Access: Ramped entrance       Home Layout: Two level;Laundry or work area in basement (does not go down to basement) Home Equipment: Agricultural consultant (2 wheels);Cane - single point;BSC/3in1 Additional Comments: does not use DME at home; takes cane if he goes out    Prior Function Prior Level of Function : Needs assist             Mobility Comments: does not use DME at home; takes cane if he goes out ADLs Comments: son does IADLs     Extremity/Trunk Assessment   Upper Extremity Assessment Upper Extremity Assessment: Generalized weakness    Lower Extremity Assessment Lower Extremity Assessment: Generalized weakness    Cervical /  Trunk Assessment Cervical / Trunk Assessment: Kyphotic  Communication   Communication Communication: Hearing impairment Cueing Techniques: Verbal cues  Cognition Arousal: Alert Behavior During Therapy: WFL for tasks assessed/performed Overall Cognitive Status: Within Functional Limits for tasks assessed                                          General Comments General comments (skin integrity, edema, etc.): on 3L O2; could not get pulse oximeter to read despiite replacement and trying a portable monitor    Exercises     Assessment/Plan    PT  Assessment Patient needs continued PT services  PT Problem List Decreased strength;Decreased balance;Decreased mobility;Decreased knowledge of use of DME;Cardiopulmonary status limiting activity       PT Treatment Interventions DME instruction;Gait training;Functional mobility training;Therapeutic activities;Therapeutic exercise;Balance training;Patient/family education    PT Goals (Current goals can be found in the Care Plan section)  Acute Rehab PT Goals Patient Stated Goal: return to his home with son next door PT Goal Formulation: With patient Time For Goal Achievement: 08/11/23 Potential to Achieve Goals: Good    Frequency Min 1X/week     Co-evaluation               AM-PAC PT "6 Clicks" Mobility  Outcome Measure Help needed turning from your back to your side while in a flat bed without using bedrails?: A Little Help needed moving from lying on your back to sitting on the side of a flat bed without using bedrails?: A Little Help needed moving to and from a bed to a chair (including a wheelchair)?: A Little Help needed standing up from a chair using your arms (e.g., wheelchair or bedside chair)?: A Little Help needed to walk in hospital room?: A Little Help needed climbing 3-5 steps with a railing? : A Little 6 Click Score: 18    End of Session Equipment Utilized During Treatment: Gait belt;Oxygen Activity Tolerance: Patient tolerated treatment well Patient left: in chair;with call bell/phone within reach;with chair alarm set Nurse Communication: Mobility status;Other (comment) (unable to get pulse ox to read) PT Visit Diagnosis: History of falling (Z91.81);Muscle weakness (generalized) (M62.81)    Time: 4098-1191 PT Time Calculation (min) (ACUTE ONLY): 25 min   Charges:   PT Evaluation $PT Eval Low Complexity: 1 Low PT Treatments $Gait Training: 8-22 mins PT General Charges $$ ACUTE PT VISIT: 1 Visit          Jerolyn Center, PT Acute Rehabilitation Services   Office [phone]   Zena Amos 07/28/2023, 3:01 PM

## 2023-07-28 NOTE — Progress Notes (Addendum)
Called by nursing, patient patient in complete heart block.  Heart rate decreased to 38-46.  Patient sleeping, asymptomatic.  On awakening heart rate unchanged, asymptomatic EKG ordered stat.  Currently junctional rhythm. Atropine 1 mg as needed ordered. Monitor Potassium 4.0.  Mag level ordered.  For now 1 g IV mag ordered  With history of bradycardia and transient third-degree heart block..  Cardiology consulted, no intervention planned.  Recommendation conservative management.

## 2023-07-28 NOTE — Progress Notes (Signed)
Speech Language Pathology Treatment: Dysphagia  Patient Details Name: Jackson [name] MRN: [id number] DOB: August 25, 1929 Today's Date: 07/28/2023 Time: 7846-9629 SLP Time Calculation (min) (ACUTE ONLY): 10 min  Assessment / Plan / Recommendation Clinical Impression  Acknowledged new bedside swallow evaluation order. Patient seen by SLP for skilled treatment focused on dysphagia goals. SLP reviewed attending MD's progress note reporting that he spoke with patient's son regarding patient's GERD which causes coughing spells with nausea vomiting and at times some hematemesis with aspiration pneumonia in the past and patient started on clear liquids diet. SLP observed patient with PO intake of thin liquids (water) which he drank via straw sips while sitting upright in recliner. No overt s/s aspiration and swallow initiation appeared timely. Patient reports he is hungry and wondering where his breakfast is. SLP explains the clear liquids diet limitations and patient does not seem to understand this. SLP evaluated patient at bedside for swallow evaluation previous date. Recommend to advance as tolerated and as per MD recommendations but no higher than dysphagia 3 (mechanical soft) solids. SLP will follow.   HPI HPI: Patient is a 89 y.o. male with PMH:  DM, HTN, HLD, GERD, and chronic diastolic CHF. He presented to the ED on 07/27/2023 with hematemesis and concern for aspiration.  Per son's report, patient had incrased coughing overnight and this eventually led to large volume hematemesis. Son also reported that  with patient's history of acid reflux hematemesis is not abnormal after "coughing spells "and occurs every few months. After patient vomited, he had SOB and chills and son called EMS. Upon arrival to ED, patient was hypoxic on his 4L oxygen that he uses PRN at home. He was febrile at 101 F. CXR showed diffuse airspace disease in left lung with associated collapse and consolidative disease in right base and a  small right pleural effusion consistent with multifocal pneumonia. He was made NPO awaiting SLP swallow evaluation.      SLP Plan  Continue with current plan of care      Recommendations for follow up therapy are one component of a multi-disciplinary discharge planning process, led by the attending physician.  Recommendations may be updated based on patient status, additional functional criteria and insurance authorization.    Recommendations  Diet recommendations: Other(comment) (safe on clears, advance as tolerated not higher than dys 3 (mechanical soft)) Medication Administration: Other (Comment) Supervision: Patient able to self feed;Intermittent supervision to cue for compensatory strategies Compensations: Slow rate;Small sips/bites;Minimize environmental distractions Postural Changes and/or Swallow Maneuvers: Seated upright 90 degrees;Upright 30-60 min after meal                  Oral care BID   Intermittent Supervision/Assistance Dysphagia, unspecified (R13.10)     Continue with current plan of care     Angela Nevin, MA, CCC-SLP Speech Therapy

## 2023-07-29 ENCOUNTER — Encounter (HOSPITAL_COMMUNITY): Payer: Self-pay | Admitting: Family Medicine

## 2023-07-29 DIAGNOSIS — J189 Pneumonia, unspecified organism: Secondary | ICD-10-CM | POA: Diagnosis not present

## 2023-07-29 LAB — BASIC METABOLIC PANEL
Anion gap: 9 (ref 5–15)
BUN: 16 mg/dL (ref 8–23)
CO2: 27 mmol/L (ref 22–32)
Calcium: 9.1 mg/dL (ref 8.9–10.3)
Chloride: 98 mmol/L (ref 98–111)
Creatinine, Ser: 1.2 mg/dL (ref 0.61–1.24)
GFR, Estimated: 57 mL/min — ABNORMAL LOW (ref >60)
Glucose, Bld: 167 mg/dL — ABNORMAL HIGH (ref 70–99)
Potassium: 3.8 mmol/L (ref 3.5–5.1)
Sodium: 134 mmol/L — ABNORMAL LOW (ref 135–145)

## 2023-07-29 LAB — CBC WITH DIFFERENTIAL/PLATELET
Abs Immature Granulocytes: 0.05 10*3/uL (ref 0.00–0.07)
Basophils Absolute: 0 10*3/uL (ref 0.0–0.1)
Basophils Relative: 0 %
Eosinophils Absolute: 0.3 10*3/uL (ref 0.0–0.5)
Eosinophils Relative: 3 %
HCT: 35.3 % — ABNORMAL LOW (ref 39.0–52.0)
Hemoglobin: 11.4 g/dL — ABNORMAL LOW (ref 13.0–17.0)
Immature Granulocytes: 1 %
Lymphocytes Relative: 9 %
Lymphs Abs: 0.9 10*3/uL (ref 0.7–4.0)
MCH: 29.7 pg (ref 26.0–34.0)
MCHC: 32.3 g/dL (ref 30.0–36.0)
MCV: 91.9 fL (ref 80.0–100.0)
Monocytes Absolute: 1 10*3/uL (ref 0.1–1.0)
Monocytes Relative: 10 %
Neutro Abs: 7.4 10*3/uL (ref 1.7–7.7)
Neutrophils Relative %: 77 %
Platelets: 217 10*3/uL (ref 150–400)
RBC: 3.84 MIL/uL — ABNORMAL LOW (ref 4.22–5.81)
RDW: 12.8 % (ref 11.5–15.5)
WBC: 9.6 10*3/uL (ref 4.0–10.5)
nRBC: 0 % (ref 0.0–0.2)

## 2023-07-29 LAB — GLUCOSE, CAPILLARY
Glucose-Capillary: 134 mg/dL — ABNORMAL HIGH (ref 70–99)
Glucose-Capillary: 175 mg/dL — ABNORMAL HIGH (ref 70–99)
Glucose-Capillary: 185 mg/dL — ABNORMAL HIGH (ref 70–99)
Glucose-Capillary: 200 mg/dL — ABNORMAL HIGH (ref 70–99)
Glucose-Capillary: 207 mg/dL — ABNORMAL HIGH (ref 70–99)
Glucose-Capillary: 285 mg/dL — ABNORMAL HIGH (ref 70–99)

## 2023-07-29 LAB — C-REACTIVE PROTEIN: CRP: 14.7 mg/dL — ABNORMAL HIGH (ref <1.0)

## 2023-07-29 LAB — MAGNESIUM: Magnesium: 1.7 mg/dL (ref 1.7–2.4)

## 2023-07-29 LAB — PHOSPHORUS: Phosphorus: 2.5 mg/dL (ref 2.5–4.6)

## 2023-07-29 LAB — BRAIN NATRIURETIC PEPTIDE: B Natriuretic Peptide: 319 pg/mL — ABNORMAL HIGH (ref 0.0–100.0)

## 2023-07-29 LAB — PROCALCITONIN: Procalcitonin: 2.56 ng/mL

## 2023-07-29 NOTE — TOC Initial Note (Addendum)
Transition of Care [facility]) - Initial/Assessment Note    Patient Details  Name: Jackson [name] MRN: [id number] Date of Birth: 1929/02/05  Transition of Care [facility]) CM/SW Contact:    Lawerance Sabal, RN Phone Number: 07/29/2023, 12:25 PM  Clinical Narrative:                  Jackson [name] w patient's son over the phone to coordinate DC planning.  He states that lives right behind him and monitors him at night. He is fairly independent, drives.  They are interested in [facility] services, and defer choice to CM. [facility] able to accept referral. They decline need for any DME      Expected Discharge Plan: Home w Home Health Services Barriers to Discharge: Continued Medical Work up   Patient Goals and CMS Choice Patient states their goals for this hospitalization and ongoing recovery are:: to go home CMS Medicare.gov Compare Post Acute Care list provided to:: Other (Comment Required) Choice offered to / list presented to : Adult Children      Expected Discharge Plan and Services   Discharge Planning Services: CM Consult   Living arrangements for the past 2 months: Single Family Home                 DME Arranged: N/A         HH Arranged: PT, OT HH Agency: [facility] Date HH Agency Contacted: 07/29/23 Time HH Agency Contacted: 1225 Representative spoke with at [facility] Agency: Amy  Prior Living Arrangements/Services Living arrangements for the past 2 months: Single Family Home Lives with:: Self   Do you feel safe going back to the place where you live?: Yes               Activities of Daily Living      Permission Sought/Granted                  Emotional Assessment              Admission diagnosis:  Acute respiratory failure with hypoxia (HCC) [J96.01] Multifocal pneumonia [J18.9] Community acquired pneumonia of left upper lobe of lung [J18.9] Patient Active Problem List   Diagnosis Date Noted   Multifocal pneumonia 07/27/2023   Hematemesis 07/27/2023   GERD  (gastroesophageal reflux disease) 07/27/2023   CHB (complete heart block) (HCC)    Rhinovirus infection 03/07/2022   Hyponatremia 03/07/2022   Pneumonia 03/07/2022   Fever 03/06/2022   Elevated serum creatinine 03/06/2022   DNR (do not resuscitate) 03/06/2022   Demand ischemia (HCC)    Elevated troponin 06/28/2020   Sepsis (HCC) 06/27/2020   Aspiration into airway 01/22/2019   Essential hypertension 11/18/2018   Dyslipidemia 11/18/2018   Chronic diastolic CHF (congestive heart failure) (HCC) 11/18/2018   Status post total replacement of left hip 09/24/2017   Pain in left hip 08/11/2017   Unilateral primary osteoarthritis, left hip 08/11/2017   Complete heart block (HCC) 12/16/2016   Atherosclerosis of aorta (HCC) 12/16/2016   Aortic stenosis    Severe sepsis (HCC) 02/25/2015   Bilateral pneumonia 02/25/2015   Type 2 diabetes mellitus (HCC) 02/25/2015   Lactic acidosis 02/25/2015   Acute hypoxic respiratory failure (HCC) 02/25/2015   Sepsis due to pneumonia (HCC) 02/25/2015   Acute on chronic respiratory failure with hypoxia (HCC)    CAP (community acquired pneumonia)    SOB (shortness of breath)    Type 2 diabetes mellitus with hyperglycemia (HCC)    PCP:  Eleanora Neighbor  A, MD Pharmacy:   [id number] [place], [place] - [address] [place] [address] Phone: [phone] Fax: [phone]  [facility] - [place], [place] - [address] [place] [address] Phone: [phone] Fax: [phone]     Social Determinants of Health (SDOH) Social History: SDOH Screenings   Tobacco Use: Medium Risk (07/27/2023)   SDOH Interventions:     Readmission Risk Interventions     No data to display

## 2023-07-29 NOTE — Evaluation (Signed)
Occupational Therapy Evaluation Patient Details Name: Jackson [name] MRN: [id number] DOB: 12-31-1928 Today's Date: 07/29/2023   History of Present Illness 89 y.o. male who comes in 07/27/23 with most likely aspiration PNA after episode of vomiting. Sepsis;  Past Medical History of DM, diabetic neuropathy, HTN, HLD and GERd   Clinical Impression   Pt admitted for above, he lives alone and reports being ind/mod I in ADLs and mobility using SPC>. He presents as generally weak,  and needed Mod A for LBD but normally does not wear socks so actual LBD assist may be less than indicated. Pt ambulating with CGA, hard to assess Sp02 due to consistent poor plets but pt placed on supplemental 02 for ambulation for safety based on recent notes. He does need cues to maintain BOS within RW when turning, reports using SPC. Pt would benefit from continued acute skilled OT services to address deficits and help educate pt on safety prn. Patient would benefit from post acute Home OT services to help maximize functional independence in natural environment       If plan is discharge home, recommend the following: Assistance with cooking/housework;Direct supervision/assist for medications management    Functional Status Assessment  Patient has had a recent decline in their functional status and demonstrates the ability to make significant improvements in function in a reasonable and predictable amount of time.  Equipment Recommendations  Tub/shower seat;Tub/shower bench (Would benefit from tub bench more, if not able to acquire then shower seat.)    Recommendations for Other Services       Precautions / Restrictions Precautions Precautions: Fall Restrictions Weight Bearing Restrictions: No      Mobility Bed Mobility               General bed mobility comments: up in recliner    Transfers Overall transfer level: Needs assistance Equipment used: Rolling walker (2 wheels) Transfers: Sit to/from  Stand Sit to Stand: Contact guard assist           General transfer comment: pt needs continued reinforcement of safe RW BOS when making turns. Does not stay in between the RW      Balance Overall balance assessment: Needs assistance Sitting-balance support: Feet supported Sitting balance-Leahy Scale: Fair Sitting balance - Comments: in recliner, not fully assessed     Standing balance-Leahy Scale: Fair                             ADL either performed or assessed with clinical judgement   ADL Overall ADL's : Needs assistance/impaired Eating/Feeding: Independent;Sitting   Grooming: Standing;Contact guard assist   Upper Body Bathing: Sitting;Supervision/ safety;Set up   Lower Body Bathing: Sitting/lateral leans;Set up;Supervison/ safety   Upper Body Dressing : Sitting;Set up   Lower Body Dressing: Moderate assistance;Sitting/lateral leans Lower Body Dressing Details (indicate cue type and reason): wears slip on shoes at baseline, does not wear socks. Difficulty donning/doffing L sock due to decreased L hip mobility Toilet Transfer: Rolling walker (2 wheels);Ambulation;Contact guard assist   Toileting- Clothing Manipulation and Hygiene: Contact guard assist;Sit to/from stand       Functional mobility during ADLs: Contact guard assist;Rolling walker (2 wheels)       Vision         Perception         Praxis         Pertinent Vitals/Pain Pain Assessment Pain Assessment: No/denies pain     Extremity/Trunk Assessment Upper  Extremity Assessment Upper Extremity Assessment: Generalized weakness   Lower Extremity Assessment Lower Extremity Assessment: Generalized weakness   Cervical / Trunk Assessment Cervical / Trunk Assessment: Kyphotic   Communication Communication Communication: Hearing impairment (has hearing aides, they were charging during session) Cueing Techniques: Verbal cues   Cognition Arousal: Alert Behavior During Therapy: WFL  for tasks assessed/performed Overall Cognitive Status: Difficult to assess                                       General Comments  Poor Sp02 pleth despite consistent readjustments, digital pulse ox not reading. Placed on 2L 02 while ambulating for safety, montior with poor pleth and indicating desat but pt denying signs or symptoms    Exercises     Shoulder Instructions      Home Living Family/patient expects to be discharged to:: Private residence Living Arrangements: Alone (son lives in garage apartment) Available Help at Discharge: Family;Available PRN/intermittently Type of Home: House Home Access: Ramped entrance     Home Layout: Two level;Laundry or work area in basement (does not go down to basement)     Bathroom Shower/Tub: Chief Strategy Officer: Standard     Home Equipment: Agricultural consultant (2 wheels);Cane - single point;BSC/3in1   Additional Comments: does not use DME at home; takes cane if he goes out      Prior Functioning/Environment Prior Level of Function : Needs assist             Mobility Comments: does not use DME at home; takes cane if he goes out ADLs Comments: . ind ADLs and son does IADLs        OT Problem List: Impaired balance (sitting and/or standing);Decreased strength      OT Treatment/Interventions: Self-care/ADL training;Therapeutic exercise;Balance training;Therapeutic activities;Patient/family education    OT Goals(Current goals can be found in the care plan section) Acute Rehab OT Goals Patient Stated Goal: To go home OT Goal Formulation: With patient Time For Goal Achievement: 08/12/23 Potential to Achieve Goals: Good ADL Goals Pt Will Perform Grooming: with modified independence;standing Pt Will Perform Lower Body Bathing: with modified independence;sit to/from stand Pt Will Perform Lower Body Dressing: with modified independence;sit to/from stand Pt Will Transfer to Toilet: with modified  independence;ambulating  OT Frequency: Min 1X/week    Co-evaluation              AM-PAC OT "6 Clicks" Daily Activity     Outcome Measure Help from another person eating meals?: None Help from another person taking care of personal grooming?: A Little Help from another person toileting, which includes using toliet, bedpan, or urinal?: A Little Help from another person bathing (including washing, rinsing, drying)?: A Little Help from another person to put on and taking off regular upper body clothing?: A Little Help from another person to put on and taking off regular lower body clothing?: A Lot 6 Click Score: 18   End of Session Equipment Utilized During Treatment: Gait belt;Rolling walker (2 wheels);Oxygen Nurse Communication: Mobility status  Activity Tolerance: Patient tolerated treatment well Patient left: in chair;with call bell/phone within reach;with chair alarm set  OT Visit Diagnosis: Unsteadiness on feet (R26.81);Other abnormalities of gait and mobility (R26.89);Muscle weakness (generalized) (M62.81)                Time: 9528-4132 OT Time Calculation (min): 35 min Charges:  OT General Charges $OT Visit:  1 Visit OT Evaluation $OT Eval Moderate Complexity: 1 Mod OT Treatments $Therapeutic Activity: 8-22 mins  07/29/2023  AB, OTR/L  Acute Rehabilitation Services  Office: [phone]   Tristan Schroeder 07/29/2023, 4:35 PM

## 2023-07-29 NOTE — Progress Notes (Signed)
Mobility Specialist Progress Note;   07/29/23 1005  Mobility  Activity Ambulated with assistance in hallway  Level of Assistance Minimal assist, patient does 75% or more  Assistive Device Front wheel walker  Distance Ambulated (ft) 315 ft  Activity Response Tolerated well  Mobility Referral Yes  $Mobility charge 1 Mobility  Mobility Specialist Start Time (ACUTE ONLY) 1005  Mobility Specialist Stop Time (ACUTE ONLY) 1030  Mobility Specialist Time Calculation (min) (ACUTE ONLY) 25 min    Pre-mobility: SPO2 95% 3LO2 During-mobility: SPO2 89-92% 6LO2 Post-mobility: SPO2 95% 3LO2  Pt agreeable to mobility. Required minA for STS and minG during ambulation. Ambulated on 4LO2, desat to 80% with no displayed SOB. Increased to 6LO2 and quickly recovered to 92%. Required mod verbal cues for RW proximity while ambulating. No c/o during session. Pt back in chair with all needs met, left on 3LO2. Alarm on.   Caesar Bookman Mobility Specialist Please contact via SecureChat or Rehab Office [phone]

## 2023-07-29 NOTE — Progress Notes (Signed)
PROGRESS NOTE                                                                                                                                                                                                             Patient Demographics:    Jackson [name], is a 89 y.o. male, DOB - 1929/05/27, UJW:[id number]  Outpatient Primary MD for the patient is Emilio Aspen, MD    LOS - 2  Admit date - 07/27/2023    Chief Complaint  Patient presents with   Hematemesis       Brief Narrative (HPI from H&P)    89 y.o. male with medical history significant of  DM, HTN, HLD, GERD, and chronic diastolic CHF presenting with hematemesis and concern for aspiration.  Overnight son noted his having increased coughing which eventually precipitated what was reported as large-volume hematemesis.  Son reports that with patient's history of acid reflux hematemesis is not abnormal after "coughing spells "and occurs every few months.  After he vomited he later noted shortness of breath and chills and son decided to call EMS.  On EMS arrival patient was noted to be hypoxic on 4 L nasal cannula that he uses as needed at home.  Patient and son denies known sick contacts or recent URI symptoms.  He came to the hospital he was diagnosed with aspiration pneumonia and admitted to the hospital.   Subjective:   Patient in bed, appears comfortable, denies any headache, no fever, no chest pain or pressure, no shortness of breath , no abdominal pain. No focal weakness.   Assessment  & Plan :    #Sepsis due to aspiration pneumonia present on admission. Patient had history of GERD causing coughing spells with nausea vomiting and at times some hematemesis with aspiration pneumonia in the past, discussed with son, currently nausea vomiting better, no GI complaints, advance to clear liquid diet, speech follow-up, Unasyn, IV PPI and monitor.  Since pathophysiology  has completely resolved, clinically improving continue present line of care, encouraged to sit in chair use I-S and flutter valve for pulmonary toiletry, if remains stable likely discharge home on 07/29/2023.   #Acute Hypoxic Respiratory Failure secondary to Pneumonia - Treat as above, oxygen as needed uses as needed oxygen at home.   #Hematemesis #GERD - IV Protonix -Previous history of same with coughing  spells, question if he develops a Mallory-Weiss tear with nausea vomiting, type screen, monitor CBC, no signs of ongoing bleed, outpatient GI follow-up and workup as desired.   #Hypertension -Heart Norvasc and monitor.  #Hyperlipidemia -Continue home Crestor when cleared for PO #Type II Diabetes Mellitus #Diabetic Neuropathy -q4H CBG and SSI while NPO - Resume gabapentin when cleared for PO  CBG (last 3)  Recent Labs    07/28/23 2356 07/29/23 0332 07/29/23 0802  GLUCAP 145* 175* 134*            Condition - Fair  Family Communication  :   son on 07/28/23 - [phone] on 07/28/23  Code Status :   DNR  Consults  :     PUD Prophylaxis :  PPI   Procedures  :            Disposition Plan  :    Status is: Inpatient   DVT Prophylaxis  :    SCDs Start: 07/27/23 1024    Lab Results  Component Value Date   PLT 217 07/29/2023    Diet :  Diet Order             DIET DYS 3 Room service appropriate? Yes; Fluid consistency: Thin  Diet effective now                    Inpatient Medications  Scheduled Meds:  amLODipine  10 mg Oral Daily   [START ON 08/02/2023] aspirin EC  81 mg Oral Daily   fluticasone  2 spray Each Nare BID   gabapentin  300 mg Oral TID   insulin aspart  0-9 Units Subcutaneous Q4H   pantoprazole (PROTONIX) IV  40 mg Intravenous Q12H   rosuvastatin  10 mg Oral QHS   tamsulosin  0.4 mg Oral QPC supper   Continuous Infusions:  ampicillin-sulbactam (UNASYN) IV 3 g (07/29/23 0524)   PRN Meds:.acetaminophen, atropine,  ipratropium-albuterol, [DISCONTINUED] ondansetron **OR** ondansetron (ZOFRAN) IV, polyvinyl alcohol  Antibiotics  :    Anti-infectives (From admission, onward)    Start     Dose/Rate Route Frequency Ordered Stop   07/28/23 1200  Ampicillin-Sulbactam (UNASYN) 3 g in sodium chloride 0.9 % 100 mL IVPB        3 g 200 mL/hr over 30 Minutes Intravenous Every 6 hours 07/28/23 1034     07/27/23 0545  cefTRIAXone (ROCEPHIN) 2 g in sodium chloride 0.9 % 100 mL IVPB  Status:  Discontinued        2 g 200 mL/hr over 30 Minutes Intravenous Every 24 hours 07/27/23 0537 07/28/23 0957   07/27/23 0545  azithromycin (ZITHROMAX) 500 mg in dextrose 5 % 250 mL IVPB  Status:  Discontinued        500 mg 250 mL/hr over 60 Minutes Intravenous Every 24 hours 07/27/23 0537 07/28/23 0957         Objective:   Vitals:   07/28/23 1600 07/28/23 2009 07/29/23 0326 07/29/23 0800  BP: (!) 152/42  (!) 147/45 (!) 145/59  Pulse: (!) 52   (!) 48  Resp: 18   (!) 25  Temp: 98 F (36.7 C) 97.9 F (36.6 C) 98 F (36.7 C) 98 F (36.7 C)  TempSrc: Oral Oral Oral Oral  SpO2:        Wt Readings from Last 3 Encounters:  03/12/22 81.6 kg  03/11/22 82.3 kg  06/27/20 79.4 kg     Intake/Output Summary (Last 24 hours) at 07/29/2023 1017 Last data  filed at 07/29/2023 0142 Gross per 24 hour  Intake 920.06 ml  Output 150 ml  Net 770.06 ml     Physical Exam  Awake Alert, No new F.N deficits, Normal affect [place].AT,PERRAL Supple Neck, No JVD,   Symmetrical Chest wall movement, Good air movement bilaterally, CTAB RRR,No Gallops, Rubs or new Murmurs,  +ve B.Sounds, Abd Soft, No tenderness,   No Cyanosis, Clubbing or edema         Data Review:    Recent Labs  Lab 07/27/23 0530 07/27/23 1128 07/27/23 2018 07/28/23 0359 07/29/23 0506  WBC 11.6* 17.9* 14.9* 11.3* 9.6  HGB 13.9 13.3 12.4* 11.7* 11.4*  HCT 41.5 40.8 37.7* 35.7* 35.3*  PLT 277 258 255 234 217  MCV 93.3 92.7 93.3 92.7 91.9  MCH 31.2 30.2 30.7  30.4 29.7  MCHC 33.5 32.6 32.9 32.8 32.3  RDW 12.5 12.6 12.7 12.8 12.8  LYMPHSABS 0.7  --   --   --  0.9  MONOABS 0.6  --   --   --  1.0  EOSABS 0.1  --   --   --  0.3  BASOSABS 0.0  --   --   --  0.0    Recent Labs  Lab 07/27/23 0513 07/27/23 0514 07/27/23 0530 07/27/23 0742 07/28/23 0359 07/28/23 0711 07/29/23 0506  NA 136  --   --   --  137  --  134*  K 4.0  --   --   --  4.3  --  3.8  CL 98  --   --   --  100  --  98  CO2  --   --   --   --  31  --  27  ANIONGAP  --   --   --   --  6  --  9  GLUCOSE 177*  --   --   --  113*  --  167*  BUN 20  --   --   --  18  --  16  CREATININE 1.10  --   --   --  1.26*  --  1.20  CRP  --   --   --   --   --  13.4* 14.7*  PROCALCITON  --   --   --   --   --  4.18 2.56  LATICACIDVEN  --  2.1*  --  2.1*  --   --   --   INR  --   --  1.1  --   --   --   --   BNP  --   --   --   --   --  655.0* 319.0*  MG  --   --   --   --   --   --  1.7  CALCIUM  --   --   --   --  9.1  --  9.1      Recent Labs  Lab 07/27/23 0514 07/27/23 0530 07/27/23 0742 07/28/23 0359 07/28/23 0711 07/29/23 0506  CRP  --   --   --   --  13.4* 14.7*  PROCALCITON  --   --   --   --  4.18 2.56  LATICACIDVEN 2.1*  --  2.1*  --   --   --   INR  --  1.1  --   --   --   --   BNP  --   --   --   --  655.0* 319.0*  MG  --   --   --   --   --  1.7  CALCIUM  --   --   --  9.1  --  9.1    --------------------------------------------------------------------------------------------------------------- No results found for: "CHOL", "HDL", "LDLCALC", "LDLDIRECT", "TRIG", "CHOLHDL"  Lab Results  Component Value Date   HGBA1C 8.0 (H) 06/27/2020      Micro Results Recent Results (from the past 240 hour(s))  Blood Culture (routine x 2)     Status: None (Preliminary result)   Collection Time: 07/27/23  5:16 AM   Specimen: BLOOD  Result Value Ref Range Status   Specimen Description BLOOD BLOOD LEFT ARM  Final   Special Requests   Final    BOTTLES DRAWN AEROBIC AND  ANAEROBIC Blood Culture adequate volume   Culture   Final    NO GROWTH 2 DAYS Performed at [facility], [address] [place], [place] [address]    Report Status PENDING  Incomplete  Blood Culture (routine x 2)     Status: None (Preliminary result)   Collection Time: 07/27/23  5:21 AM   Specimen: BLOOD  Result Value Ref Range Status   Specimen Description BLOOD BLOOD LEFT HAND  Final   Special Requests   Final    BOTTLES DRAWN AEROBIC AND ANAEROBIC Blood Culture adequate volume   Culture   Final    NO GROWTH 2 DAYS Performed at [facility], [address], [place], [place] [address]    Report Status PENDING  Incomplete  Resp panel by RT-PCR (RSV, Flu A&B, Covid) Anterior Nasal Swab     Status: None   Collection Time: 07/27/23  5:34 AM   Specimen: Anterior Nasal Swab  Result Value Ref Range Status   SARS Coronavirus 2 by RT PCR NEGATIVE NEGATIVE Final   Influenza A by PCR NEGATIVE NEGATIVE Final   Influenza B by PCR NEGATIVE NEGATIVE Final    Comment: (NOTE) The Xpert Xpress SARS-CoV-2/FLU/RSV plus assay is intended as an aid in the diagnosis of influenza from Nasopharyngeal swab specimens and should not be used as a sole basis for treatment. Nasal washings and aspirates are unacceptable for Xpert Xpress SARS-CoV-2/FLU/RSV testing.  Fact Sheet for Patients: BloggerCourse.com  Fact Sheet for Healthcare Providers: SeriousBroker.it  This test is not yet approved or cleared by the [place] FDA and has been authorized for detection and/or diagnosis of SARS-CoV-2 by FDA under an Emergency Use Authorization (EUA). This EUA will remain in effect (meaning this test can be used) for the duration of the COVID-19 declaration under Section 564(b)(1) of the Act, 21 U.S.C. section 360bbb-3(b)(1), unless the authorization is terminated or revoked.     Resp Syncytial Virus by PCR NEGATIVE NEGATIVE Final    Comment:  (NOTE) Fact Sheet for Patients: BloggerCourse.com  Fact Sheet for Healthcare Providers: SeriousBroker.it  This test is not yet approved or cleared by the [place] FDA and has been authorized for detection and/or diagnosis of SARS-CoV-2 by FDA under an Emergency Use Authorization (EUA). This EUA will remain in effect (meaning this test can be used) for the duration of the COVID-19 declaration under Section 564(b)(1) of the Act, 21 U.S.C. section 360bbb-3(b)(1), unless the authorization is terminated or revoked.  Performed at [facility], [address], [place], [place] [address]     Radiology Reports DG Chest [place] 1 View  Result Date: 07/28/2023 CLINICAL DATA:  Shortness of breath. EXAM: PORTABLE CHEST 1  VIEW COMPARISON:  07/27/2023 FINDINGS: Patchy asymmetric left greater than right airspace disease seen on yesterday's exam is similar today. Right pleural effusion is similar. The cardio pericardial silhouette is enlarged. No acute bony abnormality. Telemetry leads overlie the chest. IMPRESSION: 1. No significant interval change. 2. Patchy asymmetric left greater than right airspace disease and right pleural effusion. Electronically Signed   By: Kennith Center M.D.   On: 07/28/2023 06:50   DG Chest Port 1 View  Result Date: 07/27/2023 CLINICAL DATA:  Sepsis. EXAM: PORTABLE CHEST 1 VIEW COMPARISON:  03/12/2022 FINDINGS: Diffuse airspace disease in the left lung associated with collapse/consolidative disease in the right base and small right pleural effusion. The cardio pericardial silhouette is enlarged. No acute bony abnormality. Telemetry leads overlie the chest. IMPRESSION: Diffuse airspace disease in the left lung with collapse/consolidative disease in the right base and small right pleural effusion. Imaging features compatible with multifocal pneumonia. Electronically Signed   By: Kennith Center M.D.   On: 07/27/2023 05:36       Signature  -   Susa Raring M.D on 07/29/2023 at 10:17 AM   -  To page go to [email or web address]

## 2023-07-29 NOTE — Plan of Care (Signed)
  Problem: Education: Goal: Ability to describe self-care measures that may prevent or decrease complications (Diabetes Survival Skills Education) will improve Outcome: Progressing Goal: Individualized Educational Video(s) Outcome: Progressing   Problem: Coping: Goal: Ability to adjust to condition or change in health will improve Outcome: Progressing   Problem: Fluid Volume: Goal: Ability to maintain a balanced intake and output will improve Outcome: Progressing   Problem: Health Behavior/Discharge Planning: Goal: Ability to identify and utilize available resources and services will improve Outcome: Progressing Goal: Ability to manage health-related needs will improve Outcome: Progressing   Problem: Metabolic: Goal: Ability to maintain appropriate glucose levels will improve Outcome: Progressing   Problem: Nutritional: Goal: Maintenance of adequate nutrition will improve Outcome: Progressing Goal: Progress toward achieving an optimal weight will improve Outcome: Progressing   Problem: Skin Integrity: Goal: Risk for impaired skin integrity will decrease Outcome: Progressing   Problem: Tissue Perfusion: Goal: Adequacy of tissue perfusion will improve Outcome: Progressing   Problem: Activity: Goal: Ability to tolerate increased activity will improve Outcome: Progressing   Problem: Clinical Measurements: Goal: Ability to maintain a body temperature in the normal range will improve Outcome: Progressing   Problem: Respiratory: Goal: Ability to maintain adequate ventilation will improve Outcome: Progressing Goal: Ability to maintain a clear airway will improve Outcome: Progressing   Problem: Education: Goal: Knowledge of General Education information will improve Description: Including pain rating scale, medication(s)/side effects and non-pharmacologic comfort measures Outcome: Progressing   Problem: Health Behavior/Discharge Planning: Goal: Ability to manage  health-related needs will improve Outcome: Progressing   Problem: Clinical Measurements: Goal: Ability to maintain clinical measurements within normal limits will improve Outcome: Progressing Goal: Will remain free from infection Outcome: Progressing Goal: Diagnostic test results will improve Outcome: Progressing Goal: Respiratory complications will improve Outcome: Progressing Goal: Cardiovascular complication will be avoided Outcome: Progressing   Problem: Clinical Measurements: Goal: Ability to maintain clinical measurements within normal limits will improve Outcome: Progressing Goal: Will remain free from infection Outcome: Progressing Goal: Diagnostic test results will improve Outcome: Progressing Goal: Respiratory complications will improve Outcome: Progressing Goal: Cardiovascular complication will be avoided Outcome: Progressing   Problem: Activity: Goal: Risk for activity intolerance will decrease Outcome: Progressing   Problem: Nutrition: Goal: Adequate nutrition will be maintained Outcome: Progressing   Problem: Coping: Goal: Level of anxiety will decrease Outcome: Progressing   Problem: Elimination: Goal: Will not experience complications related to bowel motility Outcome: Progressing Goal: Will not experience complications related to urinary retention Outcome: Progressing   Problem: Pain Management: Goal: General experience of comfort will improve Outcome: Progressing   Problem: Safety: Goal: Ability to remain free from injury will improve Outcome: Progressing   Problem: Skin Integrity: Goal: Risk for impaired skin integrity will decrease Outcome: Progressing

## 2023-07-29 NOTE — Progress Notes (Signed)
Speech Language Pathology Treatment: Dysphagia  Patient Details Name: Jackson [name] MRN: [id number] DOB: 06-18-1929 Today's Date: 07/29/2023 Time: 6213-0865 SLP Time Calculation (min) (ACUTE ONLY): 10 min  Assessment / Plan / Recommendation Clinical Impression  Patient seen by SLP for skilled treatment focused on dysphagia goals. Patient was sitting on toilet with tray table in bathroom and he was eating his lunch. He told SLP he was hoping that eating would "help force something out" and that he has been constipated. Patient able to feed self dys 3 (mechanical soft) solids, thin liquids with no overt s/s aspiration and timely swallow initiation. Son arrived and he reported that patient's reflux is typically not a problem unless he eats something spicy/acidic and then lays down for bed. Son added that currently patient lives alone but son and daughter in law live in an apartment behind main house. Plan for discharge is for son and daughter in law to spend more time directly supervising and assisting patient. SLP to s/o at this time as all goals met.    HPI HPI: Patient is a 89 y.o. male with PMH:  DM, HTN, HLD, GERD, and chronic diastolic CHF. He presented to the ED on 07/27/2023 with hematemesis and concern for aspiration.  Per son's report, patient had incrased coughing overnight and this eventually led to large volume hematemesis. Son also reported that  with patient's history of acid reflux hematemesis is not abnormal after "coughing spells "and occurs every few months. After patient vomited, he had SOB and chills and son called EMS. Upon arrival to ED, patient was hypoxic on his 4L oxygen that he uses PRN at home. He was febrile at 101 F. CXR showed diffuse airspace disease in left lung with associated collapse and consolidative disease in right base and a small right pleural effusion consistent with multifocal pneumonia. He was made NPO awaiting SLP swallow evaluation.      SLP Plan  All goals  met      Recommendations for follow up therapy are one component of a multi-disciplinary discharge planning process, led by the attending physician.  Recommendations may be updated based on patient status, additional functional criteria and insurance authorization.    Recommendations  Diet recommendations: Dysphagia 3 (mechanical soft);Thin liquid;Other(comment) (could be advanced to regular solids if dentures present) Liquids provided via: Cup;Straw Medication Administration: Whole meds with liquid Supervision: Patient able to self feed Compensations: Slow rate;Small sips/bites Postural Changes and/or Swallow Maneuvers: Seated upright 90 degrees;Upright 30-60 min after meal                  Oral care BID   None Dysphagia, unspecified (R13.10)     All goals met   Angela Nevin, MA, CCC-SLP Speech Therapy

## 2023-07-30 ENCOUNTER — Other Ambulatory Visit (HOSPITAL_COMMUNITY): Payer: Self-pay

## 2023-07-30 DIAGNOSIS — J189 Pneumonia, unspecified organism: Secondary | ICD-10-CM | POA: Diagnosis not present

## 2023-07-30 LAB — GLUCOSE, CAPILLARY
Glucose-Capillary: 166 mg/dL — ABNORMAL HIGH (ref 70–99)
Glucose-Capillary: 191 mg/dL — ABNORMAL HIGH (ref 70–99)

## 2023-07-30 LAB — BASIC METABOLIC PANEL
Anion gap: 8 (ref 5–15)
BUN: 16 mg/dL (ref 8–23)
CO2: 28 mmol/L (ref 22–32)
Calcium: 9.1 mg/dL (ref 8.9–10.3)
Chloride: 99 mmol/L (ref 98–111)
Creatinine, Ser: 1.17 mg/dL (ref 0.61–1.24)
GFR, Estimated: 58 mL/min — ABNORMAL LOW (ref >60)
Glucose, Bld: 180 mg/dL — ABNORMAL HIGH (ref 70–99)
Potassium: 3.8 mmol/L (ref 3.5–5.1)
Sodium: 135 mmol/L (ref 135–145)

## 2023-07-30 LAB — CBC WITH DIFFERENTIAL/PLATELET
Abs Immature Granulocytes: 0.04 10*3/uL (ref 0.00–0.07)
Basophils Absolute: 0 10*3/uL (ref 0.0–0.1)
Basophils Relative: 0 %
Eosinophils Absolute: 0.5 10*3/uL (ref 0.0–0.5)
Eosinophils Relative: 6 %
HCT: 32.6 % — ABNORMAL LOW (ref 39.0–52.0)
Hemoglobin: 10.9 g/dL — ABNORMAL LOW (ref 13.0–17.0)
Immature Granulocytes: 1 %
Lymphocytes Relative: 15 %
Lymphs Abs: 1.2 10*3/uL (ref 0.7–4.0)
MCH: 30.7 pg (ref 26.0–34.0)
MCHC: 33.4 g/dL (ref 30.0–36.0)
MCV: 91.8 fL (ref 80.0–100.0)
Monocytes Absolute: 1.1 10*3/uL — ABNORMAL HIGH (ref 0.1–1.0)
Monocytes Relative: 14 %
Neutro Abs: 5.3 10*3/uL (ref 1.7–7.7)
Neutrophils Relative %: 64 %
Platelets: 212 10*3/uL (ref 150–400)
RBC: 3.55 MIL/uL — ABNORMAL LOW (ref 4.22–5.81)
RDW: 12.4 % (ref 11.5–15.5)
WBC: 8.1 10*3/uL (ref 4.0–10.5)
nRBC: 0 % (ref 0.0–0.2)

## 2023-07-30 LAB — MAGNESIUM: Magnesium: 1.8 mg/dL (ref 1.7–2.4)

## 2023-07-30 LAB — PROCALCITONIN: Procalcitonin: 1.66 ng/mL

## 2023-07-30 LAB — PHOSPHORUS: Phosphorus: 3.1 mg/dL (ref 2.5–4.6)

## 2023-07-30 LAB — C-REACTIVE PROTEIN: CRP: 13 mg/dL — ABNORMAL HIGH (ref <1.0)

## 2023-07-30 LAB — BRAIN NATRIURETIC PEPTIDE: B Natriuretic Peptide: 228.8 pg/mL — ABNORMAL HIGH (ref 0.0–100.0)

## 2023-07-30 MED ORDER — INSULIN PEN NEEDLE 31G X 8 MM MISC
1.0000 | Freq: Three times a day (TID) | 0 refills | Status: AC
Start: 2023-07-30 — End: ?
  Filled 2023-07-30: qty 100, 33d supply, fill #0

## 2023-07-30 MED ORDER — IPRATROPIUM-ALBUTEROL 0.5-2.5 (3) MG/3ML IN SOLN
RESPIRATORY_TRACT | 0 refills | Status: AC
  Filled 2023-07-30: qty 360, 15d supply, fill #0

## 2023-07-30 MED ORDER — ISOSORBIDE MONONITRATE ER 30 MG PO TB24
30.0000 mg | ORAL_TABLET | Freq: Every day | ORAL | Status: DC
  Administered 2023-07-30: 30 mg via ORAL
  Filled 2023-07-30: qty 1

## 2023-07-30 MED ORDER — ASPIRIN EC 81 MG PO TBEC: 81.0000 mg | DELAYED_RELEASE_TABLET | Freq: Every day | ORAL | Status: AC

## 2023-07-30 MED ORDER — AMOXICILLIN-POT CLAVULANATE 875-125 MG PO TABS
1.0000 | ORAL_TABLET | Freq: Two times a day (BID) | ORAL | 0 refills | Status: AC
  Filled 2023-07-30: qty 8, 4d supply, fill #0

## 2023-07-30 MED ORDER — INSULIN LISPRO (1 UNIT DIAL) 100 UNIT/ML (KWIKPEN)
PEN_INJECTOR | SUBCUTANEOUS | 0 refills | Status: AC
  Filled 2023-07-30: qty 15, 30d supply, fill #0

## 2023-07-30 MED ORDER — TAMSULOSIN HCL 0.4 MG PO CAPS
0.4000 mg | ORAL_CAPSULE | Freq: Every day | ORAL | 0 refills | Status: AC
  Filled 2023-07-30: qty 30, 30d supply, fill #0

## 2023-07-30 NOTE — Plan of Care (Signed)
  Problem: Education: Goal: Ability to describe self-care measures that may prevent or decrease complications (Diabetes Survival Skills Education) will improve Outcome: Adequate for Discharge Goal: Individualized Educational Video(s) Outcome: Adequate for Discharge   Problem: Coping: Goal: Ability to adjust to condition or change in health will improve Outcome: Adequate for Discharge   Problem: Fluid Volume: Goal: Ability to maintain a balanced intake and output will improve Outcome: Adequate for Discharge   Problem: Health Behavior/Discharge Planning: Goal: Ability to identify and utilize available resources and services will improve Outcome: Adequate for Discharge Goal: Ability to manage health-related needs will improve Outcome: Adequate for Discharge   Problem: Metabolic: Goal: Ability to maintain appropriate glucose levels will improve Outcome: Adequate for Discharge   Problem: Nutritional: Goal: Maintenance of adequate nutrition will improve Outcome: Adequate for Discharge Goal: Progress toward achieving an optimal weight will improve Outcome: Adequate for Discharge   Problem: Skin Integrity: Goal: Risk for impaired skin integrity will decrease Outcome: Adequate for Discharge   Problem: Tissue Perfusion: Goal: Adequacy of tissue perfusion will improve Outcome: Adequate for Discharge   Problem: Activity: Goal: Ability to tolerate increased activity will improve Outcome: Adequate for Discharge   Problem: Clinical Measurements: Goal: Ability to maintain a body temperature in the normal range will improve Outcome: Adequate for Discharge   Problem: Respiratory: Goal: Ability to maintain adequate ventilation will improve Outcome: Adequate for Discharge Goal: Ability to maintain a clear airway will improve Outcome: Adequate for Discharge   Problem: Education: Goal: Knowledge of General Education information will improve Description: Including pain rating scale,  medication(s)/side effects and non-pharmacologic comfort measures Outcome: Adequate for Discharge   Problem: Health Behavior/Discharge Planning: Goal: Ability to manage health-related needs will improve Outcome: Adequate for Discharge   Problem: Clinical Measurements: Goal: Ability to maintain clinical measurements within normal limits will improve Outcome: Adequate for Discharge Goal: Will remain free from infection Outcome: Adequate for Discharge Goal: Diagnostic test results will improve Outcome: Adequate for Discharge Goal: Respiratory complications will improve Outcome: Adequate for Discharge Goal: Cardiovascular complication will be avoided Outcome: Adequate for Discharge   Problem: Activity: Goal: Risk for activity intolerance will decrease Outcome: Adequate for Discharge   Problem: Nutrition: Goal: Adequate nutrition will be maintained Outcome: Adequate for Discharge   Problem: Coping: Goal: Level of anxiety will decrease Outcome: Adequate for Discharge   Problem: Elimination: Goal: Will not experience complications related to bowel motility Outcome: Adequate for Discharge Goal: Will not experience complications related to urinary retention Outcome: Adequate for Discharge   Problem: Pain Management: Goal: General experience of comfort will improve Outcome: Adequate for Discharge   Problem: Safety: Goal: Ability to remain free from injury will improve Outcome: Adequate for Discharge   Problem: Skin Integrity: Goal: Risk for impaired skin integrity will decrease Outcome: Adequate for Discharge

## 2023-07-30 NOTE — Plan of Care (Signed)
  Problem: Education: Goal: Ability to describe self-care measures that may prevent or decrease complications (Diabetes Survival Skills Education) will improve Outcome: Progressing Goal: Individualized Educational Video(s) Outcome: Progressing   Problem: Coping: Goal: Ability to adjust to condition or change in health will improve Outcome: Progressing   Problem: Fluid Volume: Goal: Ability to maintain a balanced intake and output will improve Outcome: Progressing   Problem: Skin Integrity: Goal: Risk for impaired skin integrity will decrease Outcome: Progressing

## 2023-07-30 NOTE — Care Management Important Message (Signed)
Important Message  Patient Details  Name: Jackson [name] MRN: [id number] Date of Birth: [birth year]   Important Message Given:  Yes - Medicare IM  Patient left prior to IM delivery will mail a copy to the patient home address.    Zelia Yzaguirre 07/30/2023, 1:29 PM

## 2023-07-30 NOTE — Discharge Summary (Signed)
Jackson [name]:[id number] DOB: 1929-03-24 DOA: 07/27/2023  PCP: Emilio Aspen, MD  Admit date: 07/27/2023  Discharge date: 07/30/2023  Admitted From: Home   Disposition:  Home   Recommendations for Outpatient Follow-up:   Follow up with PCP in 1-2 weeks  PCP Please obtain BMP/CBC, 2 view CXR in 1week,  (see Discharge instructions)   PCP Please follow up on the following pending results:    Home Health: PT, SLP, OT   Equipment/Devices: walker, has home o2,  Consultations: None  Discharge Condition: Stable    CODE STATUS: Full    Diet Recommendation: Dysphagia 3 diet, check CBGs q. [facility]    Chief Complaint  Patient presents with   Hematemesis     Brief history of present illness from the day of admission and additional interim summary     89 y.o. male with medical history significant of  DM, HTN, HLD, GERD, and chronic diastolic CHF presenting with hematemesis and concern for aspiration.  Overnight son noted his having increased coughing which eventually precipitated what was reported as large-volume hematemesis.  Son reports that with patient's history of acid reflux hematemesis is not abnormal after "coughing spells "and occurs every few months.  After he vomited he later noted shortness of breath and chills and son decided to call EMS.  On EMS arrival patient was noted to be hypoxic on 4 L nasal cannula that he uses as needed at home.  Patient and son denies known sick contacts or recent URI symptoms.  He came to the hospital he was diagnosed with aspiration pneumonia and admitted to the hospital.                                                                  Hospital Course   #Sepsis due to aspiration pneumonia present on admission. Patient had history of GERD causing coughing spells with  nausea vomiting and at times some hematemesis with aspiration pneumonia in the past, discussed with son, currently nausea vomiting better, no GI complaints, he was seen by speech placed on dysphagia 3 diet, placed on empiric IV antibiotics and gentle hydration with IV fluids.  Sepsis pathophysiology has completely resolved, cultures negative, he is now symptom-free, will be switched on 4 more days of oral Augmentin, he already has as needed oxygen which she will use.  Handheld inhaler provided.  Home PT and speech also provided upon discharge.   #Acute Hypoxic Respiratory Failure secondary to Pneumonia - Treat as above, oxygen as needed uses as needed oxygen at home.  Close to his baseline now.   #Hematemesis #GERD - IV Protonix >> Po PPI -Previous history of same with coughing spells, question if he develops a Mallory-Weiss tear with nausea vomiting, type screen, monitor CBC, no signs of ongoing bleed, outpatient  GI follow-up and workup as desired.   #Hypertension -Heart Norvasc and monitor.  Resting bradycardia, PCP to monitor.   #Hyperlipidemia -Continue home Crestor when cleared for PO  #Type II Diabetes Mellitus #Diabetic Neuropathy Resume home regimen added sliding scale as needed upon discharge.    Discharge diagnosis     Principal Problem:   Multifocal pneumonia Active Problems:   Acute hypoxic respiratory failure (HCC)   Type 2 diabetes mellitus (HCC)   Essential hypertension   Dyslipidemia   Severe sepsis (HCC)   Sepsis (HCC)   Hematemesis   GERD (gastroesophageal reflux disease)    Discharge instructions    Discharge Instructions     Discharge instructions   Complete by: As directed    Follow with Primary MD Emilio Aspen, MD in 7 days   Get CBC, CMP, 2 view Chest X ray -  checked next visit with your primary MD   Activity: As tolerated with Full fall precautions use walker/cane & assistance as needed  Disposition Home    Diet: Dysphagia 3 diet  with feeding assistance and aspiration precautions.  Check CBGs q. [facility].  Special Instructions: If you have smoked or chewed Tobacco  in the last 2 yrs please stop smoking, stop any regular Alcohol  and or any Recreational drug use.  On your next visit with your primary care physician please Get Medicines reviewed and adjusted.  Please request your Prim.MD to go over all Hospital Tests and Procedure/Radiological results at the follow up, please get all Hospital records sent to your Prim MD by signing hospital release before you go home.  If you experience worsening of your admission symptoms, develop shortness of breath, life threatening emergency, suicidal or homicidal thoughts you must seek medical attention immediately by calling 911 or calling your MD immediately  if symptoms less severe.  You Must read complete instructions/literature along with all the possible adverse reactions/side effects for all the Medicines you take and that have been prescribed to you. Take any new Medicines after you have completely understood and accpet all the possible adverse reactions/side effects.   Do not drive when taking Pain medications.  Do not take more than prescribed Pain, Sleep and Anxiety Medications   Increase activity slowly   Complete by: As directed        Discharge Medications   Allergies as of 07/30/2023       Reactions   Actos [pioglitazone] Swelling   Formaldehyde Other (See Comments)   Flu-like symptoms   Lipitor [atorvastatin Calcium] Other (See Comments)   myalgia   Metformin And Related Diarrhea   unknown        Medication List     STOP taking these medications    amoxicillin 500 MG capsule Commonly known as: AMOXIL   HumaLOG KwikPen 200 UNIT/ML KwikPen Generic drug: insulin lispro Replaced by: HumaLOG 100 UNIT/ML cartridge   insulin aspart 100 UNIT/ML FlexPen Commonly known as: NovoLOG FlexPen       TAKE these medications    acetaminophen 500 MG  tablet Commonly known as: TYLENOL Take 1,000 mg by mouth every 8 (eight) hours as needed for mild pain (pain score 1-3) or headache.   amoxicillin-clavulanate 875-125 MG tablet Commonly known as: AUGMENTIN Take 1 tablet by mouth 2 (two) times daily.   aspirin EC 81 MG tablet Take 1 tablet (81 mg total) by mouth daily. Start taking on: August 01, 2023 What changed: These instructions start on August 01, 2023. If you are unsure  what to do until then, ask your doctor or other care provider.   benzocaine 10 % mucosal gel Commonly known as: ORAJEL Use as directed 1 Application in the mouth or throat as needed for mouth pain.   CALCIUM 600+D PO Take 1 tablet by mouth daily.   carboxymethylcellulose 0.5 % Soln Commonly known as: REFRESH PLUS Place 1 drop into both eyes as needed (dry eyes).   famotidine 20 MG tablet Commonly known as: PEPCID Take 20 mg by mouth at bedtime as needed for heartburn or indigestion.   fluticasone 50 MCG/ACT nasal spray Commonly known as: FLONASE Place 2 sprays into both nostrils 2 (two) times daily.   gabapentin 100 MG capsule Commonly known as: NEURONTIN Take 300 mg by mouth 3 (three) times daily.   HumaLOG 100 UNIT/ML cartridge Generic drug: insulin lispro Before each meal 3 times a day, 140-199 - 2 units, 200-250 - 4 units, 251-299 - 6 units,  300-349 - 8 units,  350 or above 10 units. Insulin PEN if approved, provide syringes and needles if needed.Please switch to any approved short acting Insulin if needed. Replaces: HumaLOG KwikPen 200 UNIT/ML KwikPen   insulin glargine 100 UNIT/ML Solostar Pen Commonly known as: LANTUS Inject 10 Units into the skin at bedtime.   ipratropium-albuterol 0.5-2.5 (3) MG/3ML Soln Commonly known as: DUONEB Use twice a day scheduled and every 4 hours as needed for shortness of breath and wheezing   lisinopril 5 MG tablet Commonly known as: ZESTRIL Take 1 tablet (5 mg total) by mouth daily. What changed:  how much to take   omeprazole 20 MG capsule Commonly known as: PRILOSEC Take 20 mg by mouth daily.   rosuvastatin 10 MG tablet Commonly known as: CRESTOR TAKE 1 TABLET AT BEDTIME   tamsulosin 0.4 MG Caps capsule Commonly known as: FLOMAX Take 1 capsule (0.4 mg total) by mouth daily after supper.   tetrahydrozoline-zinc 0.05-0.25 % ophthalmic solution Commonly known as: VISINE-AC Place 2 drops into both eyes 3 (three) times daily as needed (dry eyes).   Trulicity 0.75 MG/0.5ML Soaj Generic drug: Dulaglutide Inject 0.75 mg into the skin once a week. Inject on Monday   Zinc 30 MG Tabs Take 30 mg by mouth daily.               Durable Medical Equipment  (From admission, onward)           Start     Ordered   07/29/23 1020  For home use only DME Walker rolling  Once       Comments: 5 wheel  Question Answer Comment  Walker: With 5 Inch Wheels   Patient needs a walker to treat with the following condition Weakness      07/29/23 1019             Follow-up Information     [facility] Follow up.   Why: for home health services. they will reach out to [facility] to schedule Contact information: [address] [place] [address] [phone]         Emilio Aspen, MD. Schedule an appointment as soon as possible for a visit in 1 week(s).   Specialty: Internal Medicine Contact information: [address] [place] [address] [phone]                 Major procedures and Radiology Reports - PLEASE review detailed and final reports thoroughly  -  DG Chest Port 1 View  Result Date: 07/28/2023 CLINICAL DATA:  Shortness of breath. EXAM: PORTABLE CHEST 1 VIEW COMPARISON:  07/27/2023 FINDINGS: Patchy asymmetric left greater than right airspace disease seen on yesterday's exam is similar today. Right pleural effusion is similar. The cardio pericardial silhouette is enlarged. No acute bony  abnormality. Telemetry leads overlie the chest. IMPRESSION: 1. No significant interval change. 2. Patchy asymmetric left greater than right airspace disease and right pleural effusion. Electronically Signed   By: Kennith Center M.D.   On: 07/28/2023 06:50   DG Chest Port 1 View  Result Date: 07/27/2023 CLINICAL DATA:  Sepsis. EXAM: PORTABLE CHEST 1 VIEW COMPARISON:  03/12/2022 FINDINGS: Diffuse airspace disease in the left lung associated with collapse/consolidative disease in the right base and small right pleural effusion. The cardio pericardial silhouette is enlarged. No acute bony abnormality. Telemetry leads overlie the chest. IMPRESSION: Diffuse airspace disease in the left lung with collapse/consolidative disease in the right base and small right pleural effusion. Imaging features compatible with multifocal pneumonia. Electronically Signed   By: Kennith Center M.D.   On: 07/27/2023 05:36    Micro Results    Recent Results (from the past 240 hour(s))  Blood Culture (routine x 2)     Status: None (Preliminary result)   Collection Time: 07/27/23  5:16 AM   Specimen: BLOOD  Result Value Ref Range Status   Specimen Description BLOOD BLOOD LEFT ARM  Final   Special Requests   Final    BOTTLES DRAWN AEROBIC AND ANAEROBIC Blood Culture adequate volume   Culture   Final    NO GROWTH 3 DAYS Performed at [facility], [address] [place], [place] [address]    Report Status PENDING  Incomplete  Blood Culture (routine x 2)     Status: None (Preliminary result)   Collection Time: 07/27/23  5:21 AM   Specimen: BLOOD  Result Value Ref Range Status   Specimen Description BLOOD BLOOD LEFT HAND  Final   Special Requests   Final    BOTTLES DRAWN AEROBIC AND ANAEROBIC Blood Culture adequate volume   Culture   Final    NO GROWTH 3 DAYS Performed at [facility], [address] [place], [place] [address]    Report Status PENDING  Incomplete  Resp panel by RT-PCR (RSV, Flu A&B, Covid)  Anterior Nasal Swab     Status: None   Collection Time: 07/27/23  5:34 AM   Specimen: Anterior Nasal Swab  Result Value Ref Range Status   SARS Coronavirus 2 by RT PCR NEGATIVE NEGATIVE Final   Influenza A by PCR NEGATIVE NEGATIVE Final   Influenza B by PCR NEGATIVE NEGATIVE Final    Comment: (NOTE) The Xpert Xpress SARS-CoV-2/FLU/RSV plus assay is intended as an aid in the diagnosis of influenza from Nasopharyngeal swab specimens and should not be used as a sole basis for treatment. Nasal washings and aspirates are unacceptable for Xpert Xpress SARS-CoV-2/FLU/RSV testing.  Fact Sheet for Patients: BloggerCourse.com  Fact Sheet for Healthcare Providers: SeriousBroker.it  This test is not yet approved or cleared by the [place] FDA and has been authorized for detection and/or diagnosis of SARS-CoV-2 by FDA under an Emergency Use Authorization (EUA). This EUA will remain in effect (meaning this test can be used) for the duration of the COVID-19 declaration under Section 564(b)(1) of the Act, 21 U.S.C. section 360bbb-3(b)(1), unless the authorization is terminated or revoked.     Resp Syncytial Virus  by PCR NEGATIVE NEGATIVE Final    Comment: (NOTE) Fact Sheet for Patients: BloggerCourse.com  Fact Sheet for Healthcare Providers: SeriousBroker.it  This test is not yet approved or cleared by the [place] FDA and has been authorized for detection and/or diagnosis of SARS-CoV-2 by FDA under an Emergency Use Authorization (EUA). This EUA will remain in effect (meaning this test can be used) for the duration of the COVID-19 declaration under Section 564(b)(1) of the Act, 21 U.S.C. section 360bbb-3(b)(1), unless the authorization is terminated or revoked.  Performed at [facility], [address], [place], [place] [address]     Today   Subjective    Romuald Sha today has no headache,no chest abdominal pain,no new weakness tingling or numbness, feels much better wants to go home today.     Objective   Blood pressure (!) 150/63, pulse 55, temperature 98.4 F (36.9 C), temperature source Oral, resp. rate (!) 21, height 5\' 8"  (1.727 m), weight 81.6 kg, SpO2 95%.   Intake/Output Summary (Last 24 hours) at 07/30/2023 0858 Last data filed at 07/30/2023 0639 Gross per 24 hour  Intake 600 ml  Output 1700 ml  Net -1100 ml    Exam  Awake Alert, Ox 2, No new F.N deficits,    Lamy.AT,PERRAL Supple Neck,   Symmetrical Chest wall movement, Good air movement bilaterally, CTAB RRR,No Gallops,   +ve B.Sounds, Abd Soft, Non tender,  No Cyanosis, Clubbing or edema    Data Review   Recent Labs  Lab 07/27/23 0530 07/27/23 1128 07/27/23 2018 07/28/23 0359 07/29/23 0506 07/30/23 0407  WBC 11.6* 17.9* 14.9* 11.3* 9.6 8.1  HGB 13.9 13.3 12.4* 11.7* 11.4* 10.9*  HCT 41.5 40.8 37.7* 35.7* 35.3* 32.6*  PLT 277 258 255 234 217 212  MCV 93.3 92.7 93.3 92.7 91.9 91.8  MCH 31.2 30.2 30.7 30.4 29.7 30.7  MCHC 33.5 32.6 32.9 32.8 32.3 33.4  RDW 12.5 12.6 12.7 12.8 12.8 12.4  LYMPHSABS 0.7  --   --   --  0.9 1.2  MONOABS 0.6  --   --   --  1.0 1.1*  EOSABS 0.1  --   --   --  0.3 0.5  BASOSABS 0.0  --   --   --  0.0 0.0    Recent Labs  Lab 07/27/23 0513 07/27/23 0514 07/27/23 0530 07/27/23 0742 07/28/23 0359 07/28/23 0711 07/29/23 0506 07/30/23 0407  NA 136  --   --   --  137  --  134* 135  K 4.0  --   --   --  4.3  --  3.8 3.8  CL 98  --   --   --  100  --  98 99  CO2  --   --   --   --  31  --  27 28  ANIONGAP  --   --   --   --  6  --  9 8  GLUCOSE 177*  --   --   --  113*  --  167* 180*  BUN 20  --   --   --  18  --  16 16  CREATININE 1.10  --   --   --  1.26*  --  1.20 1.17  CRP  --   --   --   --   --  13.4* 14.7* 13.0*  PROCALCITON  --   --   --   --   --  4.18 2.56 1.66  LATICACIDVEN  --  2.1*  --  2.1*  --   --   --   --   INR   --   --  1.1  --   --   --   --   --   BNP  --   --   --   --   --  655.0* 319.0* 228.8*  MG  --   --   --   --   --   --  1.7 1.8  CALCIUM  --   --   --   --  9.1  --  9.1 9.1      Total Time in preparing paper work, data evaluation and todays exam - 35 minutes  Signature  -    Susa Raring M.D on 07/30/2023 at 8:58 AM   -  To page go to [email or web address]

## 2023-07-30 NOTE — TOC Transition Note (Signed)
Transition of Care [facility]) - CM/SW Discharge Note   Patient Details  Name: Jackson [name] MRN: [id number] Date of Birth: 12-Aug-1929  [facility] of [facility]) CM/SW Contact:  Lawerance Sabal, RN Phone Number: 07/30/2023, 1:32 PM   Clinical Narrative:     Notified HH agency of DC, no other TOC needs identified for DC    Barriers to Discharge: Continued Medical Work up   Patient Goals and CMS Choice CMS Medicare.gov Compare Post Acute Care list provided to:: Other (Comment Required) Choice offered to / list presented to : Adult Children  Discharge Placement                         Discharge Plan and Services Additional resources added to the After Visit Summary for     Discharge Planning Services: CM Consult            DME Arranged: N/A         HH Arranged: PT, OT HH Agency: [facility] Date [facility] Agency Contacted: 07/29/23 Time HH Agency Contacted: 1225 Representative spoke with at [facility] Agency: Amy  Social Determinants of Health (SDOH) Interventions SDOH Screenings   Food Insecurity: No Food Insecurity (07/29/2023)  Housing: Low Risk  (07/29/2023)  Transportation Needs: No Transportation Needs (07/29/2023)  Utilities: Not At Risk (07/29/2023)  Tobacco Use: Medium Risk (07/29/2023)     Readmission Risk Interventions     No data to display

## 2023-07-30 NOTE — Discharge Instructions (Addendum)
Follow with Primary MD Emilio Aspen, MD in 7 days   Get CBC, CMP, 2 view Chest X ray -  checked next visit with your primary MD   Activity: As tolerated with Full fall precautions use walker/cane & assistance as needed  Disposition Home    Diet: Dysphagia 3 diet with feeding assistance and aspiration precautions.  Check CBGs q. [facility] S because  Special Instructions: If you have smoked or chewed Tobacco  in the last 2 yrs please stop smoking, stop any regular Alcohol  and or any Recreational drug use.  On your next visit with your primary care physician please Get Medicines reviewed and adjusted.  Please request your Prim.MD to go over all Hospital Tests and Procedure/Radiological results at the follow up, please get all Hospital records sent to your Prim MD by signing hospital release before you go home.  If you experience worsening of your admission symptoms, develop shortness of breath, life threatening emergency, suicidal or homicidal thoughts you must seek medical attention immediately by calling 911 or calling your MD immediately  if symptoms less severe.  You Must read complete instructions/literature along with all the possible adverse reactions/side effects for all the Medicines you take and that have been prescribed to you. Take any new Medicines after you have completely understood and accpet all the possible adverse reactions/side effects.   Do not drive when taking Pain medications.  Do not take more than prescribed Pain, Sleep and Anxiety Medications

## 2023-07-30 NOTE — Progress Notes (Signed)
Physical Therapy Treatment Patient Details Name: Jackson [name] MRN: [id number] DOB: [birth year] Today's Date: 07/30/2023   History of Present Illness 89 y.o. male who comes in 07/27/23 with most likely aspiration PNA after episode of vomiting. Sepsis;  Past Medical History of DM, diabetic neuropathy, HTN, HLD and GERd    PT Comments  Pt received sitting in the recliner brushing his teeth and agreeable to session. Pt able to stand and perform gait trial with up to supervision for safety. Unable to obtain stable SpO2 reading during ambulation, however pt with some DOE on 3L. Distance limited by pt requesting to eat breakfast. Education provided on need for supplemental O2 at this time and importance of monitoring O2 sats at home. Pt continues to benefit from PT services to progress toward functional mobility goals.    If plan is discharge home, recommend the following: Assistance with cooking/housework;Assist for transportation   Can travel by private vehicle        Equipment Recommendations  None recommended by PT    Recommendations for Other Services       Precautions / Restrictions Precautions Precautions: Fall Restrictions Weight Bearing Restrictions: No     Mobility  Bed Mobility               General bed mobility comments: up in recliner    Transfers Overall transfer level: Needs assistance Equipment used: Rolling walker (2 wheels) Transfers: Sit to/from Stand Sit to Stand: Supervision           General transfer comment: increased time and supervision for safety    Ambulation/Gait Ambulation/Gait assistance: Supervision Gait Distance (Feet): 100 Feet Assistive device: Rolling walker (2 wheels) Gait Pattern/deviations: Step-through pattern, Trunk flexed Gait velocity: decr     General Gait Details: Cues for upright posture and RW proximity     Balance Overall balance assessment: Needs assistance Sitting-balance support: Feet supported Sitting  balance-Leahy Scale: Fair Sitting balance - Comments: sitting in recliner   Standing balance support: Bilateral upper extremity supported, During functional activity Standing balance-Leahy Scale: Fair Standing balance comment: with RW support                            Cognition Arousal: Alert Behavior During Therapy: WFL for tasks assessed/performed Overall Cognitive Status: Difficult to assess                                          Exercises      General Comments General comments (skin integrity, edema, etc.): Unable to obtain stable SpO2 pleth during ambulation with pt on 3L. SpO2 >92% on 2.5L sitting in recliner      Pertinent Vitals/Pain Pain Assessment Pain Assessment: No/denies pain     PT Goals (current goals can now be found in the care plan section) Acute Rehab PT Goals Patient Stated Goal: return to his home with son next door PT Goal Formulation: With patient Time For Goal Achievement: 08/11/23 Progress towards PT goals: Progressing toward goals    Frequency    Min 1X/week       AM-PAC PT "6 Clicks" Mobility   Outcome Measure  Help needed turning from your back to your side while in a flat bed without using bedrails?: A Little Help needed moving from lying on your back to sitting on the side of a flat bed without  using bedrails?: A Little Help needed moving to and from a bed to a chair (including a wheelchair)?: A Little Help needed standing up from a chair using your arms (e.g., wheelchair or bedside chair)?: A Little Help needed to walk in hospital room?: A Little Help needed climbing 3-5 steps with a railing? : A Little 6 Click Score: 18    End of Session Equipment Utilized During Treatment: Gait belt;Oxygen Activity Tolerance: Patient tolerated treatment well Patient left: in chair;with call bell/phone within reach;with chair alarm set Nurse Communication: Mobility status PT Visit Diagnosis: History of falling  (Z91.81);Muscle weakness (generalized) (M62.81)     Time: 5284-1324 PT Time Calculation (min) (ACUTE ONLY): 26 min  Charges:    $Gait Training: 8-22 mins $Therapeutic Activity: 8-22 mins PT General Charges $$ ACUTE PT VISIT: 1 Visit                    Johny Shock, PTA Acute Rehabilitation Services Secure Chat Preferred  [phone]    Johny Shock 07/30/2023, 10:33 AM

## 2023-07-31 DIAGNOSIS — Z9981 Dependence on supplemental oxygen: Secondary | ICD-10-CM | POA: Diagnosis not present

## 2023-07-31 DIAGNOSIS — K920 Hematemesis: Secondary | ICD-10-CM | POA: Diagnosis not present

## 2023-07-31 DIAGNOSIS — E1140 Type 2 diabetes mellitus with diabetic neuropathy, unspecified: Secondary | ICD-10-CM | POA: Diagnosis not present

## 2023-07-31 DIAGNOSIS — I110 Hypertensive heart disease with heart failure: Secondary | ICD-10-CM | POA: Diagnosis not present

## 2023-07-31 DIAGNOSIS — Z794 Long term (current) use of insulin: Secondary | ICD-10-CM | POA: Diagnosis not present

## 2023-07-31 DIAGNOSIS — I5032 Chronic diastolic (congestive) heart failure: Secondary | ICD-10-CM | POA: Diagnosis not present

## 2023-07-31 DIAGNOSIS — J168 Pneumonia due to other specified infectious organisms: Secondary | ICD-10-CM | POA: Diagnosis not present

## 2023-07-31 DIAGNOSIS — J9601 Acute respiratory failure with hypoxia: Secondary | ICD-10-CM | POA: Diagnosis not present

## 2023-07-31 DIAGNOSIS — Z51A Encounter for sepsis aftercare: Secondary | ICD-10-CM | POA: Diagnosis not present

## 2023-07-31 DIAGNOSIS — Z7985 Long-term (current) use of injectable non-insulin antidiabetic drugs: Secondary | ICD-10-CM | POA: Diagnosis not present

## 2023-08-01 LAB — CULTURE, BLOOD (ROUTINE X 2)
Culture: NO GROWTH
Culture: NO GROWTH
Special Requests: ADEQUATE
Special Requests: ADEQUATE

## 2023-08-05 ENCOUNTER — Other Ambulatory Visit: Payer: Self-pay | Admitting: Internal Medicine

## 2023-08-05 ENCOUNTER — Ambulatory Visit
Admission: RE | Admit: 2023-08-05 | Discharge: 2023-08-05 | Disposition: A | Discharge status: Home / Self Care | Payer: Medicare (Managed Care) | Source: Ambulatory Visit | Attending: Internal Medicine | Admitting: Internal Medicine

## 2023-08-05 DIAGNOSIS — Z794 Long term (current) use of insulin: Secondary | ICD-10-CM | POA: Diagnosis not present

## 2023-08-05 DIAGNOSIS — R918 Other nonspecific abnormal finding of lung field: Secondary | ICD-10-CM | POA: Diagnosis not present

## 2023-08-05 DIAGNOSIS — E113412 Type 2 diabetes mellitus with severe nonproliferative diabetic retinopathy with macular edema, left eye: Secondary | ICD-10-CM | POA: Diagnosis not present

## 2023-08-05 DIAGNOSIS — J690 Pneumonitis due to inhalation of food and vomit: Secondary | ICD-10-CM | POA: Diagnosis not present

## 2023-08-05 DIAGNOSIS — J90 Pleural effusion, not elsewhere classified: Secondary | ICD-10-CM | POA: Diagnosis not present

## 2023-08-12 ENCOUNTER — Other Ambulatory Visit (HOSPITAL_COMMUNITY): Payer: Self-pay

## 2023-08-23 DIAGNOSIS — J168 Pneumonia due to other specified infectious organisms: Secondary | ICD-10-CM | POA: Diagnosis not present

## 2023-08-23 DIAGNOSIS — Z9981 Dependence on supplemental oxygen: Secondary | ICD-10-CM | POA: Diagnosis not present

## 2023-08-23 DIAGNOSIS — E1140 Type 2 diabetes mellitus with diabetic neuropathy, unspecified: Secondary | ICD-10-CM | POA: Diagnosis not present

## 2023-08-23 DIAGNOSIS — J9601 Acute respiratory failure with hypoxia: Secondary | ICD-10-CM | POA: Diagnosis not present

## 2023-08-23 DIAGNOSIS — I110 Hypertensive heart disease with heart failure: Secondary | ICD-10-CM | POA: Diagnosis not present

## 2023-08-23 DIAGNOSIS — Z794 Long term (current) use of insulin: Secondary | ICD-10-CM | POA: Diagnosis not present

## 2023-08-23 DIAGNOSIS — Z7985 Long-term (current) use of injectable non-insulin antidiabetic drugs: Secondary | ICD-10-CM | POA: Diagnosis not present

## 2023-08-23 DIAGNOSIS — Z51A Encounter for sepsis aftercare: Secondary | ICD-10-CM | POA: Diagnosis not present

## 2023-08-23 DIAGNOSIS — I5032 Chronic diastolic (congestive) heart failure: Secondary | ICD-10-CM | POA: Diagnosis not present

## 2023-08-23 DIAGNOSIS — K920 Hematemesis: Secondary | ICD-10-CM | POA: Diagnosis not present

## 2023-09-11 DIAGNOSIS — J449 Chronic obstructive pulmonary disease, unspecified: Secondary | ICD-10-CM | POA: Diagnosis not present

## 2023-09-11 DIAGNOSIS — J189 Pneumonia, unspecified organism: Secondary | ICD-10-CM | POA: Diagnosis not present

## 2023-09-24 ENCOUNTER — Ambulatory Visit: Payer: Medicare (Managed Care) | Admitting: Podiatry

## 2023-09-24 DIAGNOSIS — K920 Hematemesis: Secondary | ICD-10-CM | POA: Diagnosis not present

## 2023-09-24 DIAGNOSIS — E1140 Type 2 diabetes mellitus with diabetic neuropathy, unspecified: Secondary | ICD-10-CM | POA: Diagnosis not present

## 2023-09-24 DIAGNOSIS — Z51A Encounter for sepsis aftercare: Secondary | ICD-10-CM | POA: Diagnosis not present

## 2023-09-24 DIAGNOSIS — I110 Hypertensive heart disease with heart failure: Secondary | ICD-10-CM | POA: Diagnosis not present

## 2023-09-24 DIAGNOSIS — Z9981 Dependence on supplemental oxygen: Secondary | ICD-10-CM | POA: Diagnosis not present

## 2023-09-24 DIAGNOSIS — Z794 Long term (current) use of insulin: Secondary | ICD-10-CM | POA: Diagnosis not present

## 2023-09-24 DIAGNOSIS — J9601 Acute respiratory failure with hypoxia: Secondary | ICD-10-CM | POA: Diagnosis not present

## 2023-09-24 DIAGNOSIS — Z7985 Long-term (current) use of injectable non-insulin antidiabetic drugs: Secondary | ICD-10-CM | POA: Diagnosis not present

## 2023-09-24 DIAGNOSIS — J168 Pneumonia due to other specified infectious organisms: Secondary | ICD-10-CM | POA: Diagnosis not present

## 2023-09-24 DIAGNOSIS — I5032 Chronic diastolic (congestive) heart failure: Secondary | ICD-10-CM | POA: Diagnosis not present

## 2023-10-01 ENCOUNTER — Encounter: Payer: Self-pay | Admitting: Podiatry

## 2023-10-01 ENCOUNTER — Ambulatory Visit: Payer: Medicare (Managed Care) | Admitting: Podiatry

## 2023-10-01 DIAGNOSIS — E119 Type 2 diabetes mellitus without complications: Secondary | ICD-10-CM | POA: Diagnosis not present

## 2023-10-01 DIAGNOSIS — B351 Tinea unguium: Secondary | ICD-10-CM | POA: Diagnosis not present

## 2023-10-01 DIAGNOSIS — M79676 Pain in unspecified toe(s): Secondary | ICD-10-CM

## 2023-10-01 DIAGNOSIS — Z794 Long term (current) use of insulin: Secondary | ICD-10-CM | POA: Diagnosis not present

## 2023-10-01 NOTE — Progress Notes (Signed)
This patient returns to my office for at risk foot care.  This patient requires this care by a professional since this patient will be at risk due to having Type 2 diabetes with neuropathy.  This patient is unable to cut nails himself since the patient cannot reach his nails.These nails are painful walking and wearing shoes.  This patient presents for at risk foot care today.  General Appearance  Alert, conversant and in no acute stress.  Vascular  Dorsalis pedis and posterior tibial  pulses are weakly  palpable  bilaterally.  Capillary return is within normal limits  bilaterally. Temperature is within normal limits  bilaterally.  Neurologic  Senn-Weinstein monofilament wire test within normal limits  bilaterally. Muscle power within normal limits bilaterally.  Nails Thick disfigured discolored nails with subungual debris  from hallux to fifth toes bilaterally. No evidence of bacterial infection or drainage bilaterally.  Orthopedic  No limitations of motion  feet .  No crepitus or effusions noted.  No bony pathology or digital deformities noted.  Skin  normotropic skin with no porokeratosis noted bilaterally.  No signs of infections or ulcers noted.     Onychomycosis  Pain in right toes  Pain in left toes  Consent was obtained for treatment procedures.   Mechanical debridement of nails 1-5  bilaterally performed with a nail nipper.  Filed with dremel without incident.    Return office visit    9 weeks                  Told patient to return for periodic foot care and evaluation due to potential at risk complications.   Helane Gunther DPM

## 2023-10-08 ENCOUNTER — Other Ambulatory Visit (HOSPITAL_COMMUNITY): Payer: Self-pay

## 2023-12-03 ENCOUNTER — Encounter: Payer: Self-pay | Admitting: Podiatry

## 2023-12-03 ENCOUNTER — Ambulatory Visit: Payer: Medicare (Managed Care) | Admitting: Podiatry

## 2023-12-03 DIAGNOSIS — Z794 Long term (current) use of insulin: Secondary | ICD-10-CM | POA: Diagnosis not present

## 2023-12-03 DIAGNOSIS — M79676 Pain in unspecified toe(s): Secondary | ICD-10-CM

## 2023-12-03 DIAGNOSIS — E119 Type 2 diabetes mellitus without complications: Secondary | ICD-10-CM | POA: Diagnosis not present

## 2023-12-03 DIAGNOSIS — B351 Tinea unguium: Secondary | ICD-10-CM

## 2023-12-03 NOTE — Progress Notes (Signed)
This patient returns to my office for at risk foot care.  This patient requires this care by a professional since this patient will be at risk due to having Type 2 diabetes with neuropathy.  This patient is unable to cut nails himself since the patient cannot reach his nails.These nails are painful walking and wearing shoes.  This patient presents for at risk foot care today.  General Appearance  Alert, conversant and in no acute stress.  Vascular  Dorsalis pedis and posterior tibial  pulses are weakly  palpable  bilaterally.  Capillary return is within normal limits  bilaterally. Temperature is within normal limits  bilaterally.  Neurologic  Senn-Weinstein monofilament wire test within normal limits  bilaterally. Muscle power within normal limits bilaterally.  Nails Thick disfigured discolored nails with subungual debris  from hallux to fifth toes bilaterally. No evidence of bacterial infection or drainage bilaterally.  Orthopedic  No limitations of motion  feet .  No crepitus or effusions noted.  No bony pathology or digital deformities noted.  Skin  normotropic skin with no porokeratosis noted bilaterally.  No signs of infections or ulcers noted.     Onychomycosis  Pain in right toes  Pain in left toes  Consent was obtained for treatment procedures.   Mechanical debridement of nails 1-5  bilaterally performed with a nail nipper.  Filed with dremel without incident.    Return office visit    9 weeks                  Told patient to return for periodic foot care and evaluation due to potential at risk complications.   Helane Gunther DPM

## 2024-02-07 ENCOUNTER — Encounter: Payer: Self-pay | Admitting: Podiatry

## 2024-02-07 ENCOUNTER — Ambulatory Visit: Payer: Medicare (Managed Care) | Admitting: Podiatry

## 2024-02-07 DIAGNOSIS — Z794 Long term (current) use of insulin: Secondary | ICD-10-CM

## 2024-02-07 DIAGNOSIS — M79676 Pain in unspecified toe(s): Secondary | ICD-10-CM | POA: Diagnosis not present

## 2024-02-07 DIAGNOSIS — E119 Type 2 diabetes mellitus without complications: Secondary | ICD-10-CM

## 2024-02-07 DIAGNOSIS — B351 Tinea unguium: Secondary | ICD-10-CM | POA: Diagnosis not present

## 2024-02-07 NOTE — Progress Notes (Signed)
This patient returns to my office for at risk foot care.  This patient requires this care by a professional since this patient will be at risk due to having Type 2 diabetes with neuropathy.  This patient is unable to cut nails himself since the patient cannot reach his nails.These nails are painful walking and wearing shoes.  This patient presents for at risk foot care today.  General Appearance  Alert, conversant and in no acute stress.  Vascular  Dorsalis pedis and posterior tibial  pulses are weakly  palpable  bilaterally.  Capillary return is within normal limits  bilaterally. Temperature is within normal limits  bilaterally.  Neurologic  Senn-Weinstein monofilament wire test within normal limits  bilaterally. Muscle power within normal limits bilaterally.  Nails Thick disfigured discolored nails with subungual debris  from hallux to fifth toes bilaterally. No evidence of bacterial infection or drainage bilaterally.  Orthopedic  No limitations of motion  feet .  No crepitus or effusions noted.  No bony pathology or digital deformities noted.  Skin  normotropic skin with no porokeratosis noted bilaterally.  No signs of infections or ulcers noted.     Onychomycosis  Pain in right toes  Pain in left toes  Consent was obtained for treatment procedures.   Mechanical debridement of nails 1-5  bilaterally performed with a nail nipper.  Filed with dremel without incident.    Return office visit    9 weeks                  Told patient to return for periodic foot care and evaluation due to potential at risk complications.   Helane Gunther DPM

## 2024-03-22 DIAGNOSIS — J449 Chronic obstructive pulmonary disease, unspecified: Secondary | ICD-10-CM | POA: Diagnosis not present

## 2024-03-22 DIAGNOSIS — J189 Pneumonia, unspecified organism: Secondary | ICD-10-CM | POA: Diagnosis not present

## 2024-04-17 ENCOUNTER — Ambulatory Visit: Payer: Medicare (Managed Care) | Admitting: Podiatry

## 2024-04-17 ENCOUNTER — Encounter: Payer: Self-pay | Admitting: Podiatry

## 2024-04-17 DIAGNOSIS — E119 Type 2 diabetes mellitus without complications: Secondary | ICD-10-CM

## 2024-04-17 DIAGNOSIS — Z794 Long term (current) use of insulin: Secondary | ICD-10-CM | POA: Diagnosis not present

## 2024-04-17 DIAGNOSIS — R001 Bradycardia, unspecified: Secondary | ICD-10-CM | POA: Diagnosis not present

## 2024-04-17 DIAGNOSIS — N1831 Chronic kidney disease, stage 3a: Secondary | ICD-10-CM | POA: Diagnosis not present

## 2024-04-17 DIAGNOSIS — R634 Abnormal weight loss: Secondary | ICD-10-CM | POA: Diagnosis not present

## 2024-04-17 DIAGNOSIS — J90 Pleural effusion, not elsewhere classified: Secondary | ICD-10-CM | POA: Diagnosis not present

## 2024-04-17 DIAGNOSIS — I10 Essential (primary) hypertension: Secondary | ICD-10-CM | POA: Diagnosis not present

## 2024-04-17 DIAGNOSIS — E113412 Type 2 diabetes mellitus with severe nonproliferative diabetic retinopathy with macular edema, left eye: Secondary | ICD-10-CM | POA: Diagnosis not present

## 2024-04-17 DIAGNOSIS — I350 Nonrheumatic aortic (valve) stenosis: Secondary | ICD-10-CM | POA: Diagnosis not present

## 2024-04-17 DIAGNOSIS — M79676 Pain in unspecified toe(s): Secondary | ICD-10-CM | POA: Diagnosis not present

## 2024-04-17 DIAGNOSIS — B351 Tinea unguium: Secondary | ICD-10-CM

## 2024-04-17 DIAGNOSIS — I5189 Other ill-defined heart diseases: Secondary | ICD-10-CM | POA: Diagnosis not present

## 2024-04-17 DIAGNOSIS — E1165 Type 2 diabetes mellitus with hyperglycemia: Secondary | ICD-10-CM | POA: Diagnosis not present

## 2024-04-17 DIAGNOSIS — R600 Localized edema: Secondary | ICD-10-CM | POA: Diagnosis not present

## 2024-04-17 NOTE — Progress Notes (Signed)
This patient returns to my office for at risk foot care.  This patient requires this care by a professional since this patient will be at risk due to having Type 2 diabetes with neuropathy.  This patient is unable to cut nails himself since the patient cannot reach his nails.These nails are painful walking and wearing shoes.  This patient presents for at risk foot care today.  General Appearance  Alert, conversant and in no acute stress.  Vascular  Dorsalis pedis and posterior tibial  pulses are weakly  palpable  bilaterally.  Capillary return is within normal limits  bilaterally. Temperature is within normal limits  bilaterally.  Neurologic  Senn-Weinstein monofilament wire test within normal limits  bilaterally. Muscle power within normal limits bilaterally.  Nails Thick disfigured discolored nails with subungual debris  from hallux to fifth toes bilaterally. No evidence of bacterial infection or drainage bilaterally.  Orthopedic  No limitations of motion  feet .  No crepitus or effusions noted.  No bony pathology or digital deformities noted.  Skin  normotropic skin with no porokeratosis noted bilaterally.  No signs of infections or ulcers noted.     Onychomycosis  Pain in right toes  Pain in left toes  Consent was obtained for treatment procedures.   Mechanical debridement of nails 1-5  bilaterally performed with a nail nipper.  Filed with dremel without incident.    Return office visit    9 weeks                  Told patient to return for periodic foot care and evaluation due to potential at risk complications.   Helane Gunther DPM

## 2024-04-20 ENCOUNTER — Other Ambulatory Visit (HOSPITAL_COMMUNITY): Payer: Self-pay | Admitting: Internal Medicine

## 2024-04-20 DIAGNOSIS — R634 Abnormal weight loss: Secondary | ICD-10-CM | POA: Diagnosis not present

## 2024-04-20 DIAGNOSIS — I350 Nonrheumatic aortic (valve) stenosis: Secondary | ICD-10-CM

## 2024-04-20 DIAGNOSIS — J90 Pleural effusion, not elsewhere classified: Secondary | ICD-10-CM | POA: Diagnosis not present

## 2024-04-22 DIAGNOSIS — J449 Chronic obstructive pulmonary disease, unspecified: Secondary | ICD-10-CM | POA: Diagnosis not present

## 2024-04-22 DIAGNOSIS — J189 Pneumonia, unspecified organism: Secondary | ICD-10-CM | POA: Diagnosis not present

## 2024-05-11 DIAGNOSIS — E113412 Type 2 diabetes mellitus with severe nonproliferative diabetic retinopathy with macular edema, left eye: Secondary | ICD-10-CM | POA: Diagnosis not present

## 2024-05-11 DIAGNOSIS — E1165 Type 2 diabetes mellitus with hyperglycemia: Secondary | ICD-10-CM | POA: Diagnosis not present

## 2024-05-11 DIAGNOSIS — I10 Essential (primary) hypertension: Secondary | ICD-10-CM | POA: Diagnosis not present

## 2024-05-11 DIAGNOSIS — R600 Localized edema: Secondary | ICD-10-CM | POA: Diagnosis not present

## 2024-05-11 DIAGNOSIS — I5189 Other ill-defined heart diseases: Secondary | ICD-10-CM | POA: Diagnosis not present

## 2024-05-11 DIAGNOSIS — Z794 Long term (current) use of insulin: Secondary | ICD-10-CM | POA: Diagnosis not present

## 2024-05-11 DIAGNOSIS — R001 Bradycardia, unspecified: Secondary | ICD-10-CM | POA: Diagnosis not present

## 2024-05-11 DIAGNOSIS — J90 Pleural effusion, not elsewhere classified: Secondary | ICD-10-CM | POA: Diagnosis not present

## 2024-05-11 DIAGNOSIS — N1831 Chronic kidney disease, stage 3a: Secondary | ICD-10-CM | POA: Diagnosis not present

## 2024-05-11 DIAGNOSIS — I350 Nonrheumatic aortic (valve) stenosis: Secondary | ICD-10-CM | POA: Diagnosis not present

## 2024-05-19 DIAGNOSIS — I509 Heart failure, unspecified: Secondary | ICD-10-CM | POA: Diagnosis not present

## 2024-05-23 DIAGNOSIS — J189 Pneumonia, unspecified organism: Secondary | ICD-10-CM | POA: Diagnosis not present

## 2024-05-23 DIAGNOSIS — J449 Chronic obstructive pulmonary disease, unspecified: Secondary | ICD-10-CM | POA: Diagnosis not present

## 2024-05-30 ENCOUNTER — Ambulatory Visit (HOSPITAL_COMMUNITY)
Admission: RE | Admit: 2024-05-30 | Discharge: 2024-05-30 | Disposition: A | Discharge status: Home / Self Care | Payer: Medicare (Managed Care) | Source: Ambulatory Visit | Attending: Internal Medicine | Admitting: Internal Medicine

## 2024-05-30 DIAGNOSIS — I350 Nonrheumatic aortic (valve) stenosis: Secondary | ICD-10-CM | POA: Diagnosis not present

## 2024-05-31 LAB — ECHOCARDIOGRAM COMPLETE
AR max vel: 1.77 cm2
AV Area VTI: 1.83 cm2
AV Area mean vel: 1.65 cm2
AV Mean grad: 8 mmHg
AV Peak grad: 16.3 mmHg
Ao pk vel: 2.02 m/s
Area-P 1/2: 1.92 cm2
S' Lateral: 2 cm

## 2024-06-19 ENCOUNTER — Ambulatory Visit: Payer: Medicare (Managed Care) | Admitting: Podiatry

## 2024-06-19 ENCOUNTER — Encounter: Payer: Self-pay | Admitting: Podiatry

## 2024-06-19 DIAGNOSIS — Z794 Long term (current) use of insulin: Secondary | ICD-10-CM

## 2024-06-19 DIAGNOSIS — M79676 Pain in unspecified toe(s): Secondary | ICD-10-CM | POA: Diagnosis not present

## 2024-06-19 DIAGNOSIS — B351 Tinea unguium: Secondary | ICD-10-CM

## 2024-06-19 DIAGNOSIS — E119 Type 2 diabetes mellitus without complications: Secondary | ICD-10-CM

## 2024-06-19 NOTE — Progress Notes (Signed)
This patient returns to my office for at risk foot care.  This patient requires this care by a professional since this patient will be at risk due to having Type 2 diabetes with neuropathy.  This patient is unable to cut nails himself since the patient cannot reach his nails.These nails are painful walking and wearing shoes.  This patient presents for at risk foot care today.  General Appearance  Alert, conversant and in no acute stress.  Vascular  Dorsalis pedis and posterior tibial  pulses are weakly  palpable  bilaterally.  Capillary return is within normal limits  bilaterally. Temperature is within normal limits  bilaterally.  Neurologic  Senn-Weinstein monofilament wire test within normal limits  bilaterally. Muscle power within normal limits bilaterally.  Nails Thick disfigured discolored nails with subungual debris  from hallux to fifth toes bilaterally. No evidence of bacterial infection or drainage bilaterally.  Orthopedic  No limitations of motion  feet .  No crepitus or effusions noted.  No bony pathology or digital deformities noted.  Skin  normotropic skin with no porokeratosis noted bilaterally.  No signs of infections or ulcers noted.     Onychomycosis  Pain in right toes  Pain in left toes  Consent was obtained for treatment procedures.   Mechanical debridement of nails 1-5  bilaterally performed with a nail nipper.  Filed with dremel without incident.    Return office visit    9 weeks                  Told patient to return for periodic foot care and evaluation due to potential at risk complications.   Helane Gunther DPM

## 2024-06-19 NOTE — H&P (View-Only) (Signed)
This patient returns to my office for at risk foot care.  This patient requires this care by a professional since this patient will be at risk due to having Type 2 diabetes with neuropathy.  This patient is unable to cut nails himself since the patient cannot reach his nails.These nails are painful walking and wearing shoes.  This patient presents for at risk foot care today.  General Appearance  Alert, conversant and in no acute stress.  Vascular  Dorsalis pedis and posterior tibial  pulses are weakly  palpable  bilaterally.  Capillary return is within normal limits  bilaterally. Temperature is within normal limits  bilaterally.  Neurologic  Senn-Weinstein monofilament wire test within normal limits  bilaterally. Muscle power within normal limits bilaterally.  Nails Thick disfigured discolored nails with subungual debris  from hallux to fifth toes bilaterally. No evidence of bacterial infection or drainage bilaterally.  Orthopedic  No limitations of motion  feet .  No crepitus or effusions noted.  No bony pathology or digital deformities noted.  Skin  normotropic skin with no porokeratosis noted bilaterally.  No signs of infections or ulcers noted.     Onychomycosis  Pain in right toes  Pain in left toes  Consent was obtained for treatment procedures.   Mechanical debridement of nails 1-5  bilaterally performed with a nail nipper.  Filed with dremel without incident.    Return office visit    9 weeks                  Told patient to return for periodic foot care and evaluation due to potential at risk complications.   Helane Gunther DPM

## 2024-06-21 DIAGNOSIS — Z23 Encounter for immunization: Secondary | ICD-10-CM | POA: Diagnosis not present

## 2024-06-21 DIAGNOSIS — I10 Essential (primary) hypertension: Secondary | ICD-10-CM | POA: Diagnosis not present

## 2024-06-22 DIAGNOSIS — J449 Chronic obstructive pulmonary disease, unspecified: Secondary | ICD-10-CM | POA: Diagnosis not present

## 2024-06-22 DIAGNOSIS — J189 Pneumonia, unspecified organism: Secondary | ICD-10-CM | POA: Diagnosis not present

## 2024-06-27 ENCOUNTER — Telehealth: Payer: Self-pay | Admitting: Pulmonary Disease

## 2024-06-27 ENCOUNTER — Encounter: Payer: Self-pay | Admitting: Pulmonary Disease

## 2024-06-27 ENCOUNTER — Ambulatory Visit: Payer: Medicare (Managed Care)

## 2024-06-27 ENCOUNTER — Ambulatory Visit: Payer: Medicare (Managed Care) | Admitting: Pulmonary Disease

## 2024-06-27 VITALS — BP 152/55 | HR 46 | Ht 67.0 in | Wt 160.0 lb

## 2024-06-27 DIAGNOSIS — R634 Abnormal weight loss: Secondary | ICD-10-CM

## 2024-06-27 DIAGNOSIS — Z87891 Personal history of nicotine dependence: Secondary | ICD-10-CM

## 2024-06-27 DIAGNOSIS — I509 Heart failure, unspecified: Secondary | ICD-10-CM | POA: Diagnosis not present

## 2024-06-27 DIAGNOSIS — J90 Pleural effusion, not elsewhere classified: Secondary | ICD-10-CM | POA: Diagnosis not present

## 2024-06-27 DIAGNOSIS — K219 Gastro-esophageal reflux disease without esophagitis: Secondary | ICD-10-CM

## 2024-06-27 DIAGNOSIS — R600 Localized edema: Secondary | ICD-10-CM

## 2024-06-27 NOTE — Progress Notes (Unsigned)
 Subjective:   PATIENT ID: Jackson [name] GENDER: male DOB: [birth year], MRN: [id number]   HPI Discussed the use of AI scribe software for clinical note transcription with the patient, who gave verbal consent to proceed.  History of Present Illness   Jackson [name] is a 89 year old male who presents with persistent pleural effusion. He is accompanied by his niece.  He has a history of pneumonia, with the most recent hospitalization in November of last year. Review of chest imaging shows pleural effusion forming after that hospitalization. He experiences shortness of breath when walking up a steep bank, but no current shortness of breath at rest.  He has a history of acid reflux and uses a hospital bed to keep his head elevated. He has history of recurrent pneumonia due to reflux/aspiration which has improved significantly since using hospital bed.  He is on Lasix  for hear tfailure but takes it about every other day to manage ankle swelling, which resolves overnight with elevation. He has experienced significant weight loss. He was taken off Trulicity two months ago due to weight loss concerns.  He quit smoking at the age of 20 and has not smoked since.    Past Medical History:  Diagnosis Date   Aortic stenosis    Echo 3/18: mild aortic stenosis (mean 12/peak 22 // Echo 3/19: mild AS (mean 12, peak 20) // Echo 02/2019: EF 60-65, grade 1 diastolic dysfunction, mild MAC, mild aortic stenosis (mean gradient 9), mild dilation of the ascending aorta (38 mm)    Atherosclerosis of aorta 12/16/2016   CXR 6/16: IMPRESSION: 1. Resolved left basilar airspace opacities. 2. Hazy nodularity in both lungs compatible with the patient's known pleural plaques. 3. Right anterior hemidiaphragmatic eventration. 4. Thoracic spondylosis. 5. Atherosclerotic calcification of the aortic arch.   Bradycardia    Low HR (30s) noted during sleep during admx for sepsis in 2016; Mobitz 1, 2:1 block >> no indication for  pacer; management of OSA recommended // transient CHB noted on ECG during admit in 10/2018    Diabetes mellitus without complication (HCC)    Diabetic neuropathy (HCC)    DJD (degenerative joint disease)    knee, hip   GERD (gastroesophageal reflux disease)    HLD (hyperlipidemia)    Hypertension    Hyperthyroidism    Lexiscan  Myoview  06/2018   Normal perfusion, EF 81; Low Risk   Mild diastolic dysfunction 11/18/2018   Noted on Echocardiogram; Patient does not require diuretic Rx   Phlebitis of left leg      Family History  Problem Relation Age of Onset   Diabetes Mother    Diabetes Sister    Diabetes Brother    Heart attack Neg Hx    Stroke Neg Hx    Hypertension Neg Hx      Social History   Socioeconomic History   Marital status: Married    Spouse name: Not on file   Number of children: Not on file   Years of education: Not on file   Highest education level: Not on file  Occupational History   Occupation: retired  Tobacco Use   Smoking status: Former    Current packs/day: 0.00    Types: Cigarettes    Quit date: 1960    Years since quitting: 65.8   Smokeless tobacco: Never  Vaping Use   Vaping status: Never Used  Substance and Sexual Activity   Alcohol  use: No   Drug use: No   Sexual activity:  Not on file  Other Topics Concern   Not on file  Social History Narrative   Not on file   Social Drivers of Health   Financial Resource Strain: Not on file  Food Insecurity: No Food Insecurity (07/29/2023)   Hunger Vital Sign    Worried About Running Out of Food in the Last Year: Never true    Ran Out of Food in the Last Year: Never true  Transportation Needs: No Transportation Needs (07/29/2023)   PRAPARE - Administrator, Civil Service (Medical): No    Lack of Transportation (Non-Medical): No  Physical Activity: Not on file  Stress: Not on file  Social Connections: Not on file  Intimate Partner Violence: Not At Risk (07/29/2023)   Humiliation,  Afraid, Rape, and Kick questionnaire    Fear of Current or Ex-Partner: No    Emotionally Abused: No    Physically Abused: No    Sexually Abused: No     Allergies  Allergen Reactions   Actos [Pioglitazone] Swelling   Formaldehyde Other (See Comments)    Flu-like symptoms   Lipitor [Atorvastatin Calcium ] Other (See Comments)    myalgia   Metformin And Related Diarrhea    unknown     Outpatient Medications Prior to Visit  Medication Sig Dispense Refill   acetaminophen  (TYLENOL ) 500 MG tablet Take 1,000 mg by mouth every 8 (eight) hours as needed for mild pain (pain score 1-3) or headache.     aspirin  EC 81 MG tablet Take 1 tablet (81 mg total) by mouth daily.     benzocaine (ORAJEL) 10 % mucosal gel Use as directed 1 Application in the mouth or throat as needed for mouth pain.     Calcium  Carbonate-Vitamin D  (CALCIUM  600+D PO) Take 1 tablet by mouth daily.     famotidine  (PEPCID ) 20 MG tablet Take 20 mg by mouth at bedtime as needed for heartburn or indigestion.     gabapentin  (NEURONTIN ) 100 MG capsule Take 300 mg by mouth 3 (three) times daily.     Insulin  Glargine (LANTUS ) 100 UNIT/ML Solostar Pen Inject 10 Units into the skin at bedtime.     insulin  lispro (HUMALOG  KWIKPEN) 100 UNIT/ML KwikPen Before each meal, inject subcutaneously 3 times a day:  140-199 - 2 units, 200-250 - 4 units, 251-299 - 6 units,  300-349 - 8 units,  350 or above 10 units. 15 mL 0   Insulin  Pen Needle 31G X 8 MM MISC Use 1 each 3 (three) times daily. 100 each 0   omeprazole (PRILOSEC) 20 MG capsule Take 20 mg by mouth daily.     rosuvastatin  (CRESTOR ) 10 MG tablet TAKE 1 TABLET AT BEDTIME 90 tablet 1   tamsulosin  (FLOMAX ) 0.4 MG CAPS capsule Take 1 capsule (0.4 mg total) by mouth daily after supper. 30 capsule 0   Zinc 30 MG TABS Take 30 mg by mouth daily.     amoxicillin -clavulanate (AUGMENTIN ) 875-125 MG tablet Take 1 tablet by mouth 2 (two) times daily. (Patient not taking: Reported on 06/27/2024) 8 tablet  0   carboxymethylcellulose (REFRESH PLUS) 0.5 % SOLN Place 1 drop into both eyes as needed (dry eyes). (Patient not taking: Reported on 06/27/2024)     fluticasone  (FLONASE ) 50 MCG/ACT nasal spray Place 2 sprays into both nostrils 2 (two) times daily. (Patient not taking: Reported on 06/27/2024) 16 g 0   ipratropium-albuterol  (DUONEB) 0.5-2.5 (3) MG/3ML SOLN Use twice a day scheduled and every 4 hours as needed for  shortness of breath and wheezing (Patient not taking: Reported on 06/27/2024) 360 mL 0   lisinopril  (PRINIVIL ,ZESTRIL ) 5 MG tablet Take 1 tablet (5 mg total) by mouth daily. (Patient not taking: Reported on 06/27/2024) 90 tablet 3   tetrahydrozoline-zinc (VISINE-AC) 0.05-0.25 % ophthalmic solution Place 2 drops into both eyes 3 (three) times daily as needed (dry eyes). (Patient not taking: Reported on 06/27/2024)     TRULICITY 0.75 MG/0.5ML SOPN Inject 0.75 mg into the skin once a week. Inject on Monday (Patient not taking: Reported on 06/27/2024)     No facility-administered medications prior to visit.    Review of Systems  Constitutional:  Negative for chills, fever, malaise/fatigue and weight loss.  HENT:  Negative for congestion, sinus pain and sore throat.   Eyes: Negative.   Respiratory:  Positive for shortness of breath. Negative for cough, hemoptysis, sputum production and wheezing.   Cardiovascular:  Negative for chest pain, palpitations, orthopnea, claudication and leg swelling.  Gastrointestinal:  Negative for abdominal pain, heartburn, nausea and vomiting.  Genitourinary: Negative.   Musculoskeletal:  Negative for joint pain and myalgias.  Skin:  Negative for rash.  Neurological:  Negative for weakness.  Endo/Heme/Allergies: Negative.   Psychiatric/Behavioral: Negative.        Objective:   Vitals:   06/27/24 1335  BP: (!) 152/55  Pulse: (!) 46  SpO2: 95%  Weight: 160 lb (72.6 kg)  Height: 5' 7 (1.702 m)     Physical Exam Constitutional:      General: He is  not in acute distress.    Appearance: Normal appearance.  Eyes:     General: No scleral icterus.    Conjunctiva/sclera: Conjunctivae normal.  Cardiovascular:     Rate and Rhythm: Normal rate and regular rhythm.  Pulmonary:     Breath sounds: Examination of the right-lower field reveals decreased breath sounds. Decreased breath sounds present. No wheezing, rhonchi or rales.  Musculoskeletal:     Right lower leg: No edema.     Left lower leg: No edema.  Skin:    General: Skin is warm and dry.  Neurological:     General: No focal deficit present.       CBC    Component Value Date/Time   WBC 8.1 07/30/2023 0407   RBC 3.55 (L) 07/30/2023 0407   HGB 10.9 (L) 07/30/2023 0407   HCT 32.6 (L) 07/30/2023 0407   PLT 212 07/30/2023 0407   MCV 91.8 07/30/2023 0407   MCH 30.7 07/30/2023 0407   MCHC 33.4 07/30/2023 0407   RDW 12.4 07/30/2023 0407   LYMPHSABS 1.2 07/30/2023 0407   MONOABS 1.1 (H) 07/30/2023 0407   EOSABS 0.5 07/30/2023 0407   BASOSABS 0.0 07/30/2023 0407      Latest Ref Rng & Units 07/30/2023    4:07 AM 07/29/2023    5:06 AM 07/28/2023    3:59 AM  BMP  Glucose 70 - 99 mg/dL 819  832  886   BUN 8 - 23 mg/dL 16  16  18    Creatinine 0.61 - 1.24 mg/dL 8.82  8.79  8.73   Sodium 135 - 145 mmol/L 135  134  137   Potassium 3.5 - 5.1 mmol/L 3.8  3.8  4.3   Chloride 98 - 111 mmol/L 99  98  100   CO2 22 - 32 mmol/L 28  27  31    Calcium  8.9 - 10.3 mg/dL 9.1  9.1  9.1    Chest imaging: CXR 06/27/24 Right pleural  effusion with atelectasis  PFT:     No data to display          Labs:  Path:  Echo:  Heart Catheterization:    Assessment & Plan:   Pleural effusion on right - Plan: DG Chest 2 View Assessment and Plan    Right-sided pleural effusion Chronic effusion since last November, likely related to previous pneumonia vs heart failure. Ultrasound suggests simple effusion. Discussed thoracentesis with him and family; he opted for procedure. - Schedule  thoracentesis on October 16th for fluid drainage and analysis including inflammatory markers, cultures, and pathology. - Order chest x-ray today and post-procedure to assess fluid levels.  Heart failure with peripheral edema Peripheral edema likely due to heart failure. Non-compliance with Lasix  noted. Discussed importance of regular use to manage fluid retention. - Encourage regular Lasix  use as prescribed.  Unintentional weight loss - continue to monitor  Gastroesophageal reflux disease (GERD) GERD managed with lifestyle changes. Aspiration from GERD contributed to past pneumonia. Non-compliance with dietary recommendations noted. - Continue lifestyle modifications and dietary changes.   Follow up in 1 month  Dorn Chill, MD [facility] Office: [phone]   Current Outpatient Medications:    acetaminophen  (TYLENOL ) 500 MG tablet, Take 1,000 mg by mouth every 8 (eight) hours as needed for mild pain (pain score 1-3) or headache., Disp: , Rfl:    aspirin  EC 81 MG tablet, Take 1 tablet (81 mg total) by mouth daily., Disp: , Rfl:    benzocaine (ORAJEL) 10 % mucosal gel, Use as directed 1 Application in the mouth or throat as needed for mouth pain., Disp: , Rfl:    Calcium  Carbonate-Vitamin D  (CALCIUM  600+D PO), Take 1 tablet by mouth daily., Disp: , Rfl:    famotidine  (PEPCID ) 20 MG tablet, Take 20 mg by mouth at bedtime as needed for heartburn or indigestion., Disp: , Rfl:    gabapentin  (NEURONTIN ) 100 MG capsule, Take 300 mg by mouth 3 (three) times daily., Disp: , Rfl:    Insulin  Glargine (LANTUS ) 100 UNIT/ML Solostar Pen, Inject 10 Units into the skin at bedtime., Disp: , Rfl:    insulin  lispro (HUMALOG  KWIKPEN) 100 UNIT/ML KwikPen, Before each meal, inject subcutaneously 3 times a day:  140-199 - 2 units, 200-250 - 4 units, 251-299 - 6 units,  300-349 - 8 units,  350 or above 10 units., Disp: 15 mL, Rfl: 0   Insulin  Pen Needle 31G X 8 MM MISC, Use 1 each 3  (three) times daily., Disp: 100 each, Rfl: 0   omeprazole (PRILOSEC) 20 MG capsule, Take 20 mg by mouth daily., Disp: , Rfl:    rosuvastatin  (CRESTOR ) 10 MG tablet, TAKE 1 TABLET AT BEDTIME, Disp: 90 tablet, Rfl: 1   tamsulosin  (FLOMAX ) 0.4 MG CAPS capsule, Take 1 capsule (0.4 mg total) by mouth daily after supper., Disp: 30 capsule, Rfl: 0   Zinc 30 MG TABS, Take 30 mg by mouth daily., Disp: , Rfl:    amoxicillin -clavulanate (AUGMENTIN ) 875-125 MG tablet, Take 1 tablet by mouth 2 (two) times daily. (Patient not taking: Reported on 06/27/2024), Disp: 8 tablet, Rfl: 0   carboxymethylcellulose (REFRESH PLUS) 0.5 % SOLN, Place 1 drop into both eyes as needed (dry eyes). (Patient not taking: Reported on 06/27/2024), Disp: , Rfl:    fluticasone  (FLONASE ) 50 MCG/ACT nasal spray, Place 2 sprays into both nostrils 2 (two) times daily. (Patient not taking: Reported on 06/27/2024), Disp: 16 g, Rfl: 0   ipratropium-albuterol  (DUONEB) 0.5-2.5 (  3) MG/3ML SOLN, Use twice a day scheduled and every 4 hours as needed for shortness of breath and wheezing (Patient not taking: Reported on 06/27/2024), Disp: 360 mL, Rfl: 0   lisinopril  (PRINIVIL ,ZESTRIL ) 5 MG tablet, Take 1 tablet (5 mg total) by mouth daily. (Patient not taking: Reported on 06/27/2024), Disp: 90 tablet, Rfl: 3   tetrahydrozoline-zinc (VISINE-AC) 0.05-0.25 % ophthalmic solution, Place 2 drops into both eyes 3 (three) times daily as needed (dry eyes). (Patient not taking: Reported on 06/27/2024), Disp: , Rfl:    TRULICITY 0.75 MG/0.5ML SOPN, Inject 0.75 mg into the skin once a week. Inject on Monday (Patient not taking: Reported on 06/27/2024), Disp: , Rfl:

## 2024-06-27 NOTE — Patient Instructions (Addendum)
 We will schedule you for a thoracentesis at the hospital in our endoscopy suites  We will send that pleural fluid for further analysis  We will check a chest x-ray today  We will plan to schedule you for a thoracentesis on 10/16  Follow up in 1 month

## 2024-06-27 NOTE — Telephone Encounter (Signed)
 Please schedule patient for thoracentesis at [facility] END[facility] on 10/16 in the afternoon. He can hold his aspirin  2 days before the procedure.  Thanks, JD

## 2024-06-28 ENCOUNTER — Encounter: Payer: Self-pay | Admitting: Pulmonary Disease

## 2024-06-28 NOTE — Telephone Encounter (Signed)
 This was scheduled not sure it the patient has been contacted yet will forward to [place] to contact

## 2024-06-29 NOTE — Telephone Encounter (Signed)
 Seems like [facility] endo scheduled the patient. They were already aware of the appointment. Patient will make sure to not take aspirin  2 days prior. NFN

## 2024-07-06 ENCOUNTER — Ambulatory Visit (HOSPITAL_COMMUNITY): Payer: Medicare (Managed Care)

## 2024-07-06 ENCOUNTER — Ambulatory Visit (HOSPITAL_COMMUNITY)
Admission: RE | Admit: 2024-07-06 | Discharge: 2024-07-06 | Disposition: A | Discharge status: Home / Self Care | Payer: Medicare (Managed Care) | Attending: Pulmonary Disease | Admitting: Pulmonary Disease

## 2024-07-06 ENCOUNTER — Encounter (HOSPITAL_COMMUNITY)
Admission: RE | Disposition: A | Discharge status: Home / Self Care | Payer: Self-pay | Source: Home / Self Care | Attending: Pulmonary Disease

## 2024-07-06 ENCOUNTER — Other Ambulatory Visit: Payer: Self-pay

## 2024-07-06 DIAGNOSIS — M79674 Pain in right toe(s): Secondary | ICD-10-CM | POA: Insufficient documentation

## 2024-07-06 DIAGNOSIS — J90 Pleural effusion, not elsewhere classified: Secondary | ICD-10-CM | POA: Diagnosis not present

## 2024-07-06 DIAGNOSIS — B351 Tinea unguium: Secondary | ICD-10-CM | POA: Diagnosis not present

## 2024-07-06 DIAGNOSIS — E1140 Type 2 diabetes mellitus with diabetic neuropathy, unspecified: Secondary | ICD-10-CM | POA: Insufficient documentation

## 2024-07-06 DIAGNOSIS — M79675 Pain in left toe(s): Secondary | ICD-10-CM | POA: Insufficient documentation

## 2024-07-06 HISTORY — PX: THORACENTESIS: SHX235

## 2024-07-06 LAB — BODY FLUID CELL COUNT WITH DIFFERENTIAL
Eos, Fluid: 0 %
Lymphs, Fluid: 99 %
Monocyte-Macrophage-Serous Fluid: 1 % — ABNORMAL LOW (ref 50–90)
Neutrophil Count, Fluid: 0 % (ref 0–25)
Total Nucleated Cell Count, Fluid: 660 uL (ref 0–1000)

## 2024-07-06 LAB — GLUCOSE, PLEURAL OR PERITONEAL FLUID: Glucose, Fluid: 275 mg/dL

## 2024-07-06 LAB — PROTEIN, PLEURAL OR PERITONEAL FLUID: Total protein, fluid: <3 g/dL

## 2024-07-06 LAB — LACTATE DEHYDROGENASE, PLEURAL OR PERITONEAL FLUID: LD, Fluid: 83 U/L — ABNORMAL HIGH (ref 3–23)

## 2024-07-06 LAB — ALBUMIN, PLEURAL OR PERITONEAL FLUID: Albumin, Fluid: <1.5 g/dL

## 2024-07-06 SURGERY — THORACENTESIS
Anesthesia: LOCAL | Laterality: Right

## 2024-07-06 NOTE — Op Note (Signed)
 Thoracentesis  Procedure Note  SABASTIEN TYLER  [id number]  02-20-1929  Date:07/06/24  Time:3:03 PM   Provider Performing:Theodoro Koval B Jeret Goyer   Procedure: Thoracentesis with imaging guidance (67444)  Indication(s) Pleural Effusion  Consent Risks of the procedure as well as the alternatives and risks of each were explained to the patient and/or caregiver.  Consent for the procedure was obtained and is signed in the bedside chart  Anesthesia Topical only with 1% lidocaine    Time Out Verified patient identification, verified procedure, site/side was marked, verified correct patient position, special equipment/implants available, medications/allergies/relevant history reviewed, required imaging and test results available.   Sterile Technique Maximal sterile technique including full sterile barrier drape, hand hygiene, sterile gown, sterile gloves, mask, hair covering, sterile ultrasound probe cover (if used).  Procedure Description Ultrasound was used to identify appropriate pleural anatomy for placement and overlying skin marked.  Area of drainage cleaned and draped in sterile fashion. Lidocaine was used to anesthetize the skin and subcutaneous tissue.  500 cc's of straw colored appearing fluid was drained from the right pleural space. Catheter then removed and bandaid applied to site.   Complications/Tolerance None; patient tolerated the procedure well. Chest X-ray is ordered to confirm no post-procedural complication.   EBL Minimal   Specimen(s) Pleural fluid

## 2024-07-06 NOTE — Interval H&P Note (Signed)
 History and Physical Interval Note:  07/06/2024 2:21 PM  Jackson [name]  has presented today for surgery, with the diagnosis of Right pleural effusion.  The various methods of treatment have been discussed with the patient and family. After consideration of risks, benefits and other options for treatment, the patient has consented to  Procedure(s): THORACENTESIS (Right) as a surgical intervention.  The patient's history has been reviewed, patient examined, no change in status, stable for surgery.  I have reviewed the patient's chart and labs.  Questions were answered to the patient's satisfaction.     Dorn KATHEE Chill

## 2024-07-07 LAB — TRIGLYCERIDES, BODY FLUIDS: Triglycerides, Fluid: 10 mg/dL

## 2024-07-09 ENCOUNTER — Encounter (HOSPITAL_COMMUNITY): Payer: Self-pay | Admitting: Pulmonary Disease

## 2024-07-10 LAB — BODY FLUID CULTURE W GRAM STAIN: Culture: NO GROWTH

## 2024-07-11 DIAGNOSIS — R600 Localized edema: Secondary | ICD-10-CM | POA: Diagnosis not present

## 2024-07-11 DIAGNOSIS — J90 Pleural effusion, not elsewhere classified: Secondary | ICD-10-CM | POA: Diagnosis not present

## 2024-07-11 DIAGNOSIS — I10 Essential (primary) hypertension: Secondary | ICD-10-CM | POA: Diagnosis not present

## 2024-07-11 DIAGNOSIS — I5189 Other ill-defined heart diseases: Secondary | ICD-10-CM | POA: Diagnosis not present

## 2024-07-11 DIAGNOSIS — Z794 Long term (current) use of insulin: Secondary | ICD-10-CM | POA: Diagnosis not present

## 2024-07-11 DIAGNOSIS — N1831 Chronic kidney disease, stage 3a: Secondary | ICD-10-CM | POA: Diagnosis not present

## 2024-07-11 DIAGNOSIS — E113412 Type 2 diabetes mellitus with severe nonproliferative diabetic retinopathy with macular edema, left eye: Secondary | ICD-10-CM | POA: Diagnosis not present

## 2024-07-11 DIAGNOSIS — E1165 Type 2 diabetes mellitus with hyperglycemia: Secondary | ICD-10-CM | POA: Diagnosis not present

## 2024-07-11 LAB — CYTOLOGY - NON PAP

## 2024-07-23 DIAGNOSIS — J449 Chronic obstructive pulmonary disease, unspecified: Secondary | ICD-10-CM | POA: Diagnosis not present

## 2024-07-23 DIAGNOSIS — J189 Pneumonia, unspecified organism: Secondary | ICD-10-CM | POA: Diagnosis not present

## 2024-08-08 LAB — FUNGUS CULTURE WITH STAIN

## 2024-08-08 LAB — FUNGUS CULTURE RESULT

## 2024-08-08 LAB — FUNGAL ORGANISM REFLEX

## 2024-08-16 ENCOUNTER — Ambulatory Visit: Payer: Medicare (Managed Care)

## 2024-08-16 ENCOUNTER — Encounter: Payer: Self-pay | Admitting: Pulmonary Disease

## 2024-08-16 ENCOUNTER — Ambulatory Visit: Payer: Medicare (Managed Care) | Admitting: Pulmonary Disease

## 2024-08-16 VITALS — BP 107/60 | HR 47 | Ht 67.0 in | Wt 149.0 lb

## 2024-08-16 DIAGNOSIS — R634 Abnormal weight loss: Secondary | ICD-10-CM | POA: Diagnosis not present

## 2024-08-16 DIAGNOSIS — J90 Pleural effusion, not elsewhere classified: Secondary | ICD-10-CM | POA: Diagnosis not present

## 2024-08-16 NOTE — Patient Instructions (Addendum)
 We will check a chest x-ray today  Your pleural effusion seems to be coming from your heart failure  Follow up as needed

## 2024-08-16 NOTE — Progress Notes (Signed)
 Established Patient Pulmonology Office Visit   Subjective:  Patient ID: Jackson [name], male    DOB: 09/30/1928  MRN: [id number]  CC:  Chief Complaint  Patient presents with   Medical Management of Chronic Issues    Pt states all has been well / wants you to look at his ankles so he won't have to take lasix  anymore     Discussed the use of AI scribe software for clinical note transcription with the patient, who gave verbal consent to proceed.  History of Present Illness Jackson [name] is a 89 year old male with pleural effusion who presents for follow-up after fluid drainage.  He feels better since the effusion was drained, with improved breathing, less abdominal pressure, and no current shortness of breath or panic symptoms.  He is active with walking, including a hill near his home, to test his breathing. He is able to reach the top and recover with heavy breathing only. Activity is limited in cold weather.  He takes Lasix  every other day, three times a week, and stays near a bathroom due to frequent urination. He has lost about eleven pounds since October, which he attributes to [place], and is concerned about this weight loss.  He spends much of the day in a recliner but tries to walk when able.  Pleural fluid studies showed transudative effusion with lymphocyte predominance.       ROS    Current Outpatient Medications:    furosemide  (LASIX ) 8 MG/ML solution, Take 20 mg by mouth daily., Disp: , Rfl:    hydrALAZINE  (APRESOLINE ) 25 MG tablet, 1 tablet with food Orally 3 times a day, Disp: , Rfl:    JARDIANCE 10 MG TABS tablet, 1 tablet Orally Once a day; Duration: 90 days, Disp: , Rfl:    acetaminophen  (TYLENOL ) 500 MG tablet, Take 1,000 mg by mouth every 8 (eight) hours as needed for mild pain (pain score 1-3) or headache., Disp: , Rfl:    amoxicillin -clavulanate (AUGMENTIN ) 875-125 MG tablet, Take 1 tablet by mouth 2 (two) times daily. (Patient not taking: Reported  on 06/27/2024), Disp: 8 tablet, Rfl: 0   aspirin  EC 81 MG tablet, Take 1 tablet (81 mg total) by mouth daily., Disp: , Rfl:    benzocaine (ORAJEL) 10 % mucosal gel, Use as directed 1 Application in the mouth or throat as needed for mouth pain., Disp: , Rfl:    Calcium  Carbonate-Vitamin D  (CALCIUM  600+D PO), Take 1 tablet by mouth daily., Disp: , Rfl:    carboxymethylcellulose (REFRESH PLUS) 0.5 % SOLN, Place 1 drop into both eyes as needed (dry eyes). (Patient not taking: Reported on 06/27/2024), Disp: , Rfl:    famotidine  (PEPCID ) 20 MG tablet, Take 20 mg by mouth at bedtime as needed for heartburn or indigestion. (Patient not taking: Reported on 08/16/2024), Disp: , Rfl:    fluticasone  (FLONASE ) 50 MCG/ACT nasal spray, Place 2 sprays into both nostrils 2 (two) times daily. (Patient not taking: Reported on 06/27/2024), Disp: 16 g, Rfl: 0   gabapentin  (NEURONTIN ) 100 MG capsule, Take 300 mg by mouth 3 (three) times daily., Disp: , Rfl:    hydrochlorothiazide (HYDRODIURIL) 12.5 MG tablet, Take 12.5 mg by mouth every morning., Disp: , Rfl:    Insulin  Glargine (LANTUS ) 100 UNIT/ML Solostar Pen, Inject 10 Units into the skin at bedtime., Disp: , Rfl:    insulin  lispro (HUMALOG  KWIKPEN) 100 UNIT/ML KwikPen, Before each meal, inject subcutaneously 3 times a day:  140-199 - 2  units, 200-250 - 4 units, 251-299 - 6 units,  300-349 - 8 units,  350 or above 10 units., Disp: 15 mL, Rfl: 0   Insulin  Pen Needle 31G X 8 MM MISC, Use 1 each 3 (three) times daily., Disp: 100 each, Rfl: 0   ipratropium-albuterol  (DUONEB) 0.5-2.5 (3) MG/3ML SOLN, Use twice a day scheduled and every 4 hours as needed for shortness of breath and wheezing (Patient not taking: Reported on 06/27/2024), Disp: 360 mL, Rfl: 0   lisinopril  (PRINIVIL ,ZESTRIL ) 5 MG tablet, Take 1 tablet (5 mg total) by mouth daily. (Patient not taking: Reported on 06/27/2024), Disp: 90 tablet, Rfl: 3   omeprazole (PRILOSEC) 20 MG capsule, Take 20 mg by mouth daily.,  Disp: , Rfl:    rosuvastatin  (CRESTOR ) 10 MG tablet, TAKE 1 TABLET AT BEDTIME, Disp: 90 tablet, Rfl: 1   tamsulosin  (FLOMAX ) 0.4 MG CAPS capsule, Take 1 capsule (0.4 mg total) by mouth daily after supper., Disp: 30 capsule, Rfl: 0   tetrahydrozoline-zinc (VISINE-AC) 0.05-0.25 % ophthalmic solution, Place 2 drops into both eyes 3 (three) times daily as needed (dry eyes). (Patient not taking: Reported on 06/27/2024), Disp: , Rfl:    TRULICITY 0.75 MG/0.5ML SOPN, Inject 0.75 mg into the skin once a week. Inject on Monday (Patient not taking: Reported on 06/27/2024), Disp: , Rfl:    Zinc 30 MG TABS, Take 30 mg by mouth daily., Disp: , Rfl:       Objective:  BP 107/60   Pulse (!) 47   Ht 5' 7 (1.702 m) Comment: per pt  Wt 149 lb (67.6 kg)   SpO2 96%   BMI 23.34 kg/m   Wt Readings from Last 3 Encounters:  08/16/24 149 lb (67.6 kg)  07/06/24 160 lb 0.9 oz (72.6 kg)  06/27/24 160 lb (72.6 kg)    Physical Exam Constitutional:      General: He is not in acute distress.    Appearance: Normal appearance.  Eyes:     General: No scleral icterus.    Conjunctiva/sclera: Conjunctivae normal.  Cardiovascular:     Rate and Rhythm: Normal rate and regular rhythm.  Pulmonary:     Breath sounds: Examination of the right-lower field reveals decreased breath sounds. Decreased breath sounds present. No wheezing, rhonchi or rales.  Musculoskeletal:     Right lower leg: No edema.     Left lower leg: No edema.  Skin:    General: Skin is warm and dry.  Neurological:     General: No focal deficit present.      Diagnostic Review:  Last CBC Lab Results  Component Value Date   WBC 8.1 07/30/2023   HGB 10.9 (L) 07/30/2023   HCT 32.6 (L) 07/30/2023   MCV 91.8 07/30/2023   MCH 30.7 07/30/2023   RDW 12.4 07/30/2023   PLT 212 07/30/2023   Last metabolic panel Lab Results  Component Value Date   GLUCOSE 180 (H) 07/30/2023   NA 135 07/30/2023   K 3.8 07/30/2023   CL 99 07/30/2023   CO2 28  07/30/2023   BUN 16 07/30/2023   CREATININE 1.17 07/30/2023   GFRNONAA 58 (L) 07/30/2023   CALCIUM  9.1 07/30/2023   PHOS 3.1 07/30/2023   PROT 5.9 (L) 03/10/2022   ALBUMIN 2.6 (L) 03/10/2022   BILITOT 0.5 03/10/2022   ALKPHOS 95 03/10/2022   AST 26 03/10/2022   ALT 29 03/10/2022   ANIONGAP 8 07/30/2023       Assessment & Plan:   Assessment &  Plan Pleural effusion on right  Orders:   DG Chest 2 View; Future   Assessment and Plan Assessment & Plan Pleural effusion likely secondary to heart failure Lymphocytic transudative effusion based on pleural fluid studies. Chronic since last year. Breathing improved post-drainage. - Ordered chest x-ray to monitor fluid status. - Continue Lasix  as prescribed. - Follow up if breathing worsens or fluid accumulation suspected.  Abnormal weight loss 11-pound weight loss since October, possibly related to Jardiance or fluid loss from heart failure management. No cancer cells in fluid analysis, but malignancy not ruled out. - Discuss weight loss and Jardiance use with cardiologist. - Monitor weight and symptoms.      Return if symptoms worsen or fail to improve.   Dorn KATHEE Chill, MD

## 2024-08-16 NOTE — Assessment & Plan Note (Addendum)
  Orders:   DG Chest 2 View; Future

## 2024-08-21 ENCOUNTER — Telehealth: Payer: Self-pay | Admitting: Pulmonary Disease

## 2024-08-21 ENCOUNTER — Encounter: Payer: Self-pay | Admitting: Pulmonary Disease

## 2024-08-21 NOTE — Telephone Encounter (Signed)
 Copied from CRM #[id number]. Topic: General - Other >> Aug 21, 2024  8:41 AM Whitney O wrote: Reason for CRM: ms diane is needing to speak with the xray tech. Calling cal to see if someone is available . No one was available  [phone]   Ms diane is requesting that the xray tech send the patients images to pacs chest xray dated 11/26

## 2024-08-22 ENCOUNTER — Ambulatory Visit: Payer: Medicare (Managed Care) | Admitting: Podiatry

## 2024-08-22 DIAGNOSIS — M79676 Pain in unspecified toe(s): Secondary | ICD-10-CM

## 2024-08-22 DIAGNOSIS — J189 Pneumonia, unspecified organism: Secondary | ICD-10-CM | POA: Diagnosis not present

## 2024-08-22 DIAGNOSIS — Z794 Long term (current) use of insulin: Secondary | ICD-10-CM | POA: Diagnosis not present

## 2024-08-22 DIAGNOSIS — B351 Tinea unguium: Secondary | ICD-10-CM

## 2024-08-22 DIAGNOSIS — E119 Type 2 diabetes mellitus without complications: Secondary | ICD-10-CM

## 2024-08-22 DIAGNOSIS — Z0189 Encounter for other specified special examinations: Secondary | ICD-10-CM

## 2024-08-27 ENCOUNTER — Encounter: Payer: Self-pay | Admitting: Podiatry

## 2024-08-27 NOTE — Progress Notes (Signed)
  Subjective:  Patient ID: Jackson [name], male    DOB: 1929/08/24,  MRN: [id number]  Jackson [name] presents to clinic today for for annual diabetic foot examination and painful mycotic toenails of both feet that are difficult to trim. Pain interferes with daily activities and wearing enclosed shoe gear comfortably. He is accompanied by his niece on today's visit. Chief Complaint  Patient presents with   Nail Problem    Thick painful toenails, 9 week follow up    Diabetes    A1C 8.0   New problem(s): None.   PCP is Charlott Dorn LABOR, MD.LOV 07/11/24.  Allergies  Allergen Reactions   Actos [Pioglitazone] Swelling   Formaldehyde Other (See Comments)    Flu-like symptoms   Atorvastatin     Other Reaction(s): myalgias at 20 mg dose   Lipitor [Atorvastatin Calcium ] Other (See Comments)    myalgia   Metformin And Related Diarrhea    unknown    Review of Systems: Negative except as noted in the HPI.  Objective: No changes noted in today's physical examination. There were no vitals filed for this visit. Jackson [name] is a pleasant 89 y.o. male in NAD. AAO x 3.   Diabetic foot exam was performed with the following findings:   Vascular Examination: Capillary refill time immediate b/l. Palpable pedal pulses. Pedal hair present b/l. Pedal edema absent. No pain with calf compression b/l. Skin temperature gradient WNL b/l. No cyanosis or clubbing b/l. No ischemia or gangrene noted b/l.   Neurological Examination: Sensation grossly intact b/l with 10 gram monofilament.   Dermatological Examination: Pedal skin with normal turgor, texture and tone b/l.  No open wounds. No interdigital macerations.   Toenails 1-5 b/l thick, discolored, elongated with subungual debris and pain on dorsal palpation.   No corns, calluses, nor porokeratotic lesions.  Musculoskeletal Examination: Muscle strength 5/5 to all lower extremity muscle groups bilaterally. No pain, crepitus or joint  limitation noted with ROM bilateral LE. No gross bony deformities bilaterally.  Radiographs: None     Assessment/Plan: 1. Pain due to onychomycosis of toenail   2. Type 2 diabetes mellitus without complication, with long-term current use of insulin  (HCC)   3. Encounter for diabetic foot exam (HCC)     Diabetic foot examination performed today. All patient's and/or POA's questions/concerns addressed on today's visit. Toenails 1-5 b/l debrided in length and girth without incident. Continue foot and shoe inspections daily. Monitor blood glucose per PCP/Endocrinologist's recommendations. Continue soft, supportive shoe gear daily. Report any pedal injuries to medical professional. Call office if there are any questions/concerns. -Patient/POA to call should there be question/concern in the interim.   Return in about 3 months (around 11/20/2024).  Delon LITTIE Merlin, DPM      [place] LOCATION: [address], [place] [address]                   Office [phone]   [facility] LOCATION: [address] [place], [place] [address] Office [phone]

## 2024-09-11 DIAGNOSIS — R600 Localized edema: Secondary | ICD-10-CM | POA: Diagnosis not present

## 2024-09-11 DIAGNOSIS — N1831 Chronic kidney disease, stage 3a: Secondary | ICD-10-CM | POA: Diagnosis not present

## 2024-09-11 DIAGNOSIS — E1165 Type 2 diabetes mellitus with hyperglycemia: Secondary | ICD-10-CM | POA: Diagnosis not present

## 2024-09-11 DIAGNOSIS — J90 Pleural effusion, not elsewhere classified: Secondary | ICD-10-CM | POA: Diagnosis not present

## 2024-09-11 DIAGNOSIS — Z794 Long term (current) use of insulin: Secondary | ICD-10-CM | POA: Diagnosis not present

## 2024-09-11 DIAGNOSIS — E113412 Type 2 diabetes mellitus with severe nonproliferative diabetic retinopathy with macular edema, left eye: Secondary | ICD-10-CM | POA: Diagnosis not present

## 2024-09-11 DIAGNOSIS — I10 Essential (primary) hypertension: Secondary | ICD-10-CM | POA: Diagnosis not present

## 2024-09-11 DIAGNOSIS — I5189 Other ill-defined heart diseases: Secondary | ICD-10-CM | POA: Diagnosis not present

## 2024-11-29 ENCOUNTER — Ambulatory Visit: Payer: Medicare (Managed Care) | Admitting: Podiatry
